# Patient Record
Sex: Female | Born: 1980 | Race: Black or African American | Hispanic: No | Marital: Single | Smoking: Never smoker
Health system: Southern US, Community
[De-identification: ages and names within clinical notes are randomized; demographics above are authoritative.]

## PROBLEM LIST (undated history)

## (undated) DIAGNOSIS — M1990 Unspecified osteoarthritis, unspecified site: Secondary | ICD-10-CM

## (undated) DIAGNOSIS — B029 Zoster without complications: Secondary | ICD-10-CM

## (undated) DIAGNOSIS — F32A Depression, unspecified: Secondary | ICD-10-CM

## (undated) DIAGNOSIS — F411 Generalized anxiety disorder: Secondary | ICD-10-CM

## (undated) DIAGNOSIS — F329 Major depressive disorder, single episode, unspecified: Secondary | ICD-10-CM

## (undated) DIAGNOSIS — F419 Anxiety disorder, unspecified: Secondary | ICD-10-CM

## (undated) DIAGNOSIS — G709 Myoneural disorder, unspecified: Secondary | ICD-10-CM

## (undated) DIAGNOSIS — B20 Human immunodeficiency virus [HIV] disease: Secondary | ICD-10-CM

## (undated) DIAGNOSIS — E119 Type 2 diabetes mellitus without complications: Secondary | ICD-10-CM

## (undated) DIAGNOSIS — K219 Gastro-esophageal reflux disease without esophagitis: Secondary | ICD-10-CM

## (undated) DIAGNOSIS — F319 Bipolar disorder, unspecified: Secondary | ICD-10-CM

## (undated) DIAGNOSIS — J45909 Unspecified asthma, uncomplicated: Secondary | ICD-10-CM

## (undated) DIAGNOSIS — D649 Anemia, unspecified: Secondary | ICD-10-CM

## (undated) DIAGNOSIS — Z21 Asymptomatic human immunodeficiency virus [HIV] infection status: Secondary | ICD-10-CM

## (undated) DIAGNOSIS — A6009 Herpesviral infection of other urogenital tract: Secondary | ICD-10-CM

## (undated) DIAGNOSIS — T7840XA Allergy, unspecified, initial encounter: Secondary | ICD-10-CM

## (undated) DIAGNOSIS — R51 Headache: Secondary | ICD-10-CM

## (undated) HISTORY — DX: Anemia, unspecified: D64.9

## (undated) HISTORY — DX: Zoster without complications: B02.9

## (undated) HISTORY — DX: Allergy, unspecified, initial encounter: T78.40XA

## (undated) HISTORY — PX: COLON SURGERY: SHX602

## (undated) HISTORY — DX: Human immunodeficiency virus (HIV) disease: B20

## (undated) HISTORY — DX: Unspecified osteoarthritis, unspecified site: M19.90

## (undated) HISTORY — DX: Asymptomatic human immunodeficiency virus (hiv) infection status: Z21

---

## 2000-01-09 ENCOUNTER — Emergency Department (HOSPITAL_COMMUNITY): Admission: EM | Admit: 2000-01-09 | Discharge: 2000-01-09 | Payer: Self-pay

## 2000-01-22 ENCOUNTER — Emergency Department (HOSPITAL_COMMUNITY): Admission: EM | Admit: 2000-01-22 | Discharge: 2000-01-22 | Payer: Self-pay | Admitting: Emergency Medicine

## 2000-01-22 ENCOUNTER — Encounter: Payer: Self-pay | Admitting: Emergency Medicine

## 2000-03-14 ENCOUNTER — Emergency Department (HOSPITAL_COMMUNITY): Admission: EM | Admit: 2000-03-14 | Discharge: 2000-03-14 | Payer: Self-pay | Admitting: Emergency Medicine

## 2000-03-15 ENCOUNTER — Encounter: Payer: Self-pay | Admitting: Emergency Medicine

## 2000-05-24 ENCOUNTER — Emergency Department (HOSPITAL_COMMUNITY): Admission: EM | Admit: 2000-05-24 | Discharge: 2000-05-24 | Payer: Self-pay | Admitting: Emergency Medicine

## 2000-05-27 ENCOUNTER — Emergency Department (HOSPITAL_COMMUNITY): Admission: EM | Admit: 2000-05-27 | Discharge: 2000-05-27 | Payer: Self-pay | Admitting: Emergency Medicine

## 2000-06-06 ENCOUNTER — Emergency Department (HOSPITAL_COMMUNITY): Admission: EM | Admit: 2000-06-06 | Discharge: 2000-06-06 | Payer: Self-pay | Admitting: Emergency Medicine

## 2000-09-11 ENCOUNTER — Emergency Department (HOSPITAL_COMMUNITY): Admission: EM | Admit: 2000-09-11 | Discharge: 2000-09-11 | Payer: Self-pay | Admitting: *Deleted

## 2000-10-12 ENCOUNTER — Emergency Department (HOSPITAL_COMMUNITY): Admission: EM | Admit: 2000-10-12 | Discharge: 2000-10-12 | Payer: Self-pay | Admitting: Emergency Medicine

## 2001-01-23 ENCOUNTER — Emergency Department (HOSPITAL_COMMUNITY): Admission: EM | Admit: 2001-01-23 | Discharge: 2001-01-23 | Payer: Self-pay | Admitting: Emergency Medicine

## 2001-01-23 ENCOUNTER — Encounter: Payer: Self-pay | Admitting: *Deleted

## 2001-03-14 ENCOUNTER — Inpatient Hospital Stay (HOSPITAL_COMMUNITY): Admission: AD | Admit: 2001-03-14 | Discharge: 2001-03-14 | Payer: Self-pay | Admitting: Obstetrics and Gynecology

## 2001-03-14 ENCOUNTER — Encounter: Payer: Self-pay | Admitting: *Deleted

## 2001-04-03 ENCOUNTER — Inpatient Hospital Stay (HOSPITAL_COMMUNITY): Admission: AD | Admit: 2001-04-03 | Discharge: 2001-04-03 | Payer: Self-pay | Admitting: Obstetrics and Gynecology

## 2001-04-28 ENCOUNTER — Inpatient Hospital Stay (HOSPITAL_COMMUNITY): Admission: AD | Admit: 2001-04-28 | Discharge: 2001-04-28 | Payer: Self-pay | Admitting: *Deleted

## 2001-05-22 ENCOUNTER — Inpatient Hospital Stay (HOSPITAL_COMMUNITY): Admission: AD | Admit: 2001-05-22 | Discharge: 2001-05-22 | Payer: Self-pay | Admitting: Obstetrics and Gynecology

## 2001-08-01 ENCOUNTER — Inpatient Hospital Stay (HOSPITAL_COMMUNITY): Admission: AD | Admit: 2001-08-01 | Discharge: 2001-08-01 | Payer: Self-pay | Admitting: Obstetrics and Gynecology

## 2001-09-11 ENCOUNTER — Inpatient Hospital Stay (HOSPITAL_COMMUNITY): Admission: AD | Admit: 2001-09-11 | Discharge: 2001-09-15 | Payer: Self-pay | Admitting: Obstetrics and Gynecology

## 2001-10-30 ENCOUNTER — Ambulatory Visit (HOSPITAL_COMMUNITY): Admission: RE | Admit: 2001-10-30 | Discharge: 2001-10-30 | Payer: Self-pay | Admitting: Obstetrics and Gynecology

## 2001-10-30 ENCOUNTER — Encounter: Payer: Self-pay | Admitting: Obstetrics and Gynecology

## 2001-11-01 ENCOUNTER — Other Ambulatory Visit: Admission: RE | Admit: 2001-11-01 | Discharge: 2001-11-01 | Payer: Self-pay | Admitting: Obstetrics and Gynecology

## 2001-11-30 ENCOUNTER — Emergency Department (HOSPITAL_COMMUNITY): Admission: EM | Admit: 2001-11-30 | Discharge: 2001-11-30 | Payer: Self-pay | Admitting: Emergency Medicine

## 2001-12-12 ENCOUNTER — Inpatient Hospital Stay (HOSPITAL_COMMUNITY): Admission: AD | Admit: 2001-12-12 | Discharge: 2001-12-12 | Payer: Self-pay | Admitting: Obstetrics and Gynecology

## 2002-01-29 ENCOUNTER — Emergency Department (HOSPITAL_COMMUNITY): Admission: EM | Admit: 2002-01-29 | Discharge: 2002-01-29 | Payer: Self-pay | Admitting: Emergency Medicine

## 2002-04-07 ENCOUNTER — Emergency Department (HOSPITAL_COMMUNITY): Admission: EM | Admit: 2002-04-07 | Discharge: 2002-04-07 | Payer: Self-pay | Admitting: Emergency Medicine

## 2002-11-03 ENCOUNTER — Emergency Department (HOSPITAL_COMMUNITY): Admission: EM | Admit: 2002-11-03 | Discharge: 2002-11-04 | Payer: Self-pay | Admitting: Emergency Medicine

## 2003-09-10 ENCOUNTER — Emergency Department (HOSPITAL_COMMUNITY): Admission: EM | Admit: 2003-09-10 | Discharge: 2003-09-10 | Payer: Self-pay | Admitting: Emergency Medicine

## 2004-03-13 ENCOUNTER — Emergency Department (HOSPITAL_COMMUNITY): Admission: EM | Admit: 2004-03-13 | Discharge: 2004-03-13 | Payer: Self-pay | Admitting: Emergency Medicine

## 2004-04-10 ENCOUNTER — Emergency Department (HOSPITAL_COMMUNITY): Admission: EM | Admit: 2004-04-10 | Discharge: 2004-04-10 | Payer: Self-pay | Admitting: Emergency Medicine

## 2004-05-06 ENCOUNTER — Emergency Department (HOSPITAL_COMMUNITY): Admission: EM | Admit: 2004-05-06 | Discharge: 2004-05-06 | Payer: Self-pay | Admitting: Emergency Medicine

## 2004-07-13 ENCOUNTER — Emergency Department (HOSPITAL_COMMUNITY): Admission: EM | Admit: 2004-07-13 | Discharge: 2004-07-13 | Payer: Self-pay | Admitting: Family Medicine

## 2004-09-23 ENCOUNTER — Ambulatory Visit: Payer: Self-pay | Admitting: Family Medicine

## 2004-09-28 ENCOUNTER — Ambulatory Visit: Payer: Self-pay | Admitting: Family Medicine

## 2004-11-10 ENCOUNTER — Emergency Department (HOSPITAL_COMMUNITY): Admission: EM | Admit: 2004-11-10 | Discharge: 2004-11-10 | Payer: Self-pay | Admitting: *Deleted

## 2004-12-15 ENCOUNTER — Ambulatory Visit: Payer: Self-pay | Admitting: Family Medicine

## 2005-01-13 ENCOUNTER — Emergency Department (HOSPITAL_COMMUNITY): Admission: EM | Admit: 2005-01-13 | Discharge: 2005-01-13 | Payer: Self-pay | Admitting: Family Medicine

## 2005-02-25 ENCOUNTER — Ambulatory Visit: Payer: Self-pay | Admitting: Family Medicine

## 2005-03-04 ENCOUNTER — Emergency Department (HOSPITAL_COMMUNITY): Admission: EM | Admit: 2005-03-04 | Discharge: 2005-03-04 | Payer: Self-pay | Admitting: Emergency Medicine

## 2005-03-19 ENCOUNTER — Ambulatory Visit: Payer: Self-pay | Admitting: Family Medicine

## 2005-04-01 ENCOUNTER — Emergency Department (HOSPITAL_COMMUNITY): Admission: EM | Admit: 2005-04-01 | Discharge: 2005-04-01 | Payer: Self-pay | Admitting: Emergency Medicine

## 2005-05-24 ENCOUNTER — Emergency Department (HOSPITAL_COMMUNITY): Admission: EM | Admit: 2005-05-24 | Discharge: 2005-05-24 | Payer: Self-pay | Admitting: Emergency Medicine

## 2005-06-23 ENCOUNTER — Emergency Department (HOSPITAL_COMMUNITY): Admission: EM | Admit: 2005-06-23 | Discharge: 2005-06-23 | Payer: Self-pay | Admitting: Emergency Medicine

## 2005-10-23 ENCOUNTER — Emergency Department (HOSPITAL_COMMUNITY): Admission: EM | Admit: 2005-10-23 | Discharge: 2005-10-23 | Payer: Self-pay | Admitting: Family Medicine

## 2005-11-27 ENCOUNTER — Emergency Department (HOSPITAL_COMMUNITY): Admission: EM | Admit: 2005-11-27 | Discharge: 2005-11-27 | Payer: Self-pay | Admitting: Family Medicine

## 2006-02-23 ENCOUNTER — Emergency Department (HOSPITAL_COMMUNITY): Admission: EM | Admit: 2006-02-23 | Discharge: 2006-02-23 | Payer: Self-pay | Admitting: Family Medicine

## 2006-03-17 ENCOUNTER — Emergency Department (HOSPITAL_COMMUNITY): Admission: EM | Admit: 2006-03-17 | Discharge: 2006-03-17 | Payer: Self-pay | Admitting: Family Medicine

## 2006-06-30 ENCOUNTER — Emergency Department (HOSPITAL_COMMUNITY): Admission: EM | Admit: 2006-06-30 | Discharge: 2006-06-30 | Payer: Self-pay | Admitting: Family Medicine

## 2006-07-18 ENCOUNTER — Emergency Department (HOSPITAL_COMMUNITY): Admission: EM | Admit: 2006-07-18 | Discharge: 2006-07-18 | Payer: Self-pay

## 2006-09-13 ENCOUNTER — Emergency Department (HOSPITAL_COMMUNITY): Admission: EM | Admit: 2006-09-13 | Discharge: 2006-09-13 | Payer: Self-pay | Admitting: Family Medicine

## 2006-09-16 ENCOUNTER — Emergency Department (HOSPITAL_COMMUNITY): Admission: EM | Admit: 2006-09-16 | Discharge: 2006-09-17 | Payer: Self-pay | Admitting: Emergency Medicine

## 2006-10-11 ENCOUNTER — Emergency Department (HOSPITAL_COMMUNITY): Admission: EM | Admit: 2006-10-11 | Discharge: 2006-10-11 | Payer: Self-pay | Admitting: Family Medicine

## 2006-10-26 ENCOUNTER — Emergency Department (HOSPITAL_COMMUNITY): Admission: EM | Admit: 2006-10-26 | Discharge: 2006-10-26 | Payer: Self-pay | Admitting: Family Medicine

## 2006-11-10 ENCOUNTER — Encounter: Admission: RE | Admit: 2006-11-10 | Discharge: 2006-11-10 | Payer: Self-pay | Admitting: Internal Medicine

## 2006-11-10 ENCOUNTER — Ambulatory Visit: Payer: Self-pay | Admitting: Internal Medicine

## 2006-11-10 DIAGNOSIS — L509 Urticaria, unspecified: Secondary | ICD-10-CM | POA: Insufficient documentation

## 2006-11-10 DIAGNOSIS — B20 Human immunodeficiency virus [HIV] disease: Secondary | ICD-10-CM | POA: Insufficient documentation

## 2006-11-15 DIAGNOSIS — J45991 Cough variant asthma: Secondary | ICD-10-CM | POA: Insufficient documentation

## 2006-11-25 ENCOUNTER — Ambulatory Visit: Payer: Self-pay | Admitting: Internal Medicine

## 2006-11-25 DIAGNOSIS — J309 Allergic rhinitis, unspecified: Secondary | ICD-10-CM | POA: Insufficient documentation

## 2006-11-25 DIAGNOSIS — M549 Dorsalgia, unspecified: Secondary | ICD-10-CM | POA: Insufficient documentation

## 2006-11-25 DIAGNOSIS — J45909 Unspecified asthma, uncomplicated: Secondary | ICD-10-CM | POA: Insufficient documentation

## 2006-11-25 DIAGNOSIS — F329 Major depressive disorder, single episode, unspecified: Secondary | ICD-10-CM

## 2006-11-25 DIAGNOSIS — F32A Depression, unspecified: Secondary | ICD-10-CM | POA: Insufficient documentation

## 2006-11-25 DIAGNOSIS — J3089 Other allergic rhinitis: Secondary | ICD-10-CM | POA: Insufficient documentation

## 2006-11-30 ENCOUNTER — Telehealth: Payer: Self-pay | Admitting: Internal Medicine

## 2006-12-06 ENCOUNTER — Telehealth: Payer: Self-pay | Admitting: Internal Medicine

## 2006-12-07 ENCOUNTER — Ambulatory Visit: Payer: Self-pay | Admitting: Internal Medicine

## 2006-12-07 DIAGNOSIS — K649 Unspecified hemorrhoids: Secondary | ICD-10-CM | POA: Insufficient documentation

## 2006-12-12 ENCOUNTER — Emergency Department (HOSPITAL_COMMUNITY): Admission: EM | Admit: 2006-12-12 | Discharge: 2006-12-12 | Payer: Self-pay | Admitting: Family Medicine

## 2006-12-14 ENCOUNTER — Encounter: Payer: Self-pay | Admitting: Internal Medicine

## 2006-12-28 ENCOUNTER — Encounter: Admission: RE | Admit: 2006-12-28 | Discharge: 2006-12-28 | Payer: Self-pay | Admitting: Internal Medicine

## 2006-12-28 ENCOUNTER — Ambulatory Visit: Payer: Self-pay | Admitting: Internal Medicine

## 2006-12-29 ENCOUNTER — Emergency Department (HOSPITAL_COMMUNITY): Admission: EM | Admit: 2006-12-29 | Discharge: 2006-12-29 | Payer: Self-pay | Admitting: Family Medicine

## 2006-12-29 ENCOUNTER — Telehealth: Payer: Self-pay | Admitting: Internal Medicine

## 2007-01-04 ENCOUNTER — Ambulatory Visit: Payer: Self-pay | Admitting: Internal Medicine

## 2007-01-04 ENCOUNTER — Telehealth: Payer: Self-pay | Admitting: Internal Medicine

## 2007-01-04 DIAGNOSIS — J019 Acute sinusitis, unspecified: Secondary | ICD-10-CM | POA: Insufficient documentation

## 2007-01-23 ENCOUNTER — Telehealth: Payer: Self-pay

## 2007-01-24 ENCOUNTER — Ambulatory Visit: Payer: Self-pay | Admitting: Internal Medicine

## 2007-01-24 DIAGNOSIS — R350 Frequency of micturition: Secondary | ICD-10-CM | POA: Insufficient documentation

## 2007-01-24 LAB — CONVERTED CEMR LAB
Blood in Urine, dipstick: NEGATIVE
Glucose, Urine, Semiquant: NEGATIVE
Ketones, urine, test strip: NEGATIVE
Nitrite: NEGATIVE
Protein, U semiquant: NEGATIVE
Specific Gravity, Urine: 1.02
Urobilinogen, UA: 1
WBC Urine, dipstick: NEGATIVE
pH: 7.5

## 2007-01-25 ENCOUNTER — Telehealth: Payer: Self-pay | Admitting: Internal Medicine

## 2007-01-30 ENCOUNTER — Encounter: Payer: Self-pay | Admitting: Internal Medicine

## 2007-01-31 ENCOUNTER — Telehealth: Payer: Self-pay | Admitting: Internal Medicine

## 2007-02-01 ENCOUNTER — Telehealth: Payer: Self-pay | Admitting: Internal Medicine

## 2007-02-03 ENCOUNTER — Emergency Department (HOSPITAL_COMMUNITY): Admission: EM | Admit: 2007-02-03 | Discharge: 2007-02-03 | Payer: Self-pay | Admitting: Family Medicine

## 2007-02-07 ENCOUNTER — Emergency Department (HOSPITAL_COMMUNITY): Admission: EM | Admit: 2007-02-07 | Discharge: 2007-02-07 | Payer: Self-pay | Admitting: Emergency Medicine

## 2007-02-09 ENCOUNTER — Emergency Department (HOSPITAL_COMMUNITY): Admission: EM | Admit: 2007-02-09 | Discharge: 2007-02-09 | Payer: Self-pay | Admitting: Emergency Medicine

## 2007-02-21 ENCOUNTER — Telehealth: Payer: Self-pay | Admitting: Internal Medicine

## 2007-02-22 ENCOUNTER — Telehealth: Payer: Self-pay | Admitting: Internal Medicine

## 2007-03-06 ENCOUNTER — Telehealth: Payer: Self-pay | Admitting: Internal Medicine

## 2007-03-08 ENCOUNTER — Telehealth: Payer: Self-pay | Admitting: Internal Medicine

## 2007-03-12 ENCOUNTER — Emergency Department (HOSPITAL_COMMUNITY): Admission: EM | Admit: 2007-03-12 | Discharge: 2007-03-12 | Payer: Self-pay | Admitting: Emergency Medicine

## 2007-04-15 ENCOUNTER — Emergency Department (HOSPITAL_COMMUNITY): Admission: EM | Admit: 2007-04-15 | Discharge: 2007-04-15 | Payer: Self-pay | Admitting: Emergency Medicine

## 2007-07-12 ENCOUNTER — Emergency Department (HOSPITAL_COMMUNITY): Admission: EM | Admit: 2007-07-12 | Discharge: 2007-07-12 | Payer: Self-pay | Admitting: Family Medicine

## 2007-07-20 ENCOUNTER — Emergency Department (HOSPITAL_COMMUNITY): Admission: EM | Admit: 2007-07-20 | Discharge: 2007-07-20 | Payer: Self-pay | Admitting: Emergency Medicine

## 2007-08-14 ENCOUNTER — Encounter: Payer: Self-pay | Admitting: Internal Medicine

## 2007-08-16 ENCOUNTER — Encounter (INDEPENDENT_AMBULATORY_CARE_PROVIDER_SITE_OTHER): Payer: Self-pay | Admitting: *Deleted

## 2007-09-24 ENCOUNTER — Emergency Department (HOSPITAL_COMMUNITY): Admission: EM | Admit: 2007-09-24 | Discharge: 2007-09-24 | Payer: Self-pay | Admitting: Family Medicine

## 2007-09-24 ENCOUNTER — Other Ambulatory Visit: Payer: Self-pay | Admitting: Internal Medicine

## 2007-10-03 ENCOUNTER — Encounter (INDEPENDENT_AMBULATORY_CARE_PROVIDER_SITE_OTHER): Payer: Self-pay | Admitting: *Deleted

## 2007-10-09 ENCOUNTER — Emergency Department (HOSPITAL_COMMUNITY): Admission: EM | Admit: 2007-10-09 | Discharge: 2007-10-09 | Payer: Self-pay | Admitting: Emergency Medicine

## 2007-10-26 ENCOUNTER — Encounter: Payer: Self-pay | Admitting: Internal Medicine

## 2007-10-27 ENCOUNTER — Encounter: Payer: Self-pay | Admitting: Internal Medicine

## 2007-10-27 LAB — CONVERTED CEMR LAB: Pap Smear: ABNORMAL

## 2008-02-02 ENCOUNTER — Inpatient Hospital Stay (HOSPITAL_COMMUNITY): Admission: EM | Admit: 2008-02-02 | Discharge: 2008-02-08 | Payer: Self-pay | Admitting: Emergency Medicine

## 2008-02-04 ENCOUNTER — Ambulatory Visit: Payer: Self-pay | Admitting: Internal Medicine

## 2008-02-11 ENCOUNTER — Emergency Department (HOSPITAL_COMMUNITY): Admission: EM | Admit: 2008-02-11 | Discharge: 2008-02-12 | Payer: Self-pay | Admitting: Emergency Medicine

## 2008-02-14 ENCOUNTER — Encounter: Payer: Self-pay | Admitting: Internal Medicine

## 2008-03-01 ENCOUNTER — Ambulatory Visit: Payer: Self-pay | Admitting: Internal Medicine

## 2008-03-04 ENCOUNTER — Ambulatory Visit: Payer: Self-pay | Admitting: Internal Medicine

## 2008-03-04 ENCOUNTER — Encounter: Admission: RE | Admit: 2008-03-04 | Discharge: 2008-03-04 | Payer: Self-pay | Admitting: Internal Medicine

## 2008-03-04 LAB — CONVERTED CEMR LAB
ALT: 19 units/L (ref 0–35)
AST: 20 units/L (ref 0–37)
Albumin: 3.8 g/dL (ref 3.5–5.2)
Alkaline Phosphatase: 92 units/L (ref 39–117)
BUN: 17 mg/dL (ref 6–23)
Basophils Absolute: 0 10*3/uL (ref 0.0–0.1)
Basophils Relative: 0 % (ref 0–1)
CO2: 20 meq/L (ref 19–32)
Calcium: 8.3 mg/dL — ABNORMAL LOW (ref 8.4–10.5)
Chloride: 108 meq/L (ref 96–112)
Creatinine, Ser: 0.69 mg/dL (ref 0.40–1.20)
Eosinophils Absolute: 0.1 10*3/uL (ref 0.0–0.7)
Eosinophils Relative: 3 % (ref 0–5)
Glucose, Bld: 76 mg/dL (ref 70–99)
HCT: 35 % — ABNORMAL LOW (ref 36.0–46.0)
Hemoglobin: 11.5 g/dL — ABNORMAL LOW (ref 12.0–15.0)
Lymphocytes Relative: 25 % (ref 12–46)
Lymphs Abs: 1 10*3/uL (ref 0.7–4.0)
MCHC: 32.9 g/dL (ref 30.0–36.0)
MCV: 97 fL (ref 78.0–100.0)
Monocytes Absolute: 0.5 10*3/uL (ref 0.1–1.0)
Monocytes Relative: 13 % — ABNORMAL HIGH (ref 3–12)
Neutro Abs: 2.3 10*3/uL (ref 1.7–7.7)
Neutrophils Relative %: 59 % (ref 43–77)
Platelets: 305 10*3/uL (ref 150–400)
Potassium: 3.6 meq/L (ref 3.5–5.3)
RBC: 3.61 M/uL — ABNORMAL LOW (ref 3.87–5.11)
RDW: 17.8 % — ABNORMAL HIGH (ref 11.5–15.5)
Sodium: 139 meq/L (ref 135–145)
Total Bilirubin: 0.5 mg/dL (ref 0.3–1.2)
Total Protein: 6.8 g/dL (ref 6.0–8.3)
WBC: 3.8 10*3/uL — ABNORMAL LOW (ref 4.0–10.5)

## 2008-03-20 ENCOUNTER — Ambulatory Visit: Payer: Self-pay | Admitting: Internal Medicine

## 2008-03-21 ENCOUNTER — Encounter: Payer: Self-pay | Admitting: Internal Medicine

## 2008-04-15 ENCOUNTER — Telehealth: Payer: Self-pay | Admitting: Internal Medicine

## 2008-04-18 ENCOUNTER — Ambulatory Visit: Payer: Self-pay | Admitting: Internal Medicine

## 2008-04-18 LAB — CONVERTED CEMR LAB
HIV 1 RNA Quant: 33300 copies/mL — ABNORMAL HIGH (ref <50)
HIV-1 RNA Quant, Log: 4.52 — ABNORMAL HIGH (ref <1.70)

## 2008-04-29 LAB — CONVERTED CEMR LAB
ALT: 17 units/L (ref 0–35)
AST: 24 units/L (ref 0–37)
Albumin: 3.7 g/dL (ref 3.5–5.2)
Alkaline Phosphatase: 82 units/L (ref 39–117)
BUN: 11 mg/dL (ref 6–23)
Basophils Absolute: 0 10*3/uL (ref 0.0–0.1)
Basophils Relative: 0 % (ref 0–1)
CO2: 24 meq/L (ref 19–32)
Calcium: 8.8 mg/dL (ref 8.4–10.5)
Chloride: 107 meq/L (ref 96–112)
Creatinine, Ser: 0.71 mg/dL (ref 0.40–1.20)
Eosinophils Absolute: 0.2 10*3/uL (ref 0.0–0.7)
Eosinophils Relative: 4 % (ref 0–5)
Glucose, Bld: 102 mg/dL — ABNORMAL HIGH (ref 70–99)
HCT: 34.7 % — ABNORMAL LOW (ref 36.0–46.0)
Hemoglobin: 10.8 g/dL — ABNORMAL LOW (ref 12.0–15.0)
Lymphocytes Relative: 18 % (ref 12–46)
Lymphs Abs: 0.9 10*3/uL (ref 0.7–4.0)
MCHC: 31.1 g/dL (ref 30.0–36.0)
MCV: 103 fL — ABNORMAL HIGH (ref 78.0–100.0)
Monocytes Absolute: 0.4 10*3/uL (ref 0.1–1.0)
Monocytes Relative: 8 % (ref 3–12)
Neutro Abs: 3.5 10*3/uL (ref 1.7–7.7)
Neutrophils Relative %: 69 % (ref 43–77)
Platelets: 404 10*3/uL — ABNORMAL HIGH (ref 150–400)
Potassium: 3 meq/L — ABNORMAL LOW (ref 3.5–5.3)
RBC: 3.37 M/uL — ABNORMAL LOW (ref 3.87–5.11)
RDW: 17.1 % — ABNORMAL HIGH (ref 11.5–15.5)
Sodium: 142 meq/L (ref 135–145)
Total Bilirubin: 0.5 mg/dL (ref 0.3–1.2)
Total Protein: 7.1 g/dL (ref 6.0–8.3)
WBC: 5.1 10*3/uL (ref 4.0–10.5)

## 2008-05-01 ENCOUNTER — Telehealth: Payer: Self-pay | Admitting: Internal Medicine

## 2008-05-20 ENCOUNTER — Encounter: Payer: Self-pay | Admitting: Internal Medicine

## 2008-05-20 ENCOUNTER — Telehealth: Payer: Self-pay | Admitting: Internal Medicine

## 2008-05-20 DIAGNOSIS — E876 Hypokalemia: Secondary | ICD-10-CM | POA: Insufficient documentation

## 2008-06-13 ENCOUNTER — Ambulatory Visit: Payer: Self-pay | Admitting: Internal Medicine

## 2008-06-13 LAB — CONVERTED CEMR LAB
BUN: 13 mg/dL (ref 6–23)
CO2: 21 meq/L (ref 19–32)
Calcium: 8.8 mg/dL (ref 8.4–10.5)
Chloride: 109 meq/L (ref 96–112)
Creatinine, Ser: 0.74 mg/dL (ref 0.40–1.20)
Glucose, Bld: 83 mg/dL (ref 70–99)
Potassium: 4 meq/L (ref 3.5–5.3)
Sodium: 142 meq/L (ref 135–145)

## 2008-06-24 ENCOUNTER — Telehealth: Payer: Self-pay | Admitting: Internal Medicine

## 2008-08-23 ENCOUNTER — Emergency Department (HOSPITAL_COMMUNITY): Admission: EM | Admit: 2008-08-23 | Discharge: 2008-08-23 | Payer: Self-pay | Admitting: Family Medicine

## 2008-09-23 ENCOUNTER — Telehealth (INDEPENDENT_AMBULATORY_CARE_PROVIDER_SITE_OTHER): Payer: Self-pay | Admitting: *Deleted

## 2008-09-25 ENCOUNTER — Ambulatory Visit: Payer: Self-pay | Admitting: Internal Medicine

## 2008-09-25 DIAGNOSIS — R21 Rash and other nonspecific skin eruption: Secondary | ICD-10-CM | POA: Insufficient documentation

## 2008-09-25 LAB — CONVERTED CEMR LAB
ALT: 36 units/L — ABNORMAL HIGH (ref 0–35)
AST: 83 units/L — ABNORMAL HIGH (ref 0–37)
Albumin: 3.6 g/dL (ref 3.5–5.2)
Alkaline Phosphatase: 131 units/L — ABNORMAL HIGH (ref 39–117)
BUN: 19 mg/dL (ref 6–23)
Basophils Absolute: 0 10*3/uL (ref 0.0–0.1)
Basophils Relative: 0 % (ref 0–1)
CO2: 20 meq/L (ref 19–32)
Calcium: 8.4 mg/dL (ref 8.4–10.5)
Chloride: 110 meq/L (ref 96–112)
Creatinine, Ser: 0.54 mg/dL (ref 0.40–1.20)
Eosinophils Absolute: 0.1 10*3/uL (ref 0.0–0.7)
Eosinophils Relative: 2 % (ref 0–5)
Glucose, Bld: 84 mg/dL (ref 70–99)
HCT: 35 % — ABNORMAL LOW (ref 36.0–46.0)
Hemoglobin: 10.9 g/dL — ABNORMAL LOW (ref 12.0–15.0)
Lymphocytes Relative: 19 % (ref 12–46)
Lymphs Abs: 0.8 10*3/uL (ref 0.7–4.0)
MCHC: 31.1 g/dL (ref 30.0–36.0)
MCV: 97.8 fL (ref 78.0–100.0)
Monocytes Absolute: 0.5 10*3/uL (ref 0.1–1.0)
Monocytes Relative: 12 % (ref 3–12)
Neutro Abs: 2.8 10*3/uL (ref 1.7–7.7)
Neutrophils Relative %: 66 % (ref 43–77)
Platelets: 279 10*3/uL (ref 150–400)
Potassium: 3.5 meq/L (ref 3.5–5.3)
RBC: 3.58 M/uL — ABNORMAL LOW (ref 3.87–5.11)
RDW: 14.6 % (ref 11.5–15.5)
Sodium: 141 meq/L (ref 135–145)
Total Bilirubin: 0.3 mg/dL (ref 0.3–1.2)
Total Protein: 7.6 g/dL (ref 6.0–8.3)
WBC: 4.2 10*3/uL (ref 4.0–10.5)

## 2008-09-27 LAB — CONVERTED CEMR LAB
HIV 1 RNA Quant: 504000 copies/mL — ABNORMAL HIGH (ref <48)
HIV-1 RNA Quant, Log: 5.7 — ABNORMAL HIGH (ref <1.68)

## 2008-09-30 ENCOUNTER — Encounter (INDEPENDENT_AMBULATORY_CARE_PROVIDER_SITE_OTHER): Payer: Self-pay | Admitting: Licensed Clinical Social Worker

## 2008-09-30 ENCOUNTER — Encounter: Payer: Self-pay | Admitting: Internal Medicine

## 2008-09-30 LAB — CONVERTED CEMR LAB

## 2008-10-11 ENCOUNTER — Ambulatory Visit: Payer: Self-pay | Admitting: Internal Medicine

## 2008-10-21 ENCOUNTER — Encounter: Payer: Self-pay | Admitting: Infectious Disease

## 2008-10-29 ENCOUNTER — Ambulatory Visit: Payer: Self-pay | Admitting: Internal Medicine

## 2009-01-14 ENCOUNTER — Encounter: Payer: Self-pay | Admitting: Internal Medicine

## 2009-01-15 ENCOUNTER — Ambulatory Visit: Payer: Self-pay | Admitting: Internal Medicine

## 2009-01-15 LAB — CONVERTED CEMR LAB
ALT: 17 units/L (ref 0–35)
AST: 29 units/L (ref 0–37)
Albumin: 4.2 g/dL (ref 3.5–5.2)
Alkaline Phosphatase: 105 units/L (ref 39–117)
BUN: 22 mg/dL (ref 6–23)
Basophils Absolute: 0 10*3/uL (ref 0.0–0.1)
Basophils Relative: 1 % (ref 0–1)
CO2: 22 meq/L (ref 19–32)
Calcium: 9 mg/dL (ref 8.4–10.5)
Chloride: 104 meq/L (ref 96–112)
Creatinine, Ser: 1.1 mg/dL (ref 0.40–1.20)
Eosinophils Absolute: 0.1 10*3/uL (ref 0.0–0.7)
Eosinophils Relative: 4 % (ref 0–5)
GFR calc Af Amer: >60 mL/min (ref >60)
GFR calc non Af Amer: 59 mL/min — ABNORMAL LOW (ref >60)
Glucose, Bld: 93 mg/dL (ref 70–99)
HCT: 37.1 % (ref 36.0–46.0)
HIV 1 RNA Quant: 236 copies/mL — ABNORMAL HIGH (ref <48)
HIV-1 RNA Quant, Log: 2.37 — ABNORMAL HIGH (ref <1.68)
Hemoglobin: 13 g/dL (ref 12.0–15.0)
Lymphocytes Relative: 44 % (ref 12–46)
Lymphs Abs: 1.6 10*3/uL (ref 0.7–4.0)
MCHC: 35 g/dL (ref 30.0–36.0)
MCV: 106.9 fL — ABNORMAL HIGH (ref 78.0–100.0)
Monocytes Absolute: 0.5 10*3/uL (ref 0.1–1.0)
Monocytes Relative: 13 % — ABNORMAL HIGH (ref 3–12)
Neutro Abs: 1.4 10*3/uL — ABNORMAL LOW (ref 1.7–7.7)
Neutrophils Relative %: 39 % — ABNORMAL LOW (ref 43–77)
Platelets: 286 10*3/uL (ref 150–400)
Potassium: 4.8 meq/L (ref 3.5–5.3)
RBC: 3.47 M/uL — ABNORMAL LOW (ref 3.87–5.11)
RDW: 18.4 % — ABNORMAL HIGH (ref 11.5–15.5)
Sodium: 136 meq/L (ref 135–145)
Total Bilirubin: 0.4 mg/dL (ref 0.3–1.2)
Total Protein: 8.1 g/dL (ref 6.0–8.3)
WBC: 3.7 10*3/uL — ABNORMAL LOW (ref 4.0–10.5)

## 2009-01-28 ENCOUNTER — Encounter (INDEPENDENT_AMBULATORY_CARE_PROVIDER_SITE_OTHER): Payer: Self-pay | Admitting: *Deleted

## 2009-01-31 ENCOUNTER — Ambulatory Visit: Payer: Self-pay | Admitting: Internal Medicine

## 2009-01-31 DIAGNOSIS — M545 Low back pain, unspecified: Secondary | ICD-10-CM | POA: Insufficient documentation

## 2009-01-31 DIAGNOSIS — B029 Zoster without complications: Secondary | ICD-10-CM

## 2009-01-31 HISTORY — DX: Zoster without complications: B02.9

## 2009-04-18 ENCOUNTER — Ambulatory Visit: Payer: Self-pay | Admitting: Internal Medicine

## 2009-04-18 DIAGNOSIS — B351 Tinea unguium: Secondary | ICD-10-CM | POA: Insufficient documentation

## 2009-04-18 LAB — CONVERTED CEMR LAB
ALT: 15 units/L (ref 0–35)
AST: 20 units/L (ref 0–37)
Albumin: 4 g/dL (ref 3.5–5.2)
Alkaline Phosphatase: 82 units/L (ref 39–117)
Basophils Absolute: 0 10*3/uL (ref 0.0–0.1)
Basophils Relative: 1 % (ref 0–1)
Chloride: 110 meq/L (ref 96–112)
HIV 1 RNA Quant: 256 copies/mL — ABNORMAL HIGH (ref <48)
Hemoglobin: 12.5 g/dL (ref 12.0–15.0)
MCHC: 35.3 g/dL (ref 30.0–36.0)
Monocytes Absolute: 0.7 10*3/uL (ref 0.1–1.0)
Neutro Abs: 2.8 10*3/uL (ref 1.7–7.7)
Neutrophils Relative %: 53 % (ref 43–77)
Platelets: 300 10*3/uL (ref 150–400)
Potassium: 4.3 meq/L (ref 3.5–5.3)
RDW: 13.3 % (ref 11.5–15.5)
Sodium: 141 meq/L (ref 135–145)
Total CK: 236 units/L — ABNORMAL HIGH (ref 7–177)
Total Protein: 7.1 g/dL (ref 6.0–8.3)

## 2009-06-03 ENCOUNTER — Ambulatory Visit: Payer: Self-pay | Admitting: Internal Medicine

## 2009-06-03 LAB — CONVERTED CEMR LAB
Nitrite: NEGATIVE
pH: 5

## 2009-09-17 ENCOUNTER — Telehealth: Payer: Self-pay | Admitting: Internal Medicine

## 2009-11-03 ENCOUNTER — Emergency Department (HOSPITAL_COMMUNITY): Admission: EM | Admit: 2009-11-03 | Discharge: 2009-11-03 | Payer: Self-pay | Admitting: Emergency Medicine

## 2009-12-18 ENCOUNTER — Ambulatory Visit: Payer: Self-pay | Admitting: Internal Medicine

## 2009-12-18 LAB — CONVERTED CEMR LAB
BUN: 20 mg/dL (ref 6–23)
CO2: 21 meq/L (ref 19–32)
Calcium: 9.4 mg/dL (ref 8.4–10.5)
Chloride: 106 meq/L (ref 96–112)
Creatinine, Ser: 1.16 mg/dL (ref 0.40–1.20)
Eosinophils Absolute: 0.1 10*3/uL (ref 0.0–0.7)
Eosinophils Relative: 1 % (ref 0–5)
HCT: 41.3 % (ref 36.0–46.0)
HIV 1 RNA Quant: <48 copies/mL (ref <48)
Lymphs Abs: 3 10*3/uL (ref 0.7–4.0)
MCV: 116 fL — ABNORMAL HIGH (ref 78.0–100.0)
Monocytes Relative: 5 % (ref 3–12)
RBC: 3.56 M/uL — ABNORMAL LOW (ref 3.87–5.11)
WBC: 9.3 10*3/uL (ref 4.0–10.5)

## 2009-12-23 ENCOUNTER — Encounter: Admission: RE | Admit: 2009-12-23 | Discharge: 2010-01-22 | Payer: Self-pay | Admitting: Orthopedic Surgery

## 2010-01-13 ENCOUNTER — Encounter: Payer: Self-pay | Admitting: Internal Medicine

## 2010-05-08 ENCOUNTER — Telehealth (INDEPENDENT_AMBULATORY_CARE_PROVIDER_SITE_OTHER): Payer: Self-pay | Admitting: *Deleted

## 2010-06-09 ENCOUNTER — Emergency Department (HOSPITAL_COMMUNITY): Admission: EM | Admit: 2010-06-09 | Discharge: 2010-06-09 | Payer: Self-pay | Admitting: Emergency Medicine

## 2010-06-22 ENCOUNTER — Encounter: Payer: Self-pay | Admitting: Internal Medicine

## 2010-06-30 ENCOUNTER — Ambulatory Visit: Payer: Self-pay | Admitting: Adult Health

## 2010-06-30 ENCOUNTER — Encounter: Payer: Self-pay | Admitting: Internal Medicine

## 2010-06-30 LAB — CONVERTED CEMR LAB
AST: 13 units/L (ref 0–37)
Albumin: 3.8 g/dL (ref 3.5–5.2)
Alkaline Phosphatase: 94 units/L (ref 39–117)
BUN: 16 mg/dL (ref 6–23)
Basophils Absolute: 0 10*3/uL (ref 0.0–0.1)
Basophils Relative: 0 % (ref 0–1)
Eosinophils Absolute: 0.3 10*3/uL (ref 0.0–0.7)
HIV 1 RNA Quant: 21 copies/mL (ref <20)
Hemoglobin: 14 g/dL (ref 12.0–15.0)
MCHC: 34.4 g/dL (ref 30.0–36.0)
MCV: 113.4 fL — ABNORMAL HIGH (ref 78.0–100.0)
Monocytes Absolute: 0.9 10*3/uL (ref 0.1–1.0)
Neutro Abs: 11.6 10*3/uL — ABNORMAL HIGH (ref 1.7–7.7)
Potassium: 4 meq/L (ref 3.5–5.3)
RDW: 13.9 % (ref 11.5–15.5)
Sodium: 138 meq/L (ref 135–145)
Total Protein: 6.3 g/dL (ref 6.0–8.3)

## 2010-07-02 ENCOUNTER — Telehealth: Payer: Self-pay | Admitting: Internal Medicine

## 2010-07-07 ENCOUNTER — Encounter (INDEPENDENT_AMBULATORY_CARE_PROVIDER_SITE_OTHER): Payer: Self-pay | Admitting: *Deleted

## 2010-07-21 ENCOUNTER — Emergency Department (HOSPITAL_COMMUNITY)
Admission: EM | Admit: 2010-07-21 | Discharge: 2010-07-21 | Payer: Self-pay | Source: Home / Self Care | Admitting: Emergency Medicine

## 2010-07-23 ENCOUNTER — Emergency Department (HOSPITAL_COMMUNITY): Admission: EM | Admit: 2010-07-23 | Discharge: 2010-06-22 | Payer: Self-pay | Admitting: Emergency Medicine

## 2010-09-05 ENCOUNTER — Encounter: Payer: Self-pay | Admitting: Internal Medicine

## 2010-09-13 LAB — CONVERTED CEMR LAB
ALT: 20 units/L (ref 0–35)
ALT: 33 units/L (ref 0–35)
AST: 26 units/L (ref 0–37)
AST: 35 units/L (ref 0–37)
Albumin: 3.9 g/dL (ref 3.5–5.2)
Albumin: 4.1 g/dL (ref 3.5–5.2)
Alkaline Phosphatase: 62 units/L (ref 39–117)
Alkaline Phosphatase: 64 units/L (ref 39–117)
BUN: 12 mg/dL (ref 6–23)
BUN: 13 mg/dL (ref 6–23)
Basophils Absolute: 0 10*3/uL (ref 0.0–0.1)
Basophils Absolute: 0.1 10*3/uL (ref 0.0–0.1)
Basophils Relative: 1 % (ref 0–1)
Basophils Relative: 1 % (ref 0–1)
Bilirubin Urine: NEGATIVE
CD4 Count: 20 microliters
CD4 Count: 60 microliters
CD4 T Cell Abs: <10
CO2: 19 meq/L (ref 19–32)
CO2: 23 meq/L (ref 19–32)
Calcium: 8.7 mg/dL (ref 8.4–10.5)
Calcium: 8.9 mg/dL (ref 8.4–10.5)
Chlamydia, Swab/Urine, PCR: NEGATIVE
Chloride: 102 meq/L (ref 96–112)
Chloride: 107 meq/L (ref 96–112)
Creatinine, Ser: 0.93 mg/dL (ref 0.40–1.20)
Creatinine, Ser: 0.95 mg/dL (ref 0.40–1.20)
Eosinophils Absolute: 0.1 10*3/uL (ref 0.0–0.7)
Eosinophils Absolute: 0.2 10*3/uL (ref 0.0–0.7)
Eosinophils Relative: 2 % (ref 0–5)
Eosinophils Relative: 5 % (ref 0–5)
GC Probe Amp, Urine: NEGATIVE
Glucose, Bld: 116 mg/dL — ABNORMAL HIGH (ref 70–99)
Glucose, Bld: 85 mg/dL (ref 70–99)
HCT: 36.7 % (ref 36.0–46.0)
HCT: 36.9 % (ref 36.0–46.0)
HCV Ab: NEGATIVE
HIV 1 RNA Quant: 183 copies/mL — ABNORMAL HIGH (ref <50)
HIV 1 RNA Quant: 202000 copies/mL — ABNORMAL HIGH (ref <50)
HIV 1 RNA Quant: 391000 copies/mL
HIV-1 RNA Quant, Log: 2.26 — ABNORMAL HIGH (ref <1.70)
HIV-1 RNA Quant, Log: 5.31 — ABNORMAL HIGH (ref <1.70)
HIV-1 antibody: POSITIVE — AB
HIV-2 Ab: NEGATIVE
HIV: REACTIVE
Hemoglobin, Urine: NEGATIVE
Hemoglobin: 12 g/dL (ref 12.0–15.0)
Hemoglobin: 12.2 g/dL (ref 12.0–15.0)
Hep B Core Total Ab: NEGATIVE
Hep B S Ab: NEGATIVE
Hepatitis B Surface Ag: NEGATIVE
Ketones, ur: NEGATIVE mg/dL
Lymphocytes Relative: 20 % (ref 12–46)
Lymphocytes Relative: 34 % (ref 12–46)
Lymphs Abs: 0.8 10*3/uL (ref 0.7–3.3)
Lymphs Abs: 2.3 10*3/uL (ref 0.7–3.3)
MCHC: 32.7 g/dL (ref 30.0–36.0)
MCHC: 33.1 g/dL (ref 30.0–36.0)
MCV: 100.5 fL — ABNORMAL HIGH (ref 78.0–100.0)
MCV: 101.9 fL — ABNORMAL HIGH (ref 78.0–100.0)
Monocytes Absolute: 0.5 10*3/uL (ref 0.2–0.7)
Monocytes Absolute: 0.6 10*3/uL (ref 0.2–0.7)
Monocytes Relative: 11 % (ref 3–11)
Monocytes Relative: 9 % (ref 3–11)
Neutro Abs: 2.7 10*3/uL (ref 1.7–7.7)
Neutro Abs: 3.6 10*3/uL (ref 1.7–7.7)
Neutrophils Relative %: 54 % (ref 43–77)
Neutrophils Relative %: 64 % (ref 43–77)
Nitrite: NEGATIVE
Platelets: 306 10*3/uL (ref 150–400)
Platelets: 324 10*3/uL (ref 150–400)
Potassium: 3.6 meq/L (ref 3.5–5.3)
Potassium: 4 meq/L (ref 3.5–5.3)
Protein, ur: NEGATIVE mg/dL
RBC / HPF: NONE SEEN (ref <3)
RBC: 3.62 M/uL — ABNORMAL LOW (ref 3.87–5.11)
RBC: 3.65 M/uL — ABNORMAL LOW (ref 3.87–5.11)
RDW: 13.9 % (ref 11.5–14.0)
RDW: 14.6 % — ABNORMAL HIGH (ref 11.5–14.0)
Sodium: 135 meq/L (ref 135–145)
Sodium: 138 meq/L (ref 135–145)
Specific Gravity, Urine: 1.018 (ref 1.005–1.03)
Total Bilirubin: 0.6 mg/dL (ref 0.3–1.2)
Total Bilirubin: 0.6 mg/dL (ref 0.3–1.2)
Total Protein: 8 g/dL (ref 6.0–8.3)
Total Protein: 8.7 g/dL — ABNORMAL HIGH (ref 6.0–8.3)
Urine Glucose: NEGATIVE mg/dL
Urobilinogen, UA: 1 (ref 0.0–1.0)
WBC: 4.2 10*3/uL (ref 4.0–10.5)
WBC: 6.7 10*3/uL (ref 4.0–10.5)
pH: 6 (ref 5.0–8.0)

## 2010-09-15 NOTE — Assessment & Plan Note (Signed)
Summary: 2wk f/u/vs   CC:  follow-up visit and needs lab work.  History of Present Illness: Pt doing well.  she has been taking her meds every day. She is concerned about gaining weight.  She is going to PT for back pain.  Preventive Screening-Counseling & Management  Alcohol-Tobacco     Alcohol drinks/day: 0     Alcohol type: vodka     Smoking Status: never     Passive Smoke Exposure: yes  Caffeine-Diet-Exercise     Caffeine use/day: yes, soda     Does Patient Exercise: yes     Type of exercise: walking     Exercise (avg: min/session): <30     Times/week: <3  Hep-HIV-STD-Contraception     HIV Risk: no  Safety-Violence-Falls     Seat Belt Use: yes      Sexual History:  currently monogamous.        Drug Use:  never.        Blood Transfusions:  no.        Travel History:  no.     Updated Prior Medication List: ALBUTEROL 90 MCG/ACT AERS (ALBUTEROL)  COLACE 100 MG CAPS (DOCUSATE SODIUM) Take 1 tablet by mouth once a day FLONASE 50 MCG/ACT SUSP (FLUTICASONE PROPIONATE) one spray twice a day SINGULAIR 10 MG  TABS (MONTELUKAST SODIUM) Take 1 tablet by mouth once a day LOMOTIL 2.5-0.025 MG  TABS (DIPHENOXYLATE-ATROPINE) as directed * KAPIDEX  (DEXLANSOPRAZOLE) as directed ENSURE  LIQD (NUTRITIONAL SUPPLEMENTS) Drink one can daily CLEOCIN-T 1 % SOLN (CLINDAMYCIN PHOSPHATE) apply thin film two times a day * ATIVAN as directed by MH CYMBALTA 20 MG CPEP (DULOXETINE HCL) Take 1 tablet by mouth once a day ISENTRESS 400 MG TABS (RALTEGRAVIR POTASSIUM) Take 1 tablet by mouth two times a day VIREAD 300 MG TABS (TENOFOVIR DISOPROXIL FUMARATE) Take 1 tablet by mouth once a day COMBIVIR 150-300 MG TABS (LAMIVUDINE-ZIDOVUDINE) Take 1 tablet by mouth two times a day VALTREX 500 MG TABS (VALACYCLOVIR HCL) take 1 tablet two times a day ZYRTEC ALLERGY 10 MG CAPS (CETIRIZINE HCL) one tablet one time a day NAPROSYN 375 MG TABS (NAPROXEN) take one tablet two times a day FLEXERIL 10 MG TABS  (CYCLOBENZAPRINE HCL) take one tablet three times a day as needed for pain  Current Allergies (reviewed today): ! BACTRIM Past History:  Past Medical History: Last updated: 11/25/2006 Allergic rhinitis Asthma HIV disease  Social History: Sexual History:  currently monogamous  Review of Systems  The patient denies anorexia, fever, and weight loss.    Vital Signs:  Patient profile:   30 year old female Menstrual status:  irregular Height:      65 inches (165.10 cm) Weight:      180.0 pounds (81.82 kg) BMI:     30.06 Temp:     98.1 degrees F (36.72 degrees C) oral Pulse rate:   82 / minute BP sitting:   132 / 91  (right arm)  Vitals Entered By: Wendall Mola CMA Duncan Dull) (Dec 18, 2009 9:13 AM) CC: follow-up visit, needs lab work Is Patient Diabetic? No Pain Assessment Patient in pain? yes     Location: back Intensity: 6 Type: sharp Onset of pain  Constant Nutritional Status BMI of > 30 = obese Nutritional Status Detail appetite "on and off"  Does patient need assistance? Functional Status Self care Ambulation Normal Comments one missed dose of meds since last visit   Physical Exam  General:  alert, well-developed, well-nourished, and  well-hydrated.   Head:  normocephalic and atraumatic.   Mouth:  pharynx pink and moist.  no thrush  Lungs:  normal breath sounds.      Impression & Recommendations:  Problem # 1:  HIV DISEASE (ICD-042) Will obtain labs today.  She will call for results.  She will continue her current meds. F/u in 3 months. Diagnostics Reviewed:  HIV: REACTIVE (11/10/2006)   HIV-Western blot: Positive (11/10/2006)   CD4: 380 (04/22/2009)   WBC: 5.3 (04/18/2009)   Hgb: 12.5 (04/18/2009)   HCT: 35.4 (04/18/2009)   Platelets: 300 (04/18/2009) HIV genotype: See Comment (09/30/2008)   HIV-1 RNA: 256 (04/18/2009)   HBSAg: NEG (11/10/2006)  Medications Added to Medication List This Visit: 1)  Zyrtec Allergy 10 Mg Caps (Cetirizine hcl) ....  One tablet one time a day 2)  Naprosyn 375 Mg Tabs (Naproxen) .... Take one tablet two times a day 3)  Flexeril 10 Mg Tabs (Cyclobenzaprine hcl) .... Take one tablet three times a day as needed for pain  Other Orders: Est. Patient Level III (14782) Future Orders: T-CD4SP ([facility]) (CD4SP) ... 03/18/2010 T-HIV Viral Load [id number]) ... 03/18/2010 T-Comprehensive Metabolic Panel [id number]) ... 03/18/2010 T-CBC w/Diff ([id number]) ... 03/18/2010 T-RPR (Syphilis) [phone]) ... 03/18/2010  Patient Instructions: 1)  Please schedule a follow-up appointment in 3 months, 2 weeks after labs.

## 2010-09-15 NOTE — Assessment & Plan Note (Signed)
Summary: sinus cong/drainage. asthma problems/tkk   CC:  pt. c/o chest and nasal congestion, cough x 1 month, missed labs , and and body aches.  History of Present Illness: 1week h/o sinus congestion and progressively worsening asthma attacks, with cough, SOB, malaise, diffuse arthralgias, myalgias, and pleuritic CP.  Unable to sleep in supine position.  Albuterol inhaler not working at this time after multiple attempts.  Preventive Screening-Counseling & Management  Alcohol-Tobacco     Alcohol drinks/day: 0     Alcohol type: vodka     Smoking Status: never     Passive Smoke Exposure: yes  Caffeine-Diet-Exercise     Caffeine use/day: yes, soda     Does Patient Exercise: yes     Type of exercise: walking     Exercise (avg: min/session): <30     Times/week: <3  Hep-HIV-STD-Contraception     HIV Risk: no  Safety-Violence-Falls     Seat Belt Use: yes      Sexual History:  currently monogamous.        Drug Use:  never.        Blood Transfusions:  no.        Travel History:  no.    Comments: pt. declined condoms   Current Allergies (reviewed today): ! BACTRIM Past History:  Past medical, surgical, family and social histories (including risk factors) reviewed for relevance to current acute and chronic problems.  Past Medical History: Reviewed history from 11/25/2006 and no changes required. Allergic rhinitis Asthma HIV disease  Family History: Reviewed history and no changes required.  Social History: Reviewed history from 11/25/2006 and no changes required. Never Smoked Alcohol use-yes  Review of Systems General:  Complains of chills, fatigue, fever, malaise, sleep disorder, sweats, and weakness; denies loss of appetite and weight loss. ENT:  Complains of nasal congestion, postnasal drainage, and sinus pressure; denies decreased hearing, difficulty swallowing, ear discharge, earache, hoarseness, nosebleeds, ringing in ears, and sore throat. CV:  Denies bluish  discoloration of lips or nails, chest pain or discomfort, difficulty breathing at night, difficulty breathing while lying down, fainting, fatigue, leg cramps with exertion, lightheadness, near fainting, palpitations, shortness of breath with exertion, swelling of feet, swelling of hands, and weight gain. Resp:  Complains of chest discomfort, cough, pleuritic, shortness of breath, sputum productive, and wheezing; denies chest pain with inspiration, coughing up blood, excessive snoring, hypersomnolence, and morning headaches. GI:  Denies abdominal pain, bloody stools, change in bowel habits, constipation, dark tarry stools, diarrhea, excessive appetite, gas, hemorrhoids, indigestion, loss of appetite, nausea, vomiting, vomiting blood, and yellowish skin color. MS:  Diffuse arthralgias and myalgias. Allergy:  Complains of hives or rash and sneezing.  Vital Signs:  Patient profile:   30 year old female Menstrual status:  irregular Height:      65 inches (165.10 cm) Weight:      180.8 pounds (82.18 kg) BMI:     30.20 O2 Sat:      100 % on Room air Temp:     98.6 degrees F (37.00 degrees C) oral Pulse rate:   111 / minute BP sitting:   119 / 81  (left arm)  Vitals Entered By: Wendall Mola CMA Duncan Dull) (June 30, 2010 10:32 AM)  O2 Flow:  Room air CC: pt. c/o chest and nasal congestion, cough x 1 month, missed labs , and body aches Is Patient Diabetic? No Pain Assessment Patient in pain? yes     Location: back Intensity: 7 Type: aching Onset of  pain  Constant Nutritional Status BMI of > 30 = obese Nutritional Status Detail appetite "good"  Have you ever been in a relationship where you felt threatened, hurt or afraid?Unable to ask   Does patient need assistance? Functional Status Self care Ambulation Normal Comments no missed doses of HAART meds per pt.   Physical Exam  General:  alert, appropriate dress, cooperative to examination, good hygiene, overweight-appearing,  and uncomfortable-appearing.   Head:  Normocephalic and atraumatic without obvious abnormalities. No apparent alopecia or balding. Ears:  Slight TM bulging. Nose:  mucosal erythema and mucosal edema.   Mouth:  halitosis, pharyngeal erythema, postnasal drip, and low-lying uvula.   Neck:  No deformities, masses, or tenderness noted. Chest Wall:  No deformities, masses, or tenderness noted. Lungs:  tachypnic, R decreased breath sounds, R wheezes, L decreased breath sounds, and L wheezes.   Heart:  Normal rate and regular rhythm. S1 and S2 normal without gallop, murmur, click, rub or other extra sounds. Abdomen:  Bowel sounds positive,abdomen soft and non-tender without masses, organomegaly or hernias noted. Msk:  No deformity or scoliosis noted of thoracic or lumbar spine.   Skin:  Intact without suspicious lesions or rashes Cervical Nodes:  Shoddy a&P Cx LN's Axillary Nodes:  Shoddy LN's Psych:  Cognition and judgment appear intact. Alert and cooperative with normal attention span and concentration. No apparent delusions, illusions, hallucinations   Impression & Recommendations:  Problem # 1:  SINUSITIS, ACUTE NOS (ICD-461.9)  Most likely primary source of progressive symptoms Continue flonase treatment, and Zyrtec therapy Will add Augmentin to regimen Her updated medication list for this problem includes:    Flonase 50 Mcg/act Susp (Fluticasone propionate) ..... One spray twice a day    Amoxicillin-pot Clavulanate 875-125 Mg Tabs (Amoxicillin-pot clavulanate) .Marland Kitchen... 1 tablet by mouth every 12 hours until gone  Orders: Est. Patient Level III (57846)  Problem # 2:  ASTHMA (ICD-493.90)  Reactive airway secondary to #1 Continue albuterol MDI with spacer as rescue.  May do 1 inhalation followed by second in 1 minute, and repeat in 15-20 min.  If no relief go to ER. Add Medrol dose pak Add Qvar Course of Augmentin 875mg  two times a day for 10 days to cover susceptable URI organisms. If  no improvement by end of week to contact clinic F/U with Dr. Philipp Deputy as planned and /or PRN. Her updated medication list for this problem includes:    Albuterol 90 Mcg/act Aers (Albuterol)    Singulair 10 Mg Tabs (Montelukast sodium) .Marland Kitchen... Take 1 tablet by mouth once a day    Methylprednisolone (pak) 4 Mg Tabs (Methylprednisolone) ..... Use as directed    Qvar 40 Mcg/act Aers (Beclomethasone dipropionate) .Marland Kitchen... 1 puff every 12 hours as directed  Orders: Est. Patient Level III (96295)  Medications Added to Medication List This Visit: 1)  Methylprednisolone (pak) 4 Mg Tabs (Methylprednisolone) .... Use as directed 2)  Amoxicillin-pot Clavulanate 875-125 Mg Tabs (Amoxicillin-pot clavulanate) .Marland Kitchen.. 1 tablet by mouth every 12 hours until gone 3)  Qvar 40 Mcg/act Aers (Beclomethasone dipropionate) .Marland Kitchen.. 1 puff every 12 hours as directed  Other Orders: T-Hgb A1C (in-house) (28413KG)  Prescriptions: QVAR 40 MCG/ACT AERS (BECLOMETHASONE DIPROPIONATE) 1 puff every 12 hours as directed  #1 inhaler x 2   Entered and Authorized by:   Talmadge Chad NP   Signed by:   Talmadge Chad NP on 06/30/2010   Method used:   Print then Give to Patient   RxID:   [id number] AMOXICILLIN-POT CLAVULANATE  875-125 MG TABS (AMOXICILLIN-POT CLAVULANATE) 1 tablet by mouth every 12 hours until gone  #20 x 0   Entered and Authorized by:   Talmadge Chad NP   Signed by:   Talmadge Chad NP on 06/30/2010   Method used:   Print then Give to Patient   RxID:   [id number] METHYLPREDNISOLONE (PAK) 4 MG TABS (METHYLPREDNISOLONE) use as directed  #1 pak x 0   Entered and Authorized by:   Talmadge Chad NP   Signed by:   Talmadge Chad NP on 06/30/2010   Method used:   Print then Give to Patient   RxID:   [id number]    Immunization History:  Influenza Immunization History:    Influenza:  historical (06/01/2010)

## 2010-09-15 NOTE — Progress Notes (Signed)
Summary: Question on length of therapy  Phone Note From Pharmacy   Caller: [facility] Reason: wants to know how long therapy will be for the Lamisil Summary of Call: [facility] wants to know how long you would like patient to be on the Lamisil? Initial call taken by: Paulo Fruit  BS,CPht II,MPH,  September 17, 2009 10:03 AM  Follow-up for Phone Call        3 months Follow-up by: Yisroel Ramming MD,  September 17, 2009 10:12 AM  Additional Follow-up for Phone Call Additional follow up Details #1::        [facility] notified verbally Additional Follow-up by: Paulo Fruit  BS,CPht II,MPH,  September 19, 2009 5:03 PM

## 2010-09-15 NOTE — Progress Notes (Signed)
Summary: PPD  Phone Note Outgoing Call   Call placed by: Annice Pih Summary of Call: Pt. needs PPD at next office visit Initial call taken by: Wendall Mola CMA Duncan Dull),  May 08, 2010 12:45 PM

## 2010-09-15 NOTE — Miscellaneous (Signed)
  Clinical Lists Changes  Orders: Added new Test order of T-CBC w/Diff [phone]) - Signed Added new Test order of T-CD4SP [facility]) (CD4SP) - Signed Added new Test order of T-Comprehensive Metabolic Panel [id number]) - Signed Added new Test order of T-HIV Viral Load [id number]) - Signed     Process Orders Check Orders Results:     [facility]: ABN not required for this insurance Tests Sent for requisitioning (June 22, 2010 11:30 AM):     06/25/2010: [facility] -- T-CBC w/Diff [[id number]] (signed)     06/25/2010: [facility] -- T-Comprehensive Metabolic Panel [[id number]] (signed)     06/25/2010: [facility] -- T-HIV Viral Load [phone] (signed)

## 2010-09-15 NOTE — Miscellaneous (Signed)
Summary: [facility]  [facility] [place] [facility]   Imported By: Florinda Marker 01/14/2010 09:51:51  _____________________________________________________________________  External Attachment:    Type:   Image     Comment:   External Document

## 2010-09-15 NOTE — Miscellaneous (Signed)
  Clinical Lists Changes  Observations: Added new observation of YEARAIDSPOS: 2008  (07/07/2010 16:19) Added new observation of HIV STATUS: CDC-defined AIDS  (07/07/2010 16:19)

## 2010-09-15 NOTE — Progress Notes (Signed)
Summary: high blood sugar  Phone Note Call from Patient   Caller: Patient Summary of Call: Pt. is c/o high blood sugars since being put on prednisone when seen by Brad.  She said her sugar has been 199 and is requesting to be put on Metformin temporarily.  She has six more days of the prednisone. Initial call taken by: Wendall Mola CMA Duncan Dull),  July 02, 2010 4:08 PM  Follow-up for Phone Call        f/u with PCP if no PCP she needs an appt with me Follow-up by: Yisroel Ramming MD,  July 03, 2010 11:11 AM  Additional Follow-up for Phone Call Additional follow up Details #1::        Pt. does have a PCP she will follow with.  And instructed to watch her diet more carefully. Additional Follow-up by: Wendall Mola CMA Duncan Dull),  July 03, 2010 12:49 PM

## 2010-10-27 LAB — URINE CULTURE
Colony Count: NO GROWTH
Culture  Setup Time: 201111061227

## 2010-10-27 LAB — T-HELPER CELL (CD4) - (RCID CLINIC ONLY): CD4 T Cell Abs: 740 uL (ref 400–2700)

## 2010-10-27 LAB — URINALYSIS, ROUTINE W REFLEX MICROSCOPIC
Bilirubin Urine: NEGATIVE
Glucose, UA: NEGATIVE mg/dL
Hgb urine dipstick: NEGATIVE
Ketones, ur: NEGATIVE mg/dL
pH: 6 (ref 5.0–8.0)

## 2010-10-27 LAB — GC/CHLAMYDIA PROBE AMP, GENITAL: Chlamydia, DNA Probe: NEGATIVE

## 2010-10-27 LAB — WET PREP, GENITAL
Trich, Wet Prep: NONE SEEN
WBC, Wet Prep HPF POC: NONE SEEN
Yeast Wet Prep HPF POC: NONE SEEN

## 2010-10-27 LAB — URINE MICROSCOPIC-ADD ON

## 2010-11-02 ENCOUNTER — Emergency Department (HOSPITAL_COMMUNITY)
Admission: EM | Admit: 2010-11-02 | Discharge: 2010-11-03 | Disposition: A | Discharge status: Home / Self Care | Payer: Medicaid Other | Attending: Emergency Medicine | Admitting: Emergency Medicine

## 2010-11-02 ENCOUNTER — Emergency Department (HOSPITAL_COMMUNITY): Payer: Medicaid Other

## 2010-11-02 DIAGNOSIS — J3489 Other specified disorders of nose and nasal sinuses: Secondary | ICD-10-CM | POA: Insufficient documentation

## 2010-11-02 DIAGNOSIS — R059 Cough, unspecified: Secondary | ICD-10-CM | POA: Insufficient documentation

## 2010-11-02 DIAGNOSIS — J069 Acute upper respiratory infection, unspecified: Secondary | ICD-10-CM | POA: Insufficient documentation

## 2010-11-02 DIAGNOSIS — J45909 Unspecified asthma, uncomplicated: Secondary | ICD-10-CM | POA: Insufficient documentation

## 2010-11-02 DIAGNOSIS — Z21 Asymptomatic human immunodeficiency virus [HIV] infection status: Secondary | ICD-10-CM | POA: Insufficient documentation

## 2010-11-02 DIAGNOSIS — Z79899 Other long term (current) drug therapy: Secondary | ICD-10-CM | POA: Insufficient documentation

## 2010-11-02 DIAGNOSIS — R05 Cough: Secondary | ICD-10-CM | POA: Insufficient documentation

## 2010-11-02 DIAGNOSIS — IMO0001 Reserved for inherently not codable concepts without codable children: Secondary | ICD-10-CM | POA: Insufficient documentation

## 2010-11-23 LAB — T-HELPER CELL (CD4) - (RCID CLINIC ONLY): CD4 % Helper T Cell: 7 % — ABNORMAL LOW (ref 33–55)

## 2010-11-26 ENCOUNTER — Encounter (HOSPITAL_COMMUNITY)
Admission: RE | Admit: 2010-11-26 | Discharge: 2010-11-26 | Disposition: A | Discharge status: Home / Self Care | Payer: Medicaid Other | Source: Ambulatory Visit | Attending: Obstetrics & Gynecology | Admitting: Obstetrics & Gynecology

## 2010-11-26 DIAGNOSIS — Z01812 Encounter for preprocedural laboratory examination: Secondary | ICD-10-CM | POA: Insufficient documentation

## 2010-11-26 DIAGNOSIS — Z01818 Encounter for other preprocedural examination: Secondary | ICD-10-CM | POA: Insufficient documentation

## 2010-11-26 LAB — CBC
HCT: 42.4 % (ref 36.0–46.0)
Hemoglobin: 14.8 g/dL (ref 12.0–15.0)
RBC: 3.84 MIL/uL — ABNORMAL LOW (ref 3.87–5.11)
WBC: 9.4 10*3/uL (ref 4.0–10.5)

## 2010-11-26 LAB — APTT: aPTT: 33 seconds (ref 24–37)

## 2010-11-26 LAB — SURGICAL PCR SCREEN: MRSA, PCR: NEGATIVE

## 2010-11-26 LAB — PROTIME-INR: INR: 0.87 (ref 0.00–1.49)

## 2010-12-01 LAB — T-HELPER CELL (CD4) - (RCID CLINIC ONLY): CD4 T Cell Abs: 20 uL — ABNORMAL LOW (ref 400–2700)

## 2010-12-02 ENCOUNTER — Ambulatory Visit (HOSPITAL_COMMUNITY)
Admission: RE | Admit: 2010-12-02 | Discharge: 2010-12-03 | Disposition: A | Discharge status: Home / Self Care | Payer: Medicaid Other | Source: Ambulatory Visit | Attending: Obstetrics & Gynecology | Admitting: Obstetrics & Gynecology

## 2010-12-02 ENCOUNTER — Other Ambulatory Visit: Payer: Self-pay | Admitting: Obstetrics & Gynecology

## 2010-12-02 DIAGNOSIS — D72829 Elevated white blood cell count, unspecified: Secondary | ICD-10-CM | POA: Insufficient documentation

## 2010-12-02 DIAGNOSIS — Z01812 Encounter for preprocedural laboratory examination: Secondary | ICD-10-CM | POA: Insufficient documentation

## 2010-12-02 DIAGNOSIS — Z01818 Encounter for other preprocedural examination: Secondary | ICD-10-CM | POA: Insufficient documentation

## 2010-12-02 DIAGNOSIS — D279 Benign neoplasm of unspecified ovary: Secondary | ICD-10-CM | POA: Insufficient documentation

## 2010-12-03 LAB — CBC
MCH: 37.9 pg — ABNORMAL HIGH (ref 26.0–34.0)
MCHC: 34.5 g/dL (ref 30.0–36.0)
MCV: 109.4 fL — ABNORMAL HIGH (ref 78.0–100.0)
MCV: 109.7 fL — ABNORMAL HIGH (ref 78.0–100.0)
Platelets: 297 10*3/uL (ref 150–400)
Platelets: 281 10*3/uL (ref 150–400)
RBC: 3.51 MIL/uL — ABNORMAL LOW (ref 3.87–5.11)
RDW: 12.9 % (ref 11.5–15.5)
RDW: 12.8 % (ref 11.5–15.5)
WBC: 14.6 10*3/uL — ABNORMAL HIGH (ref 4.0–10.5)

## 2010-12-03 LAB — DIFFERENTIAL
Eosinophils Absolute: 0 10*3/uL (ref 0.0–0.7)
Eosinophils Relative: 0 % (ref 0–5)
Lymphs Abs: 2.6 10*3/uL (ref 0.7–4.0)

## 2010-12-05 NOTE — Op Note (Signed)
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  PROCEDURE:  Operative laparoscopy and ovarian cystectomy.  SURGEON:  Roseanna Rainbow, MD  ANESTHESIA:  General endotracheal anesthesia.  ESTIMATED BLOOD LOSS:  Minimal.  COMPLICATIONS:  None.  FINDINGS:  The right ovary had a smooth wall cyst and was sitting in the anterior cul-de-sac.  The cyst was approximately 6 cm in diameter.  The remainder of the pelvic anatomy was normal in appearance.  During the excision, the cyst was entered, and sebaceous material was noted in the contents.  There were places in the cyst wall where there were no tissue planes and the cyst was adherent to the overlying ovarian cortex.  This was most pronounced near the base of the cyst.  PROCEDURE:  The patient was taken to the operating room with an IV running.  She was placed in the semi lithotomy position in [place] stirrups in place and prepped and draped in the usual sterile fashion. After time-out had been completed, a sterile speculum was placed in the patient's vagina.  The anterior lip of the cervix was then grasped with single-tooth tenaculum.  Kahn cannula was then advanced into the uterus and secured to the single-tooth tenaculum.  The infraumbilical skin was then infiltrated with 0.25% Marcaine.  The 1 cm incision was then made and 10-mm trocar and sleeve were then advanced into the abdomen using the Optiview under direct visualization.  Bilateral 5-mm lower quadrant ports and a 10-mm  suprapubic port were then all placed under direct visualization.  The abdomen had been insufflated with CO2 gas prior to Zeus ancillary port placement.  The survey of the pelvic viscera revealed the above findings.  An incision line was then made over the ovarian cyst using monopolar cautery and Endo shears.  The wall of the cyst was then separated from the wall of the ovary using both blunt and sharp dissection.  Again during this dissection, the disk was entered. The excision was completed using sharp dissection with the endoscopic scissors and monopolar cautery.  The cyst bed was inspected and found to be hemostatic.  At this point, the abdomen were copiously irrigated with 3 liters of crystalloid.  The Trendelenburg position was reversed to facilitate suction, the irrigation fluid.  The ovarian cyst was then placed in an EndoCatch bag and the abdomen.  All the ports were then removed.  The subcutaneous layers with 10 mm incisions were reapproximated with interrupted figure-of-eight sutures of 0 Vicryl on a UR-6 needle.  All of the skin incisions were closed in a subcuticular fashion using 4-0 Monocryl.  Steri-Strips were then applied.  The Kahn cannula was then removed with minimal bleeding noted from the cervix.  At the close of the procedure, the instrument and pack counts were said to be correct x2.  The patient was taken to the PACU awake and in stable condition.     Roseanna Rainbow, M.D.     Judee Clara  D:  12/02/2010  T:  12/03/2010  Job:  [id number]  Electronically Signed by [name] M.D. on 12/05/2010 02:01:31 PM

## 2010-12-25 ENCOUNTER — Inpatient Hospital Stay (HOSPITAL_COMMUNITY): Payer: Medicaid Other

## 2010-12-25 ENCOUNTER — Inpatient Hospital Stay (HOSPITAL_COMMUNITY)
Admission: AD | Admit: 2010-12-25 | Discharge: 2010-12-25 | Disposition: A | Discharge status: Home / Self Care | Payer: Medicaid Other | Source: Ambulatory Visit | Attending: Obstetrics & Gynecology | Admitting: Obstetrics & Gynecology

## 2010-12-25 DIAGNOSIS — N390 Urinary tract infection, site not specified: Secondary | ICD-10-CM | POA: Insufficient documentation

## 2010-12-25 DIAGNOSIS — R109 Unspecified abdominal pain: Secondary | ICD-10-CM | POA: Insufficient documentation

## 2010-12-25 LAB — CBC
HCT: 37.1 % (ref 36.0–46.0)
Hemoglobin: 12.7 g/dL (ref 12.0–15.0)
RBC: 3.39 MIL/uL — ABNORMAL LOW (ref 3.87–5.11)
WBC: 10.5 10*3/uL (ref 4.0–10.5)

## 2010-12-25 LAB — URINALYSIS, ROUTINE W REFLEX MICROSCOPIC
Ketones, ur: 15 mg/dL — AB
Protein, ur: 30 mg/dL — AB
Urobilinogen, UA: 1 mg/dL (ref 0.0–1.0)

## 2010-12-25 LAB — URINE MICROSCOPIC-ADD ON

## 2010-12-29 NOTE — Discharge Summary (Signed)
[name], Carol NO.:  [id number]   MEDICAL RECORD NO.:  [id number]          PATIENT TYPE:  INP   LOCATION:  1226                         FACILITY:  [facility]   PHYSICIAN:  [name], M.D.    DATE OF BIRTH:  12-30-1980   DATE OF ADMISSION:  02/02/2008  DATE OF DISCHARGE:                               DISCHARGE SUMMARY   Interim Discharge Summary   DATE OF DISCHARGE:  To be determined.   DISCHARGE DIAGNOSES:  1. Acute respiratory distress and hypoxic respiratory failure      secondary to bilateral pneumonia.  The patient is being treated      empirically for Pneumocystis carinii pneumonia and H1N1 influenza.  2. Suspected superimposed H1N1 influenza.  3. Human immunodeficiency virus.  CD-4 count and viral loads are      pending.  Currently, the patient is not being treated with      antiretroviral therapy.  4. Diarrhea, back pain, headache, all resolved.  Clostridium difficile      toxin negative.  5. Acute sphenoid sinusitis.  6. Hyperglycemia secondary to steroids.  7. Hypokalemia.   DISCHARGE MEDICATIONS:  To be determined at the time of hospital  discharge.   CONSULTATIONS:  1. Critical care medicine physician Casimiro Needle B. Carol Sires, MD.  2. Infectious diseases physician [name], M.D.   PROCEDURE PERFORMED:  1. Chest x-ray on February 06, 2008.  The results revealed improved      aeration with resolving air space process.  2. Status post right upper extremity PICC on February 04, 2008.  3. CT scan of the sinuses on February 02, 2008.  The results revealed      right sphenoid sinus inflammation.   HISTORY OF PRESENT ILLNESS:  The patient is a 30 year old woman with a  past medical history significant for HIV; however, she has not yet been  started on antiretroviral therapy, who presented to the emergency  department on February 02, 2008 with a chief complaint of dry cough,  shortness of breath, wheezing, headache, diarrhea, and low back pain.  The patient  also complained of sinus drainage and sinus headache.  When  she was evaluated in the emergency department, her temperature was noted  to be 102.9 degrees Fahrenheit.  Her white blood cell count was 8.2.  Her chest x-ray revealed extensive bilateral infiltrates.  The patient  was, therefore, admitted for further evaluation and management.   For additional details please see the dictated history and physical.   HOSPITAL COURSE:  1. ACUTE RESPIRATORY DISTRESS AND HYPOXIC RESPIRATORY FAILURE      SECONDARY TO BILATERAL PNEUMONIA. Blood cultures, an urine culture,      stool specimens to rule out C. diff, and an H1N1 nasal swab were      all obtained during the initial evaluation in the emergency      department.  Then, the patient was started on empiric antibiotic      treatment with Rocephin and azithromycin.  Antiviral therapy was      started with Tamiflu.  The patient was treated symptomatically with      albuterol, Atrovent  nebulizers, Mucinex, and Robitussin.  She      complained of sinus drainage; and, therefore, a CT scan of her      sinuses was ordered.  The CT scan revealed findings consistent with      acute sphenoid sinusitis.  Therefore, the patient was started on      Sudafed as needed for nasal congestion, Flonase, and Claritin.      During the first 24-36 hours of the hospitalization, the patient      remained febrile; however, she complained less of shortness of      breath.  On hospital day #3, her temperature reached a high of 103.      Subsequently, Rocephin was discontinued and antibiotic therapy was      broadened with Zosyn.  Night before last, the patient became      acutely short of breath.  Her oxygen saturation dropped to 88% on      room air; however, previously it had been ranging from 90%-95% on      room air.  Because of the acute shortness of breath, a followup      chest x-ray was ordered; and it revealed stable bilateral diffuse      infiltrates.  The  patient was started on oxygen supplementation.      An ABG was ordered on 100% oxygen and it revealed a pH of 7.48,      pCO2 of 29.6, and a pO2 of 252.  The patient was subsequently      transferred to the ICU.   Because of the persistent bilateral infiltrates and her history of HIV,  pentamidine was started empirically for PCP treatment.  Subsequently,  infectious diseases physician Dr. Maurice Neal and intensivist, Dr. Sherene Neal were  consulted.  Dr. Sherene Neal empirically started Solu-Medrol, and gave her 1  dose of 125 mg.  Dr. Maurice Neal evaluated the patient, yesterday, and agreed  with steroid treatment.  A number of studies had been ordered including  an LDH and sed rate.  The patient's LDH was elevated at 634 and her sed  rate was greater than 130.  Dr. Maurice Neal subsequently discontinued Zosyn and  azithromycin.  He recommended continuing pentamidine for PCP treatment.   So far, the patient's respiratory cultures have grown out only normal  oropharyngeal flora.  The H1N1 PCR is currently pending.  Her blood  cultures have been negative so far.  Another ABG was ordered while the  patient was on room air yesterday, and the results revealed a pH of  7.44, pCO2 of 28, and pO2 of 58.6.  Oxygen supplementation continues,  and the patient is actually oxygenating 100% on 2 liters of nasal  cannula oxygen.  She is currently improved, but not yet ready for  hospital discharge.  She will be moved to a telemetry bed today.   1. HIV INFECTION. The patient reported that she was diagnosed with      HIV, last year.  She is followed by [name] at the [facility].  Antiretroviral therapy has not been started yet.  A CD-4      count and viral load have been ordered; however, the results are      pending.   1. HYPERGLYCEMIA. The patient's capillary blood glucose has been      increasing on recent steroid therapy, therefore, sliding scale      NovoLog and Lantus will be added.  The patient's capillary  blood  glucose will be monitored q.a.c. and nightly.   1. HYPOKALEMIA.  The patient's serum potassium fell to a nadir of 3.3.      She was repleted with potassium chloride in the IV fluids and      orally.  Her magnesium level was assessed and found to be within      normal limits.  As of today, her serum potassium has improved to      5.0.   1. DIARRHEA/BACK PAIN/HEADACHE.  All of the patient's symptoms have      now resolved.  C. diff toxin x1 has been negative.  As indicated      above, the patient does have acute sphenoid sinusitis, and she is      being treated appropriately.  On exam, there has been no focal      neurological findings.   DISCHARGE DISPOSITION:  The patient is currently in improved condition;  however, she is not yet ready for hospital discharge.  She will continue  to be treated empirically for PCP pneumonia with pentamidine.  Tamiflu  treatment will continue as well.      [name], M.D.  Electronically Signed     DF/MEDQ  D:  02/06/2008  T:  02/06/2008  Job:  [id number]   cc:   Tresa Endo L. Philipp Neal, M.D.  Fax: [phone]

## 2010-12-29 NOTE — Discharge Summary (Signed)
[name], [name]              ACCOUNT NO.:  [id number]   MEDICAL RECORD NO.:  [id number]          PATIENT TYPE:  INP   LOCATION:  1512                         [facility]ACILITY:  [facility]   PHYSICIAN:  Isidor Holts, M.D.  DATE OF BIRTH:  03-Dec-1980   DATE OF ADMISSION:  02/02/2008  DATE OF DISCHARGE:  02/08/2008                               DISCHARGE SUMMARY   PRIMARY CARE PHYSICIAN:  Fleet Contras, M.D.   INFECTIOUS DISEASES PHYSICIAN:  Tresa Endo L. Philipp Deputy, M.D.   For details of discharge diagnoses, procedures, consultations, admission  history and clinical course, refer to the interim discharge summary  dictated February 06, 2008, by Dr. Elliot Cousin.  In addition, however,  for the period from February 06, 2008 through February 08, 2008 the following  are pertinent:   The patient's chest x-ray of June 23. 2009 showed improved aeration with  resolving air space process.  The patient felt considerably improved and  no longer short of breath.  She was saturating at 100% on room air.  She  had no pyrexia; and was very keen to be discharged.  She did have oral  thrush noted on physical examination of February 07, 2008.  This has been  addressed with a 10-day course of Diflucan.  She was reevaluated by Dr. Fransisco Hertz, infectious diseases  specialist, on February 07, 2008, and was transitioned from Pentamidine to  oral Bactrim DS for a further 18-day course, to complete a total of 3  weeks of antibiotic treatment. However, patient does state that she has  an allergy to Sulfa drugs. In view of this, after discussion with Dr Shela Commons.  hatcher,, it was elected to complete treatment with Atovaquone, instead.   DISPOSITION:  The patient does appear have Pneumocystis carinii  pneumonia, given the dramatic improvement on Pentamidine; although  respiratory secretion smears were negative for Pneumocystis carinii.  Results of H1N1 viral smear were still pending at the time of this  evaluation.  The patient's asthma has  remained stable through the course  of hospitalization.  Sinusitis appears controlled with Flonase.  The  patient, on February 08, 2008, was considered sufficiently clinically  recovered and stable to be discharged.  She was therefore, discharged  accordingly.   DISCHARGE MEDICATIONS:  1. Albuterol inhaler two puffs p.r.n. q.4-6 h.  2. Atovaquone suspension 750 mg p.o. b.i.d. for 18 days.  3. Diflucan 100 mg p.o. daily for 9 days.  4. Prednisone 30 mg p.o. b.i.d. for 7 days, then 20 mg p.o. daily for      14 days, then stop.  5. Flonase nasal spray, 2 sprays each nostril daily.  6. Mucinex 600 mg p.o. b.i.d. for 7 days only.   ACTIVITY:  As tolerated.   DIET:  No restrictions.   FOLLOW-UP INSTRUCTIONS:  The patient is to follow up with her primary  physician, Dr. Fleet Contras, per prior scheduled appointment.  In  addition, she is to follow up with Dr. Lina Sayre at the [facility], at a date to be determined.  She has been supplied with the  appropriate phone number [phone]) to arrange an appointment with  Ms. Francesca Oman.  All of this has been communicated to the patient.  She  has verbalized understanding.      Isidor Holts, M.D.  Electronically Signed     CO/MEDQ  D:  02/08/2008  T:  02/08/2008  Job:  [id number]   cc:   Fransisco Hertz, M.D.  Fax: [phone]   Fleet Contras, MD  Fax: [phone]

## 2010-12-29 NOTE — H&P (Signed)
[name], [name]              ACCOUNT NO.:  [id number]   MEDICAL RECORD NO.:  [id number]          PATIENT TYPE:  INP   LOCATION:  0103                         [facility]ACILITY:  [facility]   PHYSICIAN:  Hillery Aldo, M.D.   DATE OF BIRTH:  13-Nov-1980   DATE OF ADMISSION:  02/02/2008  DATE OF DISCHARGE:                              HISTORY & PHYSICAL   PRIMARY CARE PHYSICIAN:  Fleet Contras, M.D.   INFECTIOUS DISEASE PHYSICIAN:  Tresa Endo L. Vollmer, M.D.   CHIEF COMPLAINT:  Fever, chills, cough, diarrhea, dyspnea, body aches.   HISTORY OF PRESENT ILLNESS:  The patient is a 30 year old female with a  known history of HIV who presents with a 1-week history of cough.  The  cough has been mostly dry, but is occasionally productive of clear  sputum.  Approximately 2 days ago, she began having fevers.  Last night,  the patient was unable to sleep secondary to respiratory difficulties  and wheezing.  She also endorses headache and diarrhea.  She denies any  nausea or vomiting.  She has some sinus drainage and sinus headache.  Her appetite has been diminished.  She states that her daughter has been  running a fever as well.   PAST MEDICAL HISTORY:  1. Asthma.  2. Seasonal allergies.  3. HIV positive, diagnosed February 2008, not on any antiretroviral      therapy.  4. History of ovarian cyst.   PAST SURGICAL HISTORY:  Status post Cesarean section.   FAMILY HISTORY:  The patient's mother is alive at 8 and has  hypertension.  The patient's father is alive at 43 and is healthy.  The  patient has 4 siblings, 2 who suffer with hypertension.  She has 1  daughter who suffers with asthma.   SOCIAL HISTORY:  The patient is single.  She lives with her 31-year-old  daughter.  She works as a Retail banker at [employer].  She  is a lifelong nonsmoker.  She drinks alcohol occasionally.  She denies  any drug use.   ALLERGIES:  SULFA causes pruritus.   CURRENT MEDICATIONS:  1. Albuterol  p.r.n.  2. Ibuprofen.  3. Tylenol congestion.   REVIEW OF SYSTEMS:  As noted in the elements of the HPI above.  A  comprehensive 14-point review of systems is otherwise negative.   PHYSICAL EXAM:  Temperature 102.9, pulse 80, respirations 20, blood  pressure 124/87, O2 saturation 100% on room air.  GENERAL:  Well-developed, well-nourished black female in no acute  distress.  HEENT:  Normocephalic, atraumatic.  PERRL.  EOMI.  There is mild  posterior pharyngeal erythema.  NECK:  Supple, no thyromegaly, no lymphadenopathy, no jugular venous  distention.  CHEST:  Coarse breath sounds bilaterally, but no frank wheeze.  HEART:  Regular rate, rhythm.  No murmurs, rubs or gallops.  ABDOMEN:  Soft, nontender, nondistended with normoactive bowel sounds.  EXTREMITIES:  No clubbing, edema or cyanosis.  SKIN:  Dry.  No rashes.  NEUROLOGIC:  The patient is alert and oriented x3.  Cranial nerves II-  XII grossly intact.  Nonfocal.   DATA REVIEW:  Chest X-Ray: Shows extensive pneumonia.   LABORATORY DATA:  White blood cell count is 8.2, hemoglobin 10.3,  hematocrit 30.1, platelets 300.  Urine pregnancy testing is negative.  Urinalysis shows many bacteria and many epithelial cells.  There are 0  to 2 white blood cells.   ASSESSMENT/PLAN:  1. Pneumonia in an immunocompromised female with flu-like symptoms      suspicious for H1N1 influenza:  We will admit the patient and      empirically put her on Tamiflu as well as Rocephin and erythromycin      to cover community-acquired bacterial pneumonia.  In addition,      because the index of suspicion is high at for H1N1 in this patient,      we will put her on droplet isolation and obtain H1N1 cultures.  We      will also attempt to obtain sputum Gram stain and culture.  2. Human immunodeficiency virus:  The patient is unaware of what her      most recent CD-4 count was.  She apparently has not followed up      with Dr. Concepcion Elk in over a year and  has not kept many of her      appointments at the [facility] Clinic.  Given this, we will check a CD-4      count and an human immunodeficiency virus viral load.  3. Asthma:  The patient is not actively wheezing and she is      maintaining her O2 saturation at 100% on room air.  We will      administer bronchodilator therapy as needed.  4. Sinusitis:  We will obtain a CT scan of her sinuses and put her on      Flonase and Sudafed as needed.  5. Seasonal allergies:  Start this patient on Claritin.  6. Headache:  This is likely due to underlying influenza or sinusitis.      We will treat conservatively with Tylenol and address the      underlying conditions.  7. Prophylaxis:  We will administer subcutaneous heparin for deep vein      thrombosis prophylaxis.  The patient should not need      gastrointestinal prophylaxis unless she becomes critically ill.      Would avoid using Protonix at this time.      Hillery Aldo, M.D.  Electronically Signed     CR/MEDQ  D:  02/02/2008  T:  02/02/2008  Job:  [id number]   cc:   Fleet Contras, M.D.  Fax: [phone]   Pablo Lawrence. Philipp Deputy, M.D.  Fax: [phone]

## 2011-01-01 NOTE — Discharge Summary (Signed)
[facility]  Patient:    [name], [name] Visit Number: [id number] MRN: [id number]          Service Type: OBS Location: 910A 9130 01 Attending Physician:  Osborn Coho Dictated by:   Leilani Able, P.A. Admit Date:  09/11/2001 Discharge Date: 09/15/2001                             Discharge Summary  FINAL DIAGNOSES:              1. Intrauterine pregnancy at term.                               2. Arrest of active stage of labor.                               3. Delivery of a female infant with Apgars of 9                                  and 9.  PROCEDURE:                    Primary low flap transverse cesarean section.  SURGEON:                      Miguel Aschoff, M.D.  COMPLICATIONS:                None.  HOSPITAL COURSE:              This 30 year old G1, P0, presented at term, was admitted on the morning of January 28 in active labor.  The patient was about 4 cm dilated in the morning.  She had AROM performed and Pitocin augmentation begun.  That was also followed by an epidural block.  At about 2:30 that afternoon the patient had only dilated to 5 cm and had not progressed in over 2-1/2 hours.  In view of the exceedingly slow progress in active stage of labor with an adequate labor pattern, the patient was then taken to the operating room for cesarean section.  The patient was taken to the operating room by Dr. Miguel Aschoff, where a primary low flap transverse cesarean section was performed with the delivery of a 7 pound 1 ounce female infant with Apgars of 9 and 9.  The delivery went without complications.  The patients postoperative course was benign without significant fever.  She did have some postoperative anemia, was started on iron in the hospital.  She was sent home on postoperative day #3, sent home on a regular diet, told to decrease activities, told to continue prenatal vitamins and iron sulfate, was given a prescription for  Tylox one to two every four hours as needed for pain and was to follow up in the office in four to six weeks. Dictated by:   Leilani Able, P.A. Attending Physician:  Osborn Coho DD:  10/31/01 TD:  11/01/01 Job: [id number]

## 2011-01-01 NOTE — Op Note (Signed)
[facility]  Patient:    MADLYNN, LUNDEEN Visit Number: [id number] MRN: [id number]          Service Type: OBS Location: 910A 9199 02 Attending Physician:  Osborn Coho Dictated by:   Miguel Aschoff, M.D. Proc. Date: 09/12/01 Admit Date:  09/11/2001                             Operative Report  PREOPERATIVE DIAGNOSES:       1. Intrauterine pregnancy at term.                               2. Arrest of active stage of labor.  POSTOPERATIVE DIAGNOSES:      1. Intrauterine pregnancy at term.                               2. Arrest of active stage of labor.                               3. Delivery of viable female infant with Apgars                                  of 9 and 9.  PROCEDURE:                    Primary low flap transverse cesarean section.  SURGEON:                      Miguel Aschoff, M.D.  ANESTHESIA:                   Epidural.  COMPLICATIONS:                None.  JUSTIFICATION:                The patient is a 30 year old black female, gravida 1, para 0 at term who was admitted early on the morning of September 12, 2000 at 3-4 cm dilatation.  At 9:00 a.m., the patient was 4 cm.  She had artificial rupture of membranes and Pitocin augmentation instituted followed by an epidural block.  At approximately 2:30, she was only 5 cm and had not progressed in over 2-1/2 hours.  In view of the exceedingly slow progress in the active stage of labor with adequate labor, the patient is now being taken for primary cesarean section.  DESCRIPTION OF PROCEDURE:     The patient was taken to the operating room and placed in the supine position deviated to the left.  She was then prepped and draped in the usual sterile fashion.  A satisfactory level of epidural anesthesia was achieved without difficulty.  After this was done, a Pfannenstiel incision was made and extended down through the subcutaneous tissue, with bleeding points being clamped and  coagulated as they were encountered.  The fascia was then identified, incised transversely and separated from the underlying rectus muscles.  The rectus muscles were divided in the midline.  The peritoneum was then identified and entered, carefully avoiding underlying structures.  Then, a bladder flap was created and protected with a bladder blade.  An elliptical transverse incision was made into  the lower uterine segment.  The amniotic cavity was entered.  Clear fluid was obtained.  The patient was then delivered of a viable female infant with Apgars of 9 at one minute and 9 at five minutes from a vertex ROT position. The nose and mouth were suctioned.  The baby was handed to the pediatric team in attendance.  The baby weighed 7 lb 1 oz.  Cord bloods were then obtained for appropriate studies and the placenta was delivered.  The uterus was evacuated of any remaining products of conception.  The angles of the uterine incision were then ligated using figure-of-eight sutures of #1 Vicryl.  Then, the uterus was closed in layers.  The first layer was running interlocking suture of #1 Vicryl, followed by an imbricating suture of #1 Vicryl.  The bladder flap was then reapproximated using running continuous 2-0 Vicryl suture.  The abdomen was then irrigated with warm saline.  Lap counts were taken and found to be correct.  Then, the abdomen was closed.  The parietal peritoneum was closed using running continuous 0 Vicryl suture.  The rectus muscles were reapproximated using running continuous 0 Vicryl.  The fascia was closed using two sutures of 0 Vicryl, each starting at the lateral fascial angles and meeting in the midline.  The subcutaneous tissue and skin was closed using staples.  Estimated blood loss was approximately 600 cc.  The patient tolerated the procedure well and went to the recovery room in satisfactory condition. Dictated by:   Miguel Aschoff, M.D. Attending Physician:  Osborn Coho DD:  09/12/01 TD:  09/12/01 Job: [id number]

## 2011-01-15 ENCOUNTER — Other Ambulatory Visit: Payer: Self-pay | Admitting: Adult Health

## 2011-02-09 ENCOUNTER — Other Ambulatory Visit: Payer: Self-pay | Admitting: *Deleted

## 2011-02-10 NOTE — Telephone Encounter (Signed)
Encounter opened in error

## 2011-05-04 ENCOUNTER — Other Ambulatory Visit: Payer: Self-pay | Admitting: *Deleted

## 2011-05-04 MED ORDER — BECLOMETHASONE DIPROPIONATE 40 MCG/ACT IN AERS: 1.0000 | INHALATION_SPRAY | Freq: Two times a day (BID) | RESPIRATORY_TRACT | Status: DC

## 2011-05-07 LAB — POCT PREGNANCY, URINE
Operator id: 235561
Preg Test, Ur: NEGATIVE

## 2011-05-07 LAB — POCT URINALYSIS DIP (DEVICE)
Bilirubin Urine: NEGATIVE
Ketones, ur: NEGATIVE
Nitrite: NEGATIVE
Operator id: 235561

## 2011-05-13 LAB — BLOOD GAS, ARTERIAL
Acid-base deficit: 3.2 — ABNORMAL HIGH
Bicarbonate: 19.4 — ABNORMAL LOW
Drawn by: 295031
FIO2: 1
O2 Saturation: 90.2
O2 Saturation: 99.5
Patient temperature: 98.6
Patient temperature: 98.6
TCO2: 17.7
pCO2 arterial: 28.4 — ABNORMAL LOW
pH, Arterial: 7.449 — ABNORMAL HIGH
pO2, Arterial: 252 — ABNORMAL HIGH
pO2, Arterial: 58.6 — ABNORMAL LOW

## 2011-05-13 LAB — BASIC METABOLIC PANEL
BUN: 11
BUN: 3 — ABNORMAL LOW
BUN: 6
CO2: 22
CO2: 24
Calcium: 8.3 — ABNORMAL LOW
Calcium: 8.4
Calcium: 8.5
Calcium: 8.9
Calcium: 9.1
Chloride: 107
Creatinine, Ser: 0.6
Creatinine, Ser: 0.64
Creatinine, Ser: 0.74
Creatinine, Ser: 0.74
GFR calc Af Amer: >60
GFR calc Af Amer: >60
GFR calc non Af Amer: >60
GFR calc non Af Amer: >60
GFR calc non Af Amer: >60
GFR calc non Af Amer: >60
Glucose, Bld: 103 — ABNORMAL HIGH
Glucose, Bld: 110 — ABNORMAL HIGH
Glucose, Bld: 139 — ABNORMAL HIGH
Glucose, Bld: 168 — ABNORMAL HIGH
Sodium: 135
Sodium: 138

## 2011-05-13 LAB — CBC
HCT: 29.2 — ABNORMAL LOW
HCT: 30.4 — ABNORMAL LOW
HCT: 32.1 — ABNORMAL LOW
HCT: 32.4 — ABNORMAL LOW
Hemoglobin: 10 — ABNORMAL LOW
Hemoglobin: 10 — ABNORMAL LOW
Hemoglobin: 10.1 — ABNORMAL LOW
Hemoglobin: 10.8 — ABNORMAL LOW
MCHC: 33.3
MCHC: 33.5
MCHC: 33.7
MCHC: 34.4
MCV: 90.2
MCV: 90.4
MCV: 91.1
MCV: 91.2
MCV: 91.2
MCV: 92.2
Platelets: 291
Platelets: 300
Platelets: 332
Platelets: 392
Platelets: 432 — ABNORMAL HIGH
Platelets: 473 — ABNORMAL HIGH
RBC: 3.33 — ABNORMAL LOW
RBC: 3.33 — ABNORMAL LOW
RBC: 3.52 — ABNORMAL LOW
RBC: 3.53 — ABNORMAL LOW
RDW: 13.9
RDW: 14
RDW: 14
RDW: 14.2
RDW: 14.4
RDW: 14.5
WBC: 14 — ABNORMAL HIGH
WBC: 14 — ABNORMAL HIGH
WBC: 14.4 — ABNORMAL HIGH
WBC: 8.2

## 2011-05-13 LAB — COMPREHENSIVE METABOLIC PANEL
ALT: 35
ALT: 40 — ABNORMAL HIGH
Alkaline Phosphatase: 76
Alkaline Phosphatase: 83
BUN: 6
BUN: 6
CO2: 22
CO2: 23
GFR calc non Af Amer: >60
GFR calc non Af Amer: >60
Glucose, Bld: 110 — ABNORMAL HIGH
Glucose, Bld: 162 — ABNORMAL HIGH
Potassium: 2.9 — ABNORMAL LOW
Potassium: 5
Sodium: 138
Total Bilirubin: 0.6
Total Protein: 7.1

## 2011-05-13 LAB — DIFFERENTIAL
Basophils Absolute: 0
Basophils Relative: 0
Eosinophils Absolute: 0
Eosinophils Absolute: 0
Eosinophils Relative: 0
Lymphocytes Relative: 5 — ABNORMAL LOW
Lymphs Abs: 0.7
Lymphs Abs: 0.7
Monocytes Absolute: 0.8
Monocytes Relative: 11
Monocytes Relative: 6
Neutro Abs: 12.5 — ABNORMAL HIGH
Neutro Abs: 6.6
Neutrophils Relative %: 81 — ABNORMAL HIGH
Neutrophils Relative %: 89 — ABNORMAL HIGH

## 2011-05-13 LAB — P CARINII SMEAR DFA: Pneumocystis carinii DFA: NEGATIVE

## 2011-05-13 LAB — URINALYSIS, ROUTINE W REFLEX MICROSCOPIC
Glucose, UA: NEGATIVE
Nitrite: NEGATIVE
Protein, ur: 100 — AB
Urobilinogen, UA: 1

## 2011-05-13 LAB — STOOL CULTURE

## 2011-05-13 LAB — POCT PREGNANCY, URINE
Operator id: 261601
Preg Test, Ur: NEGATIVE

## 2011-05-13 LAB — HIV-1 GENOTYPR PLUS

## 2011-05-13 LAB — CULTURE, BLOOD (ROUTINE X 2): Culture: NO GROWTH

## 2011-05-13 LAB — MAGNESIUM: Magnesium: 2.2

## 2011-05-13 LAB — LACTATE DEHYDROGENASE: LDH: 634 — ABNORMAL HIGH

## 2011-05-13 LAB — POCT I-STAT, CHEM 8
BUN: 29 — ABNORMAL HIGH
Chloride: 101
Creatinine, Ser: 0.8
Creatinine, Ser: 1.1
Glucose, Bld: 147 — ABNORMAL HIGH
HCT: 31 — ABNORMAL LOW
Hemoglobin: 10.5 — ABNORMAL LOW
Potassium: 3.1 — ABNORMAL LOW
Potassium: 4.5
Sodium: 135
Sodium: 136

## 2011-05-13 LAB — URINE CULTURE

## 2011-05-13 LAB — URINE MICROSCOPIC-ADD ON

## 2011-05-13 LAB — EXPECTORATED SPUTUM ASSESSMENT W GRAM STAIN, RFLX TO RESP C

## 2011-05-13 LAB — B-NATRIURETIC PEPTIDE (CONVERTED LAB): Pro B Natriuretic peptide (BNP): 55.5

## 2011-05-13 LAB — CULTURE, RESPIRATORY W GRAM STAIN: Culture: NORMAL

## 2011-05-14 LAB — T-HELPER CELL (CD4) - (RCID CLINIC ONLY): CD4 % Helper T Cell: 1 — ABNORMAL LOW

## 2011-05-19 LAB — T-HELPER CELL (CD4) - (RCID CLINIC ONLY)
CD4 % Helper T Cell: 3 — ABNORMAL LOW
CD4 T Cell Abs: 20 — ABNORMAL LOW

## 2011-05-24 LAB — POCT PREGNANCY, URINE
Operator id: 239701
Preg Test, Ur: NEGATIVE

## 2011-05-25 LAB — POCT URINALYSIS DIP (DEVICE)
Glucose, UA: NEGATIVE
Hgb urine dipstick: NEGATIVE
Nitrite: NEGATIVE
Protein, ur: NEGATIVE
Specific Gravity, Urine: 1.015
Urobilinogen, UA: 1
pH: 6.5

## 2011-05-25 LAB — GC/CHLAMYDIA PROBE AMP, GENITAL
Chlamydia, DNA Probe: NEGATIVE
GC Probe Amp, Genital: NEGATIVE

## 2011-05-25 LAB — WET PREP, GENITAL: Trich, Wet Prep: NONE SEEN

## 2011-05-25 LAB — POCT PREGNANCY, URINE: Preg Test, Ur: NEGATIVE

## 2011-05-31 LAB — I-STAT 8, (EC8 V) (CONVERTED LAB)
BUN: 10
Bicarbonate: 24.3 — ABNORMAL HIGH
Chloride: 107
Glucose, Bld: 99
Hemoglobin: 13.6
Operator id: 196461
Potassium: 3.3 — ABNORMAL LOW
Sodium: 141

## 2011-05-31 LAB — DIFFERENTIAL
Basophils Absolute: 0
Eosinophils Absolute: 0.3
Lymphocytes Relative: 25
Lymphs Abs: 2.2
Monocytes Absolute: 0.4
Monocytes Relative: 5
Neutro Abs: 5.8

## 2011-05-31 LAB — CBC
Hemoglobin: 12.3
RDW: 13.8
WBC: 8.8

## 2011-05-31 LAB — D-DIMER, QUANTITATIVE: D-Dimer, Quant: 0.39

## 2011-06-02 LAB — I-STAT 8, (EC8 V) (CONVERTED LAB)
Acid-base deficit: 2
Chloride: 108
HCT: 38
Operator id: 277751
Potassium: 3.8
Sodium: 138
TCO2: 24
pH, Ven: 7.404 — ABNORMAL HIGH

## 2011-08-16 ENCOUNTER — Other Ambulatory Visit: Payer: Self-pay | Admitting: *Deleted

## 2011-08-16 NOTE — Telephone Encounter (Signed)
rec'd call from [place] at front desk. Pt had called for refills & was upset we were not going to fill them. Told her we had not rec'd a request for any meds. And we would not refuse to fill HIV meds.   Epic shows Qvar as her only med. EMR shows last visit was in 06/2010 for a sinus infection & she has several more meds. [facility]  I called them.  She has been getting her HIV meds from Dr. Concepcion Elk & Dr. Ernst Breach. They have refused to fill the meds until she comes in for a visit there. The pharmacist is not sure why she called [place] when we have not been seeing her or dispensing meds. I called her at [phone] and told her that per T. Brooke Dare RN, we will not fill them until she is seen. I suggested that since they have filled them in the past year, she may be able to get in to see them before we can on the 17th. She hung up on me She called back & was upset. I told her what I said in the first conversation. I told her she must be seen regularly

## 2011-08-18 ENCOUNTER — Other Ambulatory Visit: Payer: Medicaid Other

## 2011-08-18 ENCOUNTER — Other Ambulatory Visit: Payer: Self-pay | Admitting: Infectious Diseases

## 2011-08-18 DIAGNOSIS — B20 Human immunodeficiency virus [HIV] disease: Secondary | ICD-10-CM

## 2011-08-18 DIAGNOSIS — Z79899 Other long term (current) drug therapy: Secondary | ICD-10-CM

## 2011-08-18 DIAGNOSIS — Z113 Encounter for screening for infections with a predominantly sexual mode of transmission: Secondary | ICD-10-CM

## 2011-09-02 ENCOUNTER — Telehealth: Payer: Self-pay | Admitting: *Deleted

## 2011-09-02 ENCOUNTER — Ambulatory Visit: Payer: Medicaid Other | Admitting: Infectious Diseases

## 2011-09-02 NOTE — Telephone Encounter (Signed)
Left message for patient to call the clinic and reschedule her lab and office visit appointments. Wendall Mola CMA

## 2011-09-14 ENCOUNTER — Emergency Department (HOSPITAL_COMMUNITY)
Admission: EM | Admit: 2011-09-14 | Discharge: 2011-09-14 | Disposition: A | Discharge status: Home / Self Care | Payer: Medicaid Other | Source: Home / Self Care | Attending: Family Medicine | Admitting: Family Medicine

## 2011-09-14 ENCOUNTER — Encounter (HOSPITAL_COMMUNITY): Payer: Self-pay | Admitting: *Deleted

## 2011-09-14 DIAGNOSIS — N898 Other specified noninflammatory disorders of vagina: Secondary | ICD-10-CM

## 2011-09-14 LAB — POCT URINALYSIS DIP (DEVICE)
Bilirubin Urine: NEGATIVE
Ketones, ur: NEGATIVE mg/dL
Nitrite: NEGATIVE
Protein, ur: NEGATIVE mg/dL
pH: 5.5 (ref 5.0–8.0)

## 2011-09-14 MED ORDER — METRONIDAZOLE 500 MG PO TABS: 500.0000 mg | ORAL_TABLET | Freq: Two times a day (BID) | ORAL | Status: AC

## 2011-09-14 NOTE — ED Notes (Signed)
VAGINAL  DISCHARGE     - PT  REPORTS  SHE  WAS  TREATED       LAST  WEEK        WITH  DIFLUCAN  FOR  POSS    YEAST  INFECTION  BY  PCP  SHE  REPORTS  AT THIS  TIME       SHE  HAS  A  VAGINAL  IRRITATION        SHE  APPEARS  IN NO  ACUTE  DISTRESS         SITTING  COMFORTBALY  ON  EXAM  TABLE

## 2011-09-14 NOTE — ED Provider Notes (Signed)
[name] is a 31 y.o. female who presents to Urgent Care today for vaginal irritation. 2 weeks ago she was treated with antibiotics for a sinus infection. She develops vaginal irritation and was seen at her primary care office and prescribed fluconazole.  This did not help. She says her symptoms are consistent with past episodes of bacterial vaginosis.  She feels well otherwise.  She declines vaginal exam tonight, when informed of the test will take at least 30 minutes to come back.   PMH reviewed.  ROS as above otherwise neg Medications reviewed. No current facility-administered medications for this encounter.   Current Outpatient Prescriptions  Medication Sig Dispense Refill  . beclomethasone (QVAR) 40 MCG/ACT inhaler Inhale 1 puff into the lungs 2 (two) times daily.  1 Inhaler  3  . lamiVUDine-zidovudine (COMBIVIR) 150-300 MG per tablet Take 1 tablet by mouth 2 (two) times daily.        . metroNIDAZOLE (FLAGYL) 500 MG tablet Take 1 tablet (500 mg total) by mouth 2 (two) times daily.  14 tablet  0  . raltegravir (ISENTRESS) 400 MG tablet Take 400 mg by mouth 2 (two) times daily.        Marland Kitchen tenofovir (VIREAD) 300 MG tablet Take 300 mg by mouth daily.          Exam:  BP 131/83  Pulse 68  Temp(Src) 98.1 F (36.7 C) (Oral)  Resp 16  SpO2 100% Gen: Well NAD HEENT: EOMI,  MMM Lungs: CTABL Nl WOB Heart: RRR no MRG Abd: NABS, NT, ND Exts:, warm and well perfused.   Assessment and Plan: 31 year old woman with likely bacterial vaginosis. Will treat empirically with Flagyl twice daily for 7 days. Patient has no point with her primary care doctor in a few days and will followup.      Clementeen Graham, MD 09/14/11 2015

## 2011-09-15 NOTE — ED Provider Notes (Signed)
Medical screening examination/treatment/procedure(s) were performed by PGY-3 FM resident and as supervising physician I was immediately available for consultation/collaboration.   Sharin Grave, MD   Sharin Grave, MD 09/15/11 [phone]

## 2011-10-20 ENCOUNTER — Other Ambulatory Visit: Payer: Medicaid Other

## 2011-10-21 ENCOUNTER — Telehealth: Payer: Self-pay | Admitting: *Deleted

## 2011-10-21 NOTE — Telephone Encounter (Signed)
Called patient and rescheduled her lab appointment. Wendall Mola CMA

## 2011-10-25 ENCOUNTER — Other Ambulatory Visit: Payer: Medicaid Other

## 2011-10-27 ENCOUNTER — Telehealth: Payer: Self-pay | Admitting: *Deleted

## 2011-10-27 NOTE — Telephone Encounter (Signed)
Called patient and left voice mail to reschedule her lab appointment.  This is her second no show, she has f/u with Dr. Ninetta Lights 11/03/11.  If she does not reschedule prior to appointment will need a [facility] referral. Wendall Mola CMA

## 2011-11-03 ENCOUNTER — Telehealth: Payer: Self-pay | Admitting: *Deleted

## 2011-11-03 ENCOUNTER — Ambulatory Visit: Payer: Medicaid Other | Admitting: Infectious Diseases

## 2011-11-03 NOTE — Telephone Encounter (Signed)
I left a message asking her to call back & reschedule todAY's appt

## 2011-11-22 ENCOUNTER — Other Ambulatory Visit: Payer: Self-pay | Admitting: Licensed Clinical Social Worker

## 2011-11-22 ENCOUNTER — Other Ambulatory Visit (INDEPENDENT_AMBULATORY_CARE_PROVIDER_SITE_OTHER): Payer: Medicaid Other

## 2011-11-22 ENCOUNTER — Other Ambulatory Visit (HOSPITAL_COMMUNITY)
Admission: RE | Admit: 2011-11-22 | Discharge: 2011-11-22 | Disposition: A | Discharge status: Home / Self Care | Payer: Medicaid Other | Source: Ambulatory Visit | Attending: Infectious Diseases | Admitting: Infectious Diseases

## 2011-11-22 ENCOUNTER — Telehealth: Payer: Self-pay | Admitting: *Deleted

## 2011-11-22 DIAGNOSIS — O98519 Other viral diseases complicating pregnancy, unspecified trimester: Secondary | ICD-10-CM

## 2011-11-22 DIAGNOSIS — B20 Human immunodeficiency virus [HIV] disease: Secondary | ICD-10-CM

## 2011-11-22 DIAGNOSIS — Z113 Encounter for screening for infections with a predominantly sexual mode of transmission: Secondary | ICD-10-CM | POA: Insufficient documentation

## 2011-11-22 DIAGNOSIS — Z79899 Other long term (current) drug therapy: Secondary | ICD-10-CM

## 2011-11-22 NOTE — Telephone Encounter (Signed)
Patient called back and I told her we are not able fax it and she hung up on me as I was trying to get her to come for an appointment. Will try to call her later.

## 2011-11-22 NOTE — Telephone Encounter (Signed)
Patient called and advised that she will be having labs done at [facility] labs at [address], and wondered if we could fax the labs that we need to them. After speaking with our lab we are not able to due to the labs we need and the handling we need for our labs. Patient is supposed to call back to give me a fax number for the lab and I will tell her then.

## 2011-11-23 LAB — CBC WITH DIFFERENTIAL/PLATELET
Eosinophils Absolute: 0.2 10*3/uL (ref 0.0–0.7)
Eosinophils Relative: 2 % (ref 0–5)
Hemoglobin: 14.5 g/dL (ref 12.0–15.0)
Lymphs Abs: 5.1 10*3/uL — ABNORMAL HIGH (ref 0.7–4.0)
MCH: 34.9 pg — ABNORMAL HIGH (ref 26.0–34.0)
MCHC: 33.6 g/dL (ref 30.0–36.0)
MCV: 103.6 fL — ABNORMAL HIGH (ref 78.0–100.0)
Monocytes Relative: 4 % (ref 3–12)
RBC: 4.16 MIL/uL (ref 3.87–5.11)

## 2011-11-23 LAB — URINALYSIS, MICROSCOPIC ONLY
Casts: NONE SEEN
Crystals: NONE SEEN

## 2011-11-23 LAB — COMPREHENSIVE METABOLIC PANEL
BUN: 21 mg/dL (ref 6–23)
CO2: 22 mEq/L (ref 19–32)
Creat: 0.91 mg/dL (ref 0.50–1.10)
Glucose, Bld: 110 mg/dL — ABNORMAL HIGH (ref 70–99)
Total Bilirubin: 0.6 mg/dL (ref 0.3–1.2)

## 2011-11-23 LAB — T-HELPER CELL (CD4) - (RCID CLINIC ONLY): CD4 T Cell Abs: 1770 uL (ref 400–2700)

## 2011-11-23 LAB — LIPID PANEL
Cholesterol: 211 mg/dL — ABNORMAL HIGH (ref 0–200)
LDL Cholesterol: 147 mg/dL — ABNORMAL HIGH (ref 0–99)
Triglycerides: 155 mg/dL — ABNORMAL HIGH (ref <150)

## 2011-11-23 LAB — URINALYSIS, ROUTINE W REFLEX MICROSCOPIC
Glucose, UA: NEGATIVE mg/dL
Leukocytes, UA: NEGATIVE
Protein, ur: NEGATIVE mg/dL
Specific Gravity, Urine: 1.021 (ref 1.005–1.030)
Urobilinogen, UA: 0.2 mg/dL (ref 0.0–1.0)

## 2011-11-23 LAB — RPR

## 2011-11-24 LAB — HIV-1 RNA QUANT-NO REFLEX-BLD
HIV 1 RNA Quant: 5187 copies/mL — ABNORMAL HIGH (ref <20)
HIV-1 RNA Quant, Log: 3.71 {Log} — ABNORMAL HIGH (ref <1.30)

## 2011-12-14 ENCOUNTER — Other Ambulatory Visit: Payer: Self-pay | Admitting: Internal Medicine

## 2011-12-14 ENCOUNTER — Ambulatory Visit (INDEPENDENT_AMBULATORY_CARE_PROVIDER_SITE_OTHER): Payer: Medicaid Other | Admitting: Internal Medicine

## 2011-12-14 ENCOUNTER — Encounter: Payer: Self-pay | Admitting: Internal Medicine

## 2011-12-14 DIAGNOSIS — B20 Human immunodeficiency virus [HIV] disease: Secondary | ICD-10-CM

## 2011-12-14 DIAGNOSIS — J45909 Unspecified asthma, uncomplicated: Secondary | ICD-10-CM

## 2011-12-14 DIAGNOSIS — O98519 Other viral diseases complicating pregnancy, unspecified trimester: Secondary | ICD-10-CM

## 2011-12-14 MED ORDER — BECLOMETHASONE DIPROPIONATE 40 MCG/ACT IN AERS: 1.0000 | INHALATION_SPRAY | Freq: Two times a day (BID) | RESPIRATORY_TRACT | Status: DC

## 2011-12-14 NOTE — Assessment & Plan Note (Addendum)
I am concerned that she is either resistant or her viral load is a reflection of restarting her medications and is on the downward trend. I will recheck again today and included genotype. She will then return in 2 weeks time. At that time, I will consider streamlining her regimen to a lower pill burden. 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

## 2011-12-14 NOTE — Progress Notes (Signed)
  Subjective:    Patient ID: [name], female    DOB: [birth year], 31 y.o.   MRN: [id number]  HPI She comes in now after a long absence from the clinic. She has been on Combivir, tenofovir and Isentress for several years and has been taking it well despite not following up. That was before this year when she did miss about 2 months and had significant problems withdepression and actually required significant intervention with the act team and will hospitalization was considered. However now she is on psychiatric medications and hasn't doing better. She also has restarted her medications recently. She does have a detectable viral load of 5000 which is 2 logs below her baseline. She otherwise has no complaints regarding nausea, diarrhea or other complaints.   Review of Systems  Constitutional: Negative for fever, chills, fatigue and unexpected weight change.  HENT: Negative for sore throat and trouble swallowing.   Respiratory: Negative for cough, shortness of breath and wheezing.   Cardiovascular: Negative for chest pain, palpitations and leg swelling.  Gastrointestinal: Negative for nausea, abdominal pain and diarrhea.  Musculoskeletal: Negative for myalgias, joint swelling and arthralgias.  Skin: Negative for rash.  Neurological: Negative for dizziness, weakness and headaches.  Hematological: Negative for adenopathy.  Psychiatric/Behavioral: Positive for dysphoric mood. Negative for suicidal ideas. The patient is not nervous/anxious.        Objective:   Physical Exam  Constitutional: She appears well-developed and well-nourished. No distress.  HENT:  Mouth/Throat: Oropharynx is clear and moist. No oropharyngeal exudate.  Cardiovascular: Normal rate, regular rhythm and normal heart sounds.  Exam reveals no gallop and no friction rub.   No murmur heard. Pulmonary/Chest: Effort normal and breath sounds normal. No respiratory distress. She has no wheezes. She has no rales.  Abdominal:  Soft. Bowel sounds are normal. She exhibits no distension. There is no tenderness. There is no rebound.  Lymphadenopathy:    She has no cervical adenopathy.          Assessment & Plan:

## 2011-12-16 ENCOUNTER — Ambulatory Visit: Payer: Medicaid Other | Admitting: Internal Medicine

## 2011-12-16 LAB — T-HELPER CELL (CD4) - (RCID CLINIC ONLY)
CD4 % Helper T Cell: 17 % — ABNORMAL LOW (ref 33–55)
CD4 T Cell Abs: 720 uL (ref 400–2700)

## 2011-12-17 ENCOUNTER — Other Ambulatory Visit: Payer: Self-pay | Admitting: Internal Medicine

## 2011-12-17 ENCOUNTER — Telehealth: Payer: Self-pay | Admitting: *Deleted

## 2011-12-17 DIAGNOSIS — B20 Human immunodeficiency virus [HIV] disease: Secondary | ICD-10-CM

## 2011-12-17 LAB — HIV-1 RNA ULTRAQUANT REFLEX TO GENTYP+: HIV 1 RNA Quant: 91390 copies/mL — ABNORMAL HIGH (ref <20)

## 2011-12-17 NOTE — Telephone Encounter (Signed)
Message copied by Macy Mis on Fri Dec 17, 2011  2:45 PM ------      Message from: COMER, ROBERT W      Created: Fri Dec 17, 2011 10:25 AM       She needs to stop her medicine (combivir, tenofovir and Isentress) because it is not working, likely resistant. It went up instead of down.  She was supposed to be seen around the 14th with me to follow up the genotype but was scheduled with Orvan Falconer on the 23rd?  If she is a Herbalist patient, that is fine, but I think she is just an old [facility] pt who hadn't followed up.  I have plenty of openings around the 14th (13th, 16th, 17th).  Not sure why with Orvan Falconer much later?              Also, I would like to add on an integrase resistance assay to her labs or have her come in for it if needed.

## 2011-12-17 NOTE — Progress Notes (Signed)
Addended by: Mariea Clonts D on: 12/17/2011 03:43 PM   Modules accepted: Orders

## 2011-12-17 NOTE — Telephone Encounter (Signed)
Called patient and had to leave voice mail, left message to stop her HIV meds and that she needs a follow up appointment.  [name] in the lab will add the test if possible, otherwise she will have to come back in to have the lab drawn. Wendall Mola CMA

## 2011-12-21 ENCOUNTER — Telehealth: Payer: Self-pay | Admitting: *Deleted

## 2011-12-21 NOTE — Telephone Encounter (Signed)
[facility] was able to find patient and she was notified to stop her HIV meds and was scheduled an appointment with Dr. Luciana Axe for 12/30/11 at 4:00 pm. Wendall Mola CMA

## 2011-12-22 LAB — HIV-1 GENOTYPR PLUS

## 2011-12-23 LAB — HIV-1 INTEGRASE GENOTYPE: Value last viral load: 91390 copies/mL

## 2011-12-30 ENCOUNTER — Encounter: Payer: Self-pay | Admitting: Internal Medicine

## 2011-12-30 ENCOUNTER — Ambulatory Visit (INDEPENDENT_AMBULATORY_CARE_PROVIDER_SITE_OTHER): Payer: Medicaid Other | Admitting: Internal Medicine

## 2011-12-30 VITALS — BP 128/89 | HR 103 | Temp 98.1°F | Ht 64.0 in | Wt 171.0 lb

## 2011-12-30 DIAGNOSIS — B20 Human immunodeficiency virus [HIV] disease: Secondary | ICD-10-CM

## 2011-12-30 MED ORDER — RALTEGRAVIR POTASSIUM 400 MG PO TABS: 400.0000 mg | ORAL_TABLET | Freq: Two times a day (BID) | ORAL | Status: DC

## 2011-12-30 MED ORDER — DARUNAVIR ETHANOLATE 800 MG PO TABS: 800.0000 mg | ORAL_TABLET | Freq: Every day | ORAL | Status: DC

## 2011-12-30 MED ORDER — RITONAVIR 100 MG PO TABS: 100.0000 mg | ORAL_TABLET | Freq: Every day | ORAL | Status: DC

## 2011-12-30 MED ORDER — EMTRICITABINE-TENOFOVIR DF 200-300 MG PO TABS: 1.0000 | ORAL_TABLET | Freq: Every day | ORAL | Status: DC

## 2011-12-30 NOTE — Assessment & Plan Note (Signed)
I evaluated the mutations on the [employer] and discuss the options with the patient. Just side effects the possibility of resistance without good compliance. I discussed the need to be compliant and have good followup in order to keep her immune system safe. I did remind her that she needs to use condoms with all sexual activity. I am going to start her on Prezista, Norvir, Truvada and Isentress. I will have her get labs in 3-4 weeks and I will see her after that.

## 2011-12-30 NOTE — Progress Notes (Signed)
  Subjective:    Patient ID: [name], female    DOB: [birth year], 31 y.o.   MRN: [id number]  HPI Comes in now for followup of her HIV. She had presented here about 2 weeks ago after a long absence and previously was on Combivir, tenofovir and Isentress and reported taking it despite no followup. 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 When she presented in April, her viral load was 5000 but followup labs showed a viral load that was at her baseline over 100,000. I therefore had her stop her medication and sent off resistance genotype test for integrase inhibitor and a general genotype. Her Isentress is still viable and there were no new mutations noted on the genotype.   Review of Systems  Constitutional: Negative.   HENT: Negative.   Respiratory: Negative.   Cardiovascular: Negative.   Gastrointestinal: Negative.   Musculoskeletal: Negative.   Skin: Negative.   Hematological: Negative.   Psychiatric/Behavioral: Negative.        Objective:   Physical Exam  Constitutional: She appears well-developed and well-nourished. No distress.  HENT:  Mouth/Throat: Oropharynx is clear and moist. No oropharyngeal exudate.  Cardiovascular: Normal rate, regular rhythm and normal heart sounds.  Exam reveals no gallop and no friction rub.   No murmur heard. Pulmonary/Chest: Effort normal and breath sounds normal. No respiratory distress. She has no wheezes. She has no rales.  Abdominal: Soft. Bowel sounds are normal. She exhibits no distension. There is no tenderness. There is no rebound.  Skin: Skin is warm and dry. No rash noted. No erythema.          Assessment & Plan:

## 2012-01-04 ENCOUNTER — Telehealth: Payer: Self-pay | Admitting: *Deleted

## 2012-01-04 NOTE — Telephone Encounter (Signed)
She wanted to speak with Sharol Roussel. She needs a voucher so she can get cloths for her child. Selena Batten is not here now. I told her I will send her a message

## 2012-01-05 NOTE — Telephone Encounter (Signed)
I told [name] about this call in person this am

## 2012-01-06 ENCOUNTER — Ambulatory Visit: Payer: Medicaid Other | Admitting: Internal Medicine

## 2012-01-26 ENCOUNTER — Telehealth: Payer: Self-pay | Admitting: *Deleted

## 2012-01-26 NOTE — Telephone Encounter (Signed)
Reminder message and asked if pt wanted to schedule PAP smear at the same time.

## 2012-01-28 ENCOUNTER — Other Ambulatory Visit (HOSPITAL_COMMUNITY)
Admission: RE | Admit: 2012-01-28 | Discharge: 2012-01-28 | Disposition: A | Discharge status: Home / Self Care | Payer: Medicaid Other | Source: Ambulatory Visit | Attending: Infectious Diseases | Admitting: Infectious Diseases

## 2012-01-28 ENCOUNTER — Other Ambulatory Visit: Payer: Medicaid Other

## 2012-01-28 ENCOUNTER — Ambulatory Visit (INDEPENDENT_AMBULATORY_CARE_PROVIDER_SITE_OTHER): Payer: Medicaid Other | Admitting: *Deleted

## 2012-01-28 DIAGNOSIS — B20 Human immunodeficiency virus [HIV] disease: Secondary | ICD-10-CM

## 2012-01-28 DIAGNOSIS — IMO0002 Reserved for concepts with insufficient information to code with codable children: Secondary | ICD-10-CM

## 2012-01-28 DIAGNOSIS — Z01419 Encounter for gynecological examination (general) (routine) without abnormal findings: Secondary | ICD-10-CM | POA: Insufficient documentation

## 2012-01-28 DIAGNOSIS — Z124 Encounter for screening for malignant neoplasm of cervix: Secondary | ICD-10-CM

## 2012-01-28 DIAGNOSIS — R87612 Low grade squamous intraepithelial lesion on cytologic smear of cervix (LGSIL): Secondary | ICD-10-CM

## 2012-01-28 LAB — CBC WITH DIFFERENTIAL/PLATELET
Basophils Absolute: 0 10*3/uL (ref 0.0–0.1)
Basophils Relative: 0 % (ref 0–1)
Eosinophils Absolute: 0.1 10*3/uL (ref 0.0–0.7)
Eosinophils Relative: 2 % (ref 0–5)
Lymphs Abs: 3.1 10*3/uL (ref 0.7–4.0)
MCH: 32.6 pg (ref 26.0–34.0)
MCV: 96 fL (ref 78.0–100.0)
Neutrophils Relative %: 55 % (ref 43–77)
Platelets: 339 10*3/uL (ref 150–400)
RBC: 4.54 MIL/uL (ref 3.87–5.11)
RDW: 14.1 % (ref 11.5–15.5)

## 2012-01-28 LAB — COMPREHENSIVE METABOLIC PANEL
ALT: 13 U/L (ref 0–35)
Albumin: 4.5 g/dL (ref 3.5–5.2)
Alkaline Phosphatase: 102 U/L (ref 39–117)
CO2: 25 mEq/L (ref 19–32)
Glucose, Bld: 80 mg/dL (ref 70–99)
Potassium: 4.2 mEq/L (ref 3.5–5.3)
Sodium: 138 mEq/L (ref 135–145)
Total Protein: 7.9 g/dL (ref 6.0–8.3)

## 2012-01-28 LAB — T-HELPER CELL (CD4) - (RCID CLINIC ONLY): CD4 T Cell Abs: 1010 uL (ref 400–2700)

## 2012-01-28 NOTE — Progress Notes (Addendum)
  Subjective:     [name] is a 31 y.o. woman who comes in today for a  pap smear only. Her most recent annual exam was on 2009. Her most recent Pap smear was on 2009 and showed abnormal results. Previous abnormal Pap smears: {yes 2009. Contraception: Depo shot.  Objective:    There were no vitals taken for this visit. Pelvic Exam:  Pap smear obtained.   Assessment:    Screening pap smear.   Plan:    Follow up in one year, or as indicated by Pap results.  Pt given educational materials re: HIV and women, BSE, nutrition, diet, exercise, heart disease, PAP smears, ACA and self-esteem.  Given condoms.  PAP results abnormal.  Will refer to [facility] for f/u.

## 2012-02-02 LAB — HIV-1 RNA ULTRAQUANT REFLEX TO GENTYP+: HIV-1 RNA Quant, Log: 2.5 {Log} — ABNORMAL HIGH (ref <1.30)

## 2012-02-04 ENCOUNTER — Ambulatory Visit: Payer: Medicaid Other

## 2012-02-07 NOTE — Addendum Note (Signed)
Addended by: Jennet Maduro D on: 02/07/2012 09:35 AM   Modules accepted: Orders

## 2012-02-08 ENCOUNTER — Encounter: Payer: Self-pay | Admitting: Physician Assistant

## 2012-02-10 ENCOUNTER — Ambulatory Visit (INDEPENDENT_AMBULATORY_CARE_PROVIDER_SITE_OTHER): Payer: Medicaid Other | Admitting: Internal Medicine

## 2012-02-10 ENCOUNTER — Encounter: Payer: Self-pay | Admitting: Internal Medicine

## 2012-02-10 VITALS — BP 129/80 | HR 80 | Temp 97.7°F | Ht 63.5 in | Wt 180.0 lb

## 2012-02-10 DIAGNOSIS — B20 Human immunodeficiency virus [HIV] disease: Secondary | ICD-10-CM

## 2012-02-10 NOTE — Assessment & Plan Note (Signed)
She is doing great on her salvage regimen. I encouraged continued compliance and reminded her that she can remain on this regimen for decades is on she continues to take them on a daily basis. I did remind her to use condoms with all sexual activity. She did ask about pregnancy and I did state that there are possibilities for getting pregnant is on she plans and if her future partner does take prophylaxis and she is undetectable. She was encouraged to discuss this with an obstetrician prior to any consideration of pregnancy and with her partner. At this time though she has no active female partner. She can continue the same regimen if she does want to become pregnant. I will have her return in 2 months to assure that her virus continues to be well-controlled.

## 2012-02-10 NOTE — Progress Notes (Signed)
  Subjective:    Patient ID: [name], female    DOB: [birth year], 31 y.o.   MRN: [id number]  HPI She comes in here for followup of her HIV. She has a history of significant resistance mutations and had been out of clinic for many years. She did return earlier this year and had been continuing on a regimen of Combivir, tenofovir and Isentress.  Based on narcotic mutations and current mutations at that time, she was started on a regimen of boosted Prezista and Truvada along with Isentress. She is now here for her first visit following starting the regimen. She reports excellent adherence and only one missed dose of the evening I Isentress. She has good tolerance and is pleased with the regimen. Her only complaint is some weight gain. She also does continue to see her primary care physician.   Review of Systems  Constitutional: Negative for fever, appetite change, fatigue and unexpected weight change.  HENT: Negative for sore throat and trouble swallowing.   Respiratory: Negative for cough, shortness of breath and wheezing.   Cardiovascular: Negative for chest pain, palpitations and leg swelling.  Gastrointestinal: Negative for nausea, abdominal pain and diarrhea.  Musculoskeletal: Negative for myalgias, joint swelling and arthralgias.  Skin: Negative for rash.  Neurological: Negative for dizziness, weakness and headaches.  Hematological: Negative for adenopathy.  Psychiatric/Behavioral: Negative for dysphoric mood. The patient is not nervous/anxious.        Objective:   Physical Exam  Constitutional: She appears well-developed and well-nourished. No distress.  HENT:  Mouth/Throat: Oropharynx is clear and moist. No oropharyngeal exudate.  Cardiovascular: Normal rate, regular rhythm and normal heart sounds.  Exam reveals no gallop and no friction rub.   No murmur heard. Pulmonary/Chest: Effort normal and breath sounds normal. No respiratory distress. She has no wheezes. She has no  rales.  Abdominal: Soft. Bowel sounds are normal. She exhibits no distension. There is no tenderness. There is no rebound.  Lymphadenopathy:    She has no cervical adenopathy.  Skin: Skin is warm. No rash noted. No erythema.  Psychiatric: She has a normal mood and affect. Her behavior is normal.          Assessment & Plan:

## 2012-02-23 NOTE — Progress Notes (Signed)
Done

## 2012-03-08 ENCOUNTER — Encounter: Payer: Medicaid Other | Admitting: Physician Assistant

## 2012-03-30 ENCOUNTER — Encounter (HOSPITAL_COMMUNITY): Payer: Self-pay | Admitting: Emergency Medicine

## 2012-03-30 ENCOUNTER — Emergency Department (HOSPITAL_COMMUNITY)
Admission: EM | Admit: 2012-03-30 | Discharge: 2012-03-30 | Disposition: A | Discharge status: Home / Self Care | Payer: Medicaid Other | Attending: Emergency Medicine | Admitting: Emergency Medicine

## 2012-03-30 ENCOUNTER — Other Ambulatory Visit: Payer: Self-pay | Admitting: *Deleted

## 2012-03-30 DIAGNOSIS — B20 Human immunodeficiency virus [HIV] disease: Secondary | ICD-10-CM

## 2012-03-30 DIAGNOSIS — B373 Candidiasis of vulva and vagina: Secondary | ICD-10-CM

## 2012-03-30 DIAGNOSIS — B3731 Acute candidiasis of vulva and vagina: Secondary | ICD-10-CM | POA: Insufficient documentation

## 2012-03-30 LAB — POCT PREGNANCY, URINE: Preg Test, Ur: NEGATIVE

## 2012-03-30 LAB — URINALYSIS, ROUTINE W REFLEX MICROSCOPIC
Glucose, UA: NEGATIVE mg/dL
Hgb urine dipstick: NEGATIVE
Ketones, ur: NEGATIVE mg/dL
Protein, ur: NEGATIVE mg/dL

## 2012-03-30 LAB — URINE MICROSCOPIC-ADD ON

## 2012-03-30 MED ORDER — RITONAVIR 100 MG PO TABS: 100.0000 mg | ORAL_TABLET | Freq: Every day | ORAL | Status: DC

## 2012-03-30 MED ORDER — METRONIDAZOLE 500 MG PO TABS: 500.0000 mg | ORAL_TABLET | Freq: Two times a day (BID) | ORAL | Status: AC

## 2012-03-30 MED ORDER — DARUNAVIR ETHANOLATE 800 MG PO TABS: 800.0000 mg | ORAL_TABLET | Freq: Every day | ORAL | Status: DC

## 2012-03-30 MED ORDER — EMTRICITABINE-TENOFOVIR DF 200-300 MG PO TABS: 1.0000 | ORAL_TABLET | Freq: Every day | ORAL | Status: DC

## 2012-03-30 MED ORDER — RALTEGRAVIR POTASSIUM 400 MG PO TABS: 400.0000 mg | ORAL_TABLET | Freq: Two times a day (BID) | ORAL | Status: DC

## 2012-03-30 MED ORDER — FLUCONAZOLE 200 MG PO TABS: 2000.0000 mg | ORAL_TABLET | Freq: Once | ORAL | Status: DC

## 2012-03-30 MED ORDER — FLUCONAZOLE 100 MG PO TABS
150.0000 mg | ORAL_TABLET | Freq: Once | ORAL | Status: AC
  Administered 2012-03-30: 150 mg via ORAL
  Filled 2012-03-30: qty 2

## 2012-03-30 NOTE — Telephone Encounter (Signed)
Printed rxes for Dr. Bonnetta Barry signature.

## 2012-03-30 NOTE — Discharge Instructions (Signed)
Candidal Vulvovaginitis Candidal vulvovaginitis is an infection of the vagina and vulva. The vulva is the skin around the opening of the vagina. This may cause itching and discomfort in and around the vagina.  HOME CARE  Only take medicine as told by your doctor.   Do not have sex (intercourse) until the infection is healed or as told by your doctor.   Practice safe sex.   Tell your sex partner about your infection.   Do not douche or use tampons.   Wear cotton underwear. Do not wear tight pants or panty hose.   Eat yogurt. This may help treat and prevent yeast infections.  GET HELP RIGHT AWAY IF:   You have a fever.   Your problems get worse during treatment or do not get better in 3 days.   You have discomfort, irritation, or itching in your vagina or vulva area.   You have pain after sex.   You start to get belly (abdominal) pain.  MAKE SURE YOU:  Understand these instructions.   Will watch your condition.   Will get help right away if you are not doing well or get worse.  Document Released: 10/29/2008 Document Revised: 07/22/2011 Document Reviewed: 10/29/2008 [facility] Patient Information 2012 [place], [place].Vaginitis Vaginitis is when the vagina is sore, puffy (swollen), and red (inflamed). HOME CARE  Take your medicine as told. Finish them even if you start to feel better.   Do not have sex (intercourse) until treatment is done or as told by your doctor.   Take warm baths or sit in warm water (sitz bath).   Do not douche.   Do not use tampons, especially scented ones.   Wear cotton underwear.   Do not wear tight pants or pantyhose.   Tell your sex partner that you have a yeast infection. Your partner should see a doctor if symptoms appear, such as a mild rash or itching.   Your sex partner should be treated if your infection is hard to get rid of.   Use a cream to stop the itching. Make sure it is approved by your doctor.  GET HELP RIGHT AWAY IF:    The infection does not go away with treatment.   You have a temperature by mouth above 102 F (38.9 C).   You have belly (abdominal) pain.   You have bleeding from the vagina.  MAKE SURE YOU:   Understand these instructions.   Will watch this condition.   Will get help right away if you are not doing well or get worse.  Document Released: 10/29/2008 Document Revised: 07/22/2011 Document Reviewed: 10/29/2008 [facility] Patient Information 2012 [place], [place].  Take Flagyl as directed. If not improved in a few days followup with primary care Dr. Urinalysis today without significant findings.

## 2012-03-30 NOTE — ED Notes (Signed)
Pt c/o vag discharge for several days.  St's was recently on antibiotics for sinus infection and thinks she has a yeast infection.  Also c/o itching

## 2012-03-30 NOTE — ED Provider Notes (Signed)
History     CSN: [id number]  Arrival date & time 03/30/12  1626   First MD Initiated Contact with Patient 03/30/12 1715      Chief Complaint  Patient presents with  . Vaginal Discharge    (Consider location/radiation/quality/duration/timing/severity/associated sxs/prior treatment) Patient is a 31 y.o. female presenting with vaginal discharge. The history is provided by the patient.  Vaginal Discharge Pertinent negatives include no chest pain, no abdominal pain, no headaches and no shortness of breath.   patiently recent treated with amoxicillin for a sinus infection patient states they always need to give her Diflucan when she gets an antibiotic it did not occur she's concerned about developing a yeast infection or bacterial vaginosis explained to her that affect or vegetation appetite with the antibiotic. Patient has a whitish to brownish discharge no bowel pain no pelvic pain no nausea no vomiting no fevers. Patient has had similar events occur when she been on antibiotics before.  Past Medical History  Diagnosis Date  . HIV infection     History reviewed. No pertinent past surgical history.  No family history on file.  History  Substance Use Topics  . Smoking status: Never Smoker   . Smokeless tobacco: Never Used  . Alcohol Use: Yes     occasional    OB History    Grav Para Term Preterm Abortions TAB SAB Ect Mult Living                  Review of Systems  Constitutional: Negative for fever and chills.  HENT: Positive for congestion.   Respiratory: Negative for shortness of breath.   Cardiovascular: Negative for chest pain.  Gastrointestinal: Negative for nausea, vomiting and abdominal pain.  Genitourinary: Positive for vaginal discharge. Negative for dysuria.  Skin: Negative for rash.  Neurological: Negative for headaches.  Hematological: Does not bruise/bleed easily.    Allergies  Sulfamethoxazole w-trimethoprim  Home Medications   Current Outpatient Rx   Name Route Sig Dispense Refill  . BECLOMETHASONE DIPROPIONATE 40 MCG/ACT IN AERS Inhalation Inhale 2 puffs into the lungs 2 (two) times daily.    Marland Kitchen DARUNAVIR ETHANOLATE 400 MG PO TABS Oral Take 800 mg by mouth daily with breakfast.    . EMTRICITABINE-TENOFOVIR 200-300 MG PO TABS Oral Take 1 tablet by mouth daily.    Marland Kitchen FLUOXETINE HCL 40 MG PO CAPS Oral Take 60 mg by mouth daily.    Marland Kitchen LITHIUM CARBONATE 600 MG PO CAPS Oral Take 600 mg by mouth at bedtime.    Marland Kitchen LITHIUM CARBONATE 300 MG PO CAPS Oral Take 300 mg by mouth every morning.    Marland Kitchen QUETIAPINE FUMARATE 100 MG PO TABS Oral Take 100 mg by mouth at bedtime.    Marland Kitchen RALTEGRAVIR POTASSIUM 400 MG PO TABS Oral Take 400 mg by mouth 2 (two) times daily.    Marland Kitchen RITONAVIR 100 MG PO CAPS Oral Take 100 mg by mouth daily with breakfast.    . METRONIDAZOLE 500 MG PO TABS Oral Take 1 tablet (500 mg total) by mouth 2 (two) times daily. 14 tablet 0    BP 114/66  Pulse 70  Temp 98.6 F (37 C) (Oral)  Resp 16  SpO2 100%  Physical Exam  Nursing note and vitals reviewed. Constitutional: She is oriented to person, place, and time. She appears well-developed and well-nourished.  HENT:  Head: Normocephalic and atraumatic.  Mouth/Throat: Oropharynx is clear and moist.  Eyes: Conjunctivae and EOM are normal. Pupils are equal, round, and  reactive to light.  Neck: Normal range of motion. Neck supple.  Cardiovascular: Normal rate, regular rhythm and normal heart sounds.   No murmur heard. Pulmonary/Chest: Effort normal and breath sounds normal.  Abdominal: Soft. Bowel sounds are normal. There is no tenderness.  Musculoskeletal: Normal range of motion.  Neurological: She is alert and oriented to person, place, and time. No cranial nerve deficit. She exhibits normal muscle tone. Coordination normal.  Skin: Skin is warm. No rash noted.    ED Course  Procedures (including critical care time)  Labs Reviewed  URINALYSIS, ROUTINE W REFLEX MICROSCOPIC - Abnormal;  Notable for the following:    Color, Urine AMBER (*)  BIOCHEMICALS MAY BE AFFECTED BY COLOR   APPearance HAZY (*)     Bilirubin Urine SMALL (*)     Leukocytes, UA TRACE (*)     All other components within normal limits  URINE MICROSCOPIC-ADD ON - Abnormal; Notable for the following:    Squamous Epithelial / LPF MANY (*)     Bacteria, UA FEW (*)     Casts HYALINE CASTS (*)     All other components within normal limits  POCT PREGNANCY, URINE   No results found. Results for orders placed during the hospital encounter of 03/30/12  URINALYSIS, ROUTINE W REFLEX MICROSCOPIC      Component Value Range   Color, Urine AMBER (*) YELLOW   APPearance HAZY (*) CLEAR   Specific Gravity, Urine 1.024  1.005 - 1.030   pH 6.0  5.0 - 8.0   Glucose, UA NEGATIVE  NEGATIVE mg/dL   Hgb urine dipstick NEGATIVE  NEGATIVE   Bilirubin Urine SMALL (*) NEGATIVE   Ketones, ur NEGATIVE  NEGATIVE mg/dL   Protein, ur NEGATIVE  NEGATIVE mg/dL   Urobilinogen, UA 1.0  0.0 - 1.0 mg/dL   Nitrite NEGATIVE  NEGATIVE   Leukocytes, UA TRACE (*) NEGATIVE  POCT PREGNANCY, URINE      Component Value Range   Preg Test, Ur NEGATIVE  NEGATIVE  URINE MICROSCOPIC-ADD ON      Component Value Range   Squamous Epithelial / LPF MANY (*) RARE   WBC, UA 3-6  <3 WBC/hpf   Bacteria, UA FEW (*) RARE   Casts HYALINE CASTS (*) NEGATIVE   Urine-Other MUCOUS PRESENT      1. Vaginal yeast infection       MDM   Patient recently on antibiotics frequently ends up needing Diflucan after having again a bike for for sinus infection. Patient does not 1 pelvic examination here will treat with Diflucan here for the yeast infection states also that sometimes the past she's had a bacterial vaginosis we'll send her home with Flagyl 500 mg twice a day for 7 days. Patient will followup with her primary care Dr. if not improved in a few days.       Shelda Jakes, MD 03/30/12 [phone]

## 2012-03-31 LAB — POCT PREGNANCY, URINE: Preg Test, Ur: NEGATIVE

## 2012-04-03 ENCOUNTER — Telehealth: Payer: Self-pay | Admitting: Infectious Diseases

## 2012-04-03 NOTE — Telephone Encounter (Signed)
Faxed and mailed applications to [employer], [employer], [employer] and the Support Program today.

## 2012-04-06 ENCOUNTER — Other Ambulatory Visit: Payer: Medicaid Other

## 2012-04-06 ENCOUNTER — Ambulatory Visit: Payer: Self-pay

## 2012-04-06 ENCOUNTER — Telehealth: Payer: Self-pay | Admitting: Infectious Diseases

## 2012-04-06 NOTE — Telephone Encounter (Signed)
[name] has been approved for her Prezista and Norvir through 04-05-13.  Need a notarized copy of her income sheet to send to [place].

## 2012-04-10 ENCOUNTER — Telehealth: Payer: Self-pay | Admitting: *Deleted

## 2012-04-10 ENCOUNTER — Telehealth: Payer: Self-pay | Admitting: Infectious Diseases

## 2012-04-10 NOTE — Telephone Encounter (Signed)
Called and left a message for Ms Donahoe.  She needs to come by so we can notarize her letter of income.  She also needs to bring a photo ID with her.

## 2012-04-10 NOTE — Telephone Encounter (Signed)
Called patient and left voice mail that her PA medication, Norvir, has arrived and is ready for pick up. Wendall Mola CMA

## 2012-04-20 ENCOUNTER — Telehealth: Payer: Self-pay | Admitting: *Deleted

## 2012-04-20 ENCOUNTER — Ambulatory Visit: Payer: Self-pay

## 2012-04-20 ENCOUNTER — Ambulatory Visit: Payer: Medicaid Other | Admitting: Internal Medicine

## 2012-04-20 NOTE — Telephone Encounter (Signed)
Called and left patient a voice mail to call the clinic and reschedule her lab and f/u appt.  She no showed both. Wendall Mola

## 2012-04-25 ENCOUNTER — Telehealth: Payer: Self-pay | Admitting: Infectious Diseases

## 2012-04-25 NOTE — Telephone Encounter (Signed)
Mailed original prescription to Support Program today for [name]' Isentress

## 2012-05-03 ENCOUNTER — Telehealth: Payer: Self-pay | Admitting: Infectious Diseases

## 2012-05-03 NOTE — Telephone Encounter (Signed)
Received fax from the Support Program.  [name] has been approved for her medication.  I will call the support program and let them know she now has medicaid.

## 2012-07-05 ENCOUNTER — Other Ambulatory Visit: Payer: Medicaid Other

## 2012-07-20 ENCOUNTER — Other Ambulatory Visit: Payer: Medicaid Other

## 2012-07-20 ENCOUNTER — Ambulatory Visit: Payer: Medicaid Other | Admitting: Internal Medicine

## 2012-07-20 DIAGNOSIS — B20 Human immunodeficiency virus [HIV] disease: Secondary | ICD-10-CM

## 2012-07-20 LAB — COMPLETE METABOLIC PANEL WITH GFR
ALT: 20 U/L (ref 0–35)
AST: 18 U/L (ref 0–37)
CO2: 25 mEq/L (ref 19–32)
Calcium: 9.7 mg/dL (ref 8.4–10.5)
Chloride: 104 mEq/L (ref 96–112)
Creat: 1.12 mg/dL — ABNORMAL HIGH (ref 0.50–1.10)
GFR, Est African American: 75 mL/min
Potassium: 4.6 mEq/L (ref 3.5–5.3)
Sodium: 135 mEq/L (ref 135–145)
Total Protein: 7.8 g/dL (ref 6.0–8.3)

## 2012-07-20 LAB — CBC WITH DIFFERENTIAL/PLATELET
Basophils Absolute: 0 10*3/uL (ref 0.0–0.1)
Lymphocytes Relative: 48 % — ABNORMAL HIGH (ref 12–46)
Lymphs Abs: 3.6 10*3/uL (ref 0.7–4.0)
MCV: 92.9 fL (ref 78.0–100.0)
Neutro Abs: 3.4 10*3/uL (ref 1.7–7.7)
Neutrophils Relative %: 44 % (ref 43–77)
Platelets: 290 10*3/uL (ref 150–400)
RBC: 4.78 MIL/uL (ref 3.87–5.11)
RDW: 14.5 % (ref 11.5–15.5)
WBC: 7.5 10*3/uL (ref 4.0–10.5)

## 2012-07-21 LAB — HIV-1 RNA ULTRAQUANT REFLEX TO GENTYP+
HIV 1 RNA Quant: 80 copies/mL — ABNORMAL HIGH (ref <20)
HIV-1 RNA Quant, Log: 1.9 {Log} — ABNORMAL HIGH (ref <1.30)

## 2012-07-21 LAB — T-HELPER CELL (CD4) - (RCID CLINIC ONLY): CD4 % Helper T Cell: 28 % — ABNORMAL LOW (ref 33–55)

## 2012-08-02 ENCOUNTER — Ambulatory Visit (INDEPENDENT_AMBULATORY_CARE_PROVIDER_SITE_OTHER): Payer: Medicaid Other | Admitting: Internal Medicine

## 2012-08-02 ENCOUNTER — Encounter: Payer: Self-pay | Admitting: Internal Medicine

## 2012-08-02 VITALS — BP 129/84 | HR 92 | Temp 98.3°F | Ht 63.5 in | Wt 180.0 lb

## 2012-08-02 DIAGNOSIS — Z23 Encounter for immunization: Secondary | ICD-10-CM

## 2012-08-02 DIAGNOSIS — B20 Human immunodeficiency virus [HIV] disease: Secondary | ICD-10-CM

## 2012-08-02 DIAGNOSIS — A6000 Herpesviral infection of urogenital system, unspecified: Secondary | ICD-10-CM

## 2012-08-02 MED ORDER — VALACYCLOVIR HCL 500 MG PO TABS: 500.0000 mg | ORAL_TABLET | Freq: Every day | ORAL | Status: DC

## 2012-08-03 DIAGNOSIS — A6000 Herpesviral infection of urogenital system, unspecified: Secondary | ICD-10-CM | POA: Insufficient documentation

## 2012-08-03 NOTE — Assessment & Plan Note (Signed)
She has again had 2 outbreaks this year. I offered her suppressive therapy with Valtrex daily and she will start that.

## 2012-08-03 NOTE — Assessment & Plan Note (Signed)
She is doing well and will continue with the same. I will follow up in 4 months due to the resistant virus.

## 2012-08-03 NOTE — Progress Notes (Signed)
  Subjective:    Patient ID: [name], female    DOB: April 18, 1981, 31 y.o.   MRN: [id number]  HPI She comes in here for followup of her HIV. She has a history of significant resistance mutations and had been out of clinic for many years. She did return earlier this year and had been continuing on a regimen of Combivir, tenofovir and Isentress. Based on resistance mutations, she was started on a regimen of boosted Prezista and Truvada along with Isentress.  She reports excellent adherence with no missed doses. Her viral load is now less than 100 she is doing well.    Review of Systems  Constitutional: Negative for fever, appetite change and fatigue.  HENT: Negative for sore throat and trouble swallowing.   Respiratory: Negative for cough and shortness of breath.   Cardiovascular: Negative for chest pain, palpitations and leg swelling.  Gastrointestinal: Negative for nausea, abdominal pain and diarrhea.  Neurological: Negative for dizziness and headaches.       Objective:   Physical Exam  Constitutional: She appears well-developed and well-nourished.  HENT:  Mouth/Throat: Oropharynx is clear and moist. No oropharyngeal exudate.  Cardiovascular: Normal rate, regular rhythm and normal heart sounds.  Exam reveals no gallop and no friction rub.   No murmur heard. Pulmonary/Chest: Effort normal and breath sounds normal. No respiratory distress. She has no wheezes. She has no rales.          Assessment & Plan:

## 2012-09-07 ENCOUNTER — Other Ambulatory Visit: Payer: Self-pay

## 2012-11-09 ENCOUNTER — Telehealth: Payer: Self-pay | Admitting: *Deleted

## 2012-11-09 NOTE — Telephone Encounter (Signed)
Pneumonia vaccine not due until 03/2013.  Pt scheduled for partial hysterectomy on 12/04/12.  Requesting to reschedule [facility] lab and MD appts for prior to surgery.  Appts rescheduled.  Pt c/o sinus problems for the past 2 weeks.  Requesting work-in visit.  To see Dr. Daiva Eves 11/13/12 @ 3:15 PM.  Labs can be drawn at this visit.  Pt has been taking Claritin without results.  RN advised pt to try another OTC sinus product to see if there is improvement and to continue to drink plenty of liquids.  Pt verbalized understanding.

## 2012-11-13 ENCOUNTER — Ambulatory Visit (INDEPENDENT_AMBULATORY_CARE_PROVIDER_SITE_OTHER): Payer: Medicaid Other | Admitting: Infectious Disease

## 2012-11-13 ENCOUNTER — Encounter: Payer: Self-pay | Admitting: Infectious Disease

## 2012-11-13 VITALS — BP 122/85 | HR 89 | Temp 98.7°F | Ht 63.0 in | Wt 188.0 lb

## 2012-11-13 DIAGNOSIS — J329 Chronic sinusitis, unspecified: Secondary | ICD-10-CM

## 2012-11-13 DIAGNOSIS — Z9119 Patient's noncompliance with other medical treatment and regimen: Secondary | ICD-10-CM

## 2012-11-13 DIAGNOSIS — J309 Allergic rhinitis, unspecified: Secondary | ICD-10-CM

## 2012-11-13 DIAGNOSIS — B20 Human immunodeficiency virus [HIV] disease: Secondary | ICD-10-CM

## 2012-11-13 MED ORDER — DARUNAVIR ETHANOLATE 800 MG PO TABS: 800.0000 mg | ORAL_TABLET | Freq: Every day | ORAL | Status: DC

## 2012-11-13 MED ORDER — FLUNISOLIDE 29 MCG/ACT NA SOLN: 2.0000 | Freq: Every day | NASAL | Status: DC

## 2012-11-13 MED ORDER — AMOXICILLIN-POT CLAVULANATE 875-125 MG PO TABS: 1.0000 | ORAL_TABLET | Freq: Two times a day (BID) | ORAL | Status: DC

## 2012-11-13 NOTE — Patient Instructions (Signed)
I would like you to buy OTC pseudophedrine (one you need to show your drivers licence for this to pharmacy) and take this as directed  I would also like you to buy OTC "Afrin" and use inhale two puffs in each nostril at bedtime if you are congested  You can also start Flunisolide two sprays in each nostril at night time  Take augmentin 1 tablet twice daily for 2 weeks  When you meet with Dr. Luciana Axe I would consider changing your isentress for Tivicay

## 2012-11-13 NOTE — Progress Notes (Signed)
Subjective:    Patient ID: [name], female    DOB: 1981/01/27, 32 y.o.   MRN: [id number]  HPI  [name] is a 32 year old African American lady with HIV intermittently compliant with ARV and with NRTI, NNRTI  M184V, K103N,V106I Y115F,  PI mutations D60E,L63P currently on Norvir boosted PRezista, truvada and twice daily isentress. She is also on a whole host of medications for her bipolar disorder.   She asked for our pharmacist to check all of the drug drug interactions with her ARV and Feliz Beam (Pharm D) has done so.  She also has an specific complaint of recurrent sinusitis that has persisted since December. She has been rx with augmentin in December then later with prednisone but ssx have persisted. She co congestion and maxillary sinus tenderness. She is without fevers of systemic symptoms.   She claims to be adherent to her ARVs.  I spent greater than 45 minutes with the patient including greater than 50% of time in face to face counsel of the patient and in coordination of their care.       Review of Systems  Constitutional: Negative for fever, chills, diaphoresis, activity change, appetite change, fatigue and unexpected weight change.  HENT: Positive for congestion and rhinorrhea. Negative for sore throat, sneezing, trouble swallowing and sinus pressure.   Eyes: Negative for photophobia and visual disturbance.  Respiratory: Negative for cough, chest tightness, shortness of breath, wheezing and stridor.   Cardiovascular: Negative for chest pain, palpitations and leg swelling.  Gastrointestinal: Negative for nausea, vomiting, abdominal pain, diarrhea, constipation, blood in stool, abdominal distention and anal bleeding.  Genitourinary: Negative for dysuria, hematuria, flank pain and difficulty urinating.  Musculoskeletal: Negative for myalgias, back pain, joint swelling, arthralgias and gait problem.  Skin: Negative for color change, pallor, rash and wound.   Neurological: Negative for dizziness, tremors, weakness and light-headedness.  Hematological: Negative for adenopathy. Does not bruise/bleed easily.  Psychiatric/Behavioral: Negative for behavioral problems, confusion, sleep disturbance, dysphoric mood, decreased concentration and agitation.       Objective:   Physical Exam  Constitutional: She is oriented to person, place, and time. She appears well-developed and well-nourished. No distress.  HENT:  Head: Normocephalic and atraumatic.  Nose: Mucosal edema and rhinorrhea present. Right sinus exhibits maxillary sinus tenderness and frontal sinus tenderness. Left sinus exhibits maxillary sinus tenderness and frontal sinus tenderness.  Mouth/Throat: Oropharynx is clear and moist. No oropharyngeal exudate.  Eyes: Conjunctivae and EOM are normal. Pupils are equal, round, and reactive to light. No scleral icterus.  Neck: Normal range of motion. Neck supple. No JVD present.  Cardiovascular: Normal rate, regular rhythm and normal heart sounds.  Exam reveals no gallop and no friction rub.   No murmur heard. Pulmonary/Chest: Effort normal and breath sounds normal. No respiratory distress.  Abdominal: She exhibits no distension. There is no tenderness.  Musculoskeletal: She exhibits no edema and no tenderness.  Lymphadenopathy:    She has no cervical adenopathy.  Neurological: She is alert and oriented to person, place, and time. Coordination normal.  Skin: Skin is warm and dry. She is not diaphoretic. No erythema. No pallor.  Psychiatric: She has a normal mood and affect. Her behavior is normal. Judgment and thought content normal.          Assessment & Plan:  Sinusitis: a portion of this may be due to allergies. I am asking her to try otc pseudophed, inhaled afrin and I will giver her flunisolide steroid spray (not fluticasone  due to risk for AI with norvir) and give her another course of augmentin x 14 days  HIV: Would consider swapping  in Tivicay for Isentress for dosing simplification, checking labs today including integrase genotype  Allergic Rhinitis: is listed in her problem list and I suspect this is underyling problem with re to her chronic and recurrent sinus problems

## 2012-11-15 ENCOUNTER — Other Ambulatory Visit: Payer: Medicaid Other

## 2012-11-15 LAB — HIV-1 RNA ULTRAQUANT REFLEX TO GENTYP+
HIV 1 RNA Quant: 37 copies/mL — ABNORMAL HIGH (ref <20)
HIV-1 RNA Quant, Log: 1.57 {Log} — ABNORMAL HIGH (ref <1.30)

## 2012-11-21 ENCOUNTER — Encounter (HOSPITAL_COMMUNITY): Payer: Self-pay | Admitting: Pharmacist

## 2012-11-22 LAB — HIV-1 INTEGRASE GENOTYPE

## 2012-11-27 ENCOUNTER — Encounter (HOSPITAL_COMMUNITY)
Admission: RE | Admit: 2012-11-27 | Discharge: 2012-11-27 | Disposition: A | Discharge status: Home / Self Care | Payer: Medicaid Other | Source: Ambulatory Visit | Attending: Obstetrics & Gynecology | Admitting: Obstetrics & Gynecology

## 2012-11-27 ENCOUNTER — Ambulatory Visit (INDEPENDENT_AMBULATORY_CARE_PROVIDER_SITE_OTHER): Payer: Medicaid Other | Admitting: Internal Medicine

## 2012-11-27 ENCOUNTER — Encounter (HOSPITAL_COMMUNITY): Payer: Self-pay

## 2012-11-27 ENCOUNTER — Encounter: Payer: Self-pay | Admitting: Internal Medicine

## 2012-11-27 VITALS — BP 138/86 | HR 93 | Temp 98.1°F | Ht 63.75 in | Wt 190.0 lb

## 2012-11-27 DIAGNOSIS — B20 Human immunodeficiency virus [HIV] disease: Secondary | ICD-10-CM

## 2012-11-27 HISTORY — DX: Anxiety disorder, unspecified: F41.9

## 2012-11-27 HISTORY — DX: Major depressive disorder, single episode, unspecified: F32.9

## 2012-11-27 HISTORY — DX: Depression, unspecified: F32.A

## 2012-11-27 HISTORY — DX: Myoneural disorder, unspecified: G70.9

## 2012-11-27 HISTORY — DX: Gastro-esophageal reflux disease without esophagitis: K21.9

## 2012-11-27 HISTORY — DX: Type 2 diabetes mellitus without complications: E11.9

## 2012-11-27 HISTORY — DX: Unspecified asthma, uncomplicated: J45.909

## 2012-11-27 LAB — CBC
HCT: 39.8 % (ref 36.0–46.0)
MCHC: 34.9 g/dL (ref 30.0–36.0)
MCV: 93.6 fL (ref 78.0–100.0)
Platelets: 274 10*3/uL (ref 150–400)
RDW: 14.4 % (ref 11.5–15.5)
WBC: 12.7 10*3/uL — ABNORMAL HIGH (ref 4.0–10.5)

## 2012-11-27 LAB — SURGICAL PCR SCREEN: Staphylococcus aureus: NEGATIVE

## 2012-11-27 MED ORDER — DOLUTEGRAVIR SODIUM 50 MG PO TABS: 50.0000 mg | ORAL_TABLET | Freq: Every day | ORAL | Status: DC

## 2012-11-27 MED ORDER — BECLOMETHASONE DIPROPIONATE 40 MCG/ACT IN AERS: 2.0000 | INHALATION_SPRAY | Freq: Two times a day (BID) | RESPIRATORY_TRACT | Status: DC

## 2012-11-27 NOTE — Progress Notes (Signed)
  Subjective:    Patient ID: [name], female    DOB: [birth year], 32 y.o.   MRN: [id number]  HPI She comes in for followup of her HIV. She has a highly resistant virus and is on a salvage regimen of tivicay, Prezista, Norvir and Truvada. She was seen about 2 weeks ago by my partner and reported good compliance. She had labs done at that time which should show a nearly undetectable viral load.  Her last CD4 count, done in December of 2013, was over 1000. She feels well on her regimen and is pleased with the results. She is getting a hysterectomy.     Review of Systems  Constitutional: Negative for fever, fatigue and unexpected weight change.  HENT: Negative for sore throat and trouble swallowing.   Respiratory: Negative for shortness of breath.   Gastrointestinal: Negative for nausea, abdominal pain and diarrhea.  Musculoskeletal: Negative for myalgias, joint swelling and arthralgias.  Skin: Negative for rash.  Psychiatric/Behavioral: Negative for dysphoric mood.       Objective:   Physical Exam  Constitutional: She appears well-developed and well-nourished. No distress.  HENT:  Mouth/Throat: Oropharynx is clear and moist. No oropharyngeal exudate.  Cardiovascular: Normal rate, regular rhythm and normal heart sounds.  Exam reveals no gallop and no friction rub.   No murmur heard. Pulmonary/Chest: Effort normal and breath sounds normal. No respiratory distress. She has no wheezes. She has no rales.          Assessment & Plan:

## 2012-11-27 NOTE — Patient Instructions (Signed)
Your procedure is scheduled on:12/04/12  Enter through the Main Entrance at :1130 am Pick up desk phone and dial [phone] and inform us of your arrival.  Please call [phone] if you have any problems the morning of surgery.  Remember: Do not eat after midnight:Sunday Clear liquids ok until 9am on Monday....water, sodas, jello, broth  Take these meds the morning of surgery with a sip of water: all usual am meds but no vitamins or supplements- bring inhaler to hospital  DO NOT wear jewelry, eye make-up, lipstick,body lotion, or dark fingernail polish.  If you are to be admitted after surgery, leave suitcase in car until your room has been assigned. Patients discharged on the day of surgery will not be allowed to drive home.

## 2012-11-27 NOTE — Assessment & Plan Note (Signed)
She is doing well on this regimen and no new mutations detected so she had a low viral load. She will come back in about 2 months to assure continued suppression. I encouraged her to continue with her excellent compliance

## 2012-11-29 ENCOUNTER — Ambulatory Visit: Payer: Medicaid Other | Admitting: Internal Medicine

## 2012-11-30 ENCOUNTER — Other Ambulatory Visit: Payer: Medicaid Other

## 2012-12-04 ENCOUNTER — Encounter (HOSPITAL_COMMUNITY): Payer: Self-pay | Admitting: Anesthesiology

## 2012-12-04 ENCOUNTER — Inpatient Hospital Stay: Admission: AD | Admit: 2012-12-04 | Payer: Self-pay | Source: Other Acute Inpatient Hospital | Admitting: Surgery

## 2012-12-04 ENCOUNTER — Ambulatory Visit (HOSPITAL_COMMUNITY): Payer: Medicaid Other | Admitting: Anesthesiology

## 2012-12-04 ENCOUNTER — Encounter: Payer: Self-pay | Admitting: *Deleted

## 2012-12-04 ENCOUNTER — Encounter (HOSPITAL_COMMUNITY): Admission: RE | Disposition: A | Discharge status: Home / Self Care | Payer: Self-pay | Source: Ambulatory Visit

## 2012-12-04 ENCOUNTER — Inpatient Hospital Stay (HOSPITAL_COMMUNITY)
Admission: RE | Admit: 2012-12-04 | Discharge: 2012-12-07 | DRG: 742 | Disposition: A | Discharge status: Home / Self Care | Payer: Medicaid Other | Source: Ambulatory Visit | Attending: General Surgery | Admitting: General Surgery

## 2012-12-04 ENCOUNTER — Encounter (INDEPENDENT_AMBULATORY_CARE_PROVIDER_SITE_OTHER): Payer: Self-pay | Admitting: Surgery

## 2012-12-04 DIAGNOSIS — K219 Gastro-esophageal reflux disease without esophagitis: Secondary | ICD-10-CM | POA: Diagnosis present

## 2012-12-04 DIAGNOSIS — R1900 Intra-abdominal and pelvic swelling, mass and lump, unspecified site: Secondary | ICD-10-CM | POA: Diagnosis present

## 2012-12-04 DIAGNOSIS — N83209 Unspecified ovarian cyst, unspecified side: Principal | ICD-10-CM | POA: Diagnosis present

## 2012-12-04 DIAGNOSIS — A6009 Herpesviral infection of other urogenital tract: Secondary | ICD-10-CM | POA: Diagnosis present

## 2012-12-04 DIAGNOSIS — S36509A Unspecified injury of unspecified part of colon, initial encounter: Secondary | ICD-10-CM

## 2012-12-04 DIAGNOSIS — Z21 Asymptomatic human immunodeficiency virus [HIV] infection status: Secondary | ICD-10-CM | POA: Diagnosis present

## 2012-12-04 DIAGNOSIS — N736 Female pelvic peritoneal adhesions (postinfective): Secondary | ICD-10-CM | POA: Diagnosis present

## 2012-12-04 DIAGNOSIS — Z79899 Other long term (current) drug therapy: Secondary | ICD-10-CM

## 2012-12-04 DIAGNOSIS — G709 Myoneural disorder, unspecified: Secondary | ICD-10-CM | POA: Diagnosis present

## 2012-12-04 DIAGNOSIS — E119 Type 2 diabetes mellitus without complications: Secondary | ICD-10-CM | POA: Diagnosis present

## 2012-12-04 DIAGNOSIS — A6000 Herpesviral infection of urogenital system, unspecified: Secondary | ICD-10-CM | POA: Diagnosis present

## 2012-12-04 DIAGNOSIS — N9489 Other specified conditions associated with female genital organs and menstrual cycle: Secondary | ICD-10-CM | POA: Diagnosis present

## 2012-12-04 DIAGNOSIS — F319 Bipolar disorder, unspecified: Secondary | ICD-10-CM | POA: Diagnosis present

## 2012-12-04 DIAGNOSIS — F411 Generalized anxiety disorder: Secondary | ICD-10-CM | POA: Diagnosis present

## 2012-12-04 DIAGNOSIS — IMO0002 Reserved for concepts with insufficient information to code with codable children: Secondary | ICD-10-CM | POA: Diagnosis not present

## 2012-12-04 DIAGNOSIS — J45909 Unspecified asthma, uncomplicated: Secondary | ICD-10-CM | POA: Diagnosis present

## 2012-12-04 HISTORY — DX: Unspecified asthma, uncomplicated: J45.909

## 2012-12-04 HISTORY — PX: LAPAROSCOPIC LYSIS OF ADHESIONS: SHX5905

## 2012-12-04 HISTORY — PX: LAPAROTOMY: SHX154

## 2012-12-04 HISTORY — DX: Herpesviral infection of other urogenital tract: A60.09

## 2012-12-04 HISTORY — DX: Type 2 diabetes mellitus without complications: E11.9

## 2012-12-04 HISTORY — PX: ROBOTIC ASSISTED BILATERAL SALPINGO OOPHERECTOMY: SHX6078

## 2012-12-04 HISTORY — DX: Generalized anxiety disorder: F41.1

## 2012-12-04 HISTORY — DX: Bipolar disorder, unspecified: F31.9

## 2012-12-04 HISTORY — DX: Human immunodeficiency virus (HIV) disease: B20

## 2012-12-04 LAB — CBC
HCT: 35.5 % — ABNORMAL LOW (ref 36.0–46.0)
MCH: 32.6 pg (ref 26.0–34.0)
MCV: 94.2 fL (ref 78.0–100.0)
Platelets: 230 10*3/uL (ref 150–400)
RBC: 3.77 MIL/uL — ABNORMAL LOW (ref 3.87–5.11)
WBC: 18.2 10*3/uL — ABNORMAL HIGH (ref 4.0–10.5)

## 2012-12-04 LAB — CREATININE, SERUM: GFR calc Af Amer: 76 mL/min — ABNORMAL LOW (ref >90)

## 2012-12-04 LAB — GLUCOSE, CAPILLARY: Glucose-Capillary: 118 mg/dL — ABNORMAL HIGH (ref 70–99)

## 2012-12-04 SURGERY — ROBOTIC ASSISTED BILATERAL SALPINGO OOPHORECTOMY
Anesthesia: General | Site: Abdomen | Laterality: Right | Wound class: Clean Contaminated

## 2012-12-04 MED ORDER — DEXAMETHASONE SODIUM PHOSPHATE 10 MG/ML IJ SOLN
INTRAMUSCULAR | Status: DC | PRN
  Administered 2012-12-04: 10 mg via INTRAVENOUS

## 2012-12-04 MED ORDER — ONDANSETRON HCL 4 MG/2ML IJ SOLN
INTRAMUSCULAR | Status: AC
  Filled 2012-12-04: qty 2

## 2012-12-04 MED ORDER — BUPIVACAINE HCL (PF) 0.25 % IJ SOLN
INTRAMUSCULAR | Status: AC
  Filled 2012-12-04: qty 30

## 2012-12-04 MED ORDER — LACTATED RINGERS IR SOLN
Status: DC | PRN
  Administered 2012-12-04: 3000 mL

## 2012-12-04 MED ORDER — MIDAZOLAM HCL 5 MG/5ML IJ SOLN
INTRAMUSCULAR | Status: DC | PRN
  Administered 2012-12-04: 2 mg via INTRAVENOUS

## 2012-12-04 MED ORDER — CEFAZOLIN SODIUM-DEXTROSE 2-3 GM-% IV SOLR
INTRAVENOUS | Status: DC | PRN
  Administered 2012-12-04: 2 g via INTRAVENOUS

## 2012-12-04 MED ORDER — METRONIDAZOLE IN NACL 5-0.79 MG/ML-% IV SOLN
500.0000 mg | Freq: Once | INTRAVENOUS | Status: AC
  Administered 2012-12-04: 500 mg via INTRAVENOUS
  Filled 2012-12-04: qty 100

## 2012-12-04 MED ORDER — HYDROMORPHONE HCL PF 1 MG/ML IJ SOLN
INTRAMUSCULAR | Status: AC
  Administered 2012-12-04: 0.5 mg via INTRAVENOUS
  Filled 2012-12-04: qty 1

## 2012-12-04 MED ORDER — ONDANSETRON HCL 4 MG/2ML IJ SOLN
INTRAMUSCULAR | Status: DC | PRN
  Administered 2012-12-04: 4 mg via INTRAVENOUS

## 2012-12-04 MED ORDER — BUPIVACAINE HCL (PF) 0.25 % IJ SOLN
INTRAMUSCULAR | Status: DC | PRN
  Administered 2012-12-04: 9 mL

## 2012-12-04 MED ORDER — GLYCOPYRROLATE 0.2 MG/ML IJ SOLN
INTRAMUSCULAR | Status: AC
  Filled 2012-12-04: qty 4

## 2012-12-04 MED ORDER — MONTELUKAST SODIUM 10 MG PO TABS
10.0000 mg | ORAL_TABLET | Freq: Once | ORAL | Status: AC
  Administered 2012-12-04: 10 mg via ORAL
  Filled 2012-12-04: qty 1

## 2012-12-04 MED ORDER — HYDROMORPHONE HCL PF 1 MG/ML IJ SOLN
1.0000 mg | INTRAMUSCULAR | Status: DC | PRN
  Administered 2012-12-04 – 2012-12-06 (×10): 1 mg via INTRAVENOUS
  Filled 2012-12-04 (×10): qty 1

## 2012-12-04 MED ORDER — DIPHENHYDRAMINE HCL 50 MG/ML IJ SOLN
25.0000 mg | Freq: Four times a day (QID) | INTRAMUSCULAR | Status: DC | PRN
Start: 2012-12-04 — End: 2012-12-07
  Administered 2012-12-04 – 2012-12-06 (×7): 25 mg via INTRAVENOUS
  Filled 2012-12-04 (×7): qty 1

## 2012-12-04 MED ORDER — ZIPRASIDONE HCL 40 MG PO CAPS
40.0000 mg | ORAL_CAPSULE | Freq: Once | ORAL | Status: DC
  Filled 2012-12-04: qty 1

## 2012-12-04 MED ORDER — OXYCODONE HCL 5 MG PO TABS
5.0000 mg | ORAL_TABLET | ORAL | Status: DC | PRN
  Administered 2012-12-05 – 2012-12-07 (×5): 5 mg via ORAL
  Filled 2012-12-04 (×7): qty 1

## 2012-12-04 MED ORDER — LITHIUM CARBONATE 300 MG PO CAPS
900.0000 mg | ORAL_CAPSULE | Freq: Once | ORAL | Status: AC
  Administered 2012-12-04: 900 mg via ORAL
  Filled 2012-12-04: qty 3

## 2012-12-04 MED ORDER — LIDOCAINE HCL (CARDIAC) 20 MG/ML IV SOLN
INTRAVENOUS | Status: AC
  Filled 2012-12-04: qty 5

## 2012-12-04 MED ORDER — DEXAMETHASONE SODIUM PHOSPHATE 10 MG/ML IJ SOLN
INTRAMUSCULAR | Status: AC
  Filled 2012-12-04: qty 1

## 2012-12-04 MED ORDER — DOLUTEGRAVIR SODIUM 50 MG PO TABS
50.0000 mg | ORAL_TABLET | Freq: Every day | ORAL | Status: DC
  Administered 2012-12-05 – 2012-12-06 (×3): 50 mg via ORAL
  Filled 2012-12-04 (×4): qty 1

## 2012-12-04 MED ORDER — ARTIFICIAL TEARS OP OINT
TOPICAL_OINTMENT | OPHTHALMIC | Status: DC | PRN
  Administered 2012-12-04: 1 via OPHTHALMIC

## 2012-12-04 MED ORDER — NEOSTIGMINE METHYLSULFATE 1 MG/ML IJ SOLN
INTRAMUSCULAR | Status: AC
  Filled 2012-12-04: qty 1

## 2012-12-04 MED ORDER — NEOSTIGMINE METHYLSULFATE 1 MG/ML IJ SOLN
INTRAMUSCULAR | Status: DC | PRN
  Administered 2012-12-04: 4 mg via INTRAVENOUS

## 2012-12-04 MED ORDER — GLYCOPYRROLATE 0.2 MG/ML IJ SOLN
INTRAMUSCULAR | Status: DC | PRN
  Administered 2012-12-04: 0.1 mg via INTRAVENOUS
  Administered 2012-12-04: .8 mg via INTRAVENOUS

## 2012-12-04 MED ORDER — HYDROMORPHONE HCL PF 1 MG/ML IJ SOLN
INTRAMUSCULAR | Status: AC
  Filled 2012-12-04: qty 1

## 2012-12-04 MED ORDER — FENTANYL CITRATE 0.05 MG/ML IJ SOLN
INTRAMUSCULAR | Status: AC
  Filled 2012-12-04: qty 2

## 2012-12-04 MED ORDER — ROCURONIUM BROMIDE 50 MG/5ML IV SOLN
INTRAVENOUS | Status: AC
  Filled 2012-12-04: qty 2

## 2012-12-04 MED ORDER — EMTRICITABINE-TENOFOVIR DF 200-300 MG PO TABS
1.0000 | ORAL_TABLET | Freq: Every day | ORAL | Status: DC
  Administered 2012-12-05 – 2012-12-06 (×3): 1 via ORAL
  Filled 2012-12-04 (×4): qty 1

## 2012-12-04 MED ORDER — ONDANSETRON HCL 4 MG/2ML IJ SOLN: 4.0000 mg | Freq: Four times a day (QID) | INTRAMUSCULAR | Status: DC | PRN

## 2012-12-04 MED ORDER — HYDROMORPHONE HCL PF 1 MG/ML IJ SOLN
INTRAMUSCULAR | Status: DC | PRN
  Administered 2012-12-04 (×2): 0.5 mg via INTRAVENOUS
  Administered 2012-12-04: 1 mg via INTRAVENOUS

## 2012-12-04 MED ORDER — LACTATED RINGERS IV SOLN
INTRAVENOUS | Status: DC
  Administered 2012-12-04 (×3): via INTRAVENOUS

## 2012-12-04 MED ORDER — ENOXAPARIN SODIUM 40 MG/0.4ML ~~LOC~~ SOLN
40.0000 mg | SUBCUTANEOUS | Status: DC
  Administered 2012-12-05 – 2012-12-06 (×2): 40 mg via SUBCUTANEOUS
  Filled 2012-12-04 (×3): qty 0.4

## 2012-12-04 MED ORDER — AMITRIPTYLINE HCL 25 MG PO TABS
25.0000 mg | ORAL_TABLET | Freq: Once | ORAL | Status: DC
  Filled 2012-12-04: qty 1

## 2012-12-04 MED ORDER — HYDROMORPHONE HCL PF 1 MG/ML IJ SOLN
0.2500 mg | INTRAMUSCULAR | Status: DC | PRN
  Administered 2012-12-04: 0.5 mg via INTRAVENOUS

## 2012-12-04 MED ORDER — PROPOFOL 10 MG/ML IV EMUL
INTRAVENOUS | Status: AC
  Filled 2012-12-04: qty 20

## 2012-12-04 MED ORDER — DARUNAVIR ETHANOLATE 800 MG PO TABS
800.0000 mg | ORAL_TABLET | Freq: Every day | ORAL | Status: DC
  Administered 2012-12-05 – 2012-12-06 (×3): 800 mg via ORAL
  Filled 2012-12-04 (×4): qty 1

## 2012-12-04 MED ORDER — ROCURONIUM BROMIDE 100 MG/10ML IV SOLN
INTRAVENOUS | Status: DC | PRN
  Administered 2012-12-04 (×2): 10 mg via INTRAVENOUS
  Administered 2012-12-04: 45 mg via INTRAVENOUS
  Administered 2012-12-04: 5 mg via INTRAVENOUS

## 2012-12-04 MED ORDER — PROPOFOL 10 MG/ML IV EMUL
INTRAVENOUS | Status: DC | PRN
  Administered 2012-12-04: 150 mg via INTRAVENOUS

## 2012-12-04 MED ORDER — MIDAZOLAM HCL 2 MG/2ML IJ SOLN
INTRAMUSCULAR | Status: AC
  Filled 2012-12-04: qty 2

## 2012-12-04 MED ORDER — GLYCOPYRROLATE 0.2 MG/ML IJ SOLN
INTRAMUSCULAR | Status: AC
  Filled 2012-12-04: qty 1

## 2012-12-04 MED ORDER — FENTANYL CITRATE 0.05 MG/ML IJ SOLN
INTRAMUSCULAR | Status: DC | PRN
  Administered 2012-12-04 (×3): 50 ug via INTRAVENOUS
  Administered 2012-12-04 (×2): 100 ug via INTRAVENOUS

## 2012-12-04 MED ORDER — LIDOCAINE HCL (CARDIAC) 20 MG/ML IV SOLN
INTRAVENOUS | Status: DC | PRN
  Administered 2012-12-04: 60 mg via INTRAVENOUS

## 2012-12-04 MED ORDER — KCL IN DEXTROSE-NACL 20-5-0.9 MEQ/L-%-% IV SOLN
INTRAVENOUS | Status: DC
  Administered 2012-12-05 – 2012-12-07 (×4): via INTRAVENOUS
  Filled 2012-12-04 (×9): qty 1000

## 2012-12-04 MED ORDER — FENTANYL CITRATE 0.05 MG/ML IJ SOLN
INTRAMUSCULAR | Status: AC
  Filled 2012-12-04: qty 5

## 2012-12-04 MED ORDER — RITONAVIR 100 MG PO TABS
100.0000 mg | ORAL_TABLET | Freq: Every day | ORAL | Status: DC
  Administered 2012-12-05 – 2012-12-06 (×3): 100 mg via ORAL
  Filled 2012-12-04 (×4): qty 1

## 2012-12-04 SURGICAL SUPPLY — 112 items
APL SKNCLS STERI-STRIP NONHPOA (GAUZE/BANDAGES/DRESSINGS)
BAG SPEC RTRVL LRG 6X4 10 (ENDOMECHANICALS) ×2
BAG URINE DRAINAGE (UROLOGICAL SUPPLIES) ×3 IMPLANT
BARRIER ADHS 3X4 INTERCEED (GAUZE/BANDAGES/DRESSINGS) IMPLANT
BENZOIN TINCTURE PRP APPL 2/3 (GAUZE/BANDAGES/DRESSINGS) IMPLANT
BRR ADH 4X3 ABS CNTRL BYND (GAUZE/BANDAGES/DRESSINGS)
BRR ADH 6X5 SEPRAFILM 1 SHT (MISCELLANEOUS)
CANISTER SUCTION 2500CC (MISCELLANEOUS) ×3 IMPLANT
CATH FOLEY 3WAY  5CC 16FR (CATHETERS) ×1
CATH FOLEY 3WAY 5CC 16FR (CATHETERS) ×2 IMPLANT
CELLS DAT CNTRL 66122 CELL SVR (MISCELLANEOUS) IMPLANT
CHLORAPREP W/TINT 26ML (MISCELLANEOUS) ×3 IMPLANT
CLOTH BEACON ORANGE TIMEOUT ST (SAFETY) ×3 IMPLANT
CONT PATH 16OZ SNAP LID 3702 (MISCELLANEOUS) ×3 IMPLANT
CONTAINER PREFILL 10% NBF 60ML (FORM) IMPLANT
CORDS BIPOLAR (ELECTRODE) ×3 IMPLANT
COVER MAYO STAND STRL (DRAPES) ×3 IMPLANT
COVER TABLE BACK 60X90 (DRAPES) ×6 IMPLANT
COVER TIP SHEARS 8 DVNC (MISCELLANEOUS) ×2 IMPLANT
COVER TIP SHEARS 8MM DA VINCI (MISCELLANEOUS) ×1
DECANTER SPIKE VIAL GLASS SM (MISCELLANEOUS) ×3 IMPLANT
DRAIN JACKSON PRT FLT 10 (DRAIN) ×3 IMPLANT
DRAPE HUG U DISPOSABLE (DRAPE) ×3 IMPLANT
DRAPE LG THREE QUARTER DISP (DRAPES) ×6 IMPLANT
DRAPE UNDERBUTTOCKS STRL (DRAPE) IMPLANT
DRAPE WARM FLUID 44X44 (DRAPE) ×3 IMPLANT
DRESSING TELFA 8X3 (GAUZE/BANDAGES/DRESSINGS) ×3 IMPLANT
ELECT REM PT RETURN 9FT ADLT (ELECTROSURGICAL) ×3
ELECTRODE REM PT RTRN 9FT ADLT (ELECTROSURGICAL) ×2 IMPLANT
EVACUATOR SILICONE 100CC (DRAIN) ×3 IMPLANT
EVACUATOR SMOKE 8.L (FILTER) ×3 IMPLANT
GAUZE SPONGE 4X4 12PLY STRL LF (GAUZE/BANDAGES/DRESSINGS) ×3 IMPLANT
GAUZE SPONGE 4X4 16PLY XRAY LF (GAUZE/BANDAGES/DRESSINGS) IMPLANT
GAUZE VASELINE 3X9 (GAUZE/BANDAGES/DRESSINGS) IMPLANT
GLOVE BIO SURGEON STRL SZ 6.5 (GLOVE) IMPLANT
GLOVE BIO SURGEON STRL SZ8 (GLOVE) ×6 IMPLANT
GLOVE BIOGEL PI IND STRL 7.0 (GLOVE) ×4 IMPLANT
GLOVE BIOGEL PI INDICATOR 7.0 (GLOVE) ×2
GLOVE ECLIPSE 6.5 STRL STRAW (GLOVE) IMPLANT
GOWN PREVENTION PLUS LG XLONG (DISPOSABLE) ×9 IMPLANT
GOWN STRL REIN XL XLG (GOWN DISPOSABLE) ×18 IMPLANT
IV LACTATED RINGERS 1000ML (IV SOLUTION) IMPLANT
KIT ACCESSORY DA VINCI DISP (KITS) ×1
KIT ACCESSORY DVNC DISP (KITS) ×2 IMPLANT
LEGGING LITHOTOMY PAIR STRL (DRAPES) ×3 IMPLANT
MANIPULATOR UTERINE 4.5 ZUMI (MISCELLANEOUS) ×3 IMPLANT
NEEDLE HYPO 25X1 1.5 SAFETY (NEEDLE) IMPLANT
NEEDLE INSUFFLATION 120MM (ENDOMECHANICALS) IMPLANT
NEEDLE INSUFFLATION 150MM (ENDOMECHANICALS) ×3 IMPLANT
NS IRRIG 1000ML POUR BTL (IV SOLUTION) IMPLANT
OCCLUDER COLPOPNEUMO (BALLOONS) ×3 IMPLANT
PACK ABDOMINAL GYN (CUSTOM PROCEDURE TRAY) IMPLANT
PACK LAVH (CUSTOM PROCEDURE TRAY) ×3 IMPLANT
PAD ABD 7.5X8 STRL (GAUZE/BANDAGES/DRESSINGS) ×3 IMPLANT
PAD MAGNETIC INST (MISCELLANEOUS) IMPLANT
PAD OB MATERNITY 4.3X12.25 (PERSONAL CARE ITEMS) ×3 IMPLANT
PAD PREP 24X48 CUFFED NSTRL (MISCELLANEOUS) ×6 IMPLANT
PLUG CATH AND CAP STER (CATHETERS) ×3 IMPLANT
POUCH SPECIMEN RETRIEVAL 10MM (ENDOMECHANICALS) ×3 IMPLANT
PROTECTOR NERVE ULNAR (MISCELLANEOUS) ×6 IMPLANT
RETRACTOR WND ALEXIS 25 LRG (MISCELLANEOUS) IMPLANT
RTRCTR WOUND ALEXIS 18CM MED (MISCELLANEOUS)
RTRCTR WOUND ALEXIS 25CM LRG (MISCELLANEOUS)
SCISSORS LAP 5X35 DISP (ENDOMECHANICALS) ×3 IMPLANT
SCRUB PCMX 4 OZ (MISCELLANEOUS) IMPLANT
SEPRAFILM MEMBRANE 5X6 (MISCELLANEOUS) IMPLANT
SET CYSTO W/LG BORE CLAMP LF (SET/KITS/TRAYS/PACK) IMPLANT
SET IRRIG TUBING LAPAROSCOPIC (IRRIGATION / IRRIGATOR) ×3 IMPLANT
SHEET LAVH (DRAPES) IMPLANT
SOLUTION ELECTROLUBE (MISCELLANEOUS) ×3 IMPLANT
SPONGE LAP 18X18 X RAY DECT (DISPOSABLE) ×6 IMPLANT
STAPLER VISISTAT 35W (STAPLE) ×3 IMPLANT
STRIP CLOSURE SKIN 1/4X4 (GAUZE/BANDAGES/DRESSINGS) IMPLANT
SUT ETHILON 3 0 PS 1 (SUTURE) ×3 IMPLANT
SUT MNCRL AB 3-0 PS2 27 (SUTURE) ×6 IMPLANT
SUT MON AB 2-0 CT1 27 (SUTURE) IMPLANT
SUT MON AB 3-0 SH 27 (SUTURE)
SUT MON AB 3-0 SH27 (SUTURE) IMPLANT
SUT PDS AB 0 CT 36 (SUTURE) IMPLANT
SUT PDS AB 0 CTX 60 (SUTURE) IMPLANT
SUT PDS AB 1 CT1 36 (SUTURE) ×6 IMPLANT
SUT VIC AB 0 CT1 27 (SUTURE)
SUT VIC AB 0 CT1 27XBRD ANTBC (SUTURE) IMPLANT
SUT VIC AB 1 CT1 27 (SUTURE)
SUT VIC AB 1 CT1 27XBRD ANTBC (SUTURE) IMPLANT
SUT VIC AB 2-0 CT1 27 (SUTURE)
SUT VIC AB 2-0 CT1 TAPERPNT 27 (SUTURE) IMPLANT
SUT VIC AB 3-0 SH 18 (SUTURE) ×6 IMPLANT
SUT VICRYL 0 27 CT2 27 ABS (SUTURE) ×3 IMPLANT
SUT VICRYL 0 UR6 27IN ABS (SUTURE) ×3 IMPLANT
SUT VICRYL 1 TIES 12X18 (SUTURE) IMPLANT
SYR 50ML LL SCALE MARK (SYRINGE) ×3 IMPLANT
SYR CONTROL 10ML LL (SYRINGE) IMPLANT
SYSTEM CONVERTIBLE TROCAR (TROCAR) IMPLANT
TAPE CLOTH SURG 4X10 WHT LF (GAUZE/BANDAGES/DRESSINGS) ×3 IMPLANT
TIP RUMI ORANGE 6.7MMX12CM (TIP) IMPLANT
TIP UTERINE 5.1X6CM LAV DISP (MISCELLANEOUS) IMPLANT
TIP UTERINE 6.7X10CM GRN DISP (MISCELLANEOUS) IMPLANT
TIP UTERINE 6.7X6CM WHT DISP (MISCELLANEOUS) IMPLANT
TIP UTERINE 6.7X8CM BLUE DISP (MISCELLANEOUS) IMPLANT
TOWEL OR 17X24 6PK STRL BLUE (TOWEL DISPOSABLE) ×9 IMPLANT
TRAY FOLEY CATH 14FR (SET/KITS/TRAYS/PACK) IMPLANT
TROCAR 12M 150ML BLUNT (TROCAR) ×3 IMPLANT
TROCAR DILATING TIP 12MM 150MM (ENDOMECHANICALS) ×3 IMPLANT
TROCAR DISP BLADELESS 8 DVNC (TROCAR) ×2 IMPLANT
TROCAR DISP BLADELESS 8MM (TROCAR) ×1
TROCAR XCEL 12X100 BLDLESS (ENDOMECHANICALS) IMPLANT
TROCAR XCEL DIL TIP R 11M (ENDOMECHANICALS) ×3 IMPLANT
TROCAR XCEL NON-BLD 5MMX100MML (ENDOMECHANICALS) ×3 IMPLANT
TUBING FILTER THERMOFLATOR (ELECTROSURGICAL) ×3 IMPLANT
WARMER LAPAROSCOPE (MISCELLANEOUS) ×3 IMPLANT
WATER STERILE IRR 1000ML POUR (IV SOLUTION) ×9 IMPLANT

## 2012-12-04 NOTE — Anesthesia Preprocedure Evaluation (Addendum)
Anesthesia Evaluation  Patient identified by MRN, date of birth, ID band Patient awake    Reviewed: Allergy & Precautions, H&P , Patient's Chart, lab work & pertinent test results, reviewed documented beta blocker date and time   Airway Mallampati: II TM Distance: >3 FB Neck ROM: full    Dental no notable dental hx.    Pulmonary asthma ,  breath sounds clear to auscultation  Pulmonary exam normal       Cardiovascular Rhythm:regular Rate:Normal     Neuro/Psych    GI/Hepatic GERD-  ,  Endo/Other  diabetes, Type 2  Renal/GU      Musculoskeletal   Abdominal   Peds  Hematology   Anesthesia Other Findings No meds for DM Asthma ;  Breathing well, rare inhaler use.   Reproductive/Obstetrics                          Anesthesia Physical Anesthesia Plan  ASA: III  Anesthesia Plan: General   Post-op Pain Management:    Induction: Intravenous  Airway Management Planned: Oral ETT  Additional Equipment:   Intra-op Plan:   Post-operative Plan:   Informed Consent: I have reviewed the patients History and Physical, chart, labs and discussed the procedure including the risks, benefits and alternatives for the proposed anesthesia with the patient or authorized representative who has indicated his/her understanding and acceptance.   Dental Advisory Given and Dental advisory given  Plan Discussed with: CRNA and Surgeon  Anesthesia Plan Comments: (  Discussed  general anesthesia, including possible nausea, instrumentation of airway, sore throat,pulmonary aspiration, etc. I asked if the were any outstanding questions, or  concerns before we proceeded. )       Anesthesia Quick Evaluation

## 2012-12-04 NOTE — Progress Notes (Signed)
Pt requesting to take home medication tonight.  Pt currently NPO per order by Dr. Luisa Hart.  Pt also requesting Benadryl with Dilaudid to minimize itching side effect.  Phoned Dr. Tamela Oddi.  See order for Benadryl.  Dr. Tamela Oddi asked RN to phone Dr. Luisa Hart regarding home medication.  Spoke to Dr. Biagio Quint, on call for Dr. Luisa Hart.  He stated that patient could have home evening medication with a sip of water, but that he was unable to enter the orders.  Phoned Mindy Holcombe, DPh in pharmacy to have home medications added to [facility].

## 2012-12-04 NOTE — Consult Note (Signed)
Intraoperative consultation note  [facility] Tamela Oddi intraoperatively. She was performing a robotic removal of ovary when she noticed a serosal injury to the distal sigmoid colon and asked that I scrub in to evaluate. I reviewed the images a laparoscopic procedure and felt that open exploration with repair was necessary.  Preoperative diagnosis: Serosal injury distal sigmoid colon  Postoperative diagnosis: Same  Procedure: Exploratory laparotomy repair of serosal injury distal sigmoid colon  Surgeon: Harriette Bouillon M.D.  Assistant: Dr. Tamela Oddi  EBL: Minimal  Specimen: None  Indications for procedure: The patient presented for removal of ovary. Cautery injury to distal sigmoid colon noted. I was called to evaluate and felt that exploration necessary to repair this injury. This appeared to be a serosal injury only.  Description of procedure: The patient was prepped and draped to have completed a robotic portion. A lower midline incision was used. Midline was found dissection was carried through the midline into the abdominal cavity without difficulty. Retractors were placed in the injury could be seen in the distal sigmoid colon and was partial thickness in the patient's right lateral wall of the distal sigmoid colon. This was a cautery injury and was partial thickness. I debrided the colon tissue back to healthy serosa. The mucosa was intact. The edges bled readily. I then repaired to the 8mm defect with interrupted 3 o Vicryl sutures in 2 layers. I submerged injury in sterile saline and compressed the bowel wall insult no evidence of air leak. Pelvis is irrigated. Hemostasis achieved. A 19 flat drain was placed in the pelvis. The fascia was closed with #1 PDS suture. Subcutaneous fat irrigated. Skin closed with staples. All counts counts are found to be correct sponge, needles and entrance. Dr. Tamela Oddi completed the skin closure for her portion  of the procedure. The patient was taken to PACU in stable condition.

## 2012-12-04 NOTE — Transfer of Care (Signed)
Immediate Anesthesia Transfer of Care Note  Patient: [name]  Procedure(s) Performed: Procedure(s) with comments: ROBOTIC ASSISTED Right  SALPINGO OOPHORECTOMY (Right) EXPLORATORY LAPAROTOMY (N/A) - repair of serosal injury LAPAROSCOPIC LYSIS OF ADHESIONS (N/A)  Patient Location: PACU  Anesthesia Type:General  Level of Consciousness: awake, alert  and oriented  Airway & Oxygen Therapy: Patient Spontanous Breathing and Patient connected to nasal cannula oxygen  Post-op Assessment: Report given to PACU RN and Post -op Vital signs reviewed and stable  Post vital signs: Reviewed and stable  Complications: No apparent anesthesia complications

## 2012-12-04 NOTE — Anesthesia Procedure Notes (Signed)
Procedure Name: Intubation Date/Time: 12/04/2012 1:05 PM Performed by: Graciela Husbands Pre-anesthesia Checklist: Suction available, Emergency Drugs available, Timeout performed, Patient identified and Patient being monitored Patient Re-evaluated:Patient Re-evaluated prior to inductionOxygen Delivery Method: Circle system utilized Preoxygenation: Pre-oxygenation with 100% oxygen Intubation Type: IV induction Ventilation: Mask ventilation without difficulty and Oral airway inserted - appropriate to patient size Laryngoscope Size: Mac and 3 Grade View: Grade I Tube type: Oral Tube size: 7.0 mm Number of attempts: 1 Airway Equipment and Method: Patient positioned with wedge pillow and Stylet Placement Confirmation: ETT inserted through vocal cords under direct vision,  positive ETCO2 and breath sounds checked- equal and bilateral Secured at: 21 cm Tube secured with: Tape Dental Injury: Teeth and Oropharynx as per pre-operative assessment

## 2012-12-04 NOTE — Progress Notes (Signed)
Pt picked up to [facility] at 2147 and transferred to [facility] [id number].  Report called to Dorris Fetch, RN.

## 2012-12-04 NOTE — H&P (Signed)
  Chief Complaint: 32 y.o. who presents with a right adnexal mass  Details of Present Illness: The patient has a h/o a prior laparoscopic ovarian cystectomy for a BCT.  She presented with pelvic pain.  An U/S showed a right adnexal mass with radiologic features suggestive of a BCT--3 cm in diameter.  There were no vitals taken for this visit.  Past Medical History  Diagnosis Date  . HIV infection   . Anxiety   . Depression   . Asthma   . Diabetes mellitus without complication     no meds currently- controlled with diet  . GERD (gastroesophageal reflux disease)   . Neuromuscular disorder     carpal tunnel in both arms   History   Social History  . Marital Status: Single    Spouse Name: N/A    Number of Children: N/A  . Years of Education: N/A   Occupational History  . Not on file.   Social History Main Topics  . Smoking status: Never Smoker   . Smokeless tobacco: Never Used  . Alcohol Use: No  . Drug Use: No  . Sexually Active: Not Currently -- Female partner(s)    Birth Control/ Protection: Injection     Comment: pt. declined condoms   Other Topics Concern  . Not on file   Social History Narrative  . No narrative on file   No family history on file.  Pertinent items are noted in HPI.  Pre-Op Diagnosis: Right  adnexal mass 16109   Planned Procedure: Procedure(s): ROBOTIC ASSISTED Right  SALPINGO OOPHORECTOMY   I have reviewed the patient's history and have completed the physical exam and [name] is acceptable for surgery.  Roseanna Rainbow, MD 12/04/2012 10:03 AM

## 2012-12-04 NOTE — Anesthesia Postprocedure Evaluation (Signed)
  Anesthesia Post-op Note  Patient: [name]  Procedure(s) Performed: Procedure(s) with comments: ROBOTIC ASSISTED Right  SALPINGO OOPHORECTOMY (Right) EXPLORATORY LAPAROTOMY (N/A) - repair of serosal injury LAPAROSCOPIC LYSIS OF ADHESIONS (N/A) Patient is awake and responsive. Pain and nausea are reasonably well controlled. Vital signs are stable and clinically acceptable. Oxygen saturation is clinically acceptable. There are no apparent anesthetic complications at this time. Patient is ready for discharge.

## 2012-12-04 NOTE — Op Note (Addendum)
Pre-operative Diagnosis: Adnexal mass and pelvic pain  Post-operative Diagnosis: Adnexal mass, pelvic pain and pelvic adhesions, omental adhesions, serosal injury to distal sigmoid colon  Operation: Robotic-assisted right salpingo-oophorectomy, lysis of adhesions, exploratory laparotomy, repair of serosal injury to distal sigmoid colon  Surgeon: Lorayne Marek  Assistant: Coral Ceo, MD  Anesthesia: GET  Urine Output: per Anesthesthesiology  Findings: Omental adhesions to the parietal peritoneum of the anterior abdominal wall.  Right adnexal adhesions to the peritoneum of the anterior cul-de-sac, broad ligament.  There was a cystic lesion involving the distal aspect of the ovary.    There were filmy adhesions in the posterior cul-de-sac.  Estimated Blood Loss:  Minimal                 Total IV Fluids: per Anesthesiolgy         Specimens: PATHOLOGY               Complications:  Serosal injury to the distal sigmoid colon         Disposition: PACU - hemodynamically stable.         Condition: Stable    Procedure Details  The patient was seen in the Holding Room. The risks, benefits, complications, treatment options, and expected outcomes were discussed with the patient.  The patient concurred with the proposed plan, giving informed consent.  The site of surgery properly noted/marked. The patient was identified as [name] and the procedure verified as a Robotic-assisted hysterectomy with bilateral salpingectomy. A Time Out was held and the above information confirmed.  After induction of anesthesia, the patient was draped and prepped in the usual sterile manner. Pt was placed in supine position after anesthesia and draped and prepped in the usual sterile manner. The abdominal drape was placed after the CholoraPrep had been allowed to dry for 3 minutes.  Her arms were tucked to her side with all appropriate precautions.    The patient was placed in  the semi-lithotomy position in [place] stirrups.  The perineum was prepped with Betadine.  Foley catheter was placed.  A sterile speculum was placed in the vagina.  The cervix was grasped with a single-tooth tenaculum and dilated with Shawnie Pons dilators.  The ZUMI uterine manipulator w was placed without difficulty.   OG tube placement was confirmed and to suction.  Approximately 2 cm below the costal margin, in the midclavicular line the skin was anesthestized with 0.25% Marcaine.  A 5 mm incision was made and using a 5 mm Optiview, a 5 mm trocar was placed under direct vision.  The patient's abdomen was insufflated with CO2 gas.  At this point and all points during the procedure, the patient's intra-abdominal pressure did not exceed 15 mmHg.  The left-sided 8 mm port was placed and the above noted omental adhesions divided with monopolar shears.  A 10-12 camera port was place 24 cm above the pubic symphysis.  The right-sided 8 mm port was placed 10 cm lateral and 15 degrees inferior.  All ports were placed under direct visualization.  The 5 mm port was removed.  The incision was extended to accommodate a 10 mm trocar.  A 10 mm trocar and sleeve were advanced under direct visualization.   The robot was docked in the usual fashion.  The distal aspect of the right ovary was mobilized--adhesions were divided involving the anterior cul-de-sac and broad ligament.    The anterior and posterior leaves of the broad ligament were opened.  The  ureter was identified.  A window was made between the infundibulopelvic ligament and the ureter.  At this point a laceration was made with cautery in the serosa of the distal sigmoid colon.  The right infundibulo-pelvic ligament was was coagulated with bipolar cautery and transected.   The adnexa was placed intact in an endocatch bag and removed at laparotomy.    The uterine fundus had been perforated with the manipulator.  The tear was over sewn with a figure-of-eight suture of 0-Vicryl.    At this time, an intraoperative consult was obtained from General Surgery and the decision was made to proceed with a laparotomy and repair of the serosal injury.  The robot was undocked.  A deep, subcutaneous, figure-of-eight 0-Vicryl suture on a UR-6 needle was placed in the 10-12 mm supraumbilical  incision.  All skin incisions were closed in a subcuticular fashion using 3-0 Monocryl.  A skin adhesive was applied.

## 2012-12-05 ENCOUNTER — Encounter (HOSPITAL_COMMUNITY): Payer: Self-pay | Admitting: Obstetrics & Gynecology

## 2012-12-05 MED ORDER — AMITRIPTYLINE HCL 25 MG PO TABS
25.0000 mg | ORAL_TABLET | Freq: Every day | ORAL | Status: DC
  Administered 2012-12-06: 25 mg via ORAL
  Filled 2012-12-05 (×3): qty 1

## 2012-12-05 MED ORDER — VALACYCLOVIR HCL 500 MG PO TABS
500.0000 mg | ORAL_TABLET | Freq: Every day | ORAL | Status: DC
  Administered 2012-12-05 – 2012-12-07 (×3): 500 mg via ORAL
  Filled 2012-12-05 (×3): qty 1

## 2012-12-05 MED ORDER — FLUTICASONE PROPIONATE HFA 44 MCG/ACT IN AERO
1.0000 | INHALATION_SPRAY | Freq: Two times a day (BID) | RESPIRATORY_TRACT | Status: DC
  Administered 2012-12-05 – 2012-12-07 (×3): 1 via RESPIRATORY_TRACT
  Filled 2012-12-05: qty 10.6

## 2012-12-05 MED ORDER — DULOXETINE HCL 30 MG PO CPEP
30.0000 mg | ORAL_CAPSULE | Freq: Every day | ORAL | Status: DC
  Administered 2012-12-05 – 2012-12-07 (×3): 30 mg via ORAL
  Filled 2012-12-05 (×3): qty 1

## 2012-12-05 MED ORDER — LITHIUM CARBONATE 300 MG PO CAPS
450.0000 mg | ORAL_CAPSULE | Freq: Every day | ORAL | Status: DC
  Administered 2012-12-05 – 2012-12-07 (×3): 450 mg via ORAL
  Filled 2012-12-05 (×4): qty 1

## 2012-12-05 MED ORDER — ZIPRASIDONE HCL 40 MG PO CAPS
40.0000 mg | ORAL_CAPSULE | Freq: Every day | ORAL | Status: DC
  Administered 2012-12-05 – 2012-12-06 (×2): 40 mg via ORAL
  Filled 2012-12-05 (×3): qty 1

## 2012-12-05 MED ORDER — ALBUTEROL SULFATE HFA 108 (90 BASE) MCG/ACT IN AERS
2.0000 | INHALATION_SPRAY | Freq: Four times a day (QID) | RESPIRATORY_TRACT | Status: DC | PRN
  Filled 2012-12-05: qty 6.7

## 2012-12-05 MED ORDER — LITHIUM CARBONATE 300 MG PO CAPS
900.0000 mg | ORAL_CAPSULE | Freq: Every day | ORAL | Status: DC
  Administered 2012-12-05 – 2012-12-06 (×2): 900 mg via ORAL
  Filled 2012-12-05 (×3): qty 3

## 2012-12-05 MED ORDER — LORATADINE 10 MG PO TABS
10.0000 mg | ORAL_TABLET | Freq: Two times a day (BID) | ORAL | Status: DC
  Administered 2012-12-05 – 2012-12-07 (×5): 10 mg via ORAL
  Filled 2012-12-05 (×6): qty 1

## 2012-12-05 MED ORDER — FLUTICASONE PROPIONATE 50 MCG/ACT NA SUSP
2.0000 | Freq: Every day | NASAL | Status: DC
  Administered 2012-12-06 – 2012-12-07 (×2): 2 via NASAL
  Filled 2012-12-05: qty 16

## 2012-12-05 NOTE — Progress Notes (Signed)
Ok to try some clears and monitor how she  Does.

## 2012-12-05 NOTE — Progress Notes (Signed)
1 Day Post-Op  Subjective: She would like some more to drink since she is so dry and concerned about her meds..    Objective: Vital signs in last 24 hours: Temp:  [98.3 F (36.8 C)-99.1 F (37.3 C)] 98.6 F (37 C) (04/22 0600) Pulse Rate:  [83-101] 91 (04/22 0600) Resp:  [15-49] 19 (04/22 0600) BP: (114-142)/(63-85) 142/80 mmHg (04/22 0600) SpO2:  [95 %-100 %] 95 % (04/22 0600) Weight:  [86.183 kg (190 lb)] 86.183 kg (190 lb) (04/21 2233) Last BM Date: 12/04/12 67 ml from the drain, NPO except ice chips and sips with meds. Afebrile, VSS WBC is up Intake/Output from previous day: 04/21 0701 - 04/22 0700 In: 2675 [I.V.:2675] Out: 3542.5 [Urine:3350; Drains:67.5; Blood:75] Intake/Output this shift:    General appearance: alert, cooperative and no distress Resp: clear to auscultation bilaterally GI: soft, few BS, some flatus, dressing intact and dry.  drain is clear serous drainage.  Lab Results:   Recent Labs  12/04/12 1606  WBC 18.2*  HGB 12.3  HCT 35.5*  PLT 230    BMET  Recent Labs  12/04/12 1606  CREATININE 1.10   PT/INR No results found for this basename: LABPROT, INR,  in the last 72 hours  No results found for this basename: AST, ALT, ALKPHOS, BILITOT, PROT, ALBUMIN,  in the last 168 hours   Lipase  No results found for this basename: lipase     Studies/Results: No results found.  Medications: . amitriptyline  25 mg Oral Once  . darunavir  800 mg Oral QHS  . Dolutegravir Sodium  50 mg Oral QHS  . emtricitabine-tenofovir  1 tablet Oral QHS  . enoxaparin (LOVENOX) injection  40 mg Subcutaneous Q24H  . ritonavir  100 mg Oral QHS  . ziprasidone  40 mg Oral Once   Prior to Admission medications   Medication Sig Start Date End Date Taking? Authorizing Provider  Darunavir Ethanolate (PREZISTA) 800 MG tablet Take 1 tablet (800 mg total) by mouth daily. 11/13/12  Yes Randall Hiss, MD  Dolutegravir Sodium (TIVICAY) 50 MG TABS Take 1 tablet (50  mg total) by mouth daily. 11/27/12  Yes Gardiner Barefoot, MD  DULoxetine (CYMBALTA) 30 MG capsule Take 30 mg by mouth daily.   Yes Historical Provider, MD  emtricitabine-tenofovir (TRUVADA) 200-300 MG per tablet Take 1 tablet by mouth daily.   Yes Historical Provider, MD  lithium carbonate 150 MG capsule Take 450 mg by mouth daily with breakfast.   Yes Historical Provider, MD  lithium carbonate 300 MG capsule Take 900 mg by mouth at bedtime.   Yes Historical Provider, MD  montelukast (SINGULAIR) 10 MG tablet Take 10 mg by mouth at bedtime.   Yes Historical Provider, MD  ritonavir (NORVIR) 100 MG capsule Take 100 mg by mouth daily with breakfast.   Yes Historical Provider, MD  valACYclovir (VALTREX) 500 MG tablet Take 1 tablet (500 mg total) by mouth daily. 08/02/12  Yes Gardiner Barefoot, MD  Vitamin D, Ergocalciferol, (DRISDOL) 50000 UNITS CAPS Take 50,000 Units by mouth every 7 (seven) days.   Yes Historical Provider, MD  ziprasidone (GEODON) 40 MG capsule Take 40 mg by mouth at bedtime.    Yes Historical Provider, MD  albuterol (PROVENTIL HFA;VENTOLIN HFA) 108 (90 BASE) MCG/ACT inhaler Inhale 2 puffs into the lungs every 6 (six) hours as needed for wheezing.    Historical Provider, MD  amitriptyline (ELAVIL) 25 MG tablet Take 25 mg by mouth at bedtime.  Historical Provider, MD  beclomethasone (QVAR) 40 MCG/ACT inhaler Inhale 2 puffs into the lungs 2 (two) times daily. 11/27/12   Gardiner Barefoot, MD  flunisolide (NASAREL) 29 MCG/ACT (0.025%) nasal spray Place 2 sprays into the nose at bedtime. Dose is for each nostril. 11/13/12   Randall Hiss, MD  HYDROcodone-acetaminophen (LORTAB) 7.5-500 MG per tablet Take 1 tablet by mouth every 6 (six) hours as needed for pain.    Historical Provider, MD  Prenatal Vit-Fe Fumarate-FA (MULTIVITAMIN-PRENATAL) 27-0.8 MG TABS Take 2 tablets by mouth daily at 12 noon. Uses Gummies.    Historical Provider, MD    Assessment/Plan Adnexal mass and pelvic pain, s/p  Robotic-assisted right salpingo-oophorectomy, lysis of adhesions, exploratory laparotomy, repair of serosal injury to distal sigmoid colon, 12/04/2012, [name], MD, & [name], MD. HIV infection;  highly resistant virus and is on a salvage regimen of tivicay, Prezista, Norvir and Truvada Herpes genitalis  Diabetes mellitus without complication, no meds currently- controlled with diet  GERD (gastroesophageal reflux disease)  Asthma  Anxiety/Depression/Bipolar disorder    Plan:  She is doing well I will take dressing off tomorrow.  She is back on her HIV, asthma and bipolar medicines. We would like to give her a day or so before advancing diet.  i will d/c her foley.  She is already up walking on her own.         LOS: 1 day    [name] 12/05/2012

## 2012-12-06 LAB — CBC
HCT: 39.1 % (ref 36.0–46.0)
MCH: 32.8 pg (ref 26.0–34.0)
MCV: 94.2 fL (ref 78.0–100.0)
RDW: 13.6 % (ref 11.5–15.5)
WBC: 14.5 10*3/uL — ABNORMAL HIGH (ref 4.0–10.5)

## 2012-12-06 LAB — COMPREHENSIVE METABOLIC PANEL
Albumin: 3.5 g/dL (ref 3.5–5.2)
BUN: 7 mg/dL (ref 6–23)
CO2: 26 mEq/L (ref 19–32)
Calcium: 9.6 mg/dL (ref 8.4–10.5)
Chloride: 104 mEq/L (ref 96–112)
Creatinine, Ser: 0.98 mg/dL (ref 0.50–1.10)
GFR calc non Af Amer: 76 mL/min — ABNORMAL LOW (ref >90)
Total Bilirubin: 0.4 mg/dL (ref 0.3–1.2)

## 2012-12-06 MED ORDER — POLYETHYLENE GLYCOL 3350 17 G PO PACK
17.0000 g | PACK | Freq: Every day | ORAL | Status: DC | PRN
  Administered 2012-12-06 – 2012-12-07 (×2): 17 g via ORAL
  Filled 2012-12-06: qty 1

## 2012-12-06 MED ORDER — HYDROCORTISONE 2.5 % RE CREA
TOPICAL_CREAM | Freq: Three times a day (TID) | RECTAL | Status: DC | PRN
  Administered 2012-12-06: 11:00:00 via RECTAL
  Filled 2012-12-06: qty 28.35

## 2012-12-06 MED ORDER — HYDROCORTISONE 2.5 % RE CREA
TOPICAL_CREAM | Freq: Four times a day (QID) | RECTAL | Status: DC | PRN
  Filled 2012-12-06: qty 28.35

## 2012-12-06 NOTE — Progress Notes (Signed)
Keep drain until she has BM.  Doing well

## 2012-12-06 NOTE — Progress Notes (Signed)
2 Days Post-Op  Subjective: She is doing well, tolerating full liquids well.  Complains of some trouble with hemorrhoids.   Objective: Vital signs in last 24 hours: Temp:  [98.6 F (37 C)-98.9 F (37.2 C)] 98.6 F (37 C) (04/23 0603) Pulse Rate:  [83-96] 83 (04/23 0603) Resp:  [18] 18 (04/23 0603) BP: (130-138)/(79-86) 138/82 mmHg (04/23 0603) SpO2:  [99 %-100 %] 100 % (04/23 0603) Last BM Date: 12/04/12 1680 Po recorded, 20 ml from the drain, Afebrile, VSS, Labs pending  Intake/Output from previous day: 04/22 0701 - 04/23 0700 In: 2880 [P.O.:1680; I.V.:1200] Out: 5185 [Urine:5165; Drains:20] Intake/Output this shift: Total I/O In: -  Out: 600 [Urine:600]  General appearance: alert, cooperative and no distress Resp: clear to auscultation bilaterally GI: soft, incisions look good, drainage is clear. few BS, and some flatus.  Lab Results:   Recent Labs  12/04/12 1606  WBC 18.2*  HGB 12.3  HCT 35.5*  PLT 230    BMET  Recent Labs  12/04/12 1606  CREATININE 1.10   PT/INR No results found for this basename: LABPROT, INR,  in the last 72 hours  No results found for this basename: AST, ALT, ALKPHOS, BILITOT, PROT, ALBUMIN,  in the last 168 hours   Lipase  No results found for this basename: lipase     Studies/Results: No results found.  Medications: . amitriptyline  25 mg Oral QHS  . darunavir  800 mg Oral QHS  . Dolutegravir Sodium  50 mg Oral QHS  . DULoxetine  30 mg Oral Daily  . emtricitabine-tenofovir  1 tablet Oral QHS  . enoxaparin (LOVENOX) injection  40 mg Subcutaneous Q24H  . fluticasone  2 spray Each Nare Daily  . fluticasone  1 puff Inhalation BID  . lithium carbonate  450 mg Oral Q breakfast  . lithium carbonate  900 mg Oral QHS  . loratadine  10 mg Oral BID  . ritonavir  100 mg Oral QHS  . valACYclovir  500 mg Oral Daily  . ziprasidone  40 mg Oral Once  . ziprasidone  40 mg Oral QHS    Assessment/Plan Adnexal mass and pelvic pain,  s/p Robotic-assisted right salpingo-oophorectomy, lysis of adhesions, exploratory laparotomy, repair of serosal injury to distal sigmoid colon, 12/04/2012, [name], MD, & [name], MD. (path still pending) HIV infection; highly resistant virus and is on a salvage regimen of tivicay, Prezista, Norvir and Truvada  Herpes genitalis  Diabetes mellitus without complication, no meds currently- controlled with diet  GERD (gastroesophageal reflux disease)  Asthma  Anxiety/Depression/Bipolar disorder   Plan:  Sitz bath PRN, miralax, and proctosol prn. Continue to mobilize.  If she does well she may be able to go home tomorrow.  We hope to take drain out before d/c if it remains clear.     LOS: 2 days    Rayen Palen 12/06/2012

## 2012-12-07 ENCOUNTER — Encounter (HOSPITAL_COMMUNITY): Payer: Self-pay | Admitting: General Surgery

## 2012-12-07 DIAGNOSIS — J45909 Unspecified asthma, uncomplicated: Secondary | ICD-10-CM | POA: Diagnosis present

## 2012-12-07 DIAGNOSIS — E119 Type 2 diabetes mellitus without complications: Secondary | ICD-10-CM

## 2012-12-07 DIAGNOSIS — F411 Generalized anxiety disorder: Secondary | ICD-10-CM

## 2012-12-07 DIAGNOSIS — B20 Human immunodeficiency virus [HIV] disease: Secondary | ICD-10-CM

## 2012-12-07 DIAGNOSIS — A6009 Herpesviral infection of other urogenital tract: Secondary | ICD-10-CM

## 2012-12-07 DIAGNOSIS — K219 Gastro-esophageal reflux disease without esophagitis: Secondary | ICD-10-CM | POA: Diagnosis present

## 2012-12-07 DIAGNOSIS — F319 Bipolar disorder, unspecified: Secondary | ICD-10-CM

## 2012-12-07 HISTORY — DX: Bipolar disorder, unspecified: F31.9

## 2012-12-07 HISTORY — DX: Herpesviral infection of other urogenital tract: A60.09

## 2012-12-07 HISTORY — DX: Generalized anxiety disorder: F41.1

## 2012-12-07 HISTORY — DX: Unspecified asthma, uncomplicated: J45.909

## 2012-12-07 HISTORY — DX: Human immunodeficiency virus (HIV) disease: B20

## 2012-12-07 HISTORY — DX: Gastro-esophageal reflux disease without esophagitis: K21.9

## 2012-12-07 HISTORY — DX: Type 2 diabetes mellitus without complications: E11.9

## 2012-12-07 MED ORDER — POLYETHYLENE GLYCOL 3350 17 G PO PACK
17.0000 g | PACK | Freq: Every day | ORAL | Status: DC
  Administered 2012-12-07: 17 g via ORAL
  Filled 2012-12-07: qty 1

## 2012-12-07 MED ORDER — OXYCODONE HCL 5 MG PO TABS: 5.0000 mg | ORAL_TABLET | ORAL | Status: DC | PRN

## 2012-12-07 MED ORDER — ACETAMINOPHEN 325 MG PO TABS: 650.0000 mg | ORAL_TABLET | Freq: Four times a day (QID) | ORAL | Status: DC | PRN

## 2012-12-07 MED ORDER — POLYETHYLENE GLYCOL 3350 17 G PO PACK: PACK | ORAL | Status: DC

## 2012-12-07 NOTE — Progress Notes (Signed)
3 Days Post-Op  Subjective: Feels pretty good, lots of gas, but no BM.  Drainage is clear.  Objective: Vital signs in last 24 hours: Temp:  [98 F (36.7 C)-98.6 F (37 C)] 98.6 F (37 C) (04/24 0500) Pulse Rate:  [88-92] 92 (04/24 0500) Resp:  [18] 18 (04/24 0500) BP: (133-138)/(66-91) 137/66 mmHg (04/24 0500) SpO2:  [97 %-100 %] 100 % (04/24 0500) Last BM Date: 12/04/12 35 ml thru the drain, no bm recorded, Diet: DIII No labs Afebrile,VSS  Intake/Output from previous day: 04/23 0701 - 04/24 0700 In: 2760 [P.O.:360; I.V.:2400] Out: 2885 [Urine:2850; Drains:35] Intake/Output this shift:    General appearance: alert, cooperative and no distress GI: soft, + BS and flatus,  Incision is fine, drainage is clear. Tolerating DIII diet.  Lab Results:   Recent Labs  12/04/12 1606 12/06/12 0827  WBC 18.2* 14.5*  HGB 12.3 13.6  HCT 35.5* 39.1  PLT 230 285    BMET  Recent Labs  12/04/12 1606 12/06/12 0827  NA  --  138  K  --  3.6  CL  --  104  CO2  --  26  GLUCOSE  --  172*  BUN  --  7  CREATININE 1.10 0.98  CALCIUM  --  9.6   PT/INR No results found for this basename: LABPROT, INR,  in the last 72 hours   Recent Labs Lab 12/06/12 0827  AST 17  ALT 16  ALKPHOS 65  BILITOT 0.4  PROT 7.2  ALBUMIN 3.5     Lipase  No results found for this basename: lipase     Studies/Results: No results found.  Medications: . amitriptyline  25 mg Oral QHS  . darunavir  800 mg Oral QHS  . Dolutegravir Sodium  50 mg Oral QHS  . DULoxetine  30 mg Oral Daily  . emtricitabine-tenofovir  1 tablet Oral QHS  . enoxaparin (LOVENOX) injection  40 mg Subcutaneous Q24H  . fluticasone  2 spray Each Nare Daily  . fluticasone  1 puff Inhalation BID  . lithium carbonate  450 mg Oral Q breakfast  . lithium carbonate  900 mg Oral QHS  . loratadine  10 mg Oral BID  . ritonavir  100 mg Oral QHS  . valACYclovir  500 mg Oral Daily  . ziprasidone  40 mg Oral Once  . ziprasidone   40 mg Oral QHS    Assessment/Plan Adnexal mass and pelvic pain, s/p Robotic-assisted right salpingo-oophorectomy, lysis of adhesions, exploratory laparotomy, repair of serosal injury to distal sigmoid colon, 12/04/2012, [name], MD, & [name], MD. (path still pending)  HIV infection; highly resistant virus and is on a salvage regimen of tivicay, Prezista, Norvir and Truvada  Herpes genitalis  Diabetes mellitus without complication, no meds currently- controlled with diet  GERD (gastroesophageal reflux disease)  Asthma  Anxiety/Depression/Bipolar disorder   Plan:  Home after BM and clear drainage from the drain. CBC in AM.   LOS: 3 days    [name] 12/07/2012

## 2012-12-07 NOTE — Progress Notes (Signed)
LOOKS WELL.  AWAIT bm BEFORE PULLING DRAIN.

## 2012-12-07 NOTE — Discharge Summary (Signed)
Physician Discharge Summary  Patient ID: [name] MRN: [id number] DOB/AGE: [birth year] 31 y.o.  Admit date: 12/04/2012 Discharge date: 12/07/2012  Admission Diagnoses:  Right adnexal mass HIV infection; highly resistant virus and is on a salvage regimen of tivicay, Prezista, Norvir and Truvada  Herpes genitalis  Diabetes mellitus without complication, no meds currently- controlled with diet  GERD (gastroesophageal reflux disease)  Asthma  Anxiety/Depression/Bipolar disorder   Discharge Diagnoses:  Adnexal mass, pelvic pain and pelvic adhesions, omental adhesions, serosal injury to distal sigmoid colon   HIV infection; highly resistant virus and is on a salvage regimen of tivicay, Prezista, Norvir and Truvada  Herpes genitalis  Diabetes mellitus without complication, no meds currently- controlled with diet  GERD (gastroesophageal reflux disease)  Asthma  Anxiety/Depression/Bipolar disorder    Active Problems:   Pelvic mass in female   Human immunodeficiency virus (HIV) disease   Bipolar disorder, unspecified   Type II or unspecified type diabetes mellitus without mention of complication, not stated as uncontrolled   Herpes genitalis in women   Anxiety state, unspecified   Unspecified asthma   GERD (gastroesophageal reflux disease)   PROCEDURES: Robotic-assisted right salpingo-oophorectomy, lysis of adhesions, exploratory laparotomy, repair of serosal injury to distal sigmoid colon  [name], MD, 12/04/2012   Intraoperative consult:Serosal injury distal sigmoid colon. Exploratory laparotomy repair of serosal injury distal sigmoid colon, 12/04/2012, [name], MD.        Hospital Course: The patient has a h/o a prior laparoscopic ovarian cystectomy for a BCT. She presented with pelvic pain. An U/S showed a right adnexal mass with radiologic features suggestive of a BCT--3 cm in diameter. She was admitted by Dr. Tamela Oddi for surgery; ROBOTIC  ASSISTED Right SALPINGO OOPHORECTOMY.  During the procedure there was a serosal injury to the distal sigmoid colon and Dr. Luisa Hart came to assist.  She required the above procedure.  [facility].She has done very well post op.  Drain placed during the surgery remains clear even after BM. It was Dr. Rosezena Sensor opinion she could go home today after BM.  She is on a low fiber diet. The drain has been removed.  She will get her staples out next week and then see Dr. Luisa Hart in 2 weeks.  Follow up with Dr. Tamela Oddi 1-2 weeks. Follow up next week our office    Disposition: 01-Home or Self Care       Future Appointments Provider Department Dept Phone   12/12/2012 12:45 PM [facility] [place] [phone]   12/26/2012 10:30 AM [facility] [phone]   01/09/2013 10:15 AM Gardiner Barefoot, MD [facility] [phone]       Medication List    STOP taking these medications       HYDROcodone-acetaminophen 7.5-500 MG per tablet  Commonly known as:  LORTAB      TAKE these medications       acetaminophen 325 MG tablet  Commonly known as:  TYLENOL  Take 2 tablets (650 mg total) by mouth every 6 (six) hours as needed for pain.     albuterol 108 (90 BASE) MCG/ACT inhaler  Commonly known as:  PROVENTIL HFA;VENTOLIN HFA  Inhale 2 puffs into the lungs every 6 (six) hours as needed for wheezing.     amitriptyline 25 MG tablet  Commonly known as:  ELAVIL  Take  25 mg by mouth at bedtime.     beclomethasone 40 MCG/ACT inhaler  Commonly known as:  QVAR  Inhale 2 puffs into the lungs 2 (two) times daily.     Darunavir Ethanolate 800 MG tablet  Commonly known as:  PREZISTA  Take 1 tablet (800 mg total) by mouth daily.     Dolutegravir Sodium 50 MG Tabs  Commonly known as:  TIVICAY  Take 1 tablet (50 mg total)  by mouth daily.     DULoxetine 30 MG capsule  Commonly known as:  CYMBALTA  Take 30 mg by mouth daily.     emtricitabine-tenofovir 200-300 MG per tablet  Commonly known as:  TRUVADA  Take 1 tablet by mouth daily.     flunisolide 29 MCG/ACT (0.025%) nasal spray  Commonly known as:  NASAREL  Place 2 sprays into the nose at bedtime. Dose is for each nostril.     lithium carbonate 150 MG capsule  Take 450 mg by mouth daily with breakfast.     lithium carbonate 300 MG capsule  Take 900 mg by mouth at bedtime.     multivitamin-prenatal 27-0.8 MG Tabs  Take 2 tablets by mouth daily at 12 noon. Uses Gummies.     oxyCODONE 5 MG immediate release tablet  Commonly known as:  Oxy IR/ROXICODONE  Take 1-2 tablets (5-10 mg total) by mouth every 4 (four) hours as needed.     polyethylene glycol packet  Commonly known as:  MIRALAX / GLYCOLAX  Use as needed for 1-2 smooth Bowel movements per day.     ritonavir 100 MG capsule  Commonly known as:  NORVIR  Take 100 mg by mouth daily with breakfast.     SINGULAIR 10 MG tablet  Generic drug:  montelukast  Take 10 mg by mouth at bedtime.     valACYclovir 500 MG tablet  Commonly known as:  VALTREX  Take 1 tablet (500 mg total) by mouth daily.     Vitamin D (Ergocalciferol) 50000 UNITS Caps  Commonly known as:  DRISDOL  Take 50,000 Units by mouth every 7 (seven) days.     ziprasidone 40 MG capsule  Commonly known as:  GEODON  Take 40 mg by mouth at bedtime.       Follow-up Information   Follow up with [name] A, MD. Schedule an appointment as soon as possible for a visit in 1 week.   Contact information:   [address] [place] [address] [phone]       Follow up with CORNETT,Harwood A., MD. Schedule an appointment as soon as possible for a visit in 2 weeks.   Contact information:   [address] [place] [address] [phone]       Follow up with [facility] [place] On  12/12/2012. (Staple removal in the [facility] clinic.  Be there at 12:15 for check in and then your appointmet is for 12:45 PM)    Contact information:   [address]   [place] [address] [phone]       Schedule an appointment as soon as possible for a visit with Dorrene German, MD. (As needed)    Contact information:   [address] [place] [address] [phone]       Signed: Sherrie George 12/07/2012, 11:48 AM

## 2012-12-07 NOTE — Care Management (Signed)
CM spoke with patient concerning discharge planning. Pt states being followed by PCCM in outpatient setting. Cm spoke with PCCM Case Manager Wannetta Sender [facility] whom will see patient at home upon discharge. Pt states no Hh services required at this time. Pt's mother to assist in home care. Pt states if further desires [facility] services will address with PCCM Cm. No other needs stated at this time.   Roxy Manns Quinlyn Tep,RN,BSN [phone]

## 2012-12-07 NOTE — Discharge Summary (Signed)
Looks well.  BM today without problem  Drain removed.

## 2012-12-12 ENCOUNTER — Encounter (INDEPENDENT_AMBULATORY_CARE_PROVIDER_SITE_OTHER): Payer: Self-pay | Admitting: Internal Medicine

## 2012-12-12 ENCOUNTER — Ambulatory Visit (INDEPENDENT_AMBULATORY_CARE_PROVIDER_SITE_OTHER): Payer: Medicaid Other | Admitting: Internal Medicine

## 2012-12-12 VITALS — BP 126/78 | HR 90 | Temp 98.2°F | Ht 63.5 in | Wt 190.6 lb

## 2012-12-12 DIAGNOSIS — Z5189 Encounter for other specified aftercare: Secondary | ICD-10-CM

## 2012-12-12 DIAGNOSIS — S36503D Unspecified injury of sigmoid colon, subsequent encounter: Secondary | ICD-10-CM

## 2012-12-12 NOTE — Progress Notes (Signed)
Here for staple removal  No major problems  Tolerating diet  Bowels moving  Will remove staples  Steri-stripes  Do not lift  Follow up with Dr. Luisa Hart in Two Weeks.  Call with questions or concerns.  WHITE, ELIZABETH 12:20 PM 12/12/2012

## 2012-12-12 NOTE — Patient Instructions (Signed)
Will remove staples  Steri-stripes  Do not lift  Follow up with Dr. Luisa Hart in Two Weeks.  Call with questions or concerns.

## 2012-12-14 ENCOUNTER — Ambulatory Visit: Payer: Medicaid Other | Admitting: Internal Medicine

## 2012-12-15 ENCOUNTER — Ambulatory Visit (INDEPENDENT_AMBULATORY_CARE_PROVIDER_SITE_OTHER): Payer: Medicaid Other | Admitting: Surgery

## 2012-12-15 ENCOUNTER — Encounter (INDEPENDENT_AMBULATORY_CARE_PROVIDER_SITE_OTHER): Payer: Self-pay | Admitting: Surgery

## 2012-12-15 ENCOUNTER — Telehealth (INDEPENDENT_AMBULATORY_CARE_PROVIDER_SITE_OTHER): Payer: Self-pay | Admitting: General Surgery

## 2012-12-15 VITALS — BP 126/83 | HR 70 | Temp 98.4°F | Resp 16 | Ht 63.0 in | Wt 190.6 lb

## 2012-12-15 DIAGNOSIS — F411 Generalized anxiety disorder: Secondary | ICD-10-CM

## 2012-12-15 DIAGNOSIS — R1900 Intra-abdominal and pelvic swelling, mass and lump, unspecified site: Secondary | ICD-10-CM

## 2012-12-15 DIAGNOSIS — S36539D Laceration of unspecified part of colon, subsequent encounter: Secondary | ICD-10-CM

## 2012-12-15 DIAGNOSIS — S36539A Laceration of unspecified part of colon, initial encounter: Secondary | ICD-10-CM | POA: Insufficient documentation

## 2012-12-15 DIAGNOSIS — Z5189 Encounter for other specified aftercare: Secondary | ICD-10-CM

## 2012-12-15 MED ORDER — TRAMADOL HCL 50 MG PO TABS: 50.0000 mg | ORAL_TABLET | Freq: Four times a day (QID) | ORAL | Status: DC | PRN

## 2012-12-15 NOTE — Patient Instructions (Signed)
WOUND CARE  It is important that the wound be kept open.   -Keeping the skin edges apart will allow the wound to gradually heal from the base upwards.   -This is why drained wounds cannot be sewed closed right away  A healthy wound should form a lining of bright red "beefy" granulating tissue that will help shrink the wound and help the edges grow new skin into it.   -A little mucus / yellow discharge is normal (the body's natural way to try and form a scab) and should be gently washed off with soap and water with daily dressing changes.  -Green or foul smelling drainage implies bacterial colonization and can slow wound healing - a short course of antibiotic ointment (3-5 days) can help it clear up.  Call the doctor if it does not improve or worsens  -Avoid use of antibiotic ointments for more than a week as they can slow wound healing over time.    -Sometimes other wound care products will be used to reduce need for dressing changes and/or help clean up dirty wounds -Sometimes the surgeon needs to debride the wound in the office to remove dead or infected tissue out of the wound so it can heal more quickly and safely.    Change the dressing at least once a day -Wash the wound with mild soap and water gently every day.  It is good to shower or bathe the wound to help it clean out. -Use clean 4x4 gauze for medium/large wounds or ribbon plain NU-gauze for smaller wounds (it does not need to be sterile, just clean) -Keep the raw wound moist with a little saline or KY (saline) gel on the gauze.  -A dry wound will take longer to heal.  -Keep the skin dry around the wound to prevent breakdown and irritation.  -Cover with a clean gauze and tape (vasilene gauze)  -paper or Medipore tape tend to be gentle on the skin -rotate the orientation of the tape to avoid repeated stress/trauma on the skin -using an ACE or Coban wrap on wounds on arms or legs can be used instead.  Complete all antibiotics  through the entire prescription to help the infection heal and prevent new places of infection   Returning the see the surgeon is helpful to follow the healing process and help the wound close as fast as possible.    Managing Pain  Pain after surgery or related to activity is often due to strain/injury to muscle, tendon, nerves and/or incisions.  This pain is usually short-term and will improve in a few months.   Many people find it helpful to do the following things TOGETHER to help speed the process of healing and to get back to regular activity more quickly:  1. Avoid heavy physical activity a.  no lifting greater than 20 pounds b. Do not "push through" the pain.  Listen to your body and avoid positions and maneuvers than reproduce the pain c. Walking is okay as tolerated, but go slowly and stop when getting sore.  d. Remember: If it hurts to do it, then don't do it! 2. Take Anti-inflammatory medication  a. Take with food/snack around the clock for 1-2 weeks i. This helps the muscle and nerve tissues become less irritable and calm down faster b. Choose ONE of the following over-the-counter medications: i. Naproxen 220mg  tabs (ex. Aleve) 1-2 pills twice a day  ii. Ibuprofen 200mg  tabs (ex. Advil, Motrin) 3-4 pills with every meal and just  before bedtime iii. Acetaminophen 500mg  tabs (Tylenol) 1-2 pills with every meal and just before bedtime 3. Use a Heating pad or Ice/Cold Pack a. 4-6 times a day b. May use warm bath/hottub  or showers 4. Try Gentle Massage and/or Stretching  a. at the area of pain many times a day b. stop if you feel pain - do not overdo it  Try these steps together to help you body heal faster and avoid making things get worse.  Doing just one of these things may not be enough.    If you are not getting better after two weeks or are noticing you are getting worse, contact our office for further advice; we may need to re-evaluate you & see what other things we  can do to help.    GETTING TO GOOD BOWEL HEALTH. Irregular bowel habits such as constipation and diarrhea can lead to many problems over time.  Having one soft bowel movement a day is the most important way to prevent further problems.  The anorectal canal is designed to handle stretching and feces to safely manage our ability to get rid of solid waste (feces, poop, stool) out of our body.  BUT, hard constipated stools can act like ripping concrete bricks and diarrhea can be a burning fire to this very sensitive area of our body, causing inflamed hemorrhoids, anal fissures, increasing risk is perirectal abscesses, abdominal pain/bloating, an making irritable bowel worse.     The goal: ONE SOFT BOWEL MOVEMENT A DAY!  To have soft, regular bowel movements:    Drink at least 8 tall glasses of water a day.     Take plenty of fiber.  Fiber is the undigested part of plant food that passes into the colon, acting s "natures broom" to encourage bowel motility and movement.  Fiber can absorb and hold large amounts of water. This results in a larger, bulkier stool, which is soft and easier to pass. Work gradually over several weeks up to 6 servings a day of fiber (25g a day even more if needed) in the form of: o Vegetables -- Root (potatoes, carrots, turnips), leafy green (lettuce, salad greens, celery, spinach), or cooked high residue (cabbage, broccoli, etc) o Fruit -- Fresh (unpeeled skin & pulp), Dried (prunes, apricots, cherries, etc ),  or stewed ( applesauce)  o Whole grain breads, pasta, etc (whole wheat)  o Bran cereals    Bulking Agents -- This type of water-retaining fiber generally is easily obtained each day by one of the following:  o Psyllium bran -- The psyllium plant is remarkable because its ground seeds can retain so much water. This product is available as Metamucil, Konsyl, Effersyllium, Per Diem Fiber, or the less expensive generic preparation in drug and health food stores. Although labeled  a laxative, it really is not a laxative.  o Methylcellulose -- This is another fiber derived from wood which also retains water. It is available as Citrucel. o Polyethylene Glycol - and "artificial" fiber commonly called Miralax or Glycolax.  It is helpful for people with gassy or bloated feelings with regular fiber o Flax Seed - a less gassy fiber than psyllium   No reading or other relaxing activity while on the toilet. If bowel movements take longer than 5 minutes, you are too constipated   AVOID CONSTIPATION.  High fiber and water intake usually takes care of this.  Sometimes a laxative is needed to stimulate more frequent bowel movements, but    Laxatives are  not a good long-term solution as it can wear the colon out. o Osmotics (Milk of Magnesia, Fleets phosphosoda, Magnesium citrate, MiraLax, GoLytely) are safer than  o Stimulants (Senokot, Castor Oil, Dulcolax, Ex Lax)    o Do not take laxatives for more than 7days in a row.    IF SEVERELY CONSTIPATED, try a Bowel Retraining Program: o Do not use laxatives.  o Eat a diet high in roughage, such as bran cereals and leafy vegetables.  o Drink six (6) ounces of prune or apricot juice each morning.  o Eat two (2) large servings of stewed fruit each day.  o Take one (1) heaping tablespoon of a psyllium-based bulking agent twice a day. Use sugar-free sweetener when possible to avoid excessive calories.  o Eat a normal breakfast.  o Set aside 15 minutes after breakfast to sit on the toilet, but do not strain to have a bowel movement.  o If you do not have a bowel movement by the third day, use an enema and repeat the above steps.    Controlling diarrhea o Switch to liquids and simpler foods for a few days to avoid stressing your intestines further. o Avoid dairy products (especially milk & ice cream) for a short time.  The intestines often can lose the ability to digest lactose when stressed. o Avoid foods that cause gassiness or bloating.   Typical foods include beans and other legumes, cabbage, broccoli, and dairy foods.  Every person has some sensitivity to other foods, so listen to our body and avoid those foods that trigger problems for you. o Adding fiber (Citrucel, Metamucil, psyllium, Miralax) gradually can help thicken stools by absorbing excess fluid and retrain the intestines to act more normally.  Slowly increase the dose over a few weeks.  Too much fiber too soon can backfire and cause cramping & bloating. o Probiotics (such as active yogurt, Align, etc) may help repopulate the intestines and colon with normal bacteria and calm down a sensitive digestive tract.  Most studies show it to be of mild help, though, and such products can be costly. o Medicines:   Bismuth subsalicylate (ex. Kayopectate, Pepto Bismol) every 30 minutes for up to 6 doses can help control diarrhea.  Avoid if pregnant.   Loperamide (Immodium) can slow down diarrhea.  Start with two tablets (4mg  total) first and then try one tablet every 6 hours.  Avoid if you are having fevers or severe pain.  If you are not better or start feeling worse, stop all medicines and call your doctor for advice o Call your doctor if you are getting worse or not better.  Sometimes further testing (cultures, endoscopy, X-ray studies, bloodwork, etc) may be needed to help diagnose and treat the cause of the diarrhea. o

## 2012-12-15 NOTE — Progress Notes (Signed)
Subjective:     Patient ID: [name], female   DOB: 08/19/1980, 32 y.o.   MRN: [id number]  HPI  [name]  12-10-1980 [id number]  Patient Care Team: Dorrene German, MD as PCP - General (Internal Medicine) Raul Del as PCP - Infectious Diseases (Infectious Diseases) [name], MD as Attending Physician (Obstetrics and Gynecology) [name], MD as Consulting Physician (General Surgery)  This patient is a 32 y.o.female who presents today for surgical evaluation at the request of self.   Reason for visit: Concern of wound dehiscence and bleeding.  Wanting wound stitched shut.  Obese HIV positive diabetic anxious female.  Required colon serosal repair emergently by Dr. Luisa Hart by intraop consult requested by her gynecologist whom was performing a robotic oophorectomy.  Eventually went home.  Came in to have skin staples removed POD# 8 and switched over to Steri-Strips.  Had a little mild separation at the most inferior part of the wound.  Our PA recommend she keep it covered to let it heal by secondary intention.  Patient had episode of bleeding underneath the dressing.  It scared her.  She was afraid to touch it.  Her 33 year old held pressure and it calmed down.  She called concerned.  We fit her in today.  I had my female assistant, Kendall Flack, present in the room at all times.  Patient is eating okay.  Frustrated that her postoperative recovery has not been smooth.  Appetite not totally normal.  Sore.  Using ibuprofen only.  Afraid to take oxycodone as it may be too sedating since she is a single mother.  She comes today with a case manager whom wishes to help advocate for the patient.  She is wondering if she could get home health dressing changes are needed.  She has noticed painful blisters around the Steri-Strips.  Patient Active Problem List   Diagnosis Date Noted  . s/p Serosal repair of colon 12/04/2012 12/15/2012  . Herpes genitalis in  women 12/07/2012  . Bipolar disorder, unspecified 12/07/2012  . Anxiety state, unspecified 12/07/2012  . Type II or unspecified type diabetes mellitus without mention of complication, not stated as uncontrolled 12/07/2012  . Unspecified asthma 12/07/2012  . GERD (gastroesophageal reflux disease) 12/07/2012  . Pelvic mass in female s/p Robotic-assisted right salpingo-oophorectomy 12/04/2012  . Herpes genitalis 08/03/2012  . SHINGLES 01/31/2009  . HEMORRHOIDS NOS, W/O COMPLICATIONS 12/07/2006  . DISORDER, DEPRESSIVE NEC 11/25/2006  . ALLERGIC RHINITIS 11/25/2006  . 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 Human immunodeficiency virus (HIV) disease 11/10/2006    Past Medical History  Diagnosis Date  . HIV infection   . Anxiety   . Depression   . Asthma   . Diabetes mellitus without complication     no meds currently- controlled with diet  . GERD (gastroesophageal reflux disease)   . Neuromuscular disorder     carpal tunnel in both arms  . Human immunodeficiency virus (HIV) disease 12/07/2012  . Herpes genitalis in women 12/07/2012  . Bipolar disorder, unspecified 12/07/2012  . Anxiety state, unspecified 12/07/2012  . Type II or unspecified type diabetes mellitus without mention of complication, not stated as uncontrolled 12/07/2012  . Unspecified asthma 12/07/2012  . GERD (gastroesophageal reflux disease) 12/07/2012    Past Surgical History  Procedure Laterality Date  . Cesarean section    . Robotic assisted bilateral salpingo oopherectomy Right 12/04/2012    Procedure: ROBOTIC ASSISTED Right  SALPINGO OOPHORECTOMY;  Surgeon: [name], MD;  Location: [facility];  Service: Gynecology;  Laterality: Right;  . Laparotomy N/A 12/04/2012    Procedure: EXPLORATORY LAPAROTOMY;  Surgeon: [name], MD;  Location: [facility];  Service: Gynecology;  Laterality: N/A;  repair of serosal injury  . Laparoscopic lysis of adhesions N/A 12/04/2012    Procedure: LAPAROSCOPIC LYSIS OF ADHESIONS;   Surgeon: [name], MD;  Location: [facility];  Service: Gynecology;  Laterality: N/A;    History   Social History  . Marital Status: Single    Spouse Name: N/A    Number of Children: N/A  . Years of Education: N/A   Occupational History  . Not on file.   Social History Main Topics  . Smoking status: Never Smoker   . Smokeless tobacco: Never Used  . Alcohol Use: No  . Drug Use: No  . Sexually Active: Not Currently -- Female partner(s)    Birth Control/ Protection: Injection     Comment: pt. declined condoms   Other Topics Concern  . Not on file   Social History Narrative  . No narrative on file    Family History  Problem Relation Age of Onset  . Arthritis/Rheumatoid Mother   . Hypertension Mother     Current Outpatient Prescriptions  Medication Sig Dispense Refill  . acetaminophen (TYLENOL) 325 MG tablet Take 2 tablets (650 mg total) by mouth every 6 (six) hours as needed for pain.      Marland Kitchen albuterol (PROVENTIL HFA;VENTOLIN HFA) 108 (90 BASE) MCG/ACT inhaler Inhale 2 puffs into the lungs every 6 (six) hours as needed for wheezing.      Marland Kitchen amitriptyline (ELAVIL) 25 MG tablet Take 25 mg by mouth at bedtime.      . beclomethasone (QVAR) 40 MCG/ACT inhaler Inhale 2 puffs into the lungs 2 (two) times daily.  1 Inhaler  5  . Darunavir Ethanolate (PREZISTA) 800 MG tablet Take 1 tablet (800 mg total) by mouth daily.  30 tablet  11  . Dolutegravir Sodium (TIVICAY) 50 MG TABS Take 1 tablet (50 mg total) by mouth daily.  30 tablet  5  . DULoxetine (CYMBALTA) 30 MG capsule Take 30 mg by mouth daily.      Marland Kitchen emtricitabine-tenofovir (TRUVADA) 200-300 MG per tablet Take 1 tablet by mouth daily.      . flunisolide (NASAREL) 29 MCG/ACT (0.025%) nasal spray Place 2 sprays into the nose at bedtime. Dose is for each nostril.  25 mL  12  . ibuprofen (ADVIL,MOTRIN) 200 MG tablet Take 200 mg by mouth every 6 (six) hours as needed for pain.      Marland Kitchen lithium carbonate 150 MG capsule Take 450 mg  by mouth daily with breakfast.      . lithium carbonate 300 MG capsule Take 900 mg by mouth at bedtime.      . montelukast (SINGULAIR) 10 MG tablet Take 10 mg by mouth at bedtime.      . Prenatal Vit-Fe Fumarate-FA (MULTIVITAMIN-PRENATAL) 27-0.8 MG TABS Take 2 tablets by mouth daily at 12 noon. Uses Gummies.      . ritonavir (NORVIR) 100 MG capsule Take 100 mg by mouth daily with breakfast.      . valACYclovir (VALTREX) 500 MG tablet Take 1 tablet (500 mg total) by mouth daily.  30 tablet  11  . Vitamin D, Ergocalciferol, (DRISDOL) 50000 UNITS CAPS Take 50,000 Units by mouth every 7 (seven) days.      . ziprasidone (GEODON) 40 MG capsule Take 40 mg by mouth at  bedtime.       . traMADol (ULTRAM) 50 MG tablet Take 1-2 tablets (50-100 mg total) by mouth every 6 (six) hours as needed for pain.  30 tablet  1   No current facility-administered medications for this visit.     Allergies  Allergen Reactions  . Latex Other (See Comments)    Only condoms cause irritation  . Sulfamethoxazole W-Trimethoprim Itching and Nausea And Vomiting    BP 126/83  Pulse 70  Temp(Src) 98.4 F (36.9 C) (Temporal)  Resp 16  Ht 5\' 3"  (1.6 m)  Wt 190 lb 9.6 oz (86.456 kg)  BMI 33.77 kg/m2  No results found.   Review of Systems  Constitutional: Negative for fever, chills and diaphoresis.  HENT: Negative for ear pain, sore throat and trouble swallowing.   Eyes: Negative for photophobia and visual disturbance.  Respiratory: Negative for cough and choking.   Cardiovascular: Negative for chest pain and palpitations.  Gastrointestinal: Positive for abdominal pain. Negative for nausea, vomiting, diarrhea, constipation, anal bleeding and rectal pain.  Genitourinary: Negative for dysuria, urgency, frequency and difficulty urinating.  Musculoskeletal: Positive for myalgias. Negative for gait problem.  Skin: Positive for wound. Negative for color change, pallor and rash.  Neurological: Negative for dizziness,  speech difficulty, weakness and numbness.  Hematological: Negative for adenopathy.  Psychiatric/Behavioral: Positive for dysphoric mood. Negative for confusion and agitation. The patient is nervous/anxious.        Objective:   Physical Exam  Constitutional: She is oriented to person, place, and time. She appears well-developed and well-nourished. No distress.  HENT:  Head: Normocephalic.  Mouth/Throat: Oropharynx is clear and moist. No oropharyngeal exudate.  Eyes: Conjunctivae and EOM are normal. Pupils are equal, round, and reactive to light. No scleral icterus.  Neck: Normal range of motion. No tracheal deviation present.  Cardiovascular: Normal rate and intact distal pulses.   Pulmonary/Chest: Effort normal. No respiratory distress. She exhibits no tenderness.  Abdominal: Soft. She exhibits no distension. There is no tenderness. There is no CVA tenderness. No hernia. Hernia confirmed negative in the right inguinal area and confirmed negative in the left inguinal area.    Obese but soft  Genitourinary: No vaginal discharge found.  Musculoskeletal: Normal range of motion. She exhibits no tenderness.  Lymphadenopathy:       Right: No inguinal adenopathy present.       Left: No inguinal adenopathy present.  Neurological: She is alert and oriented to person, place, and time. No cranial nerve deficit. She exhibits normal muscle tone. Coordination normal.  Skin: Skin is warm and dry. No rash noted. She is not diaphoretic.  Psychiatric: Her behavior is normal. Her mood appears anxious. Cognition and memory are normal. She exhibits a depressed mood.  Interrupting & questioning often.  Mildly pressured speech.  Slow to trust any action.       Assessment:     Status post serosal repair of colon injury - stable  Mild separation at skin of laparotomy wound in a diabetic HIV positive female.  Probable allergy to benzoin vs. Steri-Strips with blistering    Plan:     I went ahead and  removed the Steri-Strips and dressing using adhesive remover.  She tolerated it eventually.  Very jumpy & sensitive at the hairs.  Nearly grabbed my hands numerous times but eventually we got things off.  Skin covered closed.  I gently washed off the incision with soap/water over the wound to make sure there is no glue remaining.  I recommended that she shave her pubic hairs to keep it sticking to the tape.  I offered to do this, but she refused.  Because wounds are very superficial with no bleeding, I placed some Telfa over the incision.  I put some paper tape to minimize the chance of new blistering.  She seemed to tolerate that relatively well.  I see no evidence of infection.  I recommended just covering up the superficial wounds with Telfa or Vaseline gauze.   We gave her a sample of each.  Use KY jelly to keep the wounds moist if needed.  More likely better to let them dry out and scab over.  She wanted to do Neosporin.  Because there is no infection and it slows wound healing, I tried to dissuade her from that.    She is worried that it would need to be packed and could not handle that.  She wondered if it could be stitched.  I noted that was not a good idea as it can lead to trapped secretions and develop an abscess.  I am very skeptical that she could tolerate me placing stitches anyway.  I recommend she improve pain control.  She notes Tylenol does not work.  She does not like to take ibuprofen very often has bothers her stomach.  I recommend she try naproxen.  I did a prescription for 500 mg by mouth twice a day.  Also gave her prescription for tramadol area hopefully she can tolerate that better than oxycodone.  I recommended ice pack.  They wanted me to give her an ice pack.  I noted we did not have one in the office.  Increase activity as tolerated to regular activity.  Low impact exercise such as walking an hour a day at least ideal.  Do not push through pain.  Diet as tolerated.  Low fat  high fiber diet ideal.  Bowel regimen with 30 g fiber a day and fiber supplement as needed to avoid problems.  Return to clinic in 10 days for followup.    Instructions discussed.  Followup with primary care physician for other health issues as would normally be done.  Questions answered.  The patient expressed understanding and some appreciation.

## 2012-12-15 NOTE — Telephone Encounter (Signed)
[name], case manager, called from pt's home about some issues that are of concern to the pt.  First she is now alone at home to care for her wound and does not feel she can manage this, so wants Home Health to come by.  She is scheduled to come in for Urgent office today to address her wound and wants the wound closed (stitched up.)  Ms. [name] states pt is in pain and very emotional.  After speaking with pt on speaker-phone setting and getting verbal permission to discuss her medical situation with Ms Neal, both were then informed and encouraged to attend today's appt to allow Dr. Michaell Cowing to fully assess the wound and her appeal for home health.

## 2012-12-18 ENCOUNTER — Encounter: Payer: Self-pay | Admitting: Obstetrics & Gynecology

## 2012-12-18 ENCOUNTER — Other Ambulatory Visit: Payer: Self-pay | Admitting: Internal Medicine

## 2012-12-18 ENCOUNTER — Telehealth (INDEPENDENT_AMBULATORY_CARE_PROVIDER_SITE_OTHER): Payer: Self-pay

## 2012-12-18 NOTE — Telephone Encounter (Signed)
The pt called and states her paperwork from Friday said to complete all antibiotics.  She was not given any at discharge.  Please call and let her know if she needs some.

## 2012-12-18 NOTE — Telephone Encounter (Signed)
Pt aware.

## 2012-12-18 NOTE — Telephone Encounter (Signed)
This is a patient of Dr Harriette Bouillon. Dr Michaell Cowing did not give her antibiotics. He told her to finish any antibiotics her gynecologist may have written for her. IF she does not have any Dr Michaell Cowing states that is fine. He asks that additional questions be sent to Dr Luisa Hart.

## 2012-12-18 NOTE — Telephone Encounter (Signed)
I left a message for the pt to call and get the message below.

## 2012-12-25 ENCOUNTER — Encounter (INDEPENDENT_AMBULATORY_CARE_PROVIDER_SITE_OTHER): Payer: Self-pay | Admitting: Surgery

## 2012-12-25 ENCOUNTER — Encounter: Payer: Self-pay | Admitting: Obstetrics & Gynecology

## 2012-12-25 ENCOUNTER — Ambulatory Visit (INDEPENDENT_AMBULATORY_CARE_PROVIDER_SITE_OTHER): Payer: Medicaid Other | Admitting: Surgery

## 2012-12-25 ENCOUNTER — Ambulatory Visit (INDEPENDENT_AMBULATORY_CARE_PROVIDER_SITE_OTHER): Payer: Medicaid Other | Admitting: Obstetrics & Gynecology

## 2012-12-25 VITALS — BP 128/84 | HR 88 | Temp 97.2°F | Resp 16 | Ht 63.25 in | Wt 189.8 lb

## 2012-12-25 VITALS — BP 119/82 | HR 93 | Temp 98.5°F | Ht 64.0 in

## 2012-12-25 DIAGNOSIS — Z9889 Other specified postprocedural states: Secondary | ICD-10-CM

## 2012-12-25 DIAGNOSIS — Z09 Encounter for follow-up examination after completed treatment for conditions other than malignant neoplasm: Secondary | ICD-10-CM | POA: Insufficient documentation

## 2012-12-25 NOTE — Patient Instructions (Addendum)
Incision Care  An incision is when a surgeon cuts into your body tissues. After surgery, the incision needs to be cared for properly to prevent infection.   HOME CARE INSTRUCTIONS    Take all medicine as directed by your caregiver. Only take over-the-counter or prescription medicines for pain, discomfort, or fever as directed by your caregiver.   Do not remove your bandage (dressing) or get your incision wet until your surgeon gives you permission. In the event that your dressing becomes wet, dirty, or starts to smell, change the dressing and call your surgeon for instructions as soon as possible.   Take showers. Do not take tub baths, swim, or do anything that may soak the wound until it is healed.   Resume your normal diet and activities as directed or allowed.   Avoid lifting any weight until you are instructed otherwise.   Use anti-itch antihistamine medicine as directed by your caregiver. The wound may itch when it is healing. Do not pick or scratch at the wound.   Follow up with your caregiver for stitch (suture) or staple removal as directed.   Drink enough fluids to keep your urine clear or pale yellow.  SEEK MEDICAL CARE IF:    You have redness, swelling, or increasing pain in the wound that is not controlled with medicine.   You have drainage, blood, or pus coming from the wound that lasts longer than 1 day.   You develop muscle aches, chills, or a general ill feeling.   You notice a bad smell coming from the wound or dressing.   Your wound edges separate after the sutures, staples, or skin adhesive strips have been removed.   You develop persistent nausea or vomiting.  SEEK IMMEDIATE MEDICAL CARE IF:    You have a fever.   You develop a rash.   You develop dizzy episodes or faint while standing.   You have difficulty breathing.   You develop any reaction or side effects to medicine given.  MAKE SURE YOU:    Understand these instructions.   Will watch your condition.   Will get help  right away if you are not doing well or get worse.  Document Released: 02/19/2005 Document Revised: 10/25/2011 Document Reviewed: 12/06/2010  [employer] Patient Information 2013 [employer].

## 2012-12-25 NOTE — Progress Notes (Signed)
Patient returns after exploratory laparotomy one month ago. She underwent robotic oophorectomy and a subsequent injury to her colorectal junction was noted. I was called in to repair this. This required laparotomy. She is seen a few weeks ago for wound check. Her wound is now closed except for the inferior portion. She does have some incisional pain intermittently. No fever or chills. Bowels are functioning normally.  Exam: Lower midline incision healed. Inferior portion measuring 5 x 5 mm gradation tissue. Silver nitrate applied.  Impression: Status post laparotomy secondary to colorectal injury during a oophorectomy  Plan: Return as needed. Follow up with gynecology.

## 2012-12-25 NOTE — Patient Instructions (Signed)
Return as needed

## 2012-12-25 NOTE — Progress Notes (Signed)
.   Subjective:     [name] is a 32 y.o. female here for an incision check.  She had a right salpingo- oophorectomy on 12/04/12.  She just came from Dr. Luisa Hart and he applied silver nitrate to her incision site.  No current complaints.  Personal health questionnaire reviewed: no.   Gynecologic History No LMP recorded. Patient has had an injection. Contraception: Depo-Provera injections Last Pap: 01/2012 Results were: abnormal LSIL - colposcopy done 08/2012. Last mammogram: N/A  Obstetric History OB History   Grav Para Term Preterm Abortions TAB SAB Ect Mult Living                   The following portions of the patient's history were reviewed and updated as appropriate: allergies, current medications, past family history, past medical history, past social history, past surgical history and problem list.  Review of Systems Pertinent items are noted in HPI.    Objective:    Midline incision incision with small focus of granulation tissue at inferior margin.  Laparoscopic incisions well-healed.  Suture knot removed from the LUQ incision.  Assessment:    Superficial, small wound separation Plan:   Continue wound care Return in a few weeks

## 2012-12-26 ENCOUNTER — Other Ambulatory Visit: Payer: Medicaid Other

## 2012-12-27 ENCOUNTER — Telehealth (INDEPENDENT_AMBULATORY_CARE_PROVIDER_SITE_OTHER): Payer: Self-pay | Admitting: *Deleted

## 2012-12-27 NOTE — Telephone Encounter (Signed)
Patient called in to state she is having middle abdominal pain that nothing worsens or improves.  Patient also states change in stool color since Monday night.  Patient states no change in diet and states she is staying very hydrated.

## 2012-12-28 ENCOUNTER — Encounter (HOSPITAL_COMMUNITY): Payer: Self-pay | Admitting: Emergency Medicine

## 2012-12-28 ENCOUNTER — Telehealth (INDEPENDENT_AMBULATORY_CARE_PROVIDER_SITE_OTHER): Payer: Self-pay

## 2012-12-28 ENCOUNTER — Emergency Department (HOSPITAL_COMMUNITY)
Admission: EM | Admit: 2012-12-28 | Discharge: 2012-12-28 | Disposition: A | Discharge status: Home / Self Care | Payer: Medicaid Other | Attending: Emergency Medicine | Admitting: Emergency Medicine

## 2012-12-28 DIAGNOSIS — Z21 Asymptomatic human immunodeficiency virus [HIV] infection status: Secondary | ICD-10-CM | POA: Insufficient documentation

## 2012-12-28 DIAGNOSIS — F411 Generalized anxiety disorder: Secondary | ICD-10-CM | POA: Insufficient documentation

## 2012-12-28 DIAGNOSIS — Z91040 Latex allergy status: Secondary | ICD-10-CM | POA: Insufficient documentation

## 2012-12-28 DIAGNOSIS — R197 Diarrhea, unspecified: Secondary | ICD-10-CM

## 2012-12-28 DIAGNOSIS — Z79899 Other long term (current) drug therapy: Secondary | ICD-10-CM | POA: Insufficient documentation

## 2012-12-28 DIAGNOSIS — F329 Major depressive disorder, single episode, unspecified: Secondary | ICD-10-CM | POA: Insufficient documentation

## 2012-12-28 DIAGNOSIS — J45909 Unspecified asthma, uncomplicated: Secondary | ICD-10-CM | POA: Insufficient documentation

## 2012-12-28 DIAGNOSIS — E119 Type 2 diabetes mellitus without complications: Secondary | ICD-10-CM | POA: Insufficient documentation

## 2012-12-28 DIAGNOSIS — Z8619 Personal history of other infectious and parasitic diseases: Secondary | ICD-10-CM | POA: Insufficient documentation

## 2012-12-28 DIAGNOSIS — F3289 Other specified depressive episodes: Secondary | ICD-10-CM | POA: Insufficient documentation

## 2012-12-28 DIAGNOSIS — F319 Bipolar disorder, unspecified: Secondary | ICD-10-CM | POA: Insufficient documentation

## 2012-12-28 DIAGNOSIS — Z8669 Personal history of other diseases of the nervous system and sense organs: Secondary | ICD-10-CM | POA: Insufficient documentation

## 2012-12-28 DIAGNOSIS — Z888 Allergy status to other drugs, medicaments and biological substances status: Secondary | ICD-10-CM | POA: Insufficient documentation

## 2012-12-28 DIAGNOSIS — K219 Gastro-esophageal reflux disease without esophagitis: Secondary | ICD-10-CM | POA: Insufficient documentation

## 2012-12-28 LAB — CBC WITH DIFFERENTIAL/PLATELET
Basophils Absolute: 0 10*3/uL (ref 0.0–0.1)
Basophils Relative: 0 % (ref 0–1)
Eosinophils Absolute: 0.2 10*3/uL (ref 0.0–0.7)
Eosinophils Relative: 2 % (ref 0–5)
HCT: 39.2 % (ref 36.0–46.0)
Hemoglobin: 13.7 g/dL (ref 12.0–15.0)
Lymphocytes Relative: 29 % (ref 12–46)
Lymphs Abs: 3.1 10*3/uL (ref 0.7–4.0)
MCH: 33 pg (ref 26.0–34.0)
MCHC: 34.9 g/dL (ref 30.0–36.0)
MCV: 94.5 fL (ref 78.0–100.0)
Monocytes Absolute: 0.4 10*3/uL (ref 0.1–1.0)
Monocytes Relative: 4 % (ref 3–12)
Neutro Abs: 7 10*3/uL (ref 1.7–7.7)
Neutrophils Relative %: 65 % (ref 43–77)
Platelets: 282 10*3/uL (ref 150–400)
RBC: 4.15 MIL/uL (ref 3.87–5.11)
RDW: 13.4 % (ref 11.5–15.5)
WBC: 10.8 10*3/uL — ABNORMAL HIGH (ref 4.0–10.5)

## 2012-12-28 LAB — URINALYSIS, ROUTINE W REFLEX MICROSCOPIC
Bilirubin Urine: NEGATIVE
Glucose, UA: NEGATIVE mg/dL
Hgb urine dipstick: NEGATIVE
Ketones, ur: NEGATIVE mg/dL
Leukocytes, UA: NEGATIVE
Nitrite: NEGATIVE
Protein, ur: NEGATIVE mg/dL
Specific Gravity, Urine: 1.023 (ref 1.005–1.030)
Urobilinogen, UA: 1 mg/dL (ref 0.0–1.0)
pH: 7 (ref 5.0–8.0)

## 2012-12-28 LAB — COMPREHENSIVE METABOLIC PANEL
ALT: 16 U/L (ref 0–35)
AST: 18 U/L (ref 0–37)
Albumin: 3.4 g/dL — ABNORMAL LOW (ref 3.5–5.2)
Alkaline Phosphatase: 91 U/L (ref 39–117)
BUN: 13 mg/dL (ref 6–23)
CO2: 27 mEq/L (ref 19–32)
Calcium: 9.7 mg/dL (ref 8.4–10.5)
Chloride: 103 mEq/L (ref 96–112)
Creatinine, Ser: 0.97 mg/dL (ref 0.50–1.10)
GFR calc Af Amer: 89 mL/min — ABNORMAL LOW (ref >90)
GFR calc non Af Amer: 76 mL/min — ABNORMAL LOW (ref >90)
Glucose, Bld: 112 mg/dL — ABNORMAL HIGH (ref 70–99)
Potassium: 3.9 mEq/L (ref 3.5–5.1)
Sodium: 137 mEq/L (ref 135–145)
Total Bilirubin: 0.3 mg/dL (ref 0.3–1.2)
Total Protein: 7.2 g/dL (ref 6.0–8.3)

## 2012-12-28 MED ORDER — LOPERAMIDE HCL 2 MG PO CAPS
2.0000 mg | ORAL_CAPSULE | Freq: Once | ORAL | Status: AC
  Administered 2012-12-28: 2 mg via ORAL
  Filled 2012-12-28: qty 1

## 2012-12-28 MED ORDER — SODIUM CHLORIDE 0.9 % IV BOLUS (SEPSIS)
1000.0000 mL | Freq: Once | INTRAVENOUS | Status: AC
  Administered 2012-12-28: 1000 mL via INTRAVENOUS

## 2012-12-28 NOTE — Telephone Encounter (Signed)
Go to ED if not better. °

## 2012-12-28 NOTE — ED Notes (Signed)
Per pt, states green stool started Monday and diarrhea started yesterday-c/o pain behind belly button

## 2012-12-28 NOTE — Telephone Encounter (Signed)
Pt called wanting to see if Dr Luisa Hart responded back to the message left yesterday. I checked the notes and saw Dr Luisa Hart did respond back this am with the note of if pt not any better to go to ED. I gave this advise to pt b/c she states she is not any better since yesterday. The pt will go to the ED.

## 2012-12-29 ENCOUNTER — Telehealth (INDEPENDENT_AMBULATORY_CARE_PROVIDER_SITE_OTHER): Payer: Self-pay

## 2012-12-29 NOTE — Telephone Encounter (Signed)
LMOM to call back. Per Dr Luisa Hart go to ER if no better.

## 2013-01-01 ENCOUNTER — Telehealth (INDEPENDENT_AMBULATORY_CARE_PROVIDER_SITE_OTHER): Payer: Self-pay

## 2013-01-01 NOTE — Telephone Encounter (Signed)
Patient calling into office concerned with an increase of drainage and mucus from abd incision.  Patient s/p lap colectomy.  Patient given appointment in Urgent Office on 01/02/13 @ 3:00 pm w/Dr. Johna Sheriff.

## 2013-01-01 NOTE — ED Provider Notes (Signed)
History    32 year old female with diarrhea. Onset yesterday. Just preceding this patient noticed a day or 2 of green colored stool. No blood in her stool. Mild pain behind her bellybutton. Recent abdominal surgery. No fevers or chills. No urinary complaints. No unusual vaginal bleeding or discharge. No intervention prior to arrival.   CSN: [id number]  Arrival date & time 12/28/12  1710   First MD Initiated Contact with Patient 12/28/12 1713      Chief Complaint  Patient presents with  . Diarrhea    (Consider location/radiation/quality/duration/timing/severity/associated sxs/prior treatment) HPI  Past Medical History  Diagnosis Date  . HIV infection   . Anxiety   . Depression   . Asthma   . Diabetes mellitus without complication     no meds currently- controlled with diet  . GERD (gastroesophageal reflux disease)   . Neuromuscular disorder     carpal tunnel in both arms  . Human immunodeficiency virus (HIV) disease 12/07/2012  . Herpes genitalis in women 12/07/2012  . Bipolar disorder, unspecified 12/07/2012  . Anxiety state, unspecified 12/07/2012  . Type II or unspecified type diabetes mellitus without mention of complication, not stated as uncontrolled 12/07/2012  . Unspecified asthma 12/07/2012  . GERD (gastroesophageal reflux disease) 12/07/2012    Past Surgical History  Procedure Laterality Date  . Cesarean section    . Robotic assisted bilateral salpingo oopherectomy Right 12/04/2012    Procedure: ROBOTIC ASSISTED Right  SALPINGO OOPHORECTOMY;  Surgeon: [name], MD;  Location: [facility];  Service: Gynecology;  Laterality: Right;  . Laparotomy N/A 12/04/2012    Procedure: EXPLORATORY LAPAROTOMY;  Surgeon: [name], MD;  Location: [facility];  Service: Gynecology;  Laterality: N/A;  repair of serosal injury  . Laparoscopic lysis of adhesions N/A 12/04/2012    Procedure: LAPAROSCOPIC LYSIS OF ADHESIONS;  Surgeon: [name], MD;  Location: [facility];   Service: Gynecology;  Laterality: N/A;    Family History  Problem Relation Age of Onset  . Arthritis/Rheumatoid Mother   . Hypertension Mother     History  Substance Use Topics  . Smoking status: Never Smoker   . Smokeless tobacco: Never Used  . Alcohol Use: No    OB History   Grav Para Term Preterm Abortions TAB SAB Ect Mult Living                  Review of Systems All systems reviewed and negative, other than as noted in HPI.  Allergies  Tape; Latex; and Sulfamethoxazole w-trimethoprim  Home Medications   Current Outpatient Rx  Name  Route  Sig  Dispense  Refill  . acetaminophen (TYLENOL) 325 MG tablet   Oral   Take 2 tablets (650 mg total) by mouth every 6 (six) hours as needed for pain.         Marland Kitchen albuterol (PROVENTIL HFA;VENTOLIN HFA) 108 (90 BASE) MCG/ACT inhaler   Inhalation   Inhale 2 puffs into the lungs every 6 (six) hours as needed for wheezing.         Marland Kitchen amitriptyline (ELAVIL) 25 MG tablet   Oral   Take 25 mg by mouth at bedtime.         . beclomethasone (QVAR) 40 MCG/ACT inhaler   Inhalation   Inhale 2 puffs into the lungs 2 (two) times daily.   1 Inhaler   5   . Darunavir Ethanolate (PREZISTA) 800 MG tablet   Oral   Take 800 mg by mouth daily.         Marland Kitchen  Dolutegravir Sodium (TIVICAY) 50 MG TABS   Oral   Take 50 mg by mouth daily.         . DULoxetine (CYMBALTA) 30 MG capsule   Oral   Take 30 mg by mouth daily.         Marland Kitchen emtricitabine-tenofovir (TRUVADA) 200-300 MG per tablet   Oral   Take 1 tablet by mouth 2 (two) times daily.         . flunisolide (NASAREL) 29 MCG/ACT (0.025%) nasal spray   Nasal   Place 2 sprays into the nose at bedtime. Dose is for each nostril.   25 mL   12   . lithium carbonate 150 MG capsule   Oral   Take 450 mg by mouth daily with breakfast.         . lithium carbonate 300 MG capsule   Oral   Take 900 mg by mouth daily.          . montelukast (SINGULAIR) 10 MG tablet   Oral   Take  10 mg by mouth at bedtime.         . Prenatal Vit-Fe Fumarate-FA (MULTIVITAMIN-PRENATAL) 27-0.8 MG TABS   Oral   Take 2 tablets by mouth daily at 12 noon. Uses Gummies.         . raltegravir (ISENTRESS) 400 MG tablet   Oral   Take 400 mg by mouth 2 (two) times daily.         . ritonavir (NORVIR) 100 MG capsule   Oral   Take 100 mg by mouth daily.         . traMADol (ULTRAM) 50 MG tablet   Oral   Take 50-100 mg by mouth every 6 (six) hours as needed for pain.         . valACYclovir (VALTREX) 500 MG tablet   Oral   Take 500 mg by mouth 2 (two) times daily.         . Vitamin D, Ergocalciferol, (DRISDOL) 50000 UNITS CAPS   Oral   Take 50,000 Units by mouth every 7 (seven) days. tUES         . ziprasidone (GEODON) 40 MG capsule   Oral   Take 40 mg by mouth at bedtime.            BP 122/74  Pulse 83  Temp(Src) 98.9 F (37.2 C) (Oral)  Resp 18  SpO2 100%  LMP 12/04/2012  Physical Exam  Nursing note and vitals reviewed. Constitutional: She appears well-developed and well-nourished. No distress.  HENT:  Head: Normocephalic and atraumatic.  Eyes: Conjunctivae are normal. Right eye exhibits no discharge. Left eye exhibits no discharge.  Neck: Neck supple.  Cardiovascular: Normal rate, regular rhythm and normal heart sounds.  Exam reveals no gallop and no friction rub.   No murmur heard. Pulmonary/Chest: Effort normal and breath sounds normal. No respiratory distress.  Abdominal: Soft. She exhibits no distension. There is no tenderness.  Mild incisional tenderness which seems appropriate for recent surgery. Inferior aspect of the incision patient has a less than dime-sized area of granulation tissue. No drainage. No surrounding cellulitic changes.  Musculoskeletal: She exhibits no edema and no tenderness.  Neurological: She is alert.  Skin: Skin is warm and dry.  Psychiatric: Her behavior is normal. Thought content normal.  Anxious    ED Course   Procedures (including critical care time)  Labs Reviewed  COMPREHENSIVE METABOLIC PANEL - Abnormal; Notable for the following:    Glucose,  Bld 112 (*)    Albumin 3.4 (*)    GFR calc non Af Amer 76 (*)    GFR calc Af Amer 89 (*)    All other components within normal limits  CBC WITH DIFFERENTIAL - Abnormal; Notable for the following:    WBC 10.8 (*)    All other components within normal limits  URINALYSIS, ROUTINE W REFLEX MICROSCOPIC - Abnormal; Notable for the following:    APPearance CLOUDY (*)    All other components within normal limits   No results found.   1. Diarrhea       MDM  31yF with diarrhea. Mild tenderness at surgical site, but seems appropriate given recent surgery. W/u unremarkable. Possible viral illness. Patient has some concerns about her surgical incision. She has a very small area of granulation tissue. This appears to be healing well. No concerning drainage or other signs of infection. Continued wound care was discussed. Reassurance given. Outpatient surgical followup.         Raeford Razor, MD 01/01/13 [phone]

## 2013-01-02 ENCOUNTER — Encounter (INDEPENDENT_AMBULATORY_CARE_PROVIDER_SITE_OTHER): Payer: Medicaid Other | Admitting: General Surgery

## 2013-01-03 ENCOUNTER — Encounter (INDEPENDENT_AMBULATORY_CARE_PROVIDER_SITE_OTHER): Payer: Medicaid Other | Admitting: General Surgery

## 2013-01-03 ENCOUNTER — Telehealth (INDEPENDENT_AMBULATORY_CARE_PROVIDER_SITE_OTHER): Payer: Self-pay

## 2013-01-03 ENCOUNTER — Encounter (INDEPENDENT_AMBULATORY_CARE_PROVIDER_SITE_OTHER): Payer: Medicaid Other | Admitting: Surgery

## 2013-01-03 NOTE — Telephone Encounter (Signed)
Pt called to cancel her appt this afternoon with Dr. Johna Sheriff.  She explained her ride cancelled.  She is unable to come for another appt until Friday.  I advised her to call our office first thing Friday morning to get on the Urgent Office schedule.

## 2013-01-09 ENCOUNTER — Telehealth: Payer: Self-pay | Admitting: *Deleted

## 2013-01-09 ENCOUNTER — Ambulatory Visit: Payer: Medicaid Other | Admitting: Internal Medicine

## 2013-01-09 NOTE — Telephone Encounter (Signed)
Called patient and left voice mail to call the clinic to reschedule her lab and MD appt, she no showed both. [name]

## 2013-01-18 ENCOUNTER — Other Ambulatory Visit: Payer: Self-pay | Admitting: Internal Medicine

## 2013-02-12 ENCOUNTER — Encounter: Payer: Self-pay | Admitting: Obstetrics & Gynecology

## 2013-02-12 ENCOUNTER — Ambulatory Visit (INDEPENDENT_AMBULATORY_CARE_PROVIDER_SITE_OTHER): Payer: Medicaid Other | Admitting: Obstetrics & Gynecology

## 2013-02-12 VITALS — BP 131/83 | HR 71 | Temp 99.1°F | Ht 64.0 in | Wt 188.0 lb

## 2013-02-12 DIAGNOSIS — Z09 Encounter for follow-up examination after completed treatment for conditions other than malignant neoplasm: Secondary | ICD-10-CM

## 2013-02-12 DIAGNOSIS — N760 Acute vaginitis: Secondary | ICD-10-CM

## 2013-02-12 DIAGNOSIS — Z3202 Encounter for pregnancy test, result negative: Secondary | ICD-10-CM

## 2013-02-12 DIAGNOSIS — N926 Irregular menstruation, unspecified: Secondary | ICD-10-CM

## 2013-02-12 LAB — POCT URINE PREGNANCY: Preg Test, Ur: NEGATIVE

## 2013-02-12 LAB — POCT WET PREP (WET MOUNT): Clue Cells Wet Prep Whiff POC: NEGATIVE

## 2013-02-12 MED ORDER — METRONIDAZOLE 500 MG PO TABS: 500.0000 mg | ORAL_TABLET | Freq: Two times a day (BID) | ORAL | Status: DC

## 2013-02-12 NOTE — Patient Instructions (Signed)
Levonorgestrel intrauterine device (IUD) What is this medicine? LEVONORGESTREL IUD (LEE voe nor jes trel) is a cVaginitis Vaginitis is an inflammation of the vagina. It is most often caused by a change in the normal balance of the bacteria and yeast that live in the vagina. This change in balance causes an overgrowth of certain bacteria or yeast, which causes the inflammation. There are different types of vaginitis, but the most common types are:  Bacterial vaginosis.  Yeast infection (candidiasis).  Trichomoniasis vaginitis. This is a sexually transmitted infection (STI).  Viral vaginitis.  Atropic vaginitis.  Allergic vaginitis. CAUSES  The cause depends on the type of vaginitis. Vaginitis can be caused by:  Bacteria (bacterial vaginosis).  Yeast (yeast infection).  A parasite (trichomoniasis vaginitis)  A virus (viral vaginitis).  Low hormone levels (atrophic vaginitis). Low hormone levels can occur during pregnancy, breastfeeding, or after menopause.  Irritants, such as bubble baths, scented tampons, and feminine sprays (allergic vaginitis). Other factors can change the normal balance of the yeast and bacteria that live in the vagina. These include:  Antibiotic medicines.  Poor hygiene.  Diaphragms, vaginal sponges, spermicides, birth control pills, and intrauterine devices (IUD).  Sexual intercourse.  Infection.  Uncontrolled diabetes.  A weakened immune system. SYMPTOMS  Symptoms can vary depending on the cause of the vaginitis. Common symptoms include:  Abnormal vaginal discharge.  The discharge is white, gray, or yellow with bacterial vaginosis.  The discharge is thick, white, and cheesy with a yeast infection.  The discharge is frothy and yellow or greenish with trichomoniasis.  A bad vaginal odor.  The odor is fishy with bacterial vaginosis.  Vaginal itching, pain, or swelling.  Painful intercourse.  Pain or burning when urinating. Sometimes,  there are no symptoms. TREATMENT  Treatment will vary depending on the type of infection.   Bacterial vaginosis and trichomoniasis are often treated with antibiotic creams or pills.  Yeast infections are often treated with antifungal medicines, such as vaginal creams or suppositories.  Viral vaginitis has no cure, but symptoms can be treated with medicines that relieve discomfort. Your sexual partner should be treated as well.  Atrophic vaginitis may be treated with an estrogen cream, pill, suppository, or vaginal ring. If vaginal dryness occurs, lubricants and moisturizing creams may help. You may be told to avoid scented soaps, sprays, or douches.  Allergic vaginitis treatment involves quitting the use of the product that is causing the problem. Vaginal creams can be used to treat the symptoms. HOME CARE INSTRUCTIONS   Take all medicines as directed by your caregiver.  Keep your genital area clean and dry. Avoid soap and only rinse the area with water.  Avoid douching. It can remove the healthy bacteria in the vagina.  Do not use tampons or have sexual intercourse until your vaginitis has been treated. Use sanitary pads while you have vaginitis.  Wipe from front to back. This avoids the spread of bacteria from the rectum to the vagina.  Let air reach your genital area.  Wear cotton underwear to decrease moisture buildup.  Avoid wearing underwear while you sleep until your vaginitis is gone.  Avoid tight pants and underwear or nylons without a cotton panel.  Take off wet clothing (especially bathing suits) as soon as possible.  Use mild, non-scented products. Avoid using irritants, such as:  Scented feminine sprays.  Fabric softeners.  Scented detergents.  Scented tampons.  Scented soaps or bubble baths.  Practice safe sex and use condoms. Condoms may prevent the spread  of trichomoniasis and viral vaginitis. SEEK MEDICAL CARE IF:   You have abdominal pain.  You  have a fever or persistent symptoms for more than 2 3 days.  You have a fever and your symptoms suddenly get worse. Document Released: 05/30/2007 Document Revised: 04/26/2012 Document Reviewed: 01/13/2012 [facility] Patient Information 2014 [place], [place]. Etonogestrel implant What is this medicine? ETONOGESTREL is a contraceptive (birth control) device. It is used to prevent pregnancy. It can be used for up to 3 years. This medicine may be used for other purposes; ask your health care provider or pharmacist if you have questions. What should I tell my health care provider before I take this medicine? They need to know if you have any of these conditions: -abnormal vaginal bleeding -blood vessel disease or blood clots -cancer of the breast, cervix, or liver -depression -diabetes -gallbladder disease -headaches -heart disease or recent heart attack -high blood pressure -high cholesterol -kidney disease -liver disease -renal disease -seizures -tobacco smoker -an unusual or allergic reaction to etonogestrel, other hormones, anesthetics or antiseptics, medicines, foods, dyes, or preservatives -pregnant or trying to get pregnant -breast-feeding How should I use this medicine? This device is inserted just under the skin on the inner side of your upper arm by a health care professional. Talk to your pediatrician regarding the use of this medicine in children. Special care may be needed. Overdosage: If you think you've taken too much of this medicine contact a poison control center or emergency room at once. Overdosage: If you think you have taken too much of this medicine contact a poison control center or emergency room at once. NOTE: This medicine is only for you. Do not share this medicine with others. What if I miss a dose? This does not apply. What may interact with this medicine? Do not take this medicine with any of the following  medications: -amprenavir -bosentan -fosamprenavir This medicine may also interact with the following medications: -barbiturate medicines for inducing sleep or treating seizures -certain medicines for fungal infections like ketoconazole and itraconazole -griseofulvin -medicines to treat seizures like carbamazepine, felbamate, oxcarbazepine, phenytoin, topiramate -modafinil -phenylbutazone -rifampin -some medicines to treat HIV infection like atazanavir, indinavir, lopinavir, nelfinavir, tipranavir, ritonavir -St. John's wort This list may not describe all possible interactions. Give your health care provider a list of all the medicines, herbs, non-prescription drugs, or dietary supplements you use. Also tell them if you smoke, drink alcohol, or use illegal drugs. Some items may interact with your medicine. What should I watch for while using this medicine? This product does not protect you against HIV infection (AIDS) or other sexually transmitted diseases. You should be able to feel the implant by pressing your fingertips over the skin where it was inserted. Tell your doctor if you cannot feel the implant. What side effects may I notice from receiving this medicine? Side effects that you should report to your doctor or health care professional as soon as possible: -allergic reactions like skin rash, itching or hives, swelling of the face, lips, or tongue -breast lumps -changes in vision -confusion, trouble speaking or understanding -dark urine -depressed mood -general ill feeling or flu-like symptoms -light-colored stools -loss of appetite, nausea -right upper belly pain -severe headaches -severe pain, swelling, or tenderness in the abdomen -shortness of breath, chest pain, swelling in a leg -signs of pregnancy -sudden numbness or weakness of the face, arm or leg -trouble walking, dizziness, loss of balance or coordination -unusual vaginal bleeding, discharge -unusually weak or  tired -yellowing  of the eyes or skin Side effects that usually do not require medical attention (Report these to your doctor or health care professional if they continue or are bothersome.): -acne -breast pain -changes in weight -cough -fever or chills -headache -irregular menstrual bleeding -itching, burning, and vaginal discharge -pain or difficulty passing urine -sore throat This list may not describe all possible side effects. Call your doctor for medical advice about side effects. You may report side effects to FDA at 1-800-FDA-1088. Where should I keep my medicine? This drug is given in a hospital or clinic and will not be stored at home. NOTE: This sheet is a summary. It may not cover all possible information. If you have questions about this medicine, talk to your doctor, pharmacist, or health care provider.  2013, [employer] Standard. (04/25/2009 3:54:17 PM)  -endometritis -genital or pelvic infection now or in the past -have more than one sexual partner or your partner has more than one partner -heart disease -history of an ectopic or tubal pregnancy -immune system problems -IUD in place -liver disease or tumor -problems with blood clots or take blood-thinners -use intravenous drugs -uterus of unusual shape -vaginal bleeding that has not been explained -an unusual or allergic reaction to levonorgestrel, other hormones, silicone, or polyethylene, medicines, foods, dyes, or preservatives -pregnant or trying to get pregnant -breast-feeding How should I use this medicine? This device is placed inside the uterus by a health care professional. Talk to your pediatrician regarding the use of this medicine in children. Special care may be needed. Overdosage: If you think you have taken too much of this medicine contact a poison control center or emergency room at once. NOTE: This medicine is only for you. Do not share this medicine with others. What if I miss a dose? This  does not apply. What may interact with this medicine? Do not take this medicine with any of the following medications: -amprenavir -bosentan -fosamprenavir This medicine may also interact with the following medications: -aprepitant -barbiturate medicines for inducing sleep or treating seizures -bexarotene -griseofulvin -medicines to treat seizures like carbamazepine, ethotoin, felbamate, oxcarbazepine, phenytoin, topiramate -modafinil -pioglitazone -rifabutin -rifampin -rifapentine -some medicines to treat HIV infection like atazanavir, indinavir, lopinavir, nelfinavir, tipranavir, ritonavir -St. John's wort -warfarin This list may not describe all possible interactions. Give your health care provider a list of all the medicines, herbs, non-prescription drugs, or dietary supplements you use. Also tell them if you smoke, drink alcohol, or use illegal drugs. Some items may interact with your medicine. What should I watch for while using this medicine? Visit your doctor or health care professional for regular check ups. See your doctor if you or your partner has sexual contact with others, becomes HIV positive, or gets a sexual transmitted disease. This product does not protect you against HIV infection (AIDS) or other sexually transmitted diseases. You can check the placement of the IUD yourself by reaching up to the top of your vagina with clean fingers to feel the threads. Do not pull on the threads. It is a good habit to check placement after each menstrual period. Call your doctor right away if you feel more of the IUD than just the threads or if you cannot feel the threads at all. The IUD may come out by itself. You may become pregnant if the device comes out. If you notice that the IUD has come out use a backup birth control method like condoms and call your health care provider. Using tampons will not change the  position of the IUD and are okay to use during your period. What side  effects may I notice from receiving this medicine? Side effects that you should report to your doctor or health care professional as soon as possible: -allergic reactions like skin rash, itching or hives, swelling of the face, lips, or tongue -fever, flu-like symptoms -genital sores -high blood pressure -no menstrual period for 6 weeks during use -pain, swelling, warmth in the leg -pelvic pain or tenderness -severe or sudden headache -signs of pregnancy -stomach cramping -sudden shortness of breath -trouble with balance, talking, or walking -unusual vaginal bleeding, discharge -yellowing of the eyes or skin Side effects that usually do not require medical attention (report to your doctor or health care professional if they continue or are bothersome): -acne -breast pain -change in sex drive or performance -changes in weight -cramping, dizziness, or faintness while the device is being inserted -headache -irregular menstrual bleeding within first 3 to 6 months of use -nausea This list may not describe all possible side effects. Call your doctor for medical advice about side effects. You may report side effects to FDA at 1-800-FDA-1088. Where should I keep my medicine? This does not apply. NOTE: This sheet is a summary. It may not cover all possible information. If you have questions about this medicine, talk to your doctor, pharmacist, or health care provider.  2013, [employer] Standard. (09/02/2011 1:54:04 PM) Irritable Bowel Syndrome Irritable Bowel Syndrome (IBS) is caused by a disturbance of normal bowel function. Other terms used are spastic colon, mucous colitis, and irritable colon. It does not require surgery, nor does it lead to cancer. There is no cure for IBS. But with proper diet, stress reduction, and medication, you will find that your problems (symptoms) will gradually disappear or improve. IBS is a common digestive disorder. It usually appears in late adolescence or  early adulthood. Women develop it twice as often as men. CAUSES  After food has been digested and absorbed in the small intestine, waste material is moved into the colon (large intestine). In the colon, water and salts are absorbed from the undigested products coming from the small intestine. The remaining residue, or fecal material, is held for elimination. Under normal circumstances, gentle, rhythmic contractions on the bowel walls push the fecal material along the colon towards the rectum. In IBS, however, these contractions are irregular and poorly coordinated. The fecal material is either retained too long, resulting in constipation, or expelled too soon, producing diarrhea. SYMPTOMS  The most common symptom of IBS is pain. It is typically in the lower left side of the belly (abdomen). But it may occur anywhere in the abdomen. It can be felt as heartburn, backache, or even as a dull pain in the arms or shoulders. The pain comes from excessive bowel-muscle spasms and from the buildup of gas and fecal material in the colon. This pain:  Can range from sharp belly (abdominal) cramps to a dull, continuous ache.  Usually worsens soon after eating.  Is typically relieved by having a bowel movement or passing gas. Abdominal pain is usually accompanied by constipation. But it may also produce diarrhea. The diarrhea typically occurs right after a meal or upon arising in the morning. The stools are typically soft and watery. They are often flecked with secretions (mucus). Other symptoms of IBS include:  Bloating.  Loss of appetite.  Heartburn.  Feeling sick to your stomach (nausea).  Belching  Vomiting  Gas. IBS may also cause a number of symptoms  that are unrelated to the digestive system:  Fatigue.  Headaches.  Anxiety  Shortness of breath  Difficulty in concentrating.  Dizziness. These symptoms tend to come and go. DIAGNOSIS  The symptoms of IBS closely mimic the symptoms of  other, more serious digestive disorders. So your caregiver may wish to perform a variety of additional tests to exclude these disorders. He/she wants to be certain of learning what is wrong (diagnosis). The nature and purpose of each test will be explained to you. TREATMENT A number of medications are available to help correct bowel function and/or relieve bowel spasms and abdominal pain. Among the drugs available are:  Mild, non-irritating laxatives for severe constipation and to help restore normal bowel habits.  Specific anti-diarrheal medications to treat severe or prolonged diarrhea.  Anti-spasmodic agents to relieve intestinal cramps.  Your caregiver may also decide to treat you with a mild tranquilizer or sedative during unusually stressful periods in your life. The important thing to remember is that if any drug is prescribed for you, make sure that you take it exactly as directed. Make sure that your caregiver knows how well it worked for you. HOME CARE INSTRUCTIONS   Avoid foods that are high in fat or oils. Some examples YQM:VHQIO cream, butter, frankfurters, sausage, and other fatty meats.  Avoid foods that have a laxative effect, such as fruit, fruit juice, and dairy products.  Cut out carbonated drinks, chewing gum, and "gassy" foods, such as beans and cabbage. This may help relieve bloating and belching.  Bran taken with plenty of liquids may help relieve constipation.  Keep track of what foods seem to trigger your symptoms.  Avoid emotionally charged situations or circumstances that produce anxiety.  Start or continue exercising.  Get plenty of rest and sleep. MAKE SURE YOU:   Understand these instructions.  Will watch your condition.  Will get help right away if you are not doing well or get worse. Document Released: 08/02/2005 Document Revised: 10/25/2011 Document Reviewed: 03/22/2008 [facility] Patient Information 2014 [place], [place]. Exercise to Lose  Weight Exercise and a healthy diet may help you lose weight. Your doctor may suggest specific exercises. EXERCISE IDEAS AND TIPS  Choose low-cost things you enjoy doing, such as walking, bicycling, or exercising to workout videos.  Take stairs instead of the elevator.  Walk during your lunch break.  Park your car further away from work or school.  Go to a gym or an exercise class.  Start with 5 to 10 minutes of exercise each day. Build up to 30 minutes of exercise 4 to 6 days a week.  Wear shoes with good support and comfortable clothes.  Stretch before and after working out.  Work out until you breathe harder and your heart beats faster.  Drink extra water when you exercise.  Do not do so much that you hurt yourself, feel dizzy, or get very short of breath. Exercises that burn about 150 calories:  Running 1  miles in 15 minutes.  Playing volleyball for 45 to 60 minutes.  Washing and waxing a car for 45 to 60 minutes.  Playing touch football for 45 minutes.  Walking 1  miles in 35 minutes.  Pushing a stroller 1  miles in 30 minutes.  Playing basketball for 30 minutes.  Raking leaves for 30 minutes.  Bicycling 5 miles in 30 minutes.  Walking 2 miles in 30 minutes.  Dancing for 30 minutes.  Shoveling snow for 15 minutes.  Swimming laps for 20 minutes.  Walking up stairs for 15 minutes.  Bicycling 4 miles in 15 minutes.  Gardening for 30 to 45 minutes.  Jumping rope for 15 minutes.  Washing windows or floors for 45 to 60 minutes. Document Released: 09/04/2010 Document Revised: 10/25/2011 Document Reviewed: 09/04/2010 [facility] Patient Information 2014 [place], [place]. Calorie Counting Diet A calorie counting diet requires you to eat the number of calories that are right for you in a day. Calories are the measurement of how much energy you get from the food you eat. Eating the right amount of calories is important for staying at a healthy weight. If you  eat too many calories, your body will store them as fat and you may gain weight. If you eat too few calories, you may lose weight. Counting the number of calories you eat during a day will help you know if you are eating the right amount. A Registered Dietitian can determine how many calories you need in a day. The amount of calories needed varies from person to person. If your goal is to lose weight, you will need to eat fewer calories. Losing weight can benefit you if you are overweight or have health problems such as heart disease, high blood pressure, or diabetes. If your goal is to gain weight, you will need to eat more calories. Gaining weight may be necessary if you have a certain health problem that causes your body to need more energy. TIPS Whether you are increasing or decreasing the number of calories you eat during a day, it may be hard to get used to changes in what you eat and drink. The following are tips to help you keep track of the number of calories you eat.  Measure foods at home with measuring cups. This helps you know the amount of food and number of calories you are eating.  Restaurants often serve food in amounts that are larger than 1 serving. While eating out, estimate how many servings of a food you are given. For example, a serving of cooked rice is  cup or about the size of half of a fist. Knowing serving sizes will help you be aware of how much food you are eating at restaurants.  Ask for smaller portion sizes or child-size portions at restaurants.  Plan to eat half of a meal at a restaurant. Take the rest home or share the other half with a friend.  Read the Nutrition Facts panel on food labels for calorie content and serving size. You can find out how many servings are in a package, the size of a serving, and the number of calories each serving has.  For example, a package might contain 3 cookies. The Nutrition Facts panel on that package says that 1 serving is 1  cookie. Below that, it will say there are 3 servings in the container. The calories section of the Nutrition Facts label says there are 90 calories. This means there are 90 calories in 1 cookie (1 serving). If you eat 1 cookie you have eaten 90 calories. If you eat all 3 cookies, you have eaten 270 calories (3 servings x 90 calories = 270 calories). The list below tells you how big or small some common portion sizes are.  1 oz.........4 stacked dice.  3 oz........Marland KitchenDeck of cards.  1 tsp.......Marland KitchenTip of little finger.  1 tbs......Marland KitchenMarland KitchenThumb.  2 tbs.......Marland KitchenGolf ball.   cup......Marland KitchenHalf of a fist.  1 cup.......Marland KitchenA fist. KEEP A FOOD LOG Write down every food item you eat, the amount  you eat, and the number of calories in each food you eat during the day. At the end of the day, you can add up the total number of calories you have eaten. It may help to keep a list like the one below. Find out the calorie information by reading the Nutrition Facts panel on food labels. Breakfast  Bran cereal (1 cup, 110 calories).  Fat-free milk ( cup, 45 calories). Snack  Apple (1 medium, 80 calories). Lunch  Spinach (1 cup, 20 calories).  Tomato ( medium, 20 calories).  Chicken breast strips (3 oz, 165 calories).  Shredded cheddar cheese ( cup, 110 calories).  Light [place] dressing (2 tbs, 60 calories).  Whole-wheat bread (1 slice, 80 calories).  Tub margarine (1 tsp, 35 calories).  Vegetable soup (1 cup, 160 calories). Dinner  Pork chop (3 oz, 190 calories).  Brown rice (1 cup, 215 calories).  Steamed broccoli ( cup, 20 calories).  Strawberries (1  cup, 65 calories).  Whipped cream (1 tbs, 50 calories). Daily Calorie Total: 1425 Document Released: 08/02/2005 Document Revised: 10/25/2011 Document Reviewed: 01/27/2007 [facility] Patient Information 2014 [place], [place].

## 2013-02-12 NOTE — Progress Notes (Signed)
Subjective:     [name] is a 32 y.o. female here for a routine exam.  Current complaints: Patient had surgery on 12/04/2012 and reports she had prolonged bleeding after the surgery from 12/08/2012 until 01/16/2013. Patient reports she has had a problem with weight gain since the surgery as high as 200lb. Patient c/o problems with abdominal pain and bloating.  Personal health questionnaire reviewed: no.   Gynecologic History Patient's last menstrual period was 12/08/2012. Contraception: Depo-Provera injections Last Pap: 01/2012. Results were: abnormal   Obstetric History OB History   Grav Para Term Preterm Abortions TAB SAB Ect Mult Living                   The following portions of the patient's history were reviewed and updated as appropriate: allergies, current medications, past family history, past medical history, past social history, past surgical history and problem list.  Review of Systems Pertinent items are noted in HPI.    Objective:   General:  alert     Abdomen: soft, non-tender; bowel sounds normal; no masses,  no organomegaly   Vulva:  normal  Vagina: normal vagina  Cervix:  no lesions  Corpus: normal size, contour, position, consistency, mobility, non-tender  Adnexa:  normal adnexa    Assessment:    Doing well postoperatively ?IBS AUB--?stress-related  ?Vaginitis--possible BV Plan:    Monitor menses Metronidazole empircally Referral-->Nutritionist, GI Return prn

## 2013-02-13 ENCOUNTER — Encounter: Payer: Self-pay | Admitting: Obstetrics & Gynecology

## 2013-02-13 LAB — WET PREP BY MOLECULAR PROBE
Candida species: NEGATIVE
Gardnerella vaginalis: POSITIVE — AB
Trichomonas vaginosis: NEGATIVE

## 2013-02-13 LAB — GC/CHLAMYDIA PROBE AMP
CT Probe RNA: NEGATIVE
GC Probe RNA: NEGATIVE

## 2013-02-19 ENCOUNTER — Other Ambulatory Visit: Payer: Self-pay | Admitting: Internal Medicine

## 2013-02-26 ENCOUNTER — Telehealth: Payer: Self-pay | Admitting: *Deleted

## 2013-02-26 MED ORDER — FLUCONAZOLE 150 MG PO TABS: 150.0000 mg | ORAL_TABLET | Freq: Once | ORAL | Status: DC

## 2013-02-26 NOTE — Telephone Encounter (Signed)
Patient called regarding discharge and vaginal itching.  Patient recently finished her antibiotic for BV.  Diflucan sent to pharmacy per nursing protocol.

## 2013-02-28 ENCOUNTER — Encounter: Payer: Self-pay | Admitting: Internal Medicine

## 2013-03-01 ENCOUNTER — Other Ambulatory Visit (INDEPENDENT_AMBULATORY_CARE_PROVIDER_SITE_OTHER): Payer: Medicaid Other

## 2013-03-01 DIAGNOSIS — B20 Human immunodeficiency virus [HIV] disease: Secondary | ICD-10-CM

## 2013-03-01 LAB — COMPLETE METABOLIC PANEL WITH GFR
AST: 16 U/L (ref 0–37)
Alkaline Phosphatase: 78 U/L (ref 39–117)
BUN: 12 mg/dL (ref 6–23)
GFR, Est Non African American: 61 mL/min
Glucose, Bld: 93 mg/dL (ref 70–99)
Sodium: 139 mEq/L (ref 135–145)
Total Bilirubin: 0.4 mg/dL (ref 0.3–1.2)

## 2013-03-01 LAB — CBC WITH DIFFERENTIAL/PLATELET
Basophils Absolute: 0 10*3/uL (ref 0.0–0.1)
Basophils Relative: 0 % (ref 0–1)
Eosinophils Absolute: 0.1 10*3/uL (ref 0.0–0.7)
Hemoglobin: 14.7 g/dL (ref 12.0–15.0)
MCH: 32.2 pg (ref 26.0–34.0)
MCHC: 34.7 g/dL (ref 30.0–36.0)
Monocytes Absolute: 0.4 10*3/uL (ref 0.1–1.0)
Monocytes Relative: 6 % (ref 3–12)
Neutrophils Relative %: 55 % (ref 43–77)
RDW: 13.3 % (ref 11.5–15.5)

## 2013-03-02 LAB — HIV-1 RNA QUANT-NO REFLEX-BLD: HIV 1 RNA Quant: 32 copies/mL — ABNORMAL HIGH (ref <20)

## 2013-03-02 LAB — T-HELPER CELL (CD4) - (RCID CLINIC ONLY)
CD4 % Helper T Cell: 24 % — ABNORMAL LOW (ref 33–55)
CD4 T Cell Abs: 720 uL (ref 400–2700)

## 2013-03-05 ENCOUNTER — Other Ambulatory Visit: Payer: Self-pay | Admitting: *Deleted

## 2013-03-05 MED ORDER — FLUCONAZOLE 150 MG PO TABS: 150.0000 mg | ORAL_TABLET | ORAL | Status: DC

## 2013-03-05 NOTE — Telephone Encounter (Signed)
Patient states that she recently took Flagyl for BV and now has a yeast infection.  She was given one dose of diflucan last week and states that it did not clear it up.  Patient is requesting a refill.

## 2013-03-07 ENCOUNTER — Ambulatory Visit: Payer: Medicaid Other | Admitting: Obstetrics & Gynecology

## 2013-03-15 ENCOUNTER — Ambulatory Visit: Payer: Medicaid Other | Admitting: Internal Medicine

## 2013-03-15 ENCOUNTER — Encounter: Payer: Self-pay | Admitting: Internal Medicine

## 2013-03-20 ENCOUNTER — Ambulatory Visit: Payer: Medicaid Other | Admitting: Internal Medicine

## 2013-03-21 ENCOUNTER — Other Ambulatory Visit: Payer: Self-pay | Admitting: Internal Medicine

## 2013-04-05 ENCOUNTER — Encounter: Payer: Self-pay | Admitting: Internal Medicine

## 2013-04-05 ENCOUNTER — Ambulatory Visit (INDEPENDENT_AMBULATORY_CARE_PROVIDER_SITE_OTHER): Payer: Medicaid Other | Admitting: Internal Medicine

## 2013-04-05 VITALS — BP 121/85 | HR 91 | Temp 98.3°F | Wt 186.0 lb

## 2013-04-05 DIAGNOSIS — Z79899 Other long term (current) drug therapy: Secondary | ICD-10-CM

## 2013-04-05 DIAGNOSIS — F319 Bipolar disorder, unspecified: Secondary | ICD-10-CM

## 2013-04-05 DIAGNOSIS — Z113 Encounter for screening for infections with a predominantly sexual mode of transmission: Secondary | ICD-10-CM

## 2013-04-05 DIAGNOSIS — B20 Human immunodeficiency virus [HIV] disease: Secondary | ICD-10-CM

## 2013-04-05 NOTE — Assessment & Plan Note (Signed)
He is doing very well on herself his regimen and denies any missed doses. She will continue with this regimen and followup in 3 months.

## 2013-04-05 NOTE — Assessment & Plan Note (Signed)
She is on Geodon however was taking it with an empty stomach and therefore she was counseled on taking it with food and have better absorption

## 2013-04-05 NOTE — Progress Notes (Signed)
  Subjective:    Patient ID: [name], female    DOB: 1981/03/10, 32 y.o.   MRN: [id number]  HPI She comes in for followup of her HIV. She has a highly resistant virus and is on a salvage regimen of tivicay, Prezista, Norvir and Truvada. She continues to have excellent compliance and has had no new complaints related to her HIV medicines. Her viral load remains well suppressed with just a little over 30 copy is. She has no problems with weight loss, diarrhea or rashes. She recently has had a hysterectomy with some complications.    Review of Systems  Constitutional: Negative for fever and fatigue.  HENT: Negative for sore throat and trouble swallowing.   Eyes: Negative for visual disturbance.  Respiratory: Negative for cough and shortness of breath.   Cardiovascular: Negative for chest pain and leg swelling.  Gastrointestinal: Negative for nausea and diarrhea.  Musculoskeletal: Negative for myalgias and arthralgias.  Skin: Negative for rash.  Neurological: Negative for dizziness, light-headedness and headaches.  Hematological: Negative for adenopathy.  Psychiatric/Behavioral: Negative for dysphoric mood.       Objective:   Physical Exam  Constitutional: She is oriented to person, place, and time. She appears well-developed and well-nourished. No distress.  HENT:  Mouth/Throat: No oropharyngeal exudate.  Eyes: Right eye exhibits no discharge. Left eye exhibits no discharge. No scleral icterus.  Cardiovascular: Normal rate, regular rhythm and normal heart sounds.   No murmur heard. Pulmonary/Chest: Breath sounds normal. No respiratory distress.  Lymphadenopathy:    She has no cervical adenopathy.  Neurological: She is alert and oriented to person, place, and time.  Skin: Rash noted.  She has a small pinpoint rash on her right forehead  Psychiatric: She has a normal mood and affect. Her behavior is normal.          Assessment & Plan:

## 2013-04-09 ENCOUNTER — Encounter: Payer: Self-pay | Admitting: Dietician

## 2013-04-09 ENCOUNTER — Encounter: Payer: Medicaid Other | Attending: Obstetrics & Gynecology | Admitting: Dietician

## 2013-04-09 VITALS — Ht 64.0 in | Wt 186.4 lb

## 2013-04-09 DIAGNOSIS — Z713 Dietary counseling and surveillance: Secondary | ICD-10-CM | POA: Insufficient documentation

## 2013-04-09 DIAGNOSIS — R635 Abnormal weight gain: Secondary | ICD-10-CM | POA: Insufficient documentation

## 2013-04-09 DIAGNOSIS — E119 Type 2 diabetes mellitus without complications: Secondary | ICD-10-CM

## 2013-04-09 DIAGNOSIS — K219 Gastro-esophageal reflux disease without esophagitis: Secondary | ICD-10-CM | POA: Insufficient documentation

## 2013-04-09 NOTE — Progress Notes (Signed)
Medical Nutrition Therapy:  Appt start time: 1100 end time:  1200. Assessment:  Primary concerns today: weight gain post surgery on April 21st. Pt states gain from usual body weight of 160 lbs. To 198 lbs. She was essentially unable to move due to allergic reaction after the surgery.  MEDICATIONS: see list.  DIETARY INTAKE: Usual eating pattern includes 1 meals and 1-3 snacks per day. Everyday foods include fruit cup, granola bar, chips, cakes, cookies.  Avoided foods include none noted.   24-hr recall:  B ( AM): fruit cup, water. Sometimes a granola bar. Often doesn't have a good appetite for breakfast.  Snk ( AM): twizzlers, fruit cup, granola bar. Not consistent with eating snacks or lunch.  L ( PM): usually no lunch. Snk ( PM): bag of chips D ( PM): starch, veg, meat. Rice, chix, corn, and lima beans are common options.  Snk ( PM): potato chips, cakes, cookies.  Beverages: water for most part, regular soda about 2 per day, coffee daily in cooler weather (decaf with vanilla creamer), no EtOH She also has some complications from surgery that seem to trigger diarrhea from regular food intake, and pt may go a few days without actually eating anything because of this.   Usual physical activity: walks a decent amount. She is forcing herself to walk to some appointments, other walking around town. No active hobbies or formal exercise.   Progress Towards Goal(s):  In progress.   Nutritional Diagnosis:  [place]-3.3 Overweight/obesity As related to irregular meal pattern, frequent intake of high kcal foods, low physical activity.  As evidenced by pt diet recall, recent wt gain.    Intervention:  Nutrition counseling provided regarding pathophysiology of type II DM, which foods contain carbs, best strategies to control carb intake and BG. RD also discussed with the patient the need to introduce more regular meal pattern, specifically more high protein foods and breakfast and lunch times. RD highlighted  the need to alter options for snack foods and reduce intake to improve DM control, regain regular meal pattern, and control kcal intake. Physical activity preferences discussed- she prefers walking. Goals: Ms. Ritsema will eat 3 meals each day. Her breakfast must have at least a protein and fiber food. Her lunch must have a nonstarchy vegetable and a protein food. Her dinner should follow the plate method:  nonstarchy vegetables,  protein, and  starch.  Ms. Koehn will choose more snacks rich in protein or fiber, like nuts/seeds, greek yogurt, and fruit. Ms. Prestage will walk 150 minutes per week, progressing by 25 minutes every other week until she reaches an hour per day.   Handouts given during visit include:  Living Well with Diabetes  Best Protein, Fat, and Carb foods  Monitoring/Evaluation:  Dietary intake, exercise, portion control, and body weight in 2 month(s).

## 2013-04-18 ENCOUNTER — Encounter: Payer: Self-pay | Admitting: Internal Medicine

## 2013-04-18 ENCOUNTER — Other Ambulatory Visit: Payer: Self-pay | Admitting: Internal Medicine

## 2013-04-19 ENCOUNTER — Ambulatory Visit (INDEPENDENT_AMBULATORY_CARE_PROVIDER_SITE_OTHER): Payer: Medicaid Other | Admitting: Internal Medicine

## 2013-04-19 ENCOUNTER — Other Ambulatory Visit: Payer: Self-pay | Admitting: Gastroenterology

## 2013-04-19 ENCOUNTER — Other Ambulatory Visit (INDEPENDENT_AMBULATORY_CARE_PROVIDER_SITE_OTHER): Payer: Medicaid Other

## 2013-04-19 ENCOUNTER — Encounter: Payer: Self-pay | Admitting: Internal Medicine

## 2013-04-19 ENCOUNTER — Telehealth: Payer: Self-pay | Admitting: Gastroenterology

## 2013-04-19 VITALS — BP 100/70 | HR 72 | Ht 63.25 in | Wt 186.2 lb

## 2013-04-19 DIAGNOSIS — K649 Unspecified hemorrhoids: Secondary | ICD-10-CM

## 2013-04-19 DIAGNOSIS — R194 Change in bowel habit: Secondary | ICD-10-CM

## 2013-04-19 DIAGNOSIS — S36503S Unspecified injury of sigmoid colon, sequela: Secondary | ICD-10-CM

## 2013-04-19 DIAGNOSIS — R140 Abdominal distension (gaseous): Secondary | ICD-10-CM

## 2013-04-19 DIAGNOSIS — R110 Nausea: Secondary | ICD-10-CM

## 2013-04-19 DIAGNOSIS — R198 Other specified symptoms and signs involving the digestive system and abdomen: Secondary | ICD-10-CM

## 2013-04-19 DIAGNOSIS — K625 Hemorrhage of anus and rectum: Secondary | ICD-10-CM

## 2013-04-19 DIAGNOSIS — R141 Gas pain: Secondary | ICD-10-CM

## 2013-04-19 DIAGNOSIS — R109 Unspecified abdominal pain: Secondary | ICD-10-CM

## 2013-04-19 DIAGNOSIS — B20 Human immunodeficiency virus [HIV] disease: Secondary | ICD-10-CM

## 2013-04-19 DIAGNOSIS — K219 Gastro-esophageal reflux disease without esophagitis: Secondary | ICD-10-CM

## 2013-04-19 LAB — CBC
Hemoglobin: 14.7 g/dL (ref 12.0–15.0)
MCV: 93.1 fl (ref 78.0–100.0)
Platelets: 301 10*3/uL (ref 150.0–400.0)
RBC: 4.59 Mil/uL (ref 3.87–5.11)

## 2013-04-19 LAB — BASIC METABOLIC PANEL
BUN: 16 mg/dL (ref 6–23)
Creatinine, Ser: 1.2 mg/dL (ref 0.4–1.2)
GFR: 67.22 mL/min (ref >60.00)
Glucose, Bld: 91 mg/dL (ref 70–99)
Potassium: 4.9 mEq/L (ref 3.5–5.1)

## 2013-04-19 LAB — URINALYSIS WITH CULTURE, IF INDICATED
Bilirubin Urine: NEGATIVE
Ketones, ur: NEGATIVE
Leukocytes, UA: NEGATIVE
Total Protein, Urine: NEGATIVE
pH: 6 (ref 5.0–8.0)

## 2013-04-19 MED ORDER — HYDROCORTISONE ACE-PRAMOXINE 1-1 % RE CREA: TOPICAL_CREAM | Freq: Two times a day (BID) | RECTAL | Status: DC

## 2013-04-19 MED ORDER — OMEPRAZOLE 40 MG PO CPDR
40.0000 mg | DELAYED_RELEASE_CAPSULE | Freq: Every day | ORAL | Status: DC
Start: 2013-04-19 — End: 2014-04-04

## 2013-04-19 MED ORDER — MOVIPREP 100 G PO SOLR: ORAL | Status: DC

## 2013-04-19 MED ORDER — ONDANSETRON 4 MG PO TBDP: 4.0000 mg | ORAL_TABLET | Freq: Three times a day (TID) | ORAL | Status: DC | PRN

## 2013-04-19 NOTE — Telephone Encounter (Signed)
Message copied by Richardo Hanks on Thu Apr 19, 2013  3:10 PM ------      Message from: Beverley Fiedler      Created: Thu Apr 19, 2013  1:48 PM       Please start patient on omeprazole 40 mg daily.      Dr. Luciana Axe is fine with this medication for her.      This is for her GERD and nausea.      It needs to be separated from her Tivicay by 12 hours (I would have her take omeprazole 40 mg once daily, 30 min before breakfast each day and then have her take Tivicay 12 hours later in the evening).      Thanks      JMP            ----- Message -----         From: Gardiner Barefoot, MD         Sent: 04/19/2013  12:39 PM           To: Beverley Fiedler, MD            A ppi is fine, just separate it from her Tivicay by 12 hours.              Thanks,            Rob comer      ----- Message -----         From: Beverley Fiedler, MD         Sent: 04/19/2013  11:50 AM           To: Gardiner Barefoot, MD            Dr. Luciana Axe, I saw Marlene Lard in clinic and would like to RX PPI.  Is this okay with her current HIV regimen.  I will hold off on the RX till I hear from you.  Thanks for your help.            Khara Renaud,       Please await Dr. Ephriam Knuckles recommendation      Also pt needs RX or analpram TID for hemorrhoids                   ------

## 2013-04-19 NOTE — Patient Instructions (Addendum)
You have been scheduled for a /Endoscopycolonoscopy with propofol. Please follow written instructions given to you at your visit today.  Please pick up your prep kit at the pharmacy within the next 1-3 days. If you use inhalers (even only as needed), please bring them with you on the day of your procedure. Your physician has requested that you go to www.startemmi.com and enter the access code given to you at your visit today. This web site gives a general overview about your procedure. However, you should still follow specific instructions given to you by our office regarding your preparation for the procedure.  Your physician has requested that you go to the basement for lab work before leaving today.  We have sent the following medications to your pharmacy for you to pick up at your convenience: Zofran, Moviprep   You have been scheduled for a CT scan of the abdomen and pelvis at [facility] ([address]---this is in the same building as Architectural technologist).   You are scheduled on 04/20/2013 at 9:00. You should arrive 15 minutes prior to your appointment time for registration. Please follow the written instructions below on the day of your exam:  WARNING: IF YOU ARE ALLERGIC TO IODINE/X-RAY DYE, PLEASE NOTIFY RADIOLOGY IMMEDIATELY AT [phone]! YOU WILL BE GIVEN A 13 HOUR PREMEDICATION PREP.  1) Do not eat or drink anything after 5:00am (4 hours prior to your test) 2) You have been given 2 bottles of oral contrast to drink. The solution may taste  better if refrigerated, but do NOT add ice or any other liquid to this solution. Shake well before drinking.    Drink 1 bottle of contrast @ 7:00 (2 hours prior to your exam)  Drink 1 bottle of contrast @ 8:00 (1 hour prior to your exam)  You may take any medications as prescribed with a small amount of water except for the following: Metformin, Glucophage, Glucovance, Avandamet, Riomet, Fortamet, Actoplus Met, Janumet, Glumetza or  Metaglip. The above medications must be held the day of the exam AND 48 hours after the exam.  The purpose of you drinking the oral contrast is to aid in the visualization of your intestinal tract. The contrast solution may cause some diarrhea. Before your exam is started, you will be given a small amount of fluid to drink. Depending on your individual set of symptoms, you may also receive an intravenous injection of x-ray contrast/dye. Plan on being at [facility] for 30 minutes or long, depending on the type of exam you are having performed.  If you have any questions regarding your exam or if you need to reschedule, you may call the CT department at [phone] between the hours of 8:00 am and 5:00 pm, Monday-Friday.  Dr. Rhea Belton would like you to start taking a probiotic daily, you can purchase this over the counter.  ________________________________________________________________________

## 2013-04-19 NOTE — Progress Notes (Signed)
Patient ID: [name], female   DOB: [birth year], 32 y.o.   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 Concepcion Elk for evaluation of change in bowel habits, diarrhea or constipation, rectal bleeding, nausea, and abdominal pain. The patient is alone today. She reports that most of her current trouble with her abdomen began after her ovarian surgery in April 2014. She reports is performed to remove a right ovarian teratoma and was complicated by sigmoid injury. The sigmoid colon injury to the serosal surface was recognized and repaired on the same day as her ovarian surgery.  Since this time she reports a change in her bowel habits including periods of constipation which can last for several days, and then at other times urgent diarrhea. Her loose stools tend to be postprandially when it is occurring. She reports pain behind her umbilicus and also feelings of gas and bloating. She reports she feels like "there is a blockage" which prevent stool from passing. She reports suprapubic pain and frequent urination, which is worse at night. She reports fluctuating appetite and also nausea. No vomiting. She does have heartburn occasionally uses over-the-counter Prilosec which helps. Prilosec also seems to help with her nausea. She denies dysphagia or diet dysphagia. She does report a history of external hemorrhoids which bleed with wiping after bowel movement. She's tried Preparation H without benefit. She still has burning and itching at times around her anus.  She denies fevers or chills. She denies melena.  Of note, she reports there was a partial wound dehiscence after surgery which was attempted to be closed by using Steri-Strips. She reports there was a local skin reaction which required removal of  Steri-Strips and wound was allowed to heal by secondary intention. Denies treatment with antibiotics around this time.  Past Medical History  Diagnosis Date  . HIV infection   . Anxiety   . Depression   . Asthma   . Diabetes mellitus without complication     no meds currently- controlled with diet  . GERD (gastroesophageal reflux disease)   . Neuromuscular disorder     carpal tunnel in both arms  . Human immunodeficiency virus (HIV) disease 12/07/2012  . Herpes genitalis in women 12/07/2012  . Bipolar disorder, unspecified 12/07/2012  . Anxiety state, unspecified 12/07/2012  . Type II or unspecified type diabetes mellitus without mention of complication, not stated as uncontrolled 12/07/2012  . Unspecified asthma(493.90) 12/07/2012  . GERD (gastroesophageal reflux disease) 12/07/2012    Past Surgical History  Procedure Laterality Date  . Cesarean section    . Robotic assisted bilateral salpingo oopherectomy Right 12/04/2012    Procedure: ROBOTIC ASSISTED Right  SALPINGO OOPHORECTOMY;  Surgeon: [name], MD;  Location: [facility];  Service: Gynecology;  Laterality: Right;  . Laparotomy N/A 12/04/2012    Procedure: EXPLORATORY LAPAROTOMY;  Surgeon: [name], MD;  Location: [facility];  Service: Gynecology;  Laterality: N/A;  repair of serosal injury  . Laparoscopic lysis of adhesions N/A 12/04/2012    Procedure: LAPAROSCOPIC LYSIS OF ADHESIONS;  Surgeon: [name], MD;  Location: [facility];  Service: Gynecology;  Laterality: N/A;    Current Outpatient Prescriptions  Medication Sig Dispense Refill  . acetaminophen (TYLENOL) 325 MG tablet Take 2 tablets (650 mg total) by mouth every 6 (six) hours as  needed for pain.      Marland Kitchen albuterol (PROVENTIL HFA;VENTOLIN HFA) 108 (90 BASE) MCG/ACT inhaler Inhale 2 puffs into the lungs every 6 (six) hours as needed for wheezing.      Marland Kitchen amitriptyline (ELAVIL) 25 MG tablet Take 25 mg by mouth at bedtime.      . beclomethasone (QVAR) 40  MCG/ACT inhaler Inhale 2 puffs into the lungs 2 (two) times daily.  1 Inhaler  5  . Darunavir Ethanolate (PREZISTA) 800 MG tablet Take 800 mg by mouth daily.      Roselie Awkward Sodium (TIVICAY) 50 MG TABS Take 50 mg by mouth daily.      . DULoxetine (CYMBALTA) 30 MG capsule Take 30 mg by mouth daily.      Marland Kitchen emtricitabine-tenofovir (TRUVADA) 200-300 MG per tablet Take 1 tablet by mouth 2 (two) times daily.      . flunisolide (NASAREL) 29 MCG/ACT (0.025%) nasal spray Place 2 sprays into the nose at bedtime. Dose is for each nostril.  25 mL  12  . lithium carbonate (ESKALITH) 450 MG CR tablet Take 450 mg by mouth. Take 1 tablet in the morning. Take 2 tablets at night.      . montelukast (SINGULAIR) 10 MG tablet Take 10 mg by mouth at bedtime.      . Prenatal Vit-Fe Fumarate-FA (MULTIVITAMIN-PRENATAL) 27-0.8 MG TABS Take 2 tablets by mouth daily at 12 noon. Uses Gummies.      . ritonavir (NORVIR) 100 MG TABS tablet Take 100 mg by mouth daily.      . SUMAtriptan (IMITREX) 100 MG tablet Take 100 mg by mouth every 2 (two) hours as needed for migraine.      . traMADol (ULTRAM) 50 MG tablet Take 50-100 mg by mouth every 6 (six) hours as needed for pain.      . TRUVADA 200-300 MG per tablet TAKE ONE TABLET   BY MOUTH   DAILY  30 tablet  0  . valACYclovir (VALTREX) 500 MG tablet Take 500 mg by mouth 2 (two) times daily.      . ziprasidone (GEODON) 40 MG capsule Take 40 mg by mouth at bedtime.       Marland Kitchen MOVIPREP 100 G SOLR Use per prep instruction  1 kit  0  . ondansetron (ZOFRAN ODT) 4 MG disintegrating tablet Take 1 tablet (4 mg total) by mouth every 8 (eight) hours as needed for nausea.  20 tablet  0   No current facility-administered medications for this visit.    Allergies  Allergen Reactions  . Tape Other (See Comments)    Blistering underneath Steri-Strip skin tapes.  Allergy to bezoin?  . Benzoin Other (See Comments)    blisters  . Latex Other (See Comments)    Only condoms cause irritation   . Sulfamethoxazole W-Trimethoprim Itching and Nausea And Vomiting    Family History  Problem Relation Age of Onset  . Arthritis/Rheumatoid Mother   . Hypertension Mother     History  Substance Use Topics  . Smoking status: Never Smoker   . Smokeless tobacco: Never Used  . Alcohol Use: No    ROS: As per history of present illness, otherwise negative  BP 100/70  Pulse 72  Ht 5' 3.25" (1.607 m)  Wt 186 lb 4 oz (84.482 kg)  BMI 32.71 kg/m2  LMP 04/01/2013 Constitutional: Well-developed and well-nourished. No distress. HEENT: Normocephalic and atraumatic. Oropharynx is clear and moist. No oropharyngeal exudate. Conjunctivae are normal.  No scleral icterus. Neck:  Neck supple. Trachea midline. Cardiovascular: Normal rate, regular rhythm and intact distal pulses. No M/R/G Pulmonary/chest: Effort normal and breath sounds normal. No wheezing, rales or rhonchi. Abdominal: Soft, mild epigastric and suprapubic tenderness without rebound or guarding, nondistended. Bowel sounds active throughout. There are no masses palpable. Well-healed abdominal scars. Extremities: no clubbing, cyanosis, or edema Lymphadenopathy: No cervical adenopathy noted. Neurological: Alert and oriented to person place and time. Skin: Skin is warm and dry. No rashes noted. Psychiatric: Normal mood and affect. Behavior is normal.  RELEVANT LABS AND IMAGING: CBC    Component Value Date/Time   WBC 7.7 03/01/2013 1148   RBC 4.56 03/01/2013 1148   HGB 14.7 03/01/2013 1148   HCT 42.4 03/01/2013 1148   PLT 300 03/01/2013 1148   MCV 93.0 03/01/2013 1148   MCH 32.2 03/01/2013 1148   MCHC 34.7 03/01/2013 1148   RDW 13.3 03/01/2013 1148   LYMPHSABS 2.9 03/01/2013 1148   MONOABS 0.4 03/01/2013 1148   EOSABS 0.1 03/01/2013 1148   BASOSABS 0.0 03/01/2013 1148    CMP     Component Value Date/Time   NA 139 03/01/2013 1148   K 4.6 03/01/2013 1148   CL 107 03/01/2013 1148   CO2 25 03/01/2013 1148   GLUCOSE 93 03/01/2013 1148    BUN 12 03/01/2013 1148   CREATININE 1.19* 03/01/2013 1148   CREATININE 0.97 12/28/2012 1752   CALCIUM 9.6 03/01/2013 1148   PROT 6.9 03/01/2013 1148   ALBUMIN 4.1 03/01/2013 1148   AST 16 03/01/2013 1148   ALT 14 03/01/2013 1148   ALKPHOS 78 03/01/2013 1148   BILITOT 0.4 03/01/2013 1148   GFRNONAA 76* 12/28/2012 1752   GFRAA 89* 12/28/2012 1752   Surgical office visit and GYN operative notes reviewed (sigmoid repair op note not found)  ASSESSMENT/PLAN: 32 yo female with PMH of HIV, DM2, GERD, bipolar disorder, GERD, anxiety, 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 Concepcion Elk for evaluation of change in bowel habits, diarrhea or constipation, rectal bleeding, nausea, and abdominal pain.   1.  Change in bowel habits/abd pain/diarrhea/constipation/hx of sigmoid colon injury --it seems that the majority of her complaints including abdominal pain, change in bowel habits which alternate between diarrhea and constipation occurred after her abdominal surgery in April. This raises the question of whether there is a luminal narrowing in the sigmoid colon leading to her current symptomatology. I recommend an abdominal CT scan of the abdomen and pelvis with contrast which will help evaluate the colon but also other abdominal organs to ensure there are no pockets of fluid or infection. I also would like for her to begin Benefiber daily along with a probiotic. If her CT scan is unrevealing, I recommended proceeding to colonoscopy to evaluate the colonic mucosa and also rule out any significant narrowings. Her rectal bleeding is felt to be secondary to hemorrhoids, but this is another reason to pursue colonoscopy. See #3.  2.  GERD/nausea -- she is having heartburn which is responsive to over-the-counter PPI. I have discussed with her how some acid suppressing medications can interfere with absorption of her HIV  medications. I feel that she is appropriate to begin a daily PPI regimen, but would like to first check with Dr. Luciana Axe to ensure no interaction with her anti-retroviral therapy.  If he is okay proceeding, I will prescribe omeprazole 40 mg daily. I will give her a prescription for Zofran  to be used as needed and as directed for nausea  3.  Hemorrhoids with rectal bleeding -- prescription for Analpram.  Will evaluate with colonoscopy as discussed in #1 after her upcoming CT scan  4.  HIV -- on antiretrovirals therapy with viral suppression a normal CD4 count (720 July 2014)

## 2013-04-19 NOTE — Telephone Encounter (Signed)
lvm for pt to call me back 

## 2013-04-19 NOTE — Telephone Encounter (Signed)
Pt called back and I gave her Dr. Lauro Franklin recommendations and told her I will be sending in Rx to her pharmacy. Pt verbalized understanding

## 2013-04-20 ENCOUNTER — Telehealth: Payer: Self-pay | Admitting: Internal Medicine

## 2013-04-20 ENCOUNTER — Ambulatory Visit (INDEPENDENT_AMBULATORY_CARE_PROVIDER_SITE_OTHER)
Admission: RE | Admit: 2013-04-20 | Discharge: 2013-04-20 | Disposition: A | Discharge status: Home / Self Care | Payer: Medicaid Other | Source: Ambulatory Visit | Attending: Internal Medicine | Admitting: Internal Medicine

## 2013-04-20 DIAGNOSIS — R194 Change in bowel habit: Secondary | ICD-10-CM

## 2013-04-20 DIAGNOSIS — R911 Solitary pulmonary nodule: Secondary | ICD-10-CM

## 2013-04-20 DIAGNOSIS — R198 Other specified symptoms and signs involving the digestive system and abdomen: Secondary | ICD-10-CM

## 2013-04-20 DIAGNOSIS — R141 Gas pain: Secondary | ICD-10-CM

## 2013-04-20 DIAGNOSIS — R140 Abdominal distension (gaseous): Secondary | ICD-10-CM

## 2013-04-20 DIAGNOSIS — R933 Abnormal findings on diagnostic imaging of other parts of digestive tract: Secondary | ICD-10-CM

## 2013-04-20 DIAGNOSIS — R109 Unspecified abdominal pain: Secondary | ICD-10-CM

## 2013-04-20 MED ORDER — IOHEXOL 300 MG/ML  SOLN
100.0000 mL | Freq: Once | INTRAMUSCULAR | Status: AC | PRN
  Administered 2013-04-20: 100 mL via INTRAVENOUS

## 2013-04-20 NOTE — Telephone Encounter (Signed)
Result Note    CT scan reveals 3 lesions on the liver, the etiology of which is unclear   Radiology questioned whether these could be dermoid implants after ovarian dermoid removed earlier this year   I spoke to Dr. Tamela Oddi by phone, she feels that dermoid implants are unlikely as the entire ovary was removed without rupture of dermoid cyst   I would like patient to see Dr. Luisa Hart in clinic for his opinion regarding these lesions   Abdominal CT showed 9 mm left lung lesion, I recommend dedicated chest CT with contrast to further characterize this lesion   Will hold off on endoscopy for now, patient needs follow-up appt with me in about a month or shortly after seeing Dr. Luisa Hart     Informed pt of results and the need for a visit with Dr Luisa Hart and another CT. appt with Dr Luisa Hart is 04/30/13 at 11am. Pt stated understanding.

## 2013-04-23 NOTE — Telephone Encounter (Signed)
Vonna Kotyk,      As per our discussion, the ovary and tube were removed at the time of surgery. The dermoid cyst was not ruptured. The pathology was clearly benign. I discussed this patient with a gyn oncologist who agrees that these lesions are unrelated to the dermoid cyst. Hope this is helpful.         [name]      The above note entered into chart to aid Dr. Donell Beers in her evaluation which is upcoming. Will defer decision on IR bx to Dr. Donell Beers I plan to see the patient in followup after surgical opinion

## 2013-04-23 NOTE — Telephone Encounter (Signed)
Please see below, Needs CCS with Byerly, not Cornett Would proceed with that appt I can ask IR if Byerly agrees [employer] ----- Message ----- From: [name], MD Sent: 04/22/2013 8:04 PM To: Beverley Fiedler, MD Subject: RE: May be better for her to see dr Donell Beers since she does most if our liver work. I think IR biopsy best first step. Thanks  Changed pt's appt to Dr Donell Beers on 05/07/13 arrive 0920am. She will have her CT Scan tomorrow at 1pm. Also cancelled her ECL for 1 month until liver issues have been looked at.

## 2013-04-24 ENCOUNTER — Ambulatory Visit (INDEPENDENT_AMBULATORY_CARE_PROVIDER_SITE_OTHER)
Admission: RE | Admit: 2013-04-24 | Discharge: 2013-04-24 | Disposition: A | Discharge status: Home / Self Care | Payer: Medicaid Other | Source: Ambulatory Visit | Attending: Internal Medicine | Admitting: Internal Medicine

## 2013-04-24 DIAGNOSIS — R933 Abnormal findings on diagnostic imaging of other parts of digestive tract: Secondary | ICD-10-CM

## 2013-04-24 DIAGNOSIS — R911 Solitary pulmonary nodule: Secondary | ICD-10-CM

## 2013-04-24 MED ORDER — IOHEXOL 300 MG/ML  SOLN
80.0000 mL | Freq: Once | INTRAMUSCULAR | Status: AC | PRN
  Administered 2013-04-24: 80 mL via INTRAVENOUS

## 2013-04-25 ENCOUNTER — Telehealth: Payer: Self-pay | Admitting: Internal Medicine

## 2013-04-25 NOTE — Telephone Encounter (Signed)
Pt wanted results of her CT and I informed her I would have to call her tomorrow; I don't want to worry her, but we don't know. Dr Rhea Belton has asked her ID doc, Dr Luciana Axe to review it and we are waiting on his input. Pt stated understanding.

## 2013-04-25 NOTE — Telephone Encounter (Signed)
lmom for pt to call back

## 2013-04-26 ENCOUNTER — Telehealth: Payer: Self-pay | Admitting: *Deleted

## 2013-04-26 ENCOUNTER — Encounter: Payer: Self-pay | Admitting: Internal Medicine

## 2013-04-26 NOTE — Telephone Encounter (Signed)
Yes, I am waiting to discuss imaging results with her ID MD, Dr. Luciana Axe The CT suggests possible infectious process in the lung and we will contact her as soon as we have discussed the results

## 2013-04-26 NOTE — Telephone Encounter (Signed)
Patient called back as she is very anxious about her results. She advised Dr Rhea Belton office advised her to call and get an appt here asap. Advised her if she waits until Dr Luciana Axe reviews the note he may be able to get her in quicker than our first available appt. She does not want to wait she wants an appt today. Gave her the first available appt with Dr Daiva Eves 05/03/13

## 2013-04-26 NOTE — Telephone Encounter (Signed)
Advised pt that Dr Luciana Axe and Dr Rhea Belton have spoken and they have spoken with Dr Donell Beers. Dr Luciana Axe feels the lesions on her lungs might be infection; she was encouraged to call and make an appt with Dr Luciana Axe. Once she has seen Dr Luciana Axe and Dr Donell Beers she will needs to f/u with our office. Pt stated understanding.

## 2013-04-26 NOTE — Telephone Encounter (Signed)
Patient called to get the results on her CT done recently. Advised her Dr Luciana Axe is out of the office but will try to contact him and find out what her wants her to do. She insist that Dr Luciana Axe has already talked to her doctor and a note should be in the system and I could just read it to her. Advised her no such note exist and I will call her back once he advises me what to tell her.

## 2013-04-26 NOTE — Telephone Encounter (Signed)
Error

## 2013-04-29 ENCOUNTER — Encounter (HOSPITAL_COMMUNITY): Payer: Self-pay

## 2013-04-29 ENCOUNTER — Emergency Department (HOSPITAL_COMMUNITY)
Admission: EM | Admit: 2013-04-29 | Discharge: 2013-04-29 | Disposition: A | Discharge status: Home / Self Care | Payer: Medicaid Other | Attending: Emergency Medicine | Admitting: Emergency Medicine

## 2013-04-29 DIAGNOSIS — Z79899 Other long term (current) drug therapy: Secondary | ICD-10-CM | POA: Insufficient documentation

## 2013-04-29 DIAGNOSIS — K219 Gastro-esophageal reflux disease without esophagitis: Secondary | ICD-10-CM | POA: Insufficient documentation

## 2013-04-29 DIAGNOSIS — F411 Generalized anxiety disorder: Secondary | ICD-10-CM | POA: Insufficient documentation

## 2013-04-29 DIAGNOSIS — R0989 Other specified symptoms and signs involving the circulatory and respiratory systems: Secondary | ICD-10-CM | POA: Insufficient documentation

## 2013-04-29 DIAGNOSIS — B20 Human immunodeficiency virus [HIV] disease: Secondary | ICD-10-CM | POA: Insufficient documentation

## 2013-04-29 DIAGNOSIS — IMO0002 Reserved for concepts with insufficient information to code with codable children: Secondary | ICD-10-CM | POA: Insufficient documentation

## 2013-04-29 DIAGNOSIS — R0600 Dyspnea, unspecified: Secondary | ICD-10-CM

## 2013-04-29 DIAGNOSIS — E119 Type 2 diabetes mellitus without complications: Secondary | ICD-10-CM | POA: Insufficient documentation

## 2013-04-29 DIAGNOSIS — R0609 Other forms of dyspnea: Secondary | ICD-10-CM | POA: Insufficient documentation

## 2013-04-29 DIAGNOSIS — R079 Chest pain, unspecified: Secondary | ICD-10-CM | POA: Insufficient documentation

## 2013-04-29 DIAGNOSIS — F319 Bipolar disorder, unspecified: Secondary | ICD-10-CM | POA: Insufficient documentation

## 2013-04-29 DIAGNOSIS — Z91040 Latex allergy status: Secondary | ICD-10-CM | POA: Insufficient documentation

## 2013-04-29 LAB — CBC WITH DIFFERENTIAL/PLATELET
Basophils Absolute: 0 10*3/uL (ref 0.0–0.1)
Basophils Relative: 0 % (ref 0–1)
Hemoglobin: 14.1 g/dL (ref 12.0–15.0)
MCHC: 35 g/dL (ref 30.0–36.0)
Neutro Abs: 5.1 10*3/uL (ref 1.7–7.7)
Neutrophils Relative %: 47 % (ref 43–77)
Platelets: 245 10*3/uL (ref 150–400)
RDW: 13.1 % (ref 11.5–15.5)

## 2013-04-29 MED ORDER — ALBUTEROL SULFATE (5 MG/ML) 0.5% IN NEBU
5.0000 mg | INHALATION_SOLUTION | Freq: Once | RESPIRATORY_TRACT | Status: AC
  Administered 2013-04-29: 5 mg via RESPIRATORY_TRACT
  Filled 2013-04-29: qty 1

## 2013-04-29 NOTE — ED Notes (Signed)
Pt reports asthma flare up x sev days.  Reports tightness/SOB today.  Ventolin neb last at 12 noon, w/ releif.

## 2013-04-29 NOTE — ED Provider Notes (Signed)
CSN: [id number]     Arrival date & time 04/29/13  1710 History   First MD Initiated Contact with Patient 04/29/13 1808     Chief Complaint  Patient presents with  . Asthma   (Consider location/radiation/quality/duration/timing/severity/associated sxs/prior Treatment) HPI Patient presents with concerns of ongoing dyspnea, episodic chest pain.  She states that the dyspnea has been present for a long time, seems worse over the past few days. There is mild relief with home inhale the medication. Over the past days she has also become aware of intermittent spontaneous onset, offset chest pain.  This is sharp, lasts a few moments, anterior, nonradiating. Throughout this time period there is no new fever, chills, vomiting, nausea, diarrhea, confusion, disorientation or other notable complaints. Patient states that she is compliant with her medication. She has a notable history of recently discovered lesions in her liver and left lung. She is scheduled to see her infectious disease team in 4 days  Past Medical History  Diagnosis Date  . HIV infection   . Anxiety   . Depression   . Asthma   . Diabetes mellitus without complication     no meds currently- controlled with diet  . GERD (gastroesophageal reflux disease)   . Neuromuscular disorder     carpal tunnel in both arms  . Human immunodeficiency virus (HIV) disease 12/07/2012  . Herpes genitalis in women 12/07/2012  . Bipolar disorder, unspecified 12/07/2012  . Anxiety state, unspecified 12/07/2012  . Type II or unspecified type diabetes mellitus without mention of complication, not stated as uncontrolled 12/07/2012  . Unspecified asthma(493.90) 12/07/2012  . GERD (gastroesophageal reflux disease) 12/07/2012   Past Surgical History  Procedure Laterality Date  . Cesarean section    . Robotic assisted bilateral salpingo oopherectomy Right 12/04/2012    Procedure: ROBOTIC ASSISTED Right  SALPINGO OOPHORECTOMY;  Surgeon: [name], MD;   Location: [facility];  Service: Gynecology;  Laterality: Right;  . Laparotomy N/A 12/04/2012    Procedure: EXPLORATORY LAPAROTOMY;  Surgeon: [name], MD;  Location: [facility];  Service: Gynecology;  Laterality: N/A;  repair of serosal injury  . Laparoscopic lysis of adhesions N/A 12/04/2012    Procedure: LAPAROSCOPIC LYSIS OF ADHESIONS;  Surgeon: [name], MD;  Location: [facility];  Service: Gynecology;  Laterality: N/A;   Family History  Problem Relation Age of Onset  . Arthritis/Rheumatoid Mother   . Hypertension Mother    History  Substance Use Topics  . Smoking status: Never Smoker   . Smokeless tobacco: Never Used  . Alcohol Use: No   OB History   Grav Para Term Preterm Abortions TAB SAB Ect Mult Living                 Review of Systems  Constitutional:       Per HPI, otherwise negative  HENT:       Per HPI, otherwise negative  Respiratory:       Per HPI, otherwise negative  Cardiovascular:       Per HPI, otherwise negative  Gastrointestinal: Negative for vomiting.  Endocrine:       Negative aside from HPI  Genitourinary:       Neg aside from HPI   Musculoskeletal:       Per HPI, otherwise negative  Skin: Negative.   Neurological: Negative for syncope.    Allergies  Tape; Benzoin; Latex; and Sulfamethoxazole w-trimethoprim  Home Medications   Current Outpatient Rx  Name  Route  Sig  Dispense  Refill  . acetaminophen (TYLENOL) 325 MG tablet   Oral   Take 2 tablets (650 mg total) by mouth every 6 (six) hours as needed for pain.         Marland Kitchen albuterol (PROVENTIL HFA;VENTOLIN HFA) 108 (90 BASE) MCG/ACT inhaler   Inhalation   Inhale 2 puffs into the lungs every 6 (six) hours as needed for wheezing.         Marland Kitchen amitriptyline (ELAVIL) 25 MG tablet   Oral   Take 25 mg by mouth at bedtime.         . beclomethasone (QVAR) 40 MCG/ACT inhaler   Inhalation   Inhale 2 puffs into the lungs 2 (two) times daily.   1 Inhaler   5   . Darunavir Ethanolate  (PREZISTA) 800 MG tablet   Oral   Take 800 mg by mouth daily.         Roselie Awkward Sodium (TIVICAY) 50 MG TABS   Oral   Take 50 mg by mouth daily.         . DULoxetine (CYMBALTA) 30 MG capsule   Oral   Take 30 mg by mouth daily.         Marland Kitchen emtricitabine-tenofovir (TRUVADA) 200-300 MG per tablet   Oral   Take 1 tablet by mouth 2 (two) times daily.         . flunisolide (NASAREL) 29 MCG/ACT (0.025%) nasal spray   Nasal   Place 2 sprays into the nose at bedtime. Dose is for each nostril.   25 mL   12   . lithium carbonate (ESKALITH) 450 MG CR tablet   Oral   Take 450 mg by mouth. Take 1 tablet in the morning. Take 2 tablets at night.         . montelukast (SINGULAIR) 10 MG tablet   Oral   Take 10 mg by mouth at bedtime.         Marland Kitchen MOVIPREP 100 G SOLR      Use per prep instruction   1 kit   0     Dispense as written.   Marland Kitchen omeprazole (PRILOSEC) 40 MG capsule   Oral   Take 1 capsule (40 mg total) by mouth daily.   90 capsule   3     Take 1 capsule p.o qd 30 minutes before breakfast   . ondansetron (ZOFRAN ODT) 4 MG disintegrating tablet   Oral   Take 1 tablet (4 mg total) by mouth every 8 (eight) hours as needed for nausea.   20 tablet   0   . pramoxine-hydrocortisone (ANALPRAM-HC) 1-1 % rectal cream   Rectal   Place rectally 2 (two) times daily.   30 g   0   . Prenatal Vit-Fe Fumarate-FA (MULTIVITAMIN-PRENATAL) 27-0.8 MG TABS   Oral   Take 2 tablets by mouth daily at 12 noon. Uses Gummies.         . ritonavir (NORVIR) 100 MG TABS tablet   Oral   Take 100 mg by mouth daily.         . SUMAtriptan (IMITREX) 100 MG tablet   Oral   Take 100 mg by mouth every 2 (two) hours as needed for migraine.         . traMADol (ULTRAM) 50 MG tablet   Oral   Take 50-100 mg by mouth every 6 (six) hours as needed for pain.         . TRUVADA 200-300 MG per tablet  TAKE ONE TABLET   BY MOUTH   DAILY   30 tablet   0   . valACYclovir (VALTREX)  500 MG tablet   Oral   Take 500 mg by mouth 2 (two) times daily.         . ziprasidone (GEODON) 40 MG capsule   Oral   Take 40 mg by mouth at bedtime.           BP 110/78  Pulse 69  Temp(Src) 98.5 F (36.9 C) (Oral)  Resp 20  SpO2 100%  LMP 04/01/2013 Physical Exam  Nursing note and vitals reviewed. Constitutional: She is oriented to person, place, and time. She appears well-developed and well-nourished. No distress.  HENT:  Head: Normocephalic and atraumatic.  Eyes: Conjunctivae and EOM are normal.  Cardiovascular: Normal rate and regular rhythm.   Pulmonary/Chest: Effort normal and breath sounds normal. No stridor. No respiratory distress.  Abdominal: She exhibits no distension.  Musculoskeletal: She exhibits no edema.  Neurological: She is alert and oriented to person, place, and time. No cranial nerve deficit.  Skin: Skin is warm and dry.  Psychiatric: She has a normal mood and affect.    ED Course  Procedures (including critical care time) Labs Review Labs Reviewed  CBC WITH DIFFERENTIAL  QUANTIFERON TB GOLD ASSAY   Imaging Review No results found. Pulse oximetry 100% room air normal.  After the initial evaluation I reviewed the CT images myself, interpreted, discussed them with the patient's infectious disease team.  Given the patient's absence of fever, chills, other overtly infectious stigmata, though she does require evaluation and management of her lesions, this may be performed as an outpatient if her labs are unremarkable.  Per request, TB test was added onto labs.  I reviewed the patient's chart, including recent lab studies demonstrating a viral load of minimal amounts, and CD4 count greater than 700  8:16 PM Labs are minimally abnormal, but the patient is afebrile, with no hypoxia, tachypnea, tachycardia.  MDM  No diagnosis found. This generally well-appearing young female, though with HIV, now presents with intermittent chest pain, dyspnea.  On  exam she is awake alert, hypoxia, tachycardia, tachypnea, fever, chills, other notable signs are suggestive of significant current infection.  With the patient's reassuring labs, she is appropriate for discharged to follow up with her infectious disease team as an outpatient after I discussed her with them.    Gerhard Munch, MD 04/29/13 2016

## 2013-04-30 ENCOUNTER — Encounter (INDEPENDENT_AMBULATORY_CARE_PROVIDER_SITE_OTHER): Payer: Medicaid Other | Admitting: Surgery

## 2013-05-02 LAB — QUANTIFERON TB GOLD ASSAY (BLOOD)
Interferon Gamma Release Assay: NEGATIVE
Quantiferon Nil Value: 0.04 IU/mL
TB Ag value: 0.04 IU/mL

## 2013-05-02 NOTE — Telephone Encounter (Signed)
Rob Could you please review [name]'s chest and abd/pelvis CT scans. I saw her recently in clinic to evaluate abdominal pain and change in bowel habits CT scan abdomen and pelvis showed several liver lesions which radiology felt maybe implants from previous dermoid cyst removal. I spoke to her gynecologist who did not feel the cyst was ruptured at the time of removal making this etiology more unlikely Abdominal CT showed a lung lesion and dedicated CT of the chest is concerning for cavitary infectious lung lesion. Would appreciate your insight and help in this matter She is seeing Dr. Almond Lint with general surgery soon to discuss the liver lesions, but perhaps all is tied together. Thanks for your help Erick Blinks [facility]    Added this note as an addendum so it will be a part of the chart.

## 2013-05-03 ENCOUNTER — Encounter: Payer: Self-pay | Admitting: Infectious Disease

## 2013-05-03 ENCOUNTER — Ambulatory Visit (INDEPENDENT_AMBULATORY_CARE_PROVIDER_SITE_OTHER): Payer: Medicaid Other | Admitting: Infectious Disease

## 2013-05-03 VITALS — BP 136/82 | HR 81 | Temp 98.4°F | Wt 183.0 lb

## 2013-05-03 DIAGNOSIS — J984 Other disorders of lung: Secondary | ICD-10-CM

## 2013-05-03 DIAGNOSIS — K769 Liver disease, unspecified: Secondary | ICD-10-CM

## 2013-05-03 DIAGNOSIS — K7689 Other specified diseases of liver: Secondary | ICD-10-CM

## 2013-05-03 DIAGNOSIS — J45909 Unspecified asthma, uncomplicated: Secondary | ICD-10-CM

## 2013-05-03 DIAGNOSIS — K219 Gastro-esophageal reflux disease without esophagitis: Secondary | ICD-10-CM

## 2013-05-03 DIAGNOSIS — Z23 Encounter for immunization: Secondary | ICD-10-CM

## 2013-05-03 DIAGNOSIS — B20 Human immunodeficiency virus [HIV] disease: Secondary | ICD-10-CM

## 2013-05-03 LAB — CRYPTOCOCCAL ANTIGEN: Crypto Ag: NEGATIVE

## 2013-05-03 NOTE — Patient Instructions (Addendum)
We will get blood work today and then have you come back in November to see Dr. Luciana Axe and consider repeat CT chst at that time

## 2013-05-03 NOTE — Progress Notes (Signed)
Subjective:    Patient ID: [name], female    DOB: 03/06/1981, 32 y.o.   MRN: [id number]  HPI  [name] is a 32 y.o. female with HIV infection who is  Currently doing extremely well with her antiviral regimen, with viral load 32 and health cd4 count. She was being worked up for persistent gastrointestinal symptoms including gastroesophageal reflux disease and abdominal pain and a CT of the abdomen and pelvis was performed which showed  There are at least three fat attenuating lesions along the surface  of the liver. The largest measures up to 3.2 cm at the dome.  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  The uterus has a heterogeneous appearance and there may be a small  cyst in the left ovary. These findings could be better  characterized with ultrasound if needed.  9 mm round structure in the left lower lobe. This area is  incompletely evaluated. This could represent a pulmonary nodule or  airspace disease. Recommend further characterization of the left  lower lung density with a dedicated chest CT.  This was followed by CT chest which showed: 1.In axial image 29 there is a somewhat cavitary lesion in the left  lower lobe posteriorly measures 1.2 cm x 1.2 cm. This is best  visualized in coronal image 87 and central cavitation is confirmed.  Findings are suspicious for focal infectious process, or septic  embolus. A cavitary neoplasm cannot be entirely excluded. Clinical  correlation is necessary. Follow-up short term CT scan in 2-3  months is recommended to assure resolution after treatment. No  additional pulmonary nodules are noted.  2. No segmental infiltrate or pulmonary edema. No pleural  effusion.  3. No adenopathy.  4. Stable fat-containing lesions along the liver is upper abdomen.  She was scheduled with me today to consider workup of this cavitary lesion. She has been  without much in the way of cough she has not had fever she has not had weight loss. She states that she has had a prior CT scan of the abdomen and pelvis in 2012 it was done at outside hospital but which I don't have access to. Certainly CT scan done in 2008 failed to show any cavitary findings. Her HIV was poorly controlled back in 2008 2009 so certainly possible that she could see comment infection during that time period and that the findings on CT scan now may reflect an old infection. My suspicion for tuberculosis is very low. Her QuantiFERON test forwarded source was also negative. I'm going to send off some serologies for fungal infection and obtain blood cultures,  2d echo, but I do not feel that we should shoot any aggressive workup of this cavitary lesion at this point in time but instead reimage in 2 months time.     Review of Systems  Constitutional: Negative for fever, chills, diaphoresis, activity change, appetite change, fatigue and unexpected weight change.  HENT: Negative for congestion, sore throat, rhinorrhea, sneezing, trouble swallowing and sinus pressure.   Eyes: Negative for photophobia and visual disturbance.  Respiratory: Positive for chest tightness and wheezing. Negative for cough, shortness of breath and stridor.   Cardiovascular: Negative for chest pain, palpitations and leg swelling.  Gastrointestinal: Positive for abdominal pain. Negative for nausea, vomiting, diarrhea, constipation, blood in stool, abdominal distention and anal bleeding.  Genitourinary: Negative for dysuria, hematuria, flank pain and difficulty urinating.  Musculoskeletal: Negative for myalgias, back pain, joint swelling, arthralgias and gait problem.  Skin: Negative for color change, pallor, rash and wound.  Neurological: Negative for dizziness, tremors, weakness and light-headedness.  Hematological: Negative for adenopathy. Does not bruise/bleed easily.  Psychiatric/Behavioral: Negative for  behavioral problems, confusion, sleep disturbance, dysphoric mood, decreased concentration and agitation.       Objective:   Physical Exam  Constitutional: She is oriented to person, place, and time. She appears well-developed and well-nourished. No distress.  HENT:  Head: Normocephalic and atraumatic.  Mouth/Throat: Oropharynx is clear and moist. No oropharyngeal exudate.  Eyes: Conjunctivae and EOM are normal. Pupils are equal, round, and reactive to light. No scleral icterus.  Neck: Normal range of motion. Neck supple. No JVD present.  Cardiovascular: Normal rate, regular rhythm and normal heart sounds.  Exam reveals no gallop and no friction rub.   No murmur heard. Pulmonary/Chest: Effort normal and breath sounds normal. No respiratory distress. She has no wheezes. She has no rales. She exhibits no tenderness.  Abdominal: She exhibits no distension and no mass. There is no tenderness. There is no rebound and no guarding.  Musculoskeletal: She exhibits no edema and no tenderness.  Lymphadenopathy:    She has no cervical adenopathy.  Neurological: She is alert and oriented to person, place, and time. She exhibits normal muscle tone. Coordination normal.  Skin: Skin is warm and dry. She is not diaphoretic. No erythema. No pallor.  Psychiatric: She has a normal mood and affect. Her behavior is normal. Judgment and thought content normal.          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 --Check 2-D echocardiogram  HIV: Continue her current antiretroviral regimen. There is absolutely no drug interaction between her Prilosec and her TIVICAY. There would be a drug interaction between TIVICAY and calcium or magnesium salts including the antacid TUMS. As she is not taking any of these drugs there should be no concern for drug interaction and she can take  her Prilosec without having to schedule at around her antiretrovirals.  Liver lesions: Being worked up by gastroenterology and  SURGERY.  GErd: continue PPI as above  Asthma: not wheezing today, bronchodilators, inhaled steroids  Chest tightness: worked up in ED lkely in part due to anxiety, no suspicion of CAD or pe

## 2013-05-04 ENCOUNTER — Encounter: Payer: Medicaid Other | Admitting: Internal Medicine

## 2013-05-04 LAB — T-HELPER CELL (CD4) - (RCID CLINIC ONLY)
CD4 % Helper T Cell: 32 % — ABNORMAL LOW (ref 33–55)
CD4 T Cell Abs: 1000 /uL (ref 400–2700)

## 2013-05-07 ENCOUNTER — Telehealth (INDEPENDENT_AMBULATORY_CARE_PROVIDER_SITE_OTHER): Payer: Self-pay

## 2013-05-07 ENCOUNTER — Encounter (INDEPENDENT_AMBULATORY_CARE_PROVIDER_SITE_OTHER): Payer: Self-pay | Admitting: General Surgery

## 2013-05-07 ENCOUNTER — Telehealth: Payer: Self-pay | Admitting: *Deleted

## 2013-05-07 ENCOUNTER — Ambulatory Visit (INDEPENDENT_AMBULATORY_CARE_PROVIDER_SITE_OTHER): Payer: Medicaid Other | Admitting: General Surgery

## 2013-05-07 VITALS — BP 118/68 | HR 68 | Temp 98.2°F | Resp 14 | Ht 63.25 in | Wt 186.4 lb

## 2013-05-07 DIAGNOSIS — R160 Hepatomegaly, not elsewhere classified: Secondary | ICD-10-CM | POA: Insufficient documentation

## 2013-05-07 DIAGNOSIS — N83209 Unspecified ovarian cyst, unspecified side: Secondary | ICD-10-CM | POA: Insufficient documentation

## 2013-05-07 DIAGNOSIS — K769 Liver disease, unspecified: Secondary | ICD-10-CM

## 2013-05-07 NOTE — Patient Instructions (Signed)
Get MRI, follow up with me afterward (around 1.5-2 weeks)

## 2013-05-07 NOTE — Telephone Encounter (Signed)
Message copied by Florene Glen on Mon May 07, 2013  2:19 PM ------      Message from: Beverley Fiedler      Created: Mon May 07, 2013  1:33 PM      Regarding: RE: Cavitary lung lesion       Ok      Thanks for seeing her.      I will get her back in clinic to discuss colonoscopy.      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  i will likely end up operating on her.  We are getting MRI and she is thinking about things.  If we are going to OR, I would prefer to get colonoscopy prior to surgery in case something else needs to happen in the OR.            tx      FB            ----- Message -----         From: Beverley Fiedler, MD         Sent: 04/26/2013   3:16 PM           To: Almond Lint, MD      Subject: RE: Cavitary lung lesion                                 Faera      I got this from her ID MD today      Just FYI      Thanks for seeing her            "Vonna Kotyk,             From my read of it, it does not seem that the liver and lung lesions are related, maybe though if it is cancer with her symptoms.  I will though discuss with her the lung lesion and see how aggressive she wants to be and see about biopsy of it.  Thanks.                   Rob "                        ----- Message -----         From: Almond Lint, MD         Sent: 04/26/2013   3:15 PM           To: Beverley Fiedler, MD      Subject: RE: Cavitary lung lesion  i have her on schedule 9/22.  i am not in clinic next week as i am dr. Rennis Petty the week.        tx      FB      ----- Message -----         From: Beverley Fiedler, MD         Sent: 04/24/2013   6:03 PM           To: Almond Lint, MD, Linna Hoff, RN, #      Subject: Cavitary lung lesion                                     Rob      Could you please review Keishawn's chest and abd/pelvis CT scans.      I saw her recently in clinic to evaluate abdominal pain and change in bowel habits      CT scan abdomen and pelvis showed several liver lesions which radiology felt maybe implants from previous dermoid cyst removal.      I spoke to her gynecologist who did not feel the cyst was ruptured at the time of removal making this etiology more unlikely      Abdominal CT showed a lung lesion and dedicated CT of the chest is concerning for cavitary infectious lung lesion.      Would appreciate your insight and help in this matter      She is seeing Dr. Almond Lint with general surgery soon to discuss the liver lesions, but perhaps all is tied together.      Thanks for your help      Erick Blinks      [facility]                               ------

## 2013-05-07 NOTE — Telephone Encounter (Signed)
Spoke with pt to inform her Dr Rhea Belton wants to see her in the ofc and then he will schedule her for a COLON; 1st available OV is 05/31/13. Pt states this won''t work, Dr Donell Beers wants everything done prior to 05/23/13. Tentatively scheduled pt for an ECL on 05/10/13 until I can speak with Dr Rhea Belton. Pt states she really doesn't need to be seen , she understanding what Dr Donell Beers is saying. OK to go ahead and schedule the ECL w/o an OV? Thanks.

## 2013-05-07 NOTE — Telephone Encounter (Signed)
LMOV pt's open MRI is scheduled at [address], [facility] for 05/18/13 at 10:15a.m.  Nothing to eat or drink 4 hours prior to scan.

## 2013-05-07 NOTE — Telephone Encounter (Signed)
Pt returning call.  Relayed message below.  Pt agreeable

## 2013-05-08 NOTE — Telephone Encounter (Signed)
Yes ECL on Thursday PM is okay, I have discussed the procedures when I 1st met with the patient in clinic. She will need prep instructions and to complete the informed consent paperwork

## 2013-05-08 NOTE — Telephone Encounter (Signed)
Pt reports she cannot come today for a PV; she has to pick up/meet her little girl after school. She states she has all the information and we changed the times and discussed her prep and she states she's sure she can do it. Spoke with Eugene Garnet and she has all of her previous paperwork. Pt will see [place] on Thursday at 2pm.

## 2013-05-10 ENCOUNTER — Encounter: Payer: Self-pay | Admitting: Internal Medicine

## 2013-05-10 ENCOUNTER — Ambulatory Visit (AMBULATORY_SURGERY_CENTER): Payer: Medicaid Other | Admitting: Internal Medicine

## 2013-05-10 VITALS — BP 110/72 | HR 57 | Temp 98.2°F | Resp 14 | Ht 63.25 in | Wt 186.0 lb

## 2013-05-10 DIAGNOSIS — R110 Nausea: Secondary | ICD-10-CM

## 2013-05-10 DIAGNOSIS — K2980 Duodenitis without bleeding: Secondary | ICD-10-CM

## 2013-05-10 DIAGNOSIS — R198 Other specified symptoms and signs involving the digestive system and abdomen: Secondary | ICD-10-CM

## 2013-05-10 DIAGNOSIS — R194 Change in bowel habit: Secondary | ICD-10-CM

## 2013-05-10 DIAGNOSIS — R933 Abnormal findings on diagnostic imaging of other parts of digestive tract: Secondary | ICD-10-CM

## 2013-05-10 DIAGNOSIS — S36539S Laceration of unspecified part of colon, sequela: Secondary | ICD-10-CM

## 2013-05-10 DIAGNOSIS — K219 Gastro-esophageal reflux disease without esophagitis: Secondary | ICD-10-CM

## 2013-05-10 DIAGNOSIS — D131 Benign neoplasm of stomach: Secondary | ICD-10-CM

## 2013-05-10 LAB — CULTURE, BLOOD (SINGLE): Organism ID, Bacteria: NO GROWTH

## 2013-05-10 MED ORDER — SODIUM CHLORIDE 0.9 % IV SOLN: 500.0000 mL | INTRAVENOUS | Status: DC

## 2013-05-10 NOTE — Progress Notes (Signed)
Chief Complaint  Patient presents with  . New Evaluation    discuss ct results    HISTORY: Pt is a 32 yo F who presents with liver lesions.  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  She had surgery to remove the tumor.  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 Luisa Hart).  She has continued to have issues with abdominal pain, bloating, and irregularity of stool/bleeding.  As part of the workup of her symptoms, she had abd Ct scan and was found to have several fatty lesions that appeared to be on the surface of the liver.    She is not on any narcotics.  She is HIV positive as well.  She has good control with low viral load and good CD 4 count.  She has also been discovered to have cavitary lung lesion which is being worked up by Dr. Daiva Eves.  She is referred by Dr. Rhea Belton in consultation for these liver lesions.    Past Medical History  Diagnosis Date  . HIV infection   . Anxiety   . Depression   . Asthma   . Diabetes mellitus without complication     no meds currently- controlled with diet  . GERD (gastroesophageal reflux disease)   . Neuromuscular disorder     carpal tunnel in both arms  . Human immunodeficiency virus (HIV) disease 12/07/2012  . Herpes genitalis in women 12/07/2012  . Bipolar disorder, unspecified 12/07/2012  . Anxiety state, unspecified 12/07/2012  . Type II or unspecified type diabetes mellitus without mention of complication, not stated as uncontrolled 12/07/2012  . Unspecified asthma(493.90) 12/07/2012  . GERD (gastroesophageal reflux disease) 12/07/2012  . Anemia   . Arthritis   . Allergy     Past Surgical History  Procedure Laterality Date  . Cesarean section    . Robotic assisted bilateral salpingo oopherectomy Right 12/04/2012    Procedure: ROBOTIC ASSISTED Right  SALPINGO OOPHORECTOMY;  Surgeon: [name], MD;  Location: [facility];  Service: Gynecology;  Laterality: Right;  . Laparotomy N/A 12/04/2012    Procedure: EXPLORATORY LAPAROTOMY;  Surgeon: [name], MD;  Location: [facility];  Service: Gynecology;  Laterality: N/A;  repair of serosal injury  . Laparoscopic lysis of adhesions N/A 12/04/2012    Procedure: LAPAROSCOPIC LYSIS OF ADHESIONS;  Surgeon: [name], MD;  Location: [facility];  Service: Gynecology;  Laterality: N/A;    Current Outpatient Prescriptions  Medication Sig Dispense Refill  . acetaminophen (TYLENOL) 325 MG tablet Take 2 tablets (650 mg total) by mouth every 6 (six) hours as needed for pain.      Marland Kitchen albuterol (PROVENTIL HFA;VENTOLIN HFA) 108 (90 BASE) MCG/ACT inhaler Inhale 2 puffs into the lungs every 6 (six) hours as needed for wheezing.      Marland Kitchen amitriptyline (ELAVIL) 25 MG tablet Take 25 mg by mouth at bedtime.      . beclomethasone (QVAR) 40 MCG/ACT inhaler Inhale 2 puffs into the lungs 2 (two) times daily.  1 Inhaler  5  . cetirizine (ZYRTEC) 10 MG tablet Take 10 mg by mouth daily.      . Darunavir Ethanolate (PREZISTA) 800 MG tablet Take 800 mg by mouth daily.      Roselie Awkward Sodium (TIVICAY) 50 MG TABS Take 50 mg by mouth daily.      Marland Kitchen  DULoxetine (CYMBALTA) 30 MG capsule Take 30 mg by mouth daily.      Marland Kitchen emtricitabine-tenofovir (TRUVADA) 200-300 MG per tablet Take 1 tablet by mouth 2 (two) times daily.      . flunisolide (NASAREL) 29 MCG/ACT (0.025%) nasal spray Place 2 sprays into the nose at bedtime. Dose is for each nostril.  25 mL  12  . lithium carbonate (ESKALITH) 450 MG CR tablet Take 450 mg by mouth. Take 1 tablet in the morning. Take 2 tablets at night.      . montelukast (SINGULAIR) 10 MG tablet Take 10 mg by mouth at bedtime.      Marland Kitchen omeprazole (PRILOSEC) 40 MG capsule Take 1 capsule (40 mg total) by mouth daily.  90 capsule  3  . ondansetron (ZOFRAN ODT) 4 MG disintegrating tablet Take 1 tablet (4 mg total) by mouth every 8 (eight)  hours as needed for nausea.  20 tablet  0  . pramoxine-hydrocortisone (ANALPRAM-HC) 1-1 % rectal cream Place rectally 2 (two) times daily.  30 g  0  . ritonavir (NORVIR) 100 MG TABS tablet Take 100 mg by mouth daily.      . SUMAtriptan (IMITREX) 100 MG tablet Take 100 mg by mouth every 2 (two) hours as needed for migraine.      . traMADol (ULTRAM) 50 MG tablet Take 50-100 mg by mouth every 6 (six) hours as needed for pain.      . valACYclovir (VALTREX) 500 MG tablet Take 500 mg by mouth 2 (two) times daily.      . ziprasidone (GEODON) 40 MG capsule Take 40 mg by mouth at bedtime.       . Prenatal Vit-Fe Fumarate-FA (MULTIVITAMIN-PRENATAL) 27-0.8 MG TABS Take 2 tablets by mouth daily at 12 noon. Uses Gummies.       No current facility-administered medications for this visit.     Allergies  Allergen Reactions  . Benzoin Other (See Comments)    blisters  . Latex Other (See Comments)    Only condoms cause irritation  . Sulfamethoxazole W-Trimethoprim Itching and Nausea And Vomiting     Family History  Problem Relation Age of Onset  . Arthritis/Rheumatoid Mother   . Hypertension Mother   . Cancer Maternal Aunt     brand and lung  . Cancer Maternal Grandmother     breasts  . Cancer Paternal Grandmother     breast and kidney     History   Social History  . Marital Status: Single    Spouse Name: N/A    Number of Children: N/A  . Years of Education: N/A   Social History Main Topics  . Smoking status: Never Smoker   . Smokeless tobacco: Never Used  . Alcohol Use: Yes     Comment: OCC.  . Drug Use: No  . Sexual Activity: Not Currently    Partners: Male    Birth Control/ Protection: Injection     Comment: pt. declined condoms   Other Topics Concern  . None   Social History Narrative  . None     REVIEW OF SYSTEMS - PERTINENT POSITIVES ONLY: 12 point review of systems negative other than HPI and PMH except for Nasal congestion, cough, wheezing, chest pain, joint  pain, headaches  EXAM: Filed Vitals:   05/07/13 0941  BP: 118/68  Pulse: 68  Temp: 98.2 F (36.8 C)  Resp: 14   Filed Weights   05/07/13 0941  Weight: 186 lb 6.4 oz (84.55 kg)  Gen:  No acute distress.  Well nourished and well groomed.   Neurological: Alert and oriented to person, place, and time. Coordination normal.  Head: Normocephalic and atraumatic.  Eyes: Conjunctivae are normal. Pupils are equal, round, and reactive to light. No scleral icterus.  Neck: Normal range of motion. Neck supple. No tracheal deviation or thyromegaly present.  Cardiovascular: Normal rate, regular rhythm, normal heart sounds and intact distal pulses.  Exam reveals no gallop and no friction rub.  No murmur heard. Respiratory: Effort normal.  No respiratory distress. No chest wall tenderness. Breath sounds normal.  No wheezes, rales or rhonchi.  GI: Soft. Bowel sounds are normal. The abdomen is soft and nontender.  There is no rebound and no guarding. Lower midline incision.   Musculoskeletal: Normal range of motion. Extremities are nontender.  Lymphadenopathy: No cervical, preauricular, postauricular or axillary adenopathy is present Skin: Skin is warm and dry. No rash noted. No diaphoresis. No erythema. No pallor. No clubbing, cyanosis, or edema.   Psychiatric: Normal mood and affect. Behavior is normal. Judgment and thought content normal.    LABORATORY RESULTS: Available labs are reviewed  CD  4 count 1000, HIV RNA 57 copies/mL  RADIOLOGY RESULTS: See E-Chart or I-Site for most recent results.  Images and reports are reviewed. CT scan abd/pelvis IMPRESSION:  There are at least three fat attenuating lesions along the surface  of the liver. The largest measures up to 3.2 cm at the dome.  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  The uterus has a heterogeneous appearance and there may be a small  cyst in the  left ovary. These findings could be better  characterized with ultrasound if needed.  9 mm round structure in the left lower lobe. This area is  incompletely evaluated. This could represent a pulmonary nodule or  airspace disease. Recommend further characterization of the left  lower lung density with a dedicated chest CT.     ASSESSMENT AND PLAN: Superficial Liver masses, right lobe; suspected dermoid recurrence.   Will get MRI to better delineate whether these masses are on the capsule, or under the liver capsule, or in the diaphragm.  This will assist with surgical planning.   Her original dermoid cyst ruptured, which makes these nodules likely to just be capsular implants.  She did not get scanned in between in order to know.    I recommended surgical resection.  She may require just local excision from the surface of the liver vs right lobectomy.  If we can do surface excision, might be able to do laparoscopically.    She will follow up after MRI with her mom for further discussion.  She would like to think about it.  She just had major surgery with rectal injury during pelvic surgery and is still having trouble regulating her bowels.    She has been planning to have colonoscopy to evaluate her bowel dysfunction.  I recommended scheduling that now because if we are going to the operating room, it would be nice to know if any portion of her bowel needs surgery.  I also messaged Dr. Rhea Belton to see if we can expedite that.     45 min spent in evaluation, examination, counseling, and coordination of care.  >50% spent in counseling.    She also is seeing Dr. Daiva Eves about a cavitary lesion in her lung.  We will wait for cultures  to come back prior to any planned surgery.     Maudry Diego MD Surgical Oncology, General and Endocrine Surgery [facility]      Visit Diagnoses: 1. Liver mass, right lobe   2. Ovarian cystic mass     Primary Care  Physician: Geraldo Pitter, MD

## 2013-05-10 NOTE — Progress Notes (Signed)
Called to room to assist during endoscopic procedure.  Patient ID and intended procedure confirmed with present staff. Received instructions for my participation in the procedure from the performing physician.  

## 2013-05-10 NOTE — Op Note (Signed)
[facility] [address] [place], [address]   COLONOSCOPY PROCEDURE REPORT  PATIENT: [name], [name]  MR#: [id number] BIRTHDATE: [birth year] , 31  yrs. old GENDER: Female ENDOSCOPIST: Beverley Fiedler, MD PROCEDURE DATE:  05/10/2013 PROCEDURE:   Colonoscopy, diagnostic First Screening Colonoscopy - Avg.  risk and is 50 yrs.  old or older - No.  Prior Negative Screening - Now for repeat screening. N/A  History of Adenoma - Now for follow-up colonoscopy & has been > or = to 3 yrs.  N/A  Polyps Removed Today? No.  Recommend repeat exam, <10 yrs? No. ASA CLASS:   Class III INDICATIONS:Change in bowel habits; Abdominal pain. History of sigmoid colon injury s/p surgical repair; abnormal GI imaging MEDICATIONS: MAC sedation, administered by CRNA and propofol (Diprivan) 200mg  IV  DESCRIPTION OF PROCEDURE:   After the risks benefits and alternatives of the procedure were thoroughly explained, informed consent was obtained.  A digital rectal exam revealed no rectal mass.   The LB [id number]  endoscope was introduced through the anus and advanced to the terminal ileum which was intubated for a short distance. No adverse events experienced.   The quality of the prep was good, using MoviPrep  The instrument was then slowly withdrawn as the colon was fully examined.  COLON FINDINGS: The mucosa appeared normal in the terminal ileum. A normal appearing cecum, ileocecal valve, and appendiceal orifice were identified.  The ascending, hepatic flexure, transverse, splenic flexure, descending, sigmoid colon and rectum appeared unremarkable.  No polyps or cancers were seen.  Retroflexed views revealed no abnormalities. The time to cecum=2 minutes 04 seconds. Withdrawal time=9 minutes 11 seconds.  The scope was withdrawn and the procedure completed.  COMPLICATIONS: There were no complications.  ENDOSCOPIC IMPRESSION: 1.   Normal mucosa in the terminal ileum 2.   Normal  colon  RECOMMENDATIONS: Proceed with surgical evaluation per Dr.  Donell Beers   eSigned:  Beverley Fiedler, MD 05/10/2013 3:32 PM   cc: The Patient and Almond Lint, MD; Merceda Elks, MD   PATIENT NAME:  [name] MR#: [id number]

## 2013-05-10 NOTE — Progress Notes (Signed)
Patient did not experience any of the following events: a burn prior to discharge; a fall within the facility; wrong site/side/patient/procedure/implant event; or a hospital transfer or hospital admission upon discharge from the facility. (G8907) Patient did not have preoperative order for IV antibiotic SSI prophylaxis. (G8918)  

## 2013-05-10 NOTE — Patient Instructions (Addendum)

## 2013-05-10 NOTE — Assessment & Plan Note (Addendum)
Will get MRI to better delineate whether these masses are on the capsule, or under the liver capsule, or in the diaphragm.  This will assist with surgical planning.   Her original dermoid cyst ruptured, which makes these nodules likely to just be capsular implants.  She did not get scanned in between in order to know.    I recommended surgical resection.  She may require just local excision from the surface of the liver vs right lobectomy.  If we can do surface excision, might be able to do laparoscopically.    She will follow up after MRI with her mom for further discussion.  She would like to think about it.  She just had major surgery with rectal injury during pelvic surgery and is still having trouble regulating her bowels.    She has been planning to have colonoscopy to evaluate her bowel dysfunction.  I recommended scheduling that now because if we are going to the operating room, it would be nice to know if any portion of her bowel needs surgery.  I also messaged Dr. Rhea Belton to see if we can expedite that.     45 min spent in evaluation, examination, counseling, and coordination of care.  >50% spent in counseling.

## 2013-05-10 NOTE — Op Note (Signed)
 [facility] [address] [place], [address]   ENDOSCOPY PROCEDURE REPORT  PATIENT: Carol, Neal  MR#: [id number] BIRTHDATE: July 20, 1981 , 31  yrs. old GENDER: Female ENDOSCOPIST: Beverley Fiedler, MD REFERRED BY:   Dr. Staci Righter, MD PROCEDURE DATE:  05/10/2013 PROCEDURE:  EGD w/ biopsy for H.pylori ASA CLASS:     Class III INDICATIONS:  Nausea.   history of GERD.   abnormal CT of the GI tract. MEDICATIONS: MAC sedation, administered by CRNA and propofol (Diprivan) 200mg  IV TOPICAL ANESTHETIC: Cetacaine Spray  DESCRIPTION OF PROCEDURE: After the risks benefits and alternatives of the procedure were thoroughly explained, informed consent was obtained.  The LB [id number] endoscope was introduced through the mouth and advanced to the second portion of the duodenum. Without limitations.  The instrument was slowly withdrawn as the mucosa was fully examined.     ESOPHAGUS: The mucosa of the esophagus appeared normal.   Normal Z-line, 39 cm from the incisors  STOMACH: The mucosa of the stomach appeared normal.  Biopsies were taken in the antrum and angularis.  DUODENUM: Mild duodenal inflammation was found in the duodenal bulb. The second portion of the duodenum was normal in the examined areas. Retroflexed views revealed no abnormalities.     The scope was then withdrawn from the patient and the procedure completed.  COMPLICATIONS: There were no complications.  ENDOSCOPIC IMPRESSION: 1.   The mucosa of the esophagus appeared normal; Regular Z line 2.   The mucosa of the stomach appeared normal; biopsies were taken in the antrum and angularis 3.   Mild duodenal inflammation was found in the duodenal bulb; otherwise normal examined duodenum  RECOMMENDATIONS: 1.  Await pathology results 2.  Follow-up of helicobacter pylori status, treat if indicated  eSigned:  Beverley Fiedler, MD 05/10/2013 3:28 PM  CC:The Patient and Almond Lint, MD, Merceda Elks, MD

## 2013-05-11 ENCOUNTER — Telehealth: Payer: Self-pay | Admitting: *Deleted

## 2013-05-11 NOTE — Telephone Encounter (Signed)
Left message that we called for f/u 

## 2013-05-15 ENCOUNTER — Telehealth (INDEPENDENT_AMBULATORY_CARE_PROVIDER_SITE_OTHER): Payer: Self-pay

## 2013-05-15 NOTE — Telephone Encounter (Signed)
Pt is calling b/c she is scheduled to get a MRI but the pt is wanting the open MRI. [facility] told the pt that the MRI that she is set up for will scan her from the head to her knees. The pt stated that she requested an open MRI b/c she is clostophobic. [facility] advised pt to call us to get something called in for her nerves but the pt wants to speak to [facility] about the open MRI. Please advise.

## 2013-05-15 NOTE — Telephone Encounter (Signed)
I am happy for her to be switched to the open MRI.  Please arrange.  Tx. FB

## 2013-05-16 ENCOUNTER — Encounter: Payer: Self-pay | Admitting: Internal Medicine

## 2013-05-16 ENCOUNTER — Telehealth (INDEPENDENT_AMBULATORY_CARE_PROVIDER_SITE_OTHER): Payer: Self-pay

## 2013-05-16 NOTE — Telephone Encounter (Signed)
Pt's MRI is scheduled in their larger machine.  It is their "open" MRI but it still extends over the pt's head.  If she would like something to relax her before the exam please let us know so we can ask Dr. Donell Beers.

## 2013-05-16 NOTE — Telephone Encounter (Signed)
Pt returned call.  Relayed below message.  Pt states that she would like Dr. Donell Beers to prescribe something to help her relax.  Says that the tech from the imaging location called and told her that the MRI machine will still be "2-3 inches from her face" pt does not like this and thinks she needs to take something.

## 2013-05-17 ENCOUNTER — Other Ambulatory Visit: Payer: Self-pay | Admitting: Internal Medicine

## 2013-05-17 ENCOUNTER — Other Ambulatory Visit: Payer: Self-pay | Admitting: *Deleted

## 2013-05-17 ENCOUNTER — Telehealth (INDEPENDENT_AMBULATORY_CARE_PROVIDER_SITE_OTHER): Payer: Self-pay

## 2013-05-17 DIAGNOSIS — B20 Human immunodeficiency virus [HIV] disease: Secondary | ICD-10-CM

## 2013-05-17 MED ORDER — EMTRICITABINE-TENOFOVIR DF 200-300 MG PO TABS: 1.0000 | ORAL_TABLET | Freq: Every day | ORAL | Status: DC

## 2013-05-17 MED ORDER — DARUNAVIR ETHANOLATE 800 MG PO TABS: 800.0000 mg | ORAL_TABLET | Freq: Every day | ORAL | Status: DC

## 2013-05-17 MED ORDER — DOLUTEGRAVIR SODIUM 50 MG PO TABS: 50.0000 mg | ORAL_TABLET | Freq: Every day | ORAL | Status: DC

## 2013-05-17 MED ORDER — RITONAVIR 100 MG PO TABS: 100.0000 mg | ORAL_TABLET | Freq: Every day | ORAL | Status: DC

## 2013-05-17 NOTE — Telephone Encounter (Signed)
Will call Valium 5 mg #2 to pharmacy of her choice.  Which pharmacy?

## 2013-05-17 NOTE — Telephone Encounter (Signed)
Patient called back to state she only uses [facility].  The other pharmacies were deleted off patient's chart per her request.  Explained that I would let Cyndra Numbers CMA know.

## 2013-05-17 NOTE — Telephone Encounter (Signed)
Will call [name], 5mg  #2 to pharmacy of her choice.  Which pharmacy?

## 2013-05-17 NOTE — Telephone Encounter (Signed)
Valium 5mg  #2 w/ no refill called to [facility].  Pt made aware.

## 2013-05-17 NOTE — Telephone Encounter (Signed)
Pt's Rx for Valium 5mg  called in to [facility] on [address].  Rx at [facility] cancelled.  Per pt's request.

## 2013-05-18 ENCOUNTER — Ambulatory Visit
Admission: RE | Admit: 2013-05-18 | Discharge: 2013-05-18 | Disposition: A | Discharge status: Home / Self Care | Payer: Medicaid Other | Source: Ambulatory Visit | Attending: General Surgery | Admitting: General Surgery

## 2013-05-18 ENCOUNTER — Other Ambulatory Visit: Payer: Medicaid Other

## 2013-05-18 DIAGNOSIS — R160 Hepatomegaly, not elsewhere classified: Secondary | ICD-10-CM

## 2013-05-18 MED ORDER — GADOBENATE DIMEGLUMINE 529 MG/ML IV SOLN
17.0000 mL | Freq: Once | INTRAVENOUS | Status: AC | PRN
  Administered 2013-05-18: 17 mL via INTRAVENOUS

## 2013-05-21 ENCOUNTER — Ambulatory Visit: Payer: Medicaid Other

## 2013-05-23 ENCOUNTER — Encounter (INDEPENDENT_AMBULATORY_CARE_PROVIDER_SITE_OTHER): Payer: Self-pay | Admitting: General Surgery

## 2013-05-23 ENCOUNTER — Encounter (INDEPENDENT_AMBULATORY_CARE_PROVIDER_SITE_OTHER): Payer: Medicaid Other | Admitting: General Surgery

## 2013-05-23 ENCOUNTER — Other Ambulatory Visit (INDEPENDENT_AMBULATORY_CARE_PROVIDER_SITE_OTHER): Payer: Self-pay | Admitting: General Surgery

## 2013-05-23 ENCOUNTER — Telehealth (INDEPENDENT_AMBULATORY_CARE_PROVIDER_SITE_OTHER): Payer: Self-pay | Admitting: *Deleted

## 2013-05-23 NOTE — Telephone Encounter (Signed)
Patient called to ask for her MRI results.  I don't see where any comments have been made or interpretations of the MRI. Explained that I would send a message to Dr. Donell Beers to ask about this.  Patient states understanding and agreeable.

## 2013-05-30 ENCOUNTER — Other Ambulatory Visit (INDEPENDENT_AMBULATORY_CARE_PROVIDER_SITE_OTHER): Payer: Self-pay | Admitting: General Surgery

## 2013-06-05 ENCOUNTER — Ambulatory Visit (INDEPENDENT_AMBULATORY_CARE_PROVIDER_SITE_OTHER): Payer: Medicaid Other | Admitting: General Surgery

## 2013-06-05 ENCOUNTER — Encounter (INDEPENDENT_AMBULATORY_CARE_PROVIDER_SITE_OTHER): Payer: Self-pay | Admitting: General Surgery

## 2013-06-05 VITALS — BP 134/82 | HR 71 | Temp 97.8°F | Resp 16 | Ht 64.0 in | Wt 185.8 lb

## 2013-06-05 DIAGNOSIS — R160 Hepatomegaly, not elsewhere classified: Secondary | ICD-10-CM

## 2013-06-05 DIAGNOSIS — K769 Liver disease, unspecified: Secondary | ICD-10-CM

## 2013-06-05 NOTE — Patient Instructions (Signed)
Whatever the operative findings, I will discuss the case with your family after we are done in the operating room.  I will talk to you in the next few days when you are more awake.  I will see you in the hospital every weekday that I am not out of town.  My partners help see patients on the weekends and if I am out of town.    WHAT HAPPENS AFTER SURGERY: After surgery, you will go first to the recovery room, then to the ICU for careful monitoring.  It is important that I am able to know your vital signs and urine output to see how you are doing.  Depending on many factors, you may be in the ICU overnight, or for several days.  You will have many tubes and lines in place which is standard for this operation.  You will have several IVs, including possibly a central line in your chest or neck.  You may have an arterial line in your wrist.  You will have a tube in your nose for 4-5 days in order to suction out the stomach and liver secretions to help the surgical connections heal.  YOU WILL NOT BE ABLE TO EAT FOR AT LEAST 4-5 DAYS AFTER SURGERY.  You will have a catheter in your bladder.  On your abdomen, you will have several surgical drains and possibly a pain pump with numbing medicine.  You will have compression stockings on your legs to decrease the risk of blood clots.    Sometimes anesthesia makes a decision that you may need to be left on the breathing machine for a period after surgery.  This may be based on your overall health status, or events during surgery.    We will address your pain in several ways.  We will use an IV pain pump called a "PCA," or Patient Controlled Analgesia.  This allows you to press a button and immediately receive a dose of pain medication without waiting for a nurse.  We also use IV Tylenol and sometimes IV Toradol which is similar to ibuprofen.  You may also have a pump with numbing medicine delivered directly to your incision.  I use doses and medications that work for the  majority of people, but you may need an adjustment to the dose or type of medicine if your pain is not adequately controlled.  Your throat may be sore, in which case you may need a throat spray or lozenges.    We will ask you to get out of bed the day after surgery in order to maximize your chances of not having complications.  Your risk of pneumonia and blood clots is lower with walking and sitting in the chair.  We will also ask you to perform breathing exercises.  We will also ask to you walk in your room and in the halls for the above reasons, but also in order for you to keep up your strength.    EATING: We will usually start you on clear liquids in around 2 days if your bowel function seems to have returned.  We advance your diet slowly to make sure you are tolerating each step.  All patients do not have a normal appetite when they go home and usually have to take 2-4 cans of nutritional supplement per day while this is improving.  Most patients also find that their taste buds do not seem the same right after surgery, and this can continue into the time of  possible post operative chemotherapy and radiation.  Some patients develop diabetes and will need assistance from a primary care doctor for medication.     GOING HOME! Usually you are able to go home in 5-10 days, depending on whether or not complications happen and what is going on with your overall health status.   If you have more health problems or if you have limited help at home, the therapists and nurses may recommend a temporary rehab or nursing facility to help you get back on your feet before you go home.  These decisions would be made while you are in the hospital with the assistance of a social worker or case manager.    Please bring all insurance/disability forms to our office for the staff to fill out   POSSIBLE COMPLICATIONS This is a very extensive operation and includes complications listed below: Bleeding Infection and  possible wound complications such as hernia Damage to adjacent structures Leak of bile from the surface of the liver Possible need for other procedures, such as abscess drains in radiology or endoscopy.   Possible prolonged hospital stay MOST PATIENTS' ENERGY LEVEL IS NOT BACK TO NORMAL FOR 2-3 months.  OLDER PATIENTS MAY FEEL WEAK FOR LONGER PERIODS OF TIME.   Difficulty with eating or post operative nausea (around 30%) Possible early recurrence of cancer Possible complications of your medical problems such as heart disease or arrhythmias. Death (less than 2%)  All possible complications are not listed, just the most common.    FURTHER INFORMATION? Please ask questions if you find something that we did not discuss in the office and would like more information.  If you would like another appointment if you have many questions or if your family members would like to come as well, please contact the office.     IF YOU ARE TAKING ASPIRIN, PLAVIX, COUMADIN, OR OTHER BLOOD THINNERS, LET us KNOW IMMEDIATELY SO WE CAN CONTACT YOUR PRESCRIBING HEALTH CARE PROVIDER TO HOLD THE MEDICATION FOR 5-7 DAYS BEFORE SURGERY

## 2013-06-06 ENCOUNTER — Encounter: Payer: Self-pay | Admitting: Neurology

## 2013-06-06 ENCOUNTER — Ambulatory Visit (INDEPENDENT_AMBULATORY_CARE_PROVIDER_SITE_OTHER): Payer: Medicaid Other | Admitting: Neurology

## 2013-06-06 VITALS — BP 117/77 | HR 81 | Ht 65.0 in | Wt 186.0 lb

## 2013-06-06 DIAGNOSIS — G5602 Carpal tunnel syndrome, left upper limb: Secondary | ICD-10-CM

## 2013-06-06 DIAGNOSIS — G5601 Carpal tunnel syndrome, right upper limb: Secondary | ICD-10-CM

## 2013-06-06 DIAGNOSIS — G5600 Carpal tunnel syndrome, unspecified upper limb: Secondary | ICD-10-CM
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
## 2013-06-06 NOTE — Patient Instructions (Signed)
Overall you are doing fairly well but I do want to suggest a few things today:   Remember to drink plenty of fluid, eat healthy meals and do not skip any meals. Try to eat protein with a every meal and eat a healthy snack such as fruit or nuts in between meals. Try to keep a regular sleep-wake schedule and try to exercise daily, particularly in the form of walking, 20-30 minutes a day, if you can.   I suggest you use bilateral wrist extension splints to help relieve some of your symptoms  As far as diagnostic testing: I would like to do an EMG/NCS to check the progression of your symptoms.  We will contact you once the EMG/NCS is completed  My clinical assistant and will answer any of your questions and relay your messages to me and also relay most of my messages to you.   Our phone number is [phone]. We also have an after hours call service for urgent matters and there is a physician on-call for urgent questions. For any emergencies you know to call 911 or go to the nearest emergency room

## 2013-06-11 ENCOUNTER — Ambulatory Visit: Payer: Medicaid Other | Admitting: Dietician

## 2013-06-14 ENCOUNTER — Ambulatory Visit (INDEPENDENT_AMBULATORY_CARE_PROVIDER_SITE_OTHER): Payer: Medicaid Other | Admitting: Neurology

## 2013-06-14 ENCOUNTER — Encounter (INDEPENDENT_AMBULATORY_CARE_PROVIDER_SITE_OTHER): Payer: Self-pay | Admitting: Radiology

## 2013-06-14 DIAGNOSIS — G5601 Carpal tunnel syndrome, right upper limb: Secondary | ICD-10-CM

## 2013-06-14 DIAGNOSIS — Z0289 Encounter for other administrative examinations: Secondary | ICD-10-CM

## 2013-06-14 DIAGNOSIS — G5600 Carpal tunnel syndrome, unspecified upper limb: Secondary | ICD-10-CM

## 2013-06-14 DIAGNOSIS — G5602 Carpal tunnel syndrome, left upper limb: Secondary | ICD-10-CM

## 2013-06-14 NOTE — Procedures (Signed)
  HISTORY:  [name] is a 32 year old patient with a several year history of right greater than left hand discomfort. More recently, she has had discomfort involving the right hand, wrist, and forearm. The patient denies significant neck or shoulder discomfort. The patient is being evaluated for a possible neuropathy or a cervical radiculopathy.  NERVE CONDUCTION STUDIES:  Nerve conduction studies were performed on both upper extremities. The distal motor latencies and motor amplitudes for the median and ulnar nerves were within normal limits. The F wave latencies and nerve conduction velocities for these nerves were also normal. The sensory latencies for the median, radial, and ulnar nerves were normal.   EMG STUDIES:  EMG study was performed on the right upper extremity:  The first dorsal interosseous muscle reveals 2 to 4 K units with full recruitment. No fibrillations or positive waves were noted. The abductor pollicis brevis muscle reveals 2 to 5 K units with decreased recruitment. No fibrillations or positive waves were noted. The extensor indicis proprius muscle reveals 1 to 3 K units with full recruitment. No fibrillations or positive waves were noted. The pronator teres muscle reveals 2 to 3 K units with full recruitment. No fibrillations or positive waves were noted. The biceps muscle reveals 1 to 2 K units with full recruitment. No fibrillations or positive waves were noted. The triceps muscle reveals 2 to 4 K units with full recruitment. No fibrillations or positive waves were noted. The anterior deltoid muscle reveals 2 to 3 K units with full recruitment. No fibrillations or positive waves were noted. The cervical paraspinal muscles were tested at 2 levels. No abnormalities of insertional activity were seen at either level tested. There was good relaxation.   IMPRESSION:  Nerve conduction studies done on both upper extremities were within normal limits. There is no  evidence of carpal tunnel syndrome on either side. EMG evaluation of the right upper extremity shows chronic stable findings of denervation in the right APB muscle consistent with a healed right carpal tunnel syndrome. There is no evidence of an overlying cervical radiculopathy.  Marlan Palau MD 06/14/2013 1:41 PM  [facility] [address] [place], [place] [address]  Phone [phone] Fax [phone]

## 2013-06-17 NOTE — Progress Notes (Signed)
HISTORY: Pt is a 32 yo F with a history of HIV and ruptured dermoid cyst.  She more recently had an unruptured ovarian dermoid, but her first one was ruptured.  There was no upper abdominal imaging in between the two to document the appearance of the liver.  She presented earlier with liver masses and a cavitary lung lesion.  In discussing her case with radiology, this appears classic for recurrent dermoid.  The liver masses appear to be on the surface.  MRI agrees.     PERTINENT REVIEW OF SYSTEMS: Otherwise negative x 11.  Filed Vitals:   06/05/13 1445  BP: 134/82  Pulse: 71  Temp: 97.8 F (36.6 C)  Resp: 16   Filed Weights   06/05/13 1445  Weight: 185 lb 12.8 oz (84.278 kg)     EXAM: Head: Normocephalic and atraumatic.  Eyes:  Conjunctivae are normal. Pupils are equal, round, and reactive to light. No scleral icterus.  Resp: No respiratory distress, normal effort. Abd:  Abdomen is soft, non distended and non tender. No masses are palpable.  There is no rebound and no guarding.  Lower midline incision is without hernia.    Neurological: Alert and oriented to person, place, and time. Coordination normal.  Skin: Skin is warm and dry. No rash noted. No diaphoretic. No erythema. No pallor.  Psychiatric: Normal mood and affect. Normal behavior. Judgment and thought content normal.      ASSESSMENT AND PLAN:   Superficial Liver masses, right lobe; suspected dermoid recurrence.   Discussed surgery.   Will plan attempted laparoscopic removal. May have to open since masses are very posterior.    Discussed risks including dermoid recurrence, bleeding, infection, bile leak, conversion to open operation, blood transfusion, blood clot, and more.    She wishes to proceed.        Schyler Butikofer L Meher Kucinski, MD Surgical Oncology, General & Endocrine Surgery [facility]  BLAND,VEITA J, MD Cornett, Lansdale A., MD   

## 2013-06-17 NOTE — Assessment & Plan Note (Signed)
Discussed surgery.   Will plan attempted laparoscopic removal. May have to open since masses are very posterior.    Discussed risks including dermoid recurrence, bleeding, infection, bile leak, conversion to open operation, blood transfusion, blood clot, and more.    She wishes to proceed.

## 2013-06-18 ENCOUNTER — Encounter (HOSPITAL_COMMUNITY): Payer: Self-pay | Admitting: Pharmacist

## 2013-06-19 ENCOUNTER — Encounter: Payer: Self-pay | Admitting: Internal Medicine

## 2013-06-19 ENCOUNTER — Telehealth: Payer: Self-pay | Admitting: *Deleted

## 2013-06-19 ENCOUNTER — Ambulatory Visit (INDEPENDENT_AMBULATORY_CARE_PROVIDER_SITE_OTHER): Payer: Medicaid Other | Admitting: Internal Medicine

## 2013-06-19 VITALS — BP 113/75 | HR 91 | Temp 98.2°F | Ht 63.0 in | Wt 185.0 lb

## 2013-06-19 DIAGNOSIS — J984 Other disorders of lung: Secondary | ICD-10-CM

## 2013-06-19 NOTE — Telephone Encounter (Signed)
Reminder to schedule patient for a repeat CT in January 2015 per radiology. [name]

## 2013-06-19 NOTE — Assessment & Plan Note (Signed)
Her cavitary lesion on the MRI of the abdomen which did show left lower lobe has significantly increased from 0.9 cm to 1.4 cm. She is otherwise no asymptomatic. Differential is broad. In going to have her sent to pulmonary for further evaluation if bronchoscopy would be indicated and possible way of getting an organism. Otherwise she has followup in December for her HIV labs.

## 2013-06-19 NOTE — Progress Notes (Signed)
  Subjective:    Patient ID: [name], female    DOB: 03/09/1981, 32 y.o.   MRN: [id number]  HPI He comes in for followup of her cavitary lung lesion. She has a history of HIV with previous noncompliance but has recently been doing very well on a salvage regimen with Prezista, Norvir, tivicay and Truvada. He denies any missed doses of that. She was noted to have a cavitary lesion on the CT scan previously done in September while she was being evaluated for a dermoid cyst in her liver. She really has had no symptoms related to the lung cavitary lesion that is in her left lower lobe. She then had a followup MRI of her liver and abdomen which did note on the left lower lobe of her lungs since it is low enough that the cavitary lesion had increased in size by about a half a centimeter. She otherwise has had no fever or chills. She has some upper respiratory symptoms and sinus discomfort that is being managed by her primary physician. She has not had no productive cough. She has had no travel out of the country. She did have a negative culture for urine.   Review of Systems  Constitutional: Negative for fever, chills, appetite change, fatigue and unexpected weight change.  Eyes: Negative for visual disturbance.  Respiratory: Negative for shortness of breath.        Dry cough particularly with postnasal drip  Cardiovascular: Negative for leg swelling.  Gastrointestinal: Negative for nausea and diarrhea.  Musculoskeletal: Negative for myalgias.  Skin: Negative for rash.  Neurological: Negative for dizziness, light-headedness and headaches.  Hematological: Negative for adenopathy.  Psychiatric/Behavioral: Negative for dysphoric mood.       Objective:   Physical Exam  Constitutional: She appears well-developed and well-nourished. No distress.  HENT:  Mouth/Throat: No oropharyngeal exudate.  Eyes: No scleral icterus.  Cardiovascular: Normal rate, regular rhythm and normal heart sounds.   No  murmur heard. Pulmonary/Chest: Effort normal and breath sounds normal. No respiratory distress.  Lymphadenopathy:    She has no cervical adenopathy.  Neurological: She is alert.  Skin: No rash noted.  Psychiatric: She has a normal mood and affect. Her behavior is normal.          Assessment & Plan:

## 2013-06-21 ENCOUNTER — Inpatient Hospital Stay (HOSPITAL_COMMUNITY): Admission: RE | Admit: 2013-06-21 | Payer: Medicaid Other | Source: Ambulatory Visit

## 2013-06-25 ENCOUNTER — Encounter: Payer: Self-pay | Admitting: Internal Medicine

## 2013-06-25 ENCOUNTER — Ambulatory Visit (INDEPENDENT_AMBULATORY_CARE_PROVIDER_SITE_OTHER): Payer: Medicaid Other | Admitting: Internal Medicine

## 2013-06-25 VITALS — BP 118/80 | HR 89 | Temp 98.7°F | Ht 64.0 in | Wt 187.6 lb

## 2013-06-25 DIAGNOSIS — J45991 Cough variant asthma: Secondary | ICD-10-CM

## 2013-06-25 DIAGNOSIS — J852 Abscess of lung without pneumonia: Secondary | ICD-10-CM

## 2013-06-25 MED ORDER — AMOXICILLIN-POT CLAVULANATE 875-125 MG PO TABS: 1.0000 | ORAL_TABLET | Freq: Two times a day (BID) | ORAL | Status: DC

## 2013-06-25 NOTE — Patient Instructions (Addendum)
Augmentin 875 mg take one pill twice daily  X 10 days - take at breakfast and supper with large glass of water.  It would help reduce the usual side effects (diarrhea and yeast infections) if you ate cultured yogurt at lunch.   If still nasty mucus then need to turn in mucus while in the hospital for routine cultures, afb and fungal to be complete

## 2013-06-25 NOTE — Progress Notes (Signed)
Subjective:    Patient ID: [name], female    DOB: 1981/07/30  MRN: [id number]  HPI  32 yobf never smoker problems with asthma and sinus infections since age 32 dx'd with HIV 2008 and noted a change in pattern of sinus infections around 2012 to where more often and more severe referred 06/25/2013 to pulmonary clinic by Dr Luciana Axe for abn ct chest.  06/25/2013 1st [facility] office visit/ Jenney Brester cc freq stuffy nose and , variably coughing up mustard colored mucus x 3 weeks indolent onset progressive and no abx to date, worst in am's, assoc with doe but not at rest, and mild generalized chest tightness s pleuritic component.   Started on qvar summer 2014 and much less need for saba since then   No obvious pattern in  day to day or daytime variabilty or  subjective wheeze overt  hb symptoms. No unusual exp hx or h/o childhood pna/ asthma or knowledge of premature birth.  Sleeping ok without nocturnal  or early am exacerbation  of respiratory  c/o's or need for noct saba. Also denies any obvious fluctuation of symptoms with weather or environmental changes or other aggravating or alleviating factors except as outlined above   Current Medications, Allergies, Complete Past Medical History, Past Surgical History, Family History, and Social History were reviewed in [employer] record.  ROS  The following are not active complaints unless bolded sore throat, dysphagia, dental problems, itching, sneezing,  nasal congestion or excess/ purulent secretions, ear ache,   fever, chills, sweats, unintended wt loss, pleuritic or exertional cp, hemoptysis,  orthopnea pnd or leg swelling, presyncope, palpitations, heartburn, abdominal pain, anorexia, nausea, vomiting, diarrhea  or change in bowel or urinary habits, change in stools or urine, dysuria,hematuria,  rash, arthralgias, visual complaints, headache, numbness weakness or ataxia or problems with walking or coordination,   change in mood/affect or memory.         Review of Systems  Constitutional: Negative for fever and unexpected weight change.  HENT: Positive for postnasal drip, sinus pressure and sneezing. Negative for congestion, dental problem, ear pain, nosebleeds, rhinorrhea, sore throat and trouble swallowing.   Eyes: Negative for redness and itching.  Respiratory: Positive for cough, chest tightness and shortness of breath. Negative for wheezing.   Cardiovascular: Negative for palpitations and leg swelling.  Gastrointestinal: Negative for nausea and vomiting.  Genitourinary: Negative for dysuria.  Musculoskeletal: Negative for joint swelling.  Skin: Negative for rash.  Neurological: Positive for headaches.  Hematological: Does not bruise/bleed easily.  Psychiatric/Behavioral: Positive for dysphoric mood. The patient is nervous/anxious.        Objective:   Physical Exam  amb bf somewhat evasive with responses Wt Readings from Last 3 Encounters:  06/25/13 187 lb 9.6 oz (85.095 kg)  06/19/13 185 lb (83.915 kg)  06/06/13 186 lb (84.369 kg)      HEENT: poor dentition with multiple caries , turbinates, and orophanx. Nl external ear canals without cough reflex   NECK :  without JVD/Nodes/TM/ nl carotid upstrokes bilaterally   LUNGS: no acc muscle use, clear to A and P bilaterally without cough on insp or exp maneuvers No amphoric bs   CV:  RRR  no s3 or murmur or increase in P2, no edema   ABD:  soft and nontender with nl excursion in the supine position. No bruits or organomegaly, bowel sounds nl  MS:  warm without deformities, calf tenderness, cyanosis or clubbing  SKIN: warm and dry without  lesions    NEURO:  alert, approp, no deficits     Ct  04/24/13 Tiny cavitary nodule in sup segment of LLL         Assessment & Plan:

## 2013-06-26 ENCOUNTER — Telehealth (INDEPENDENT_AMBULATORY_CARE_PROVIDER_SITE_OTHER): Payer: Self-pay

## 2013-06-26 ENCOUNTER — Encounter (HOSPITAL_COMMUNITY): Payer: Self-pay

## 2013-06-26 ENCOUNTER — Other Ambulatory Visit (HOSPITAL_COMMUNITY): Payer: Self-pay | Admitting: *Deleted

## 2013-06-26 ENCOUNTER — Encounter (HOSPITAL_COMMUNITY)
Admission: RE | Admit: 2013-06-26 | Discharge: 2013-06-26 | Disposition: A | Discharge status: Home / Self Care | Payer: Medicaid Other | Source: Ambulatory Visit | Attending: Anesthesiology | Admitting: Anesthesiology

## 2013-06-26 ENCOUNTER — Encounter (HOSPITAL_COMMUNITY)
Admission: RE | Admit: 2013-06-26 | Discharge: 2013-06-26 | Disposition: A | Discharge status: Home / Self Care | Payer: Medicaid Other | Source: Ambulatory Visit | Attending: General Surgery | Admitting: General Surgery

## 2013-06-26 DIAGNOSIS — J852 Abscess of lung without pneumonia: Secondary | ICD-10-CM | POA: Insufficient documentation

## 2013-06-26 HISTORY — DX: Headache: R51

## 2013-06-26 LAB — CBC WITH DIFFERENTIAL/PLATELET
Basophils Absolute: 0.1 10*3/uL (ref 0.0–0.1)
Basophils Relative: 0 % (ref 0–1)
Eosinophils Absolute: 0.2 10*3/uL (ref 0.0–0.7)
Eosinophils Relative: 1 % (ref 0–5)
HCT: 39.8 % (ref 36.0–46.0)
Lymphocytes Relative: 35 % (ref 12–46)
MCH: 32.9 pg (ref 26.0–34.0)
MCHC: 34.9 g/dL (ref 30.0–36.0)
MCV: 94.3 fL (ref 78.0–100.0)
Neutro Abs: 7 10*3/uL (ref 1.7–7.7)
Neutrophils Relative %: 59 % (ref 43–77)
Platelets: 313 10*3/uL (ref 150–400)
RDW: 13.4 % (ref 11.5–15.5)
WBC: 11.9 10*3/uL — ABNORMAL HIGH (ref 4.0–10.5)

## 2013-06-26 LAB — COMPREHENSIVE METABOLIC PANEL
ALT: 13 U/L (ref 0–35)
AST: 14 U/L (ref 0–37)
Albumin: 3.7 g/dL (ref 3.5–5.2)
BUN: 14 mg/dL (ref 6–23)
Calcium: 9.3 mg/dL (ref 8.4–10.5)
GFR calc Af Amer: 82 mL/min — ABNORMAL LOW (ref >90)
Sodium: 138 mEq/L (ref 135–145)
Total Bilirubin: 0.1 mg/dL — ABNORMAL LOW (ref 0.3–1.2)
Total Protein: 7.7 g/dL (ref 6.0–8.3)

## 2013-06-26 LAB — HCG, SERUM, QUALITATIVE: Preg, Serum: NEGATIVE

## 2013-06-26 LAB — ABO/RH: ABO/RH(D): O POS

## 2013-06-26 LAB — PROTIME-INR: INR: 0.92 (ref 0.00–1.49)

## 2013-06-26 NOTE — Telephone Encounter (Signed)
Would do one enema night before surgery in order to decrease constipation post op.

## 2013-06-26 NOTE — Assessment & Plan Note (Signed)
Adequate control on present rx, reviewed > no change in rx needed   

## 2013-06-26 NOTE — Assessment & Plan Note (Signed)
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  If able to obtain sputum for afb and fungal to be complete would do so given HIV status but defer this to Dr Comer's capable hands  Fob/ bx in this location is extremely problematic as the area is at the apex of the sup segment of the LLL  See instructions for specific recommendations which were reviewed directly with the patient who was given a copy with highlighter outlining the key components.

## 2013-06-26 NOTE — Telephone Encounter (Signed)
Pre admit called asking if patient needs a fleets enema morning of surgery. If so patient is not aware she needs this as part of pre op prep. Advised we will check with Dr Donell Beers on prep instructions and would call patient afterwards.

## 2013-06-26 NOTE — Telephone Encounter (Signed)
Pt states she just paid all her bills and does not have money to purchase the enema.

## 2013-06-26 NOTE — Pre-Procedure Instructions (Addendum)
[name]  06/26/2013   Your procedure is scheduled on:  06/28/13  Report to [facility] short stay admitting at 900 AM.  Call this number if you have problems the morning of surgery: [phone]  Remember:   Do not eat food or drink liquids after midnight.   Take these medicines the morning of surgery with A SIP OF WATER: inhalers if need, singulair,augmentin,omeprazole, abilify,antivirals (prezista,norvir,truvada,tivicay),cymbalta. lithium   Do not wear jewelry, make-up or nail polish.  Do not wear lotions, powders, or perfumes. You may wear deodorant.  Do not shave 48 hours prior to surgery. Men may shave face and neck.  Do not bring valuables to the hospital.  [facility] is not responsible                  for any belongings or valuables.               Contacts, dentures or bridgework may not be worn into surgery.  Leave suitcase in the car. After surgery it may be brought to your room.  For patients admitted to the hospital, discharge time is determined by your                treatment team.               Patients discharged the day of surgery will not be allowed to drive  home.  Name and phone number of your driver:   Special Instructions: Shower using CHG 2 nights before surgery and the night before surgery.  If you shower the day of surgery use CHG.  Use special wash - you have one bottle of CHG for all showers.  You should use approximately 1/3 of the bottle for each shower.   Please read over the following fact sheets that you were given: Pain Booklet, Coughing and Deep Breathing, Blood Transfusion Information and Surgical Site Infection Prevention

## 2013-06-26 NOTE — Progress Notes (Signed)
Office called re: fleets  Patient did not known about---

## 2013-06-27 MED ORDER — DEXTROSE 5 % IV SOLN
2.0000 g | INTRAVENOUS | Status: AC
  Administered 2013-06-28: 2 g via INTRAVENOUS
  Filled 2013-06-27: qty 2

## 2013-06-27 NOTE — Progress Notes (Signed)
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 Donell Beers.  History includes HIV, obesity, non-smoker, diet controlled DM2, GERD, Bipolar, anxiety, anemia.  She underwent right salpingo oophorectomy and [name] on 12/04/12.  She is being followed by pulmonologist Dr. Sandrea Hughs for finding of small LLL cavity lesion.  Per his 06/25/13 note, location is classic form aspiration syndrome either related to poor dentition, sinus disease, or both.  He recommended 10 day course of Augmentin with extension to 20 days if not completely clearing of purulent sputum or no adequate healing on follow-up CXR.  He felt biopsy in this area was extremely problematic because the area is at the apex of the superior segment of the LLL.  His note tends to indicate that he is leaving decision for cultures to ID (Dr. Luciana Axe; last visit 06/19/13). PCP is Dr. Renaye Rakers.    CXR on 06/26/13 showed: No acute abnormality noted. A previously seen cavitary lesion in the left lower lobe is not well visualized on this examination.   Chest CT on 04/24/13 showed:  1.In axial image 29 there is a somewhat cavitary lesion in the left lower lobe posteriorly measures 1.2 cm x 1.2 cm. This is best visualized in coronal image 87 and central cavitation is confirmed. Findings are suspicious for focal infectious process, or septic embolus. A cavitary neoplasm cannot be entirely excluded. Clinical correlation is necessary. Follow-up short term CT scan in 2-3 months is recommended to assure resolution after treatment. No additional pulmonary nodules are noted.  2. No segmental infiltrate or pulmonary edema. No pleural effusion.  3. No adenopathy.  4. Stable fat-containing lesions along the liver is upper abdomen.  She is scheduled to have a repeat CT in January 2015.  I don't see that an EKG was done at PAT.  Based on anesthesia's  current guidelines, she could need a preoperative EKG based on history of DM2.  Preoperative labs noted.  WBC 11.9.  I reviewed pulmonary history with anesthesiologist Dr. Katrinka Blazing and spoke with both Dr. Sherene Sires and Dr. Luciana Axe about plans for surgery and any precautions that may be needed in regards to her cavitary lesion in an HIV patient.  As Dr. Thurston Hole note indicated, he felt small cavitary lesion was likely from aspiration syndrome.  He had a low suspicion for TB, but he recommended addidtional input from Dr. Luciana Axe who did not think that any additional precautions were needed.  I'll send staff message to Dr. Donell Beers with update.  Velna Ochs [facility]/Anesthesiology Phone [phone] 06/27/2013 1:41 PM

## 2013-06-28 ENCOUNTER — Encounter (HOSPITAL_COMMUNITY)
Admission: RE | Disposition: A | Discharge status: Home / Self Care | Payer: Self-pay | Source: Ambulatory Visit | Attending: General Surgery

## 2013-06-28 ENCOUNTER — Ambulatory Visit (HOSPITAL_COMMUNITY): Payer: Medicaid Other | Admitting: Anesthesiology

## 2013-06-28 ENCOUNTER — Encounter (HOSPITAL_COMMUNITY): Payer: Self-pay | Admitting: General Surgery

## 2013-06-28 ENCOUNTER — Encounter (HOSPITAL_COMMUNITY): Payer: Medicaid Other | Admitting: Vascular Surgery

## 2013-06-28 ENCOUNTER — Ambulatory Visit (HOSPITAL_COMMUNITY)
Admission: RE | Admit: 2013-06-28 | Discharge: 2013-06-30 | Disposition: A | Discharge status: Home / Self Care | Payer: Medicaid Other | Source: Ambulatory Visit | Attending: General Surgery | Admitting: General Surgery

## 2013-06-28 DIAGNOSIS — Z01818 Encounter for other preprocedural examination: Secondary | ICD-10-CM | POA: Insufficient documentation

## 2013-06-28 DIAGNOSIS — F319 Bipolar disorder, unspecified: Secondary | ICD-10-CM | POA: Insufficient documentation

## 2013-06-28 DIAGNOSIS — E119 Type 2 diabetes mellitus without complications: Secondary | ICD-10-CM | POA: Insufficient documentation

## 2013-06-28 DIAGNOSIS — Z79899 Other long term (current) drug therapy: Secondary | ICD-10-CM | POA: Insufficient documentation

## 2013-06-28 DIAGNOSIS — R160 Hepatomegaly, not elsewhere classified: Secondary | ICD-10-CM | POA: Diagnosis present

## 2013-06-28 DIAGNOSIS — Z21 Asymptomatic human immunodeficiency virus [HIV] infection status: Secondary | ICD-10-CM | POA: Insufficient documentation

## 2013-06-28 DIAGNOSIS — K219 Gastro-esophageal reflux disease without esophagitis: Secondary | ICD-10-CM | POA: Insufficient documentation

## 2013-06-28 DIAGNOSIS — K7689 Other specified diseases of liver: Secondary | ICD-10-CM

## 2013-06-28 DIAGNOSIS — J45909 Unspecified asthma, uncomplicated: Secondary | ICD-10-CM | POA: Insufficient documentation

## 2013-06-28 DIAGNOSIS — Z01812 Encounter for preprocedural laboratory examination: Secondary | ICD-10-CM | POA: Insufficient documentation

## 2013-06-28 HISTORY — PX: LAPAROSCOPIC HEPATECTOMY: SHX5897

## 2013-06-28 LAB — GLUCOSE, CAPILLARY
Glucose-Capillary: 81 mg/dL (ref 70–99)
Glucose-Capillary: 92 mg/dL (ref 70–99)

## 2013-06-28 SURGERY — HEPATECTOMY, LAPAROSCOPIC
Anesthesia: General | Site: Abdomen | Wound class: Clean

## 2013-06-28 MED ORDER — PROMETHAZINE HCL 25 MG/ML IJ SOLN
INTRAMUSCULAR | Status: AC
  Filled 2013-06-28: qty 1

## 2013-06-28 MED ORDER — AMOXICILLIN-POT CLAVULANATE 875-125 MG PO TABS
1.0000 | ORAL_TABLET | Freq: Two times a day (BID) | ORAL | Status: DC
  Administered 2013-06-29 – 2013-06-30 (×3): 1 via ORAL
  Filled 2013-06-28 (×5): qty 1

## 2013-06-28 MED ORDER — LIDOCAINE HCL (CARDIAC) 20 MG/ML IV SOLN
INTRAVENOUS | Status: DC | PRN
  Administered 2013-06-28: 100 mg via INTRAVENOUS

## 2013-06-28 MED ORDER — ALBUTEROL SULFATE HFA 108 (90 BASE) MCG/ACT IN AERS
2.0000 | INHALATION_SPRAY | Freq: Four times a day (QID) | RESPIRATORY_TRACT | Status: DC | PRN
  Filled 2013-06-28: qty 6.7

## 2013-06-28 MED ORDER — RITONAVIR 100 MG PO TABS
100.0000 mg | ORAL_TABLET | Freq: Every day | ORAL | Status: DC
Start: 2013-06-29 — End: 2013-06-30
  Administered 2013-06-29 – 2013-06-30 (×2): 100 mg via ORAL
  Filled 2013-06-28 (×3): qty 1

## 2013-06-28 MED ORDER — OXYCODONE HCL 5 MG/5ML PO SOLN: 5.0000 mg | Freq: Once | ORAL | Status: AC | PRN

## 2013-06-28 MED ORDER — HYDROMORPHONE HCL PF 1 MG/ML IJ SOLN
INTRAMUSCULAR | Status: AC
  Administered 2013-06-28: 0.5 mg via INTRAVENOUS
  Filled 2013-06-28: qty 1

## 2013-06-28 MED ORDER — HYDROMORPHONE HCL PF 1 MG/ML IJ SOLN
0.2500 mg | INTRAMUSCULAR | Status: DC | PRN
  Administered 2013-06-28 (×4): 0.5 mg via INTRAVENOUS

## 2013-06-28 MED ORDER — SUMATRIPTAN SUCCINATE 100 MG PO TABS
100.0000 mg | ORAL_TABLET | Freq: Once | ORAL | Status: AC | PRN
  Filled 2013-06-28: qty 1

## 2013-06-28 MED ORDER — OXYCODONE HCL 5 MG PO TABS
5.0000 mg | ORAL_TABLET | Freq: Once | ORAL | Status: AC | PRN
  Administered 2013-06-28: 5 mg via ORAL

## 2013-06-28 MED ORDER — BECLOMETHASONE DIPROPIONATE 40 MCG/ACT IN AERS
2.0000 | INHALATION_SPRAY | Freq: Two times a day (BID) | RESPIRATORY_TRACT | Status: DC
  Administered 2013-06-29 – 2013-06-30 (×2): 2 via RESPIRATORY_TRACT
  Filled 2013-06-28 (×2): qty 8.7

## 2013-06-28 MED ORDER — PANTOPRAZOLE SODIUM 40 MG PO TBEC
40.0000 mg | DELAYED_RELEASE_TABLET | Freq: Every day | ORAL | Status: DC
  Administered 2013-06-29 – 2013-06-30 (×2): 40 mg via ORAL
  Filled 2013-06-28 (×2): qty 1

## 2013-06-28 MED ORDER — FENTANYL CITRATE 0.05 MG/ML IJ SOLN
INTRAMUSCULAR | Status: DC | PRN
  Administered 2013-06-28: 50 ug via INTRAVENOUS
  Administered 2013-06-28 (×2): 100 ug via INTRAVENOUS
  Administered 2013-06-28 (×4): 50 ug via INTRAVENOUS

## 2013-06-28 MED ORDER — PHENYLEPHRINE HCL 10 MG/ML IJ SOLN
INTRAMUSCULAR | Status: DC | PRN
  Administered 2013-06-28: 40 ug via INTRAVENOUS
  Administered 2013-06-28: 20 ug via INTRAVENOUS

## 2013-06-28 MED ORDER — CHLORHEXIDINE GLUCONATE 4 % EX LIQD: 1.0000 "application " | Freq: Once | CUTANEOUS | Status: DC

## 2013-06-28 MED ORDER — VALACYCLOVIR HCL 500 MG PO TABS
500.0000 mg | ORAL_TABLET | Freq: Two times a day (BID) | ORAL | Status: DC
  Administered 2013-06-28 – 2013-06-30 (×4): 500 mg via ORAL
  Filled 2013-06-28 (×5): qty 1

## 2013-06-28 MED ORDER — FLUTICASONE PROPIONATE 50 MCG/ACT NA SUSP: 2.0000 | Freq: Every day | NASAL | Status: DC

## 2013-06-28 MED ORDER — NEOSTIGMINE METHYLSULFATE 1 MG/ML IJ SOLN
INTRAMUSCULAR | Status: DC | PRN
  Administered 2013-06-28: 5 mg via INTRAVENOUS

## 2013-06-28 MED ORDER — DULOXETINE HCL 60 MG PO CPEP
60.0000 mg | ORAL_CAPSULE | Freq: Every day | ORAL | Status: DC
  Administered 2013-06-29 – 2013-06-30 (×2): 60 mg via ORAL
  Filled 2013-06-28 (×2): qty 1

## 2013-06-28 MED ORDER — BUPIVACAINE-EPINEPHRINE PF 0.25-1:200000 % IJ SOLN
INTRAMUSCULAR | Status: AC
  Filled 2013-06-28: qty 30

## 2013-06-28 MED ORDER — HYDROCORTISONE ACE-PRAMOXINE 1-1 % RE CREA: TOPICAL_CREAM | Freq: Two times a day (BID) | RECTAL | Status: DC | PRN

## 2013-06-28 MED ORDER — HEMOSTATIC AGENTS (NO CHARGE) OPTIME
TOPICAL | Status: DC | PRN
  Administered 2013-06-28: 1 via TOPICAL

## 2013-06-28 MED ORDER — LACTATED RINGERS IV SOLN
INTRAVENOUS | Status: DC | PRN
  Administered 2013-06-28 (×3): via INTRAVENOUS

## 2013-06-28 MED ORDER — FENTANYL CITRATE 0.05 MG/ML IJ SOLN: 50.0000 ug | Freq: Once | INTRAMUSCULAR | Status: DC

## 2013-06-28 MED ORDER — PROMETHAZINE HCL 25 MG/ML IJ SOLN
6.2500 mg | INTRAMUSCULAR | Status: DC | PRN
  Administered 2013-06-28: 6.25 mg via INTRAVENOUS

## 2013-06-28 MED ORDER — PROPOFOL 10 MG/ML IV BOLUS
INTRAVENOUS | Status: DC | PRN
  Administered 2013-06-28: 200 mg via INTRAVENOUS

## 2013-06-28 MED ORDER — OXYCODONE-ACETAMINOPHEN 5-325 MG PO TABS
1.0000 | ORAL_TABLET | ORAL | Status: DC | PRN
  Administered 2013-06-30 (×2): 2 via ORAL
  Filled 2013-06-28 (×2): qty 2

## 2013-06-28 MED ORDER — MONTELUKAST SODIUM 10 MG PO TABS
10.0000 mg | ORAL_TABLET | Freq: Every day | ORAL | Status: DC
  Administered 2013-06-28 – 2013-06-29 (×2): 10 mg via ORAL
  Filled 2013-06-28 (×3): qty 1

## 2013-06-28 MED ORDER — PSEUDOEPHEDRINE HCL ER 120 MG PO TB12: 120.0000 mg | ORAL_TABLET | Freq: Two times a day (BID) | ORAL | Status: DC | PRN

## 2013-06-28 MED ORDER — DARUNAVIR ETHANOLATE 800 MG PO TABS
800.0000 mg | ORAL_TABLET | Freq: Every day | ORAL | Status: DC
  Administered 2013-06-29 – 2013-06-30 (×2): 800 mg via ORAL
  Filled 2013-06-28 (×3): qty 1

## 2013-06-28 MED ORDER — ARIPIPRAZOLE 5 MG PO TABS
5.0000 mg | ORAL_TABLET | Freq: Every day | ORAL | Status: DC
Start: 2013-06-29 — End: 2013-06-30
  Administered 2013-06-29 – 2013-06-30 (×2): 5 mg via ORAL
  Filled 2013-06-28 (×2): qty 1

## 2013-06-28 MED ORDER — ACETAMINOPHEN 500 MG PO TABS
1000.0000 mg | ORAL_TABLET | Freq: Four times a day (QID) | ORAL | Status: AC
  Administered 2013-06-29 (×4): 1000 mg via ORAL
  Filled 2013-06-28 (×5): qty 2

## 2013-06-28 MED ORDER — GLYCOPYRROLATE 0.2 MG/ML IJ SOLN
INTRAMUSCULAR | Status: DC | PRN
  Administered 2013-06-28: .8 mg via INTRAVENOUS
  Administered 2013-06-28: 0.2 mg via INTRAVENOUS

## 2013-06-28 MED ORDER — EMTRICITABINE-TENOFOVIR DF 200-300 MG PO TABS
1.0000 | ORAL_TABLET | Freq: Every day | ORAL | Status: DC
  Administered 2013-06-29 – 2013-06-30 (×2): 1 via ORAL
  Filled 2013-06-28 (×2): qty 1

## 2013-06-28 MED ORDER — LITHIUM CARBONATE ER 450 MG PO TBCR
450.0000 mg | EXTENDED_RELEASE_TABLET | Freq: Every day | ORAL | Status: DC
  Administered 2013-06-29 – 2013-06-30 (×2): 450 mg via ORAL
  Filled 2013-06-28 (×2): qty 1

## 2013-06-28 MED ORDER — KCL IN DEXTROSE-NACL 20-5-0.45 MEQ/L-%-% IV SOLN
INTRAVENOUS | Status: DC
  Administered 2013-06-28: 20:00:00 via INTRAVENOUS
  Filled 2013-06-28 (×5): qty 1000

## 2013-06-28 MED ORDER — DEXTROSE 5 % IV SOLN
1.0000 g | Freq: Four times a day (QID) | INTRAVENOUS | Status: AC
  Administered 2013-06-28 – 2013-06-29 (×3): 1 g via INTRAVENOUS
  Filled 2013-06-28 (×3): qty 1

## 2013-06-28 MED ORDER — ADAPALENE 0.1 % EX GEL: 1.0000 "application " | Freq: Every day | CUTANEOUS | Status: DC

## 2013-06-28 MED ORDER — 0.9 % SODIUM CHLORIDE (POUR BTL) OPTIME
TOPICAL | Status: DC | PRN
  Administered 2013-06-28: 1000 mL

## 2013-06-28 MED ORDER — OXYCODONE HCL 5 MG PO TABS
ORAL_TABLET | ORAL | Status: AC
  Administered 2013-06-28: 5 mg via ORAL
  Filled 2013-06-28: qty 1

## 2013-06-28 MED ORDER — DOLUTEGRAVIR SODIUM 50 MG PO TABS
50.0000 mg | ORAL_TABLET | Freq: Every day | ORAL | Status: DC
  Administered 2013-06-29 – 2013-06-30 (×2): 50 mg via ORAL
  Filled 2013-06-28 (×2): qty 1

## 2013-06-28 MED ORDER — CEFAZOLIN SODIUM-DEXTROSE 2-3 GM-% IV SOLR: 2.0000 g | INTRAVENOUS | Status: DC

## 2013-06-28 MED ORDER — ONDANSETRON HCL 4 MG/2ML IJ SOLN: 4.0000 mg | Freq: Four times a day (QID) | INTRAMUSCULAR | Status: DC | PRN

## 2013-06-28 MED ORDER — LORATADINE 10 MG PO TABS
10.0000 mg | ORAL_TABLET | Freq: Every day | ORAL | Status: DC
  Administered 2013-06-29 – 2013-06-30 (×2): 10 mg via ORAL
  Filled 2013-06-28 (×2): qty 1

## 2013-06-28 MED ORDER — TRAMADOL HCL 50 MG PO TABS
50.0000 mg | ORAL_TABLET | Freq: Four times a day (QID) | ORAL | Status: DC | PRN
  Administered 2013-06-28 – 2013-06-29 (×2): 100 mg via ORAL
  Filled 2013-06-28 (×3): qty 2

## 2013-06-28 MED ORDER — ONDANSETRON HCL 4 MG/2ML IJ SOLN
INTRAMUSCULAR | Status: DC | PRN
  Administered 2013-06-28: 4 mg via INTRAVENOUS

## 2013-06-28 MED ORDER — FLUNISOLIDE 29 MCG/ACT NA SOLN: 2.0000 | Freq: Every day | NASAL | Status: DC

## 2013-06-28 MED ORDER — AMITRIPTYLINE HCL 25 MG PO TABS
25.0000 mg | ORAL_TABLET | Freq: Every day | ORAL | Status: DC
  Administered 2013-06-28: 25 mg via ORAL
  Filled 2013-06-28 (×3): qty 1

## 2013-06-28 MED ORDER — BUPIVACAINE 0.25 % ON-Q PUMP DUAL CATH 300 ML
300.0000 mL | INJECTION | Status: DC
  Filled 2013-06-28: qty 300

## 2013-06-28 MED ORDER — VECURONIUM BROMIDE 10 MG IV SOLR
INTRAVENOUS | Status: DC | PRN
  Administered 2013-06-28: 3 mg via INTRAVENOUS

## 2013-06-28 MED ORDER — MORPHINE SULFATE 2 MG/ML IJ SOLN
1.0000 mg | INTRAMUSCULAR | Status: DC | PRN
  Administered 2013-06-28 – 2013-06-29 (×2): 2 mg via INTRAVENOUS
  Filled 2013-06-28 (×2): qty 1

## 2013-06-28 MED ORDER — FLEET ENEMA 7-19 GM/118ML RE ENEM: 1.0000 | ENEMA | Freq: Once | RECTAL | Status: DC

## 2013-06-28 MED ORDER — ROCURONIUM BROMIDE 100 MG/10ML IV SOLN
INTRAVENOUS | Status: DC | PRN
  Administered 2013-06-28: 50 mg via INTRAVENOUS

## 2013-06-28 MED ORDER — LIDOCAINE HCL (PF) 1 % IJ SOLN
INTRAMUSCULAR | Status: AC
  Filled 2013-06-28: qty 30

## 2013-06-28 MED ORDER — LITHIUM CARBONATE ER 450 MG PO TBCR: 450.0000 mg | EXTENDED_RELEASE_TABLET | Freq: Every day | ORAL | Status: DC

## 2013-06-28 MED ORDER — FLUTICASONE PROPIONATE HFA 44 MCG/ACT IN AERO: 2.0000 | INHALATION_SPRAY | Freq: Two times a day (BID) | RESPIRATORY_TRACT | Status: DC

## 2013-06-28 MED ORDER — LIDOCAINE HCL 1 % IJ SOLN
INTRAMUSCULAR | Status: DC | PRN
  Administered 2013-06-28: 14:00:00

## 2013-06-28 MED ORDER — MIDAZOLAM HCL 2 MG/2ML IJ SOLN: 1.0000 mg | INTRAMUSCULAR | Status: DC | PRN

## 2013-06-28 MED ORDER — LITHIUM CARBONATE ER 450 MG PO TBCR
900.0000 mg | EXTENDED_RELEASE_TABLET | Freq: Every day | ORAL | Status: DC
  Administered 2013-06-28 – 2013-06-29 (×2): 900 mg via ORAL
  Filled 2013-06-28 (×3): qty 2

## 2013-06-28 MED ORDER — HYDROMORPHONE HCL PF 1 MG/ML IJ SOLN
INTRAMUSCULAR | Status: AC
  Filled 2013-06-28: qty 1

## 2013-06-28 MED ORDER — ACETAMINOPHEN 10 MG/ML IV SOLN
INTRAVENOUS | Status: AC
  Administered 2013-06-28: 1000 mg
  Filled 2013-06-28: qty 100

## 2013-06-28 MED ORDER — ONDANSETRON HCL 4 MG PO TABS: 4.0000 mg | ORAL_TABLET | Freq: Four times a day (QID) | ORAL | Status: DC | PRN

## 2013-06-28 MED ORDER — ONDANSETRON 4 MG PO TBDP: 4.0000 mg | ORAL_TABLET | Freq: Three times a day (TID) | ORAL | Status: DC | PRN

## 2013-06-28 MED ORDER — SODIUM CHLORIDE 0.9 % IR SOLN
Status: DC | PRN
  Administered 2013-06-28: 1000 mL

## 2013-06-28 MED ORDER — MIDAZOLAM HCL 5 MG/5ML IJ SOLN
INTRAMUSCULAR | Status: DC | PRN
  Administered 2013-06-28: 2 mg via INTRAVENOUS

## 2013-06-28 SURGICAL SUPPLY — 84 items
ADH SKN CLS APL DERMABOND .7 (GAUZE/BANDAGES/DRESSINGS) ×1
APPLICATOR COTTON TIP 6IN STRL (MISCELLANEOUS) ×2 IMPLANT
BAG SPEC RTRVL LRG 6X4 10 (ENDOMECHANICALS) ×2
BLADE SURG 10 STRL SS (BLADE) ×2 IMPLANT
BLADE SURG ROTATE 9660 (MISCELLANEOUS) IMPLANT
CANISTER SUCTION 2500CC (MISCELLANEOUS) ×4 IMPLANT
CATH FOLEY LATEX FREE 16FR (CATHETERS) ×2
CATH FOLEY LF 16FR (CATHETERS) ×1 IMPLANT
CHLORAPREP W/TINT 26ML (MISCELLANEOUS) ×4 IMPLANT
CONT SPEC 4OZ CLIKSEAL STRL BL (MISCELLANEOUS) ×2 IMPLANT
COVER SURGICAL LIGHT HANDLE (MISCELLANEOUS) ×2 IMPLANT
DERMABOND ADVANCED (GAUZE/BANDAGES/DRESSINGS) ×1
DERMABOND ADVANCED .7 DNX12 (GAUZE/BANDAGES/DRESSINGS) ×1 IMPLANT
DEVICE SUTURE ENDOST 10MM (ENDOMECHANICALS) ×2 IMPLANT
DRAIN CHANNEL 19F RND (DRAIN) IMPLANT
DRAIN PENROSE 1/2X36 STERILE (WOUND CARE) IMPLANT
DRAPE UTILITY 15X26 W/TAPE STR (DRAPE) ×4 IMPLANT
DRAPE WARM FLUID 44X44 (DRAPE) ×2 IMPLANT
DRSG COVADERM 4X10 (GAUZE/BANDAGES/DRESSINGS) IMPLANT
DRSG COVADERM 4X14 (GAUZE/BANDAGES/DRESSINGS) IMPLANT
ELECT BLADE 6.5 EXT (BLADE) IMPLANT
ELECT CAUTERY BLADE 6.4 (BLADE) ×2 IMPLANT
ELECT PAD DSPR THERM+ ADLT (MISCELLANEOUS) ×2 IMPLANT
ELECT REM PT RETURN 9FT ADLT (ELECTROSURGICAL) ×2
ELECTRODE REM PT RTRN 9FT ADLT (ELECTROSURGICAL) ×1 IMPLANT
EVACUATOR SILICONE 100CC (DRAIN) IMPLANT
GLOVE BIO SURGEON STRL SZ 6 (GLOVE) IMPLANT
GLOVE BIOGEL PI IND STRL 6.5 (GLOVE) ×1 IMPLANT
GLOVE BIOGEL PI INDICATOR 6.5 (GLOVE) ×1
GLOVE SURG SS PI 6.0 STRL IVOR (GLOVE) ×2 IMPLANT
GLOVE SURG SS PI 6.5 STRL IVOR (GLOVE) ×2 IMPLANT
GLOVE SURG SS PI 7.0 STRL IVOR (GLOVE) ×2 IMPLANT
GLOVE SURG SS PI 7.5 STRL IVOR (GLOVE) ×2 IMPLANT
GOWN PREVENTION PLUS XXLARGE (GOWN DISPOSABLE) ×4 IMPLANT
GOWN STRL NON-REIN LRG LVL3 (GOWN DISPOSABLE) ×4 IMPLANT
HEMOSTAT SNOW SURGICEL 2X4 (HEMOSTASIS) ×2 IMPLANT
HEMOSTAT SURGICEL 2X14 (HEMOSTASIS) IMPLANT
KIT BASIN OR (CUSTOM PROCEDURE TRAY) ×2 IMPLANT
KIT ROOM TURNOVER OR (KITS) ×2 IMPLANT
LOOP VESSEL MAXI BLUE (MISCELLANEOUS) IMPLANT
NS IRRIG 1000ML POUR BTL (IV SOLUTION) ×4 IMPLANT
PAD ARMBOARD 7.5X6 YLW CONV (MISCELLANEOUS) ×4 IMPLANT
PENCIL BUTTON HOLSTER BLD 10FT (ELECTRODE) ×2 IMPLANT
POUCH SPECIMEN RETRIEVAL 10MM (ENDOMECHANICALS) ×4 IMPLANT
RELOAD BLUE (STAPLE) IMPLANT
RELOAD ENDO STITCH (ENDOMECHANICALS) ×10 IMPLANT
RELOAD WHITE ECR60W (STAPLE) IMPLANT
SCALPEL HARMONIC ACE (MISCELLANEOUS) ×2 IMPLANT
SEALANT SURGICAL APPL DUAL CAN (MISCELLANEOUS) IMPLANT
SET IRRIG TUBING LAPAROSCOPIC (IRRIGATION / IRRIGATOR) ×2 IMPLANT
SLEEVE ENDOPATH XCEL 5M (ENDOMECHANICALS) ×6 IMPLANT
SPONGE GAUZE 4X4 12PLY (GAUZE/BANDAGES/DRESSINGS) IMPLANT
STAPLE ECHEON FLEX 60 POW ENDO (STAPLE) IMPLANT
STAPLER STANDARD HANDLE (STAPLE) IMPLANT
STAPLER VISISTAT 35W (STAPLE) IMPLANT
SUT ETHILON 2 0 FS 18 (SUTURE) IMPLANT
SUT MNCRL AB 4-0 PS2 18 (SUTURE) ×2 IMPLANT
SUT PDS AB 1 TP1 96 (SUTURE) IMPLANT
SUT PDS II 0 TP-1 LOOPED 60 (SUTURE) IMPLANT
SUT SURGIDAC NAB ES-9 0 48 120 (SUTURE) ×2 IMPLANT
SUT VIC AB 2-0 SH 18 (SUTURE) IMPLANT
SUT VIC AB 2-0 SH 27 (SUTURE) ×8
SUT VIC AB 2-0 SH 27X BRD (SUTURE) ×4 IMPLANT
SUT VIC AB 3-0 SH 18 (SUTURE) IMPLANT
SUT VICRYL 0 UR6 27IN ABS (SUTURE) ×2 IMPLANT
SUT VICRYL AB 2 0 TIES (SUTURE) IMPLANT
SUT VICRYL AB 3 0 TIES (SUTURE) IMPLANT
SYS LAPSCP GELPORT 120MM (MISCELLANEOUS)
SYSTEM LAPSCP GELPORT 120MM (MISCELLANEOUS) IMPLANT
TIP INNERVISION DETACH 40FR (MISCELLANEOUS) IMPLANT
TIP INNERVISION DETACH 50FR (MISCELLANEOUS) IMPLANT
TIP INNERVISION DETACH 56FR (MISCELLANEOUS) IMPLANT
TIPS INNERVISION DETACH 40FR (MISCELLANEOUS)
TOWEL OR 17X24 6PK STRL BLUE (TOWEL DISPOSABLE) ×2 IMPLANT
TOWEL OR 17X26 10 PK STRL BLUE (TOWEL DISPOSABLE) ×2 IMPLANT
TOWEL OR NON WOVEN STRL DISP B (DISPOSABLE) ×4 IMPLANT
TRAY FOLEY CATH 14FRSI W/METER (CATHETERS) IMPLANT
TRAY LAPAROSCOPIC (CUSTOM PROCEDURE TRAY) ×2 IMPLANT
TROCAR XCEL 12X100 BLDLESS (ENDOMECHANICALS) ×2 IMPLANT
TROCAR XCEL BLUNT TIP 100MML (ENDOMECHANICALS) ×2 IMPLANT
TROCAR XCEL NON-BLD 5MMX100MML (ENDOMECHANICALS) ×2 IMPLANT
TUBE CONNECTING 12X1/4 (SUCTIONS) ×2 IMPLANT
TUBING FILTER THERMOFLATOR (ELECTROSURGICAL) ×2 IMPLANT
YANKAUER SUCT BULB TIP NO VENT (SUCTIONS) ×2 IMPLANT

## 2013-06-28 NOTE — Anesthesia Procedure Notes (Signed)
Procedure Name: Intubation Date/Time: 06/28/2013 10:38 AM Performed by: Whitman Hero Pre-anesthesia Checklist: Patient identified, Emergency Drugs available, Suction available and Patient being monitored Patient Re-evaluated:Patient Re-evaluated prior to inductionOxygen Delivery Method: Circle system utilized Preoxygenation: Pre-oxygenation with 100% oxygen Intubation Type: IV induction Ventilation: Mask ventilation without difficulty Laryngoscope Size: Mac and 3 Grade View: Grade I Tube type: Oral Tube size: 7.5 mm Number of attempts: 1 Airway Equipment and Method: Stylet Placement Confirmation: ETT inserted through vocal cords under direct vision,  positive ETCO2 and breath sounds checked- equal and bilateral Secured at: 22 cm Tube secured with: Tape Dental Injury: Teeth and Oropharynx as per pre-operative assessment

## 2013-06-28 NOTE — Interval H&P Note (Signed)
History and Physical Interval Note:  06/28/2013 9:48 AM  [name]  has presented today for surgery, with the diagnosis of liver mass  The various methods of treatment have been discussed with the patient and family. After consideration of risks, benefits and other options for treatment, the patient has consented to  Procedure(s): LAPAROSCOPIC VS OPEN RESECTION OF LIVER MASS (N/A) as a surgical intervention .  The patient's history has been reviewed, patient examined, no change in status, stable for surgery.  I have reviewed the patient's chart and labs.  Questions were answered to the patient's satisfaction.     Monterrius Cardosa

## 2013-06-28 NOTE — Op Note (Signed)
PRE-OPERATIVE DIAGNOSIS: liver mass, dome of liver   POST-OPERATIVE DIAGNOSIS:  Same  PROCEDURE:  Procedure(s): Laparoscopic resection of hepatic mass  SURGEON:  Surgeon(s): Almond Lint, MD  ASSISTANT:   Romie Levee, MD  ANESTHESIA:   local and general  DRAINS: none   LOCAL MEDICATIONS USED:  MARCAINE    and LIDOCAINE   SPECIMEN:  Source of Specimen:  falciform mass, dome of liver mass  DISPOSITION OF SPECIMEN:  PATHOLOGY  COUNTS:  YES  DICTATION: .Dragon Dictation  PLAN OF CARE: Admit to inpatient   PATIENT DISPOSITION:  PACU - hemodynamically stable.  EBL:  Minimal  Findings:  Firm mass at dome of liver.  Liver mass adherent to diaphragm.    PROCEDURE:    Pt was identified in the holding area and taken to operating room where she was placed operating room table. General anesthesia was induced and then she placed into the partial right lateral position on a beanbag. Her abdomen was then prepped and draped in sterile fashion. Timeout was performed according to the chart thank you check list. Knowledge tract commonly continued.  A Hasson was placed at the supraumbilical scar that she had from previous surgery.  The area is infiltrated with local and then a transverse incision was made with #11 blade. A Kelly clamp was used to dissect the subcutaneous tissues bluntly.  The. A scratch elevated. A #11 blade was used to enter the umbilical fascia. A 0 Vicryl pursestring suture was placed around the fascial incision.  Was introduced into the abdomen and pneumoperitoneum was achieved to a pressure of 15 mm mercury.  The abdomen and centered at the camera and a mean he is in the right upper quadrant. A trocar was placed in the right lateral abdomen and p.m. it was used to take down to the adhesions. The falciform was taken down and there was a small mass seen at the base of falciform.  This appeared to correlate with the left sided mass seen on the MRI.  This was placed into an  Endo Catch bag and removed through the umbilical incision. Two additional 5 mm trocars were placed along a line that would be a subcostal incision if we had to open.  A third trocar was placed medially along that line.    The adhesions were taken down with the harmonic.  The mass was identified, and it was taken off the diaphragm.  It was intimately associated with the diaphragm, and a small hole was made in the diaphragm with the harmonic.  This was repaired with 2-0 vicryl and 2-0 ethibond with pledgets.    The mass was eventually taken off the diaphragm completely.  The mass was then taken out of the liver with the harmonic.  Care was taken not to press on the mass or grab it with the graspers.    Once it was completely free, it was placed into an endocatch bag and retrieved from the abdomen. The skin and the fascia had to be opened a bit in order to get the mass out.  The area is not irrigated as it was felt that this can. She spread of the mass earlier. Surgicel SNOW was placed into the cavity in the liver.  The integrity of the diaphragmatic repair was examined.  The trocars were then removed under direct visualization with no evidence of bleeding from the abdominal wall.  Pneumoperitoneum was released. The fascia was closed with an additional 0 Vicryl suture and then the original suture  was tied down. There is no residual palpable fascial defect. Skin on the incisions closed with 4-0 Monocryl.  The patient is awaiting transfusion back in stable condition. Needle, sponge, and instrument counts were correct x 2.

## 2013-06-28 NOTE — Anesthesia Preprocedure Evaluation (Signed)
Anesthesia Evaluation  Patient identified by MRN, date of birth, ID band Patient awake    Reviewed: Allergy & Precautions, H&P , NPO status , Patient's Chart, lab work & pertinent test results  Airway Mallampati: II TM Distance: >3 FB Neck ROM: Full    Dental   Pulmonary asthma ,    + decreased breath sounds      Cardiovascular Rhythm:Regular Rate:Normal     Neuro/Psych  Headaches, Anxiety Depression    GI/Hepatic GERD-  ,  Endo/Other  diabetes  Renal/GU      Musculoskeletal   Abdominal (+) + obese,   Peds  Hematology  (+) HIV,   Anesthesia Other Findings   Reproductive/Obstetrics                           Anesthesia Physical Anesthesia Plan  ASA: III  Anesthesia Plan: General   Post-op Pain Management:    Induction: Intravenous  Airway Management Planned: Oral ETT  Additional Equipment: Arterial line  Intra-op Plan:   Post-operative Plan: Extubation in OR  Informed Consent: I have reviewed the patients History and Physical, chart, labs and discussed the procedure including the risks, benefits and alternatives for the proposed anesthesia with the patient or authorized representative who has indicated his/her understanding and acceptance.     Plan Discussed with: CRNA and Surgeon  Anesthesia Plan Comments:         Anesthesia Quick Evaluation

## 2013-06-28 NOTE — Transfer of Care (Signed)
Immediate Anesthesia Transfer of Care Note  Patient: [name]  Procedure(s) Performed: Procedure(s): LAPAROSCOPIC EXCISION HEPATIC MASS (N/A)  Patient Location: PACU  Anesthesia Type:General  Level of Consciousness: sedated and patient cooperative  Airway & Oxygen Therapy: Patient Spontanous Breathing and Patient connected to face mask oxygen  Post-op Assessment: Report given to PACU RN and Post -op Vital signs reviewed and stable  Post vital signs: Reviewed and stable  Complications: No apparent anesthesia complications

## 2013-06-28 NOTE — Progress Notes (Signed)
Pt stated she did not use a fleets enema last night.  Dr Donell Beers stated not to give this a.m.

## 2013-06-28 NOTE — H&P (View-Only) (Signed)
HISTORY: Pt is a 31 yo F with a history of HIV and ruptured dermoid cyst.  She more recently had an unruptured ovarian dermoid, but her first one was ruptured.  There was no upper abdominal imaging in between the two to document the appearance of the liver.  She presented earlier with liver masses and a cavitary lung lesion.  In discussing her case with radiology, this appears classic for recurrent dermoid.  The liver masses appear to be on the surface.  MRI agrees.     PERTINENT REVIEW OF SYSTEMS: Otherwise negative x 11.  Filed Vitals:   06/05/13 1445  BP: 134/82  Pulse: 71  Temp: 97.8 F (36.6 C)  Resp: 16   Filed Weights   06/05/13 1445  Weight: 185 lb 12.8 oz (84.278 kg)     EXAM: Head: Normocephalic and atraumatic.  Eyes:  Conjunctivae are normal. Pupils are equal, round, and reactive to light. No scleral icterus.  Resp: No respiratory distress, normal effort. Abd:  Abdomen is soft, non distended and non tender. No masses are palpable.  There is no rebound and no guarding.  Lower midline incision is without hernia.    Neurological: Alert and oriented to person, place, and time. Coordination normal.  Skin: Skin is warm and dry. No rash noted. No diaphoretic. No erythema. No pallor.  Psychiatric: Normal mood and affect. Normal behavior. Judgment and thought content normal.      ASSESSMENT AND PLAN:   Superficial Liver masses, right lobe; suspected dermoid recurrence.   Discussed surgery.   Will plan attempted laparoscopic removal. May have to open since masses are very posterior.    Discussed risks including dermoid recurrence, bleeding, infection, bile leak, conversion to open operation, blood transfusion, blood clot, and more.    She wishes to proceed.        Maudry Diego, MD Surgical Oncology, General & Endocrine Surgery [facility]  Geraldo Pitter, MD [place], [place]., MD

## 2013-06-28 NOTE — Anesthesia Postprocedure Evaluation (Signed)
  Anesthesia Post-op Note  Patient: [name]  Procedure(s) Performed: Procedure(s): LAPAROSCOPIC EXCISION HEPATIC MASS (N/A)  Patient Location: PACU  Anesthesia Type:General  Level of Consciousness: awake and alert   Airway and Oxygen Therapy: Patient Spontanous Breathing  Post-op Pain: mild  Post-op Assessment: Post-op Vital signs reviewed, Patient's Cardiovascular Status Stable, Respiratory Function Stable, Patent Airway, No signs of Nausea or vomiting and Pain level controlled  Post-op Vital Signs: Reviewed and stable  Complications: No apparent anesthesia complications

## 2013-06-29 LAB — CBC
HCT: 37.5 % (ref 36.0–46.0)
Hemoglobin: 12.9 g/dL (ref 12.0–15.0)
MCHC: 34.4 g/dL (ref 30.0–36.0)
MCV: 94.7 fL (ref 78.0–100.0)
Platelets: 252 10*3/uL (ref 150–400)
RDW: 13.3 % (ref 11.5–15.5)
WBC: 13.5 10*3/uL — ABNORMAL HIGH (ref 4.0–10.5)

## 2013-06-29 LAB — COMPREHENSIVE METABOLIC PANEL
ALT: 76 U/L — ABNORMAL HIGH (ref 0–35)
AST: 81 U/L — ABNORMAL HIGH (ref 0–37)
Albumin: 3.1 g/dL — ABNORMAL LOW (ref 3.5–5.2)
CO2: 25 mEq/L (ref 19–32)
Calcium: 8.7 mg/dL (ref 8.4–10.5)
Chloride: 102 mEq/L (ref 96–112)
Creatinine, Ser: 0.97 mg/dL (ref 0.50–1.10)
GFR calc Af Amer: 88 mL/min — ABNORMAL LOW (ref >90)
GFR calc non Af Amer: 76 mL/min — ABNORMAL LOW (ref >90)
Glucose, Bld: 95 mg/dL (ref 70–99)
Potassium: 4.2 mEq/L (ref 3.5–5.1)
Total Bilirubin: 0.8 mg/dL (ref 0.3–1.2)
Total Protein: 6.7 g/dL (ref 6.0–8.3)

## 2013-06-29 MED ORDER — ONDANSETRON 8 MG/NS 50 ML IVPB: 8.0000 mg | Freq: Four times a day (QID) | INTRAVENOUS | Status: DC | PRN

## 2013-06-29 MED ORDER — FLUNISOLIDE 29 MCG/ACT NA SOLN: 2.0000 | Freq: Every day | NASAL | Status: DC

## 2013-06-29 MED ORDER — POLYETHYLENE GLYCOL 3350 17 G PO PACK
17.0000 g | PACK | Freq: Every day | ORAL | Status: DC
  Administered 2013-06-29 – 2013-06-30 (×2): 17 g via ORAL
  Filled 2013-06-29 (×2): qty 1

## 2013-06-29 MED ORDER — OXYCODONE HCL 5 MG PO TABS
5.0000 mg | ORAL_TABLET | ORAL | Status: AC | PRN
  Administered 2013-06-29 (×2): 10 mg via ORAL
  Filled 2013-06-29 (×2): qty 2

## 2013-06-29 MED ORDER — ONDANSETRON HCL 4 MG/2ML IJ SOLN: 4.0000 mg | Freq: Four times a day (QID) | INTRAMUSCULAR | Status: DC | PRN

## 2013-06-29 NOTE — Progress Notes (Signed)
1 Day Post-Op  Subjective: Pt sore, but no nausea or vomiting.    Objective: Vital signs in last 24 hours: Temp:  [97.2 F (36.2 C)-98.7 F (37.1 C)] 98.7 F (37.1 C) (11/14 0542) Pulse Rate:  [78-116] 85 (11/14 0542) Resp:  [13-20] 20 (11/14 0542) BP: (108-156)/(68-89) 115/71 mmHg (11/14 0542) SpO2:  [92 %-100 %] 100 % (11/14 0542) Weight:  [185 lb (83.915 kg)] 185 lb (83.915 kg) (11/13 1730) Last BM Date: 06/28/13  Intake/Output from previous day: 11/13 0701 - 11/14 0700 In: 4200 [I.V.:3400; IV Piggyback:800] Out: 1820 [Urine:1820] Intake/Output this shift:    General appearance: alert, cooperative and no distress Eyes: conjunctivae/corneas clear. PERRL, EOM's intact. Fundi benign. GI: soft, approp tender, sl distended.    Lab Results:   Recent Labs  06/26/13 1539 06/29/13 0640  WBC 11.9* 13.5*  HGB 13.9 12.9  HCT 39.8 37.5  PLT 313 252   BMET  Recent Labs  06/26/13 1539 06/29/13 0640  NA 138 134*  K 4.2 4.2  CL 104 102  CO2 25 25  GLUCOSE 95 95  BUN 14 7  CREATININE 1.03 0.97  CALCIUM 9.3 8.7   PT/INR  Recent Labs  06/26/13 1539  LABPROT 12.2  INR 0.92   ABG No results found for this basename: PHART, PCO2, PO2, HCO3,  in the last 72 hours  Studies/Results: No results found.  Anti-infectives: Anti-infectives   Start     Dose/Rate Route Frequency Ordered Stop   06/29/13 1000  amoxicillin-clavulanate (AUGMENTIN) 875-125 MG per tablet 1 tablet     1 tablet Oral 2 times daily 06/28/13 1726     06/29/13 1000  dolutegravir (TIVICAY) tablet 50 mg     50 mg Oral Daily 06/28/13 1726     06/29/13 1000  emtricitabine-tenofovir (TRUVADA) 200-300 MG per tablet 1 tablet     1 tablet Oral Daily 06/28/13 1726     06/29/13 0800  Darunavir Ethanolate (PREZISTA) tablet 800 mg     800 mg Oral Daily with breakfast 06/28/13 1726     06/29/13 0800  ritonavir (NORVIR) tablet 100 mg     100 mg Oral Daily with breakfast 06/28/13 1726     06/28/13 2200   valACYclovir (VALTREX) tablet 500 mg     500 mg Oral 2 times daily 06/28/13 1726     06/28/13 1900  cefOXitin (MEFOXIN) 1 g in dextrose 5 % 50 mL IVPB     1 g 100 mL/hr over 30 Minutes Intravenous Every 6 hours 06/28/13 1726 06/29/13 0639   06/28/13 1030  ceFAZolin (ANCEF) IVPB 2 g/50 mL premix  Status:  Discontinued     2 g 100 mL/hr over 30 Minutes Intravenous On call to O.R. 06/28/13 1030 06/28/13 1031   06/28/13 0600  cefOXitin (MEFOXIN) 2 g in dextrose 5 % 50 mL IVPB     2 g 100 mL/hr over 30 Minutes Intravenous On call to O.R. 06/27/13 1411 06/28/13 1045      Assessment/Plan: s/p Procedure(s): LAPAROSCOPIC EXCISION HEPATIC MASS (N/A) d/c foley Advance diet decrease IVF Add miralax Continue antiretrovirals for HIV.      LOS: 1 day    [facility] 06/29/2013

## 2013-06-30 LAB — TYPE AND SCREEN
ABO/RH(D): O POS
Antibody Screen: NEGATIVE
Unit division: 0
Unit division: 0
Unit division: 0
Unit division: 0

## 2013-06-30 MED ORDER — OXYCODONE-ACETAMINOPHEN 5-325 MG PO TABS: 1.0000 | ORAL_TABLET | ORAL | Status: DC | PRN

## 2013-06-30 NOTE — Progress Notes (Signed)
2 Days Post-Op   Assessment: s/p Procedure(s): LAPAROSCOPIC EXCISION HEPATIC MASS Patient Active Problem List   Diagnosis Date Noted  . Lung abscess 06/26/2013  . Cavitary lesion of lung 06/19/2013  . 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   05/07/2013  . Ovarian cystic mass 05/07/2013  . Post-operative state 12/25/2012  . Follow-up examination, following unspecified surgery 12/25/2012  . s/p Serosal repair of colon 12/04/2012 12/15/2012  . Herpes genitalis in women 12/07/2012  . Bipolar disorder, unspecified 12/07/2012  . Anxiety state, unspecified 12/07/2012  . Type II or unspecified type diabetes mellitus without mention of complication, not stated as uncontrolled 12/07/2012  . Unspecified asthma(493.90) 12/07/2012  . GERD (gastroesophageal reflux disease) 12/07/2012  . Pelvic mass in female s/p Robotic-assisted right salpingo-oophorectomy 12/04/2012  . Herpes genitalis 08/03/2012  . SHINGLES 01/31/2009  . HEMORRHOIDS NOS, W/O COMPLICATIONS 12/07/2006  . DISORDER, DEPRESSIVE NEC 11/25/2006  . ALLERGIC RHINITIS 11/25/2006  . 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 Human immunodeficiency virus (HIV) disease 11/10/2006    Doing well post op and ready to go home  Plan: Discharge  Subjective: Feels pretty good and wants to go home. Tolerating regular diet, minimal pain  Objective: Vital signs in last 24 hours: Temp:  [98.4 F (36.9 C)-99.5 F (37.5 C)] 98.9 F (37.2 C) (11/15 0434) Pulse Rate:  [87-95] 95 (11/15 0434) Resp:  [18] 18 (11/15 0434) BP: (110-130)/(67-90) 125/70 mmHg (11/15 0434) SpO2:  [99 %-100 %] 100 % (11/15 0434)   Intake/Output from previous day: 11/14 0701 - 11/15 0700 In: 2820.8 [P.O.:1080; I.V.:1740.8] Out: -   General appearance: alert, cooperative and no distress Resp: clear to auscultation bilaterally Cardio: regular rate and rhythm, S1, S2 normal, no murmur, click, rub or gallop GI: Abd soft, not tender  BS+  Incision: healing well  Lab Results:   Recent Labs  06/29/13 0640  WBC 13.5*  HGB 12.9  HCT 37.5  PLT 252   BMET  Recent Labs  06/29/13 0640  NA 134*  K 4.2  CL 102  CO2 25  GLUCOSE 95  BUN 7  CREATININE 0.97  CALCIUM 8.7    MEDS, Scheduled . adapalene  1 application Topical QHS  . amitriptyline  25 mg Oral QHS  . amoxicillin-clavulanate  1 tablet Oral BID  . ARIPiprazole  5 mg Oral Daily  . beclomethasone  2 puff Inhalation BID  . Darunavir Ethanolate  800 mg Oral Q breakfast  . dolutegravir  50 mg Oral Daily  . DULoxetine  60 mg Oral Daily  . emtricitabine-tenofovir  1 tablet Oral Daily  . flunisolide  2 spray Each Nare QHS  . lithium carbonate  450 mg Oral Daily  . lithium carbonate  900 mg Oral QHS  . loratadine  10 mg Oral Daily  . montelukast  10 mg Oral QHS  . pantoprazole  40 mg Oral Daily  . polyethylene glycol  17 g Oral Daily  . ritonavir  100 mg Oral Q breakfast  . valACYclovir  500 mg Oral BID    Studies/Results: No results found.    LOS: 2 days     Currie Paris, MD, [facility], [place] [phone]   06/30/2013 8:23 AM

## 2013-07-02 ENCOUNTER — Encounter (HOSPITAL_COMMUNITY): Payer: Self-pay | Admitting: General Surgery

## 2013-07-11 ENCOUNTER — Telehealth (INDEPENDENT_AMBULATORY_CARE_PROVIDER_SITE_OTHER): Payer: Self-pay

## 2013-07-11 NOTE — Telephone Encounter (Addendum)
error 

## 2013-07-11 NOTE — Telephone Encounter (Addendum)
Patient called to state glue is coming off incision site and area is somewhat irritate. Denies drainage,temp,redness. Advised her the glue will come off itself do not remove. She has P/O appt on Monday . To call if any changes, There is a Md available 24 hrs. Patient verbalized understanding

## 2013-07-16 ENCOUNTER — Encounter (INDEPENDENT_AMBULATORY_CARE_PROVIDER_SITE_OTHER): Payer: Medicaid Other | Admitting: General Surgery

## 2013-07-17 ENCOUNTER — Other Ambulatory Visit: Payer: Self-pay | Admitting: Internal Medicine

## 2013-07-31 ENCOUNTER — Other Ambulatory Visit: Payer: Medicaid Other

## 2013-07-31 DIAGNOSIS — B20 Human immunodeficiency virus [HIV] disease: Secondary | ICD-10-CM

## 2013-07-31 DIAGNOSIS — Z113 Encounter for screening for infections with a predominantly sexual mode of transmission: Secondary | ICD-10-CM

## 2013-07-31 DIAGNOSIS — Z21 Asymptomatic human immunodeficiency virus [HIV] infection status: Secondary | ICD-10-CM

## 2013-07-31 DIAGNOSIS — Z79899 Other long term (current) drug therapy: Secondary | ICD-10-CM

## 2013-07-31 LAB — CBC WITH DIFFERENTIAL/PLATELET
Basophils Absolute: 0.1 10*3/uL (ref 0.0–0.1)
Basophils Relative: 1 % (ref 0–1)
Eosinophils Absolute: 0.2 10*3/uL (ref 0.0–0.7)
Eosinophils Relative: 2 % (ref 0–5)
HCT: 40.4 % (ref 36.0–46.0)
Lymphocytes Relative: 33 % (ref 12–46)
MCH: 31.3 pg (ref 26.0–34.0)
MCHC: 34.7 g/dL (ref 30.0–36.0)
MCV: 90.4 fL (ref 78.0–100.0)
Monocytes Absolute: 0.4 10*3/uL (ref 0.1–1.0)
Neutro Abs: 5.6 10*3/uL (ref 1.7–7.7)
Neutrophils Relative %: 60 % (ref 43–77)
Platelets: 280 10*3/uL (ref 150–400)
RDW: 13.7 % (ref 11.5–15.5)
WBC: 9.2 10*3/uL (ref 4.0–10.5)

## 2013-07-31 LAB — LIPID PANEL
Cholesterol: 198 mg/dL (ref 0–200)
HDL: 30 mg/dL — ABNORMAL LOW (ref >39)
LDL Cholesterol: 150 mg/dL — ABNORMAL HIGH (ref 0–99)
Triglycerides: 88 mg/dL (ref <150)
VLDL: 18 mg/dL (ref 0–40)

## 2013-07-31 LAB — COMPLETE METABOLIC PANEL WITH GFR
ALT: 12 U/L (ref 0–35)
AST: 14 U/L (ref 0–37)
Alkaline Phosphatase: 98 U/L (ref 39–117)
BUN: 11 mg/dL (ref 6–23)
CO2: 26 mEq/L (ref 19–32)
Calcium: 9.5 mg/dL (ref 8.4–10.5)
Chloride: 105 mEq/L (ref 96–112)
Creat: 1.23 mg/dL — ABNORMAL HIGH (ref 0.50–1.10)
Total Bilirubin: 0.4 mg/dL (ref 0.3–1.2)

## 2013-08-01 LAB — T-HELPER CELL (CD4) - (RCID CLINIC ONLY): CD4 % Helper T Cell: 29 % — ABNORMAL LOW (ref 33–55)

## 2013-08-06 ENCOUNTER — Ambulatory Visit (INDEPENDENT_AMBULATORY_CARE_PROVIDER_SITE_OTHER): Payer: Medicaid Other | Admitting: *Deleted

## 2013-08-06 DIAGNOSIS — Z124 Encounter for screening for malignant neoplasm of cervix: Secondary | ICD-10-CM

## 2013-08-06 DIAGNOSIS — R87612 Low grade squamous intraepithelial lesion on cytologic smear of cervix (LGSIL): Secondary | ICD-10-CM

## 2013-08-06 NOTE — Progress Notes (Signed)
  Subjective:     [name] is a 32 y.o. woman who comes in today for a  pap smear only. . Previous abnormal Pap smears: yes. Contraception:  Depo shots x 3 months.  C/O vaginal discharge x 1 week.  Thinks it might be "BV."  Objective:    There were no vitals taken for this visit. Pelvic Exam:  Pap smear obtained.   Assessment:    Screening pap smear.   Plan:    Follow up in one year, or as indicated by Pap results.  Pt given educational materials re: HIV and women, self-esteem, BSE, nutrition and diet management, PAP smears and partner safety. Pt given condoms.

## 2013-08-06 NOTE — Patient Instructions (Addendum)
Your PAP smear results will be back in about a week.  Your results for any infection will be back Tuesday or Wednesday.  Thank you for coming to the Center for your care.  Angelique Blonder, RN

## 2013-08-13 ENCOUNTER — Other Ambulatory Visit: Payer: Self-pay | Admitting: *Deleted

## 2013-08-13 ENCOUNTER — Other Ambulatory Visit: Payer: Self-pay | Admitting: Internal Medicine

## 2013-08-13 DIAGNOSIS — B029 Zoster without complications: Secondary | ICD-10-CM

## 2013-08-13 MED ORDER — VALACYCLOVIR HCL 500 MG PO TABS: 500.0000 mg | ORAL_TABLET | Freq: Every day | ORAL | Status: DC

## 2013-08-13 MED ORDER — METRONIDAZOLE 500 MG PO TABS: 500.0000 mg | ORAL_TABLET | Freq: Two times a day (BID) | ORAL | Status: DC

## 2013-08-14 ENCOUNTER — Ambulatory Visit: Payer: Medicaid Other | Admitting: Internal Medicine

## 2013-08-14 ENCOUNTER — Telehealth: Payer: Self-pay | Admitting: *Deleted

## 2013-08-14 ENCOUNTER — Encounter: Payer: Self-pay | Admitting: *Deleted

## 2013-08-14 NOTE — Telephone Encounter (Signed)
Treatment for vaginal infection.   RX sent to [facility], will be delivered to the pt, take RX as directed on the bottle

## 2013-08-14 NOTE — Addendum Note (Signed)
Addended by: Jennet Maduro D on: 08/14/2013 09:37 AM   Modules accepted: Orders

## 2013-08-21 ENCOUNTER — Ambulatory Visit: Payer: Medicaid Other | Admitting: Internal Medicine

## 2013-08-23 ENCOUNTER — Ambulatory Visit (INDEPENDENT_AMBULATORY_CARE_PROVIDER_SITE_OTHER): Payer: Medicaid Other | Admitting: General Surgery

## 2013-08-23 ENCOUNTER — Encounter (INDEPENDENT_AMBULATORY_CARE_PROVIDER_SITE_OTHER): Payer: Self-pay | Admitting: General Surgery

## 2013-08-23 VITALS — BP 110/76 | HR 84 | Resp 14 | Ht 64.0 in | Wt 183.6 lb

## 2013-08-23 DIAGNOSIS — K769 Liver disease, unspecified: Secondary | ICD-10-CM

## 2013-08-23 DIAGNOSIS — R160 Hepatomegaly, not elsewhere classified: Secondary | ICD-10-CM

## 2013-08-23 NOTE — Assessment & Plan Note (Signed)
Pt doing well post op.  Follow up MRI in November of 2015.    Will see her back in 1 year.

## 2013-08-23 NOTE — Patient Instructions (Signed)
Follow up in 1 year.    MRI in November.

## 2013-08-23 NOTE — Progress Notes (Signed)
HISTORY: Pt is doing well overall since her lap removal of liver masses.  They were concerning for recurrent dermoid, but final pathology was negative.  She is here to determine options.      EXAM: General:  Alert and oriented.   Incision:  Well healed.  No evidence of hernia.  ? Hernia not at recnet laparoscopic sides.     PATHOLOGY: Diagnosis 1. Soft tissue mass, simple excision, Falciform - BENIGN FIBROVASCULAR AND ADIPOSE SOFT TISSUE CONTAINING CHRONIC LYMPHOHISTIOCYTIC INFLAMMATION WITH CYSTIC CHANGE. SEE COMMENT. - NEGATIVE FOR MALIGNANCY. 2. Soft tissue mass, simple excision, Diaphragmatic nodule - BENIGN FIBROVASCULAR AND ADIPOSE TISSUE WITH HYALINIZED CYST, SEE COMMENT - NEGATIVE FOR MALIGNANCY. 3. Liver, wedge biopsy, Hepatic mass, dome - BENIGN MULTILOCULAR CYST WITH ACUTE AND CHRONIC INFLAMMATION AND FOREIGN BODY GIANT CELL REACTION, SEE COMMENT. - INCIDENTAL BENIGN MUSCLE. - INCIDENTAL BENIGN LIVER. - NEGATIVE FOR MALIGNANCY   ASSESSMENT AND PLAN:   Superficial Liver masses, right lobe; suspected dermoid recurrence.   Pt doing well post op.  Follow up MRI in November of 2015.    Will see her back in 1 year.        Milus Height, MD Surgical Oncology, [facility]  Elyn Peers, MD Lucianne Lei, MD

## 2013-08-28 ENCOUNTER — Encounter: Payer: Self-pay | Admitting: Obstetrics and Gynecology

## 2013-09-12 ENCOUNTER — Ambulatory Visit: Payer: Medicaid Other | Admitting: Internal Medicine

## 2013-09-12 ENCOUNTER — Other Ambulatory Visit: Payer: Self-pay | Admitting: Internal Medicine

## 2013-09-18 ENCOUNTER — Telehealth: Payer: Self-pay | Admitting: *Deleted

## 2013-09-18 ENCOUNTER — Ambulatory Visit: Payer: Medicaid Other | Admitting: Internal Medicine

## 2013-09-18 NOTE — Telephone Encounter (Signed)
Called patient and rescheduled her appt for 09/27/13. She no showed today. [name]

## 2013-09-27 ENCOUNTER — Encounter: Payer: Medicaid Other | Admitting: Obstetrics and Gynecology

## 2013-09-27 ENCOUNTER — Ambulatory Visit: Payer: Medicaid Other | Admitting: Internal Medicine

## 2013-10-01 ENCOUNTER — Ambulatory Visit: Payer: Medicaid Other | Admitting: Infectious Diseases

## 2013-10-01 ENCOUNTER — Other Ambulatory Visit: Payer: Self-pay | Admitting: Internal Medicine

## 2013-10-12 ENCOUNTER — Other Ambulatory Visit: Payer: Self-pay | Admitting: Internal Medicine

## 2013-10-17 ENCOUNTER — Ambulatory Visit (INDEPENDENT_AMBULATORY_CARE_PROVIDER_SITE_OTHER): Payer: Medicaid Other | Admitting: Infectious Diseases

## 2013-10-17 ENCOUNTER — Encounter: Payer: Self-pay | Admitting: Infectious Diseases

## 2013-10-17 VITALS — BP 117/78 | HR 82 | Temp 98.2°F | Ht 64.0 in | Wt 189.0 lb

## 2013-10-17 DIAGNOSIS — J852 Abscess of lung without pneumonia: Secondary | ICD-10-CM

## 2013-10-17 DIAGNOSIS — R87612 Low grade squamous intraepithelial lesion on cytologic smear of cervix (LGSIL): Secondary | ICD-10-CM

## 2013-10-17 DIAGNOSIS — B20 Human immunodeficiency virus [HIV] disease: Secondary | ICD-10-CM

## 2013-10-17 DIAGNOSIS — J309 Allergic rhinitis, unspecified: Secondary | ICD-10-CM

## 2013-10-17 DIAGNOSIS — Z113 Encounter for screening for infections with a predominantly sexual mode of transmission: Secondary | ICD-10-CM

## 2013-10-17 DIAGNOSIS — Z79899 Other long term (current) drug therapy: Secondary | ICD-10-CM

## 2013-10-17 LAB — COMPREHENSIVE METABOLIC PANEL
ALK PHOS: 96 U/L (ref 39–117)
ALT: 17 U/L (ref 0–35)
AST: 17 U/L (ref 0–37)
Albumin: 4.2 g/dL (ref 3.5–5.2)
BUN: 12 mg/dL (ref 6–23)
CHLORIDE: 104 meq/L (ref 96–112)
CO2: 27 mEq/L (ref 19–32)
CREATININE: 1.15 mg/dL — AB (ref 0.50–1.10)
Calcium: 9.4 mg/dL (ref 8.4–10.5)
Glucose, Bld: 77 mg/dL (ref 70–99)
Potassium: 4 mEq/L (ref 3.5–5.3)
Sodium: 137 mEq/L (ref 135–145)
Total Bilirubin: 0.4 mg/dL (ref 0.2–1.2)
Total Protein: 6.9 g/dL (ref 6.0–8.3)

## 2013-10-17 LAB — CBC
HCT: 40.8 % (ref 36.0–46.0)
Hemoglobin: 13.9 g/dL (ref 12.0–15.0)
MCH: 32.1 pg (ref 26.0–34.0)
MCHC: 34.1 g/dL (ref 30.0–36.0)
MCV: 94.2 fL (ref 78.0–100.0)
PLATELETS: 286 10*3/uL (ref 150–400)
RBC: 4.33 MIL/uL (ref 3.87–5.11)
RDW: 14.7 % (ref 11.5–15.5)
WBC: 9.4 10*3/uL (ref 4.0–10.5)

## 2013-10-17 LAB — LIPID PANEL
CHOL/HDL RATIO: 6 ratio
Cholesterol: 185 mg/dL (ref 0–200)
HDL: 31 mg/dL — ABNORMAL LOW (ref >39)
LDL CALC: 128 mg/dL — AB (ref 0–99)
Triglycerides: 130 mg/dL (ref <150)
VLDL: 26 mg/dL (ref 0–40)

## 2013-10-17 LAB — HEPATITIS B SURFACE ANTIBODY,QUALITATIVE: HEP B S AB: NEGATIVE

## 2013-10-17 LAB — RPR

## 2013-10-17 MED ORDER — AZITHROMYCIN 250 MG PO TABS: ORAL_TABLET | ORAL | Status: DC

## 2013-10-17 NOTE — Assessment & Plan Note (Signed)
Will schedule repeat CXR.

## 2013-10-17 NOTE — Progress Notes (Signed)
   Subjective:    Patient ID: [name], female    DOB: 05/30/1981, 33 y.o.   MRN: [id number]  HPI 33 yo F with hx of HIV+ on DRVr/TRV/DTGV. 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 Plan for repeat MRI 06-2014.  She was also noted to have a LLL lesion that was felt to be a lung abscess by pulmonary. She was given 10 days of augmentin for this.  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  Feels like she is getting sinus infection- some improvement with sudafed, alka seltzer+ colds/sinus.  Drainage and pressure in face and behind her eyes. Occas chills.  Denies missed ART.   HIV 1 RNA Quant (copies/mL)  Date Value  07/31/2013 <20   05/03/2013 57*  03/01/2013 32*     CD4 T Cell Abs (/uL)  Date Value  07/31/2013 940   05/03/2013 1000   03/01/2013 720     Review of Systems  Constitutional: Positive for chills. Negative for fever, appetite change and unexpected weight change.  HENT: Positive for rhinorrhea and sinus pressure. Negative for sore throat.   Gastrointestinal: Negative for diarrhea and constipation.  Genitourinary: Negative for difficulty urinating and menstrual problem.  Neurological: Positive for headaches.  on depo- no periods.      Objective:   Physical Exam  Constitutional: She appears well-developed and well-nourished.  HENT:  Nose: Right sinus exhibits maxillary sinus tenderness. Right sinus exhibits no frontal sinus tenderness. Left sinus exhibits maxillary sinus tenderness. Left sinus exhibits no frontal sinus tenderness.  Mouth/Throat: No oropharyngeal exudate.  Eyes: EOM are normal. Pupils are equal, round, and reactive to light.  Neck: Neck supple.  Cardiovascular: Normal rate, regular rhythm and normal heart sounds.   Pulmonary/Chest: Effort normal and breath sounds normal.  Abdominal: Soft. Bowel sounds are normal. There is no tenderness. There is no rebound.  Lymphadenopathy:    She has no cervical  adenopathy.          Assessment & Plan:

## 2013-10-17 NOTE — Addendum Note (Signed)
Addended by: Dolan Amen D on: 10/17/2013 11:25 AM   Modules accepted: Orders

## 2013-10-17 NOTE — Assessment & Plan Note (Signed)
She has colpo scheduled 10-24-13.

## 2013-10-17 NOTE — Assessment & Plan Note (Signed)
Will give her tiral of z-pack. Enforced need for her to take zyrtec daily.

## 2013-10-17 NOTE — Assessment & Plan Note (Signed)
Will send her for labs. Offered/refused condoms (states she is allergic to latex). vax are up to date. Will check her Hep B S Ab. rtc 6 months.

## 2013-10-18 LAB — HIV-1 RNA QUANT-NO REFLEX-BLD
HIV 1 RNA QUANT: 26 {copies}/mL — AB (ref <20)
HIV-1 RNA Quant, Log: 1.41 {Log} — ABNORMAL HIGH (ref <1.30)

## 2013-10-18 LAB — T-HELPER CELL (CD4) - (RCID CLINIC ONLY)
CD4 T CELL HELPER: 29 % — AB (ref 33–55)
CD4 T Cell Abs: 890 /uL (ref 400–2700)

## 2013-10-24 ENCOUNTER — Other Ambulatory Visit (HOSPITAL_COMMUNITY)
Admission: RE | Admit: 2013-10-24 | Discharge: 2013-10-24 | Disposition: A | Discharge status: Home / Self Care | Payer: Medicaid Other | Source: Ambulatory Visit | Attending: Obstetrics and Gynecology | Admitting: Obstetrics and Gynecology

## 2013-10-24 ENCOUNTER — Ambulatory Visit (INDEPENDENT_AMBULATORY_CARE_PROVIDER_SITE_OTHER): Payer: Medicaid Other | Admitting: Obstetrics and Gynecology

## 2013-10-24 ENCOUNTER — Encounter: Payer: Self-pay | Admitting: Obstetrics and Gynecology

## 2013-10-24 VITALS — BP 118/80 | HR 92 | Temp 98.8°F | Ht 64.0 in | Wt 190.7 lb

## 2013-10-24 DIAGNOSIS — N870 Mild cervical dysplasia: Secondary | ICD-10-CM | POA: Insufficient documentation

## 2013-10-24 DIAGNOSIS — R6889 Other general symptoms and signs: Secondary | ICD-10-CM

## 2013-10-24 DIAGNOSIS — IMO0002 Reserved for concepts with insufficient information to code with codable children: Secondary | ICD-10-CM

## 2013-10-24 DIAGNOSIS — Z01812 Encounter for preprocedural laboratory examination: Secondary | ICD-10-CM

## 2013-10-24 DIAGNOSIS — R87612 Low grade squamous intraepithelial lesion on cytologic smear of cervix (LGSIL): Secondary | ICD-10-CM | POA: Insufficient documentation

## 2013-10-24 NOTE — Progress Notes (Signed)
Patient ID: [name], female   DOB: 11-Jan-1981, 33 y.o.   MRN: [id number] 33 yo with positive HIV and abnormal pap smear on 07/2013- LGSIL Patient given informed consent, signed copy in the chart, time out was performed.  Placed in lithotomy position. Cervix viewed with speculum and colposcope after application of acetic acid.   Colposcopy adequate?  TZ not visualized Acetowhite lesions?yes 4 and 9 o'clock Punctation?no Mosaicism?  no Abnormal vasculature?  no Biopsies?yes x 2  ECC?yes  COMMENTS: Patient was given post procedure instructions.  She will return in 2 weeks for results.

## 2013-10-25 LAB — POCT PREGNANCY, URINE: PREG TEST UR: NEGATIVE

## 2013-10-26 ENCOUNTER — Ambulatory Visit
Admission: RE | Admit: 2013-10-26 | Discharge: 2013-10-26 | Disposition: A | Discharge status: Home / Self Care | Payer: Medicaid Other | Source: Ambulatory Visit | Attending: Infectious Diseases | Admitting: Infectious Diseases

## 2013-10-26 ENCOUNTER — Telehealth: Payer: Self-pay | Admitting: *Deleted

## 2013-10-26 DIAGNOSIS — J852 Abscess of lung without pneumonia: Secondary | ICD-10-CM

## 2013-10-26 NOTE — Telephone Encounter (Addendum)
Message copied by Langston Reusing on Fri Oct 26, 2013 12:09 PM ------      Message from: CONSTANT, Vickii Chafe      Created: Fri Oct 26, 2013  8:16 AM       Please inform patient of colposcopy results consistent with pap smear. Remind patient to return in 6 months for repeat pap smear.  ------  Called pt and informed her of Colposcopy results as stated by Dr. Elly Modena.  Pt was advised she will need repeat Pap in September and should call our office in August to schedule the appt.  Pt voiced understanding of information and instructions given.   Diane Day RNC

## 2013-10-31 ENCOUNTER — Telehealth: Payer: Self-pay | Admitting: *Deleted

## 2013-10-31 DIAGNOSIS — J322 Chronic ethmoidal sinusitis: Secondary | ICD-10-CM

## 2013-10-31 NOTE — Telephone Encounter (Signed)
Pt called and her CXR reviewed. Her previous area of abscess is resolved.

## 2013-10-31 NOTE — Telephone Encounter (Signed)
Wanting to know chest xray results.  MD please call pt to discuss results.  [phone]

## 2013-11-01 MED ORDER — LEVOFLOXACIN 500 MG PO TABS: 500.0000 mg | ORAL_TABLET | Freq: Every day | ORAL | Status: DC

## 2013-11-01 NOTE — Addendum Note (Signed)
Addended by: Lorne Skeens D on: 11/01/2013 03:53 PM   Modules accepted: Orders

## 2013-11-01 NOTE — Telephone Encounter (Signed)
Can you give her 7 days of levaquin 500mg  daily? thanks

## 2013-11-01 NOTE — Telephone Encounter (Signed)
She does not have a PCP.  OK to bring in to Clinic MD?

## 2013-11-01 NOTE — Telephone Encounter (Signed)
See PCP

## 2013-11-01 NOTE — Telephone Encounter (Signed)
Continuing rxed nasal spray.  Pt wondering if there is anything else she should be doing.  MD please advise.

## 2013-11-01 NOTE — Telephone Encounter (Signed)
Pt notified that rx will be delivered by [facility].  Pt advised to drink plenty of liquids while taking this medication.  Pt verbalized understanding.

## 2013-11-01 NOTE — Telephone Encounter (Signed)
Pt now having "mustard" colored nasal drainage.  Completed Z-Pak 10/23/13.  Continuing to take Zyrtec

## 2013-11-05 ENCOUNTER — Encounter: Payer: Self-pay | Admitting: *Deleted

## 2013-11-09 ENCOUNTER — Other Ambulatory Visit: Payer: Self-pay | Admitting: Internal Medicine

## 2013-12-11 ENCOUNTER — Other Ambulatory Visit: Payer: Self-pay | Admitting: Internal Medicine

## 2013-12-11 ENCOUNTER — Other Ambulatory Visit: Payer: Self-pay | Admitting: Infectious Disease

## 2014-01-08 ENCOUNTER — Other Ambulatory Visit: Payer: Self-pay | Admitting: Internal Medicine

## 2014-02-05 ENCOUNTER — Other Ambulatory Visit: Payer: Self-pay | Admitting: Orthopedic Surgery

## 2014-02-05 ENCOUNTER — Ambulatory Visit
Admission: RE | Admit: 2014-02-05 | Discharge: 2014-02-05 | Disposition: A | Discharge status: Home / Self Care | Payer: Medicaid Other | Source: Ambulatory Visit | Attending: Orthopedic Surgery | Admitting: Orthopedic Surgery

## 2014-02-05 DIAGNOSIS — R52 Pain, unspecified: Secondary | ICD-10-CM

## 2014-03-12 ENCOUNTER — Emergency Department (HOSPITAL_COMMUNITY)
Admission: EM | Admit: 2014-03-12 | Discharge: 2014-03-12 | Disposition: A | Discharge status: Home / Self Care | Payer: Medicaid Other | Attending: Emergency Medicine | Admitting: Emergency Medicine

## 2014-03-12 ENCOUNTER — Encounter (HOSPITAL_COMMUNITY): Payer: Self-pay | Admitting: Emergency Medicine

## 2014-03-12 DIAGNOSIS — K219 Gastro-esophageal reflux disease without esophagitis: Secondary | ICD-10-CM | POA: Insufficient documentation

## 2014-03-12 DIAGNOSIS — Z21 Asymptomatic human immunodeficiency virus [HIV] infection status: Secondary | ICD-10-CM | POA: Diagnosis not present

## 2014-03-12 DIAGNOSIS — Z8619 Personal history of other infectious and parasitic diseases: Secondary | ICD-10-CM | POA: Insufficient documentation

## 2014-03-12 DIAGNOSIS — Z8739 Personal history of other diseases of the musculoskeletal system and connective tissue: Secondary | ICD-10-CM | POA: Insufficient documentation

## 2014-03-12 DIAGNOSIS — J069 Acute upper respiratory infection, unspecified: Secondary | ICD-10-CM | POA: Insufficient documentation

## 2014-03-12 DIAGNOSIS — H9209 Otalgia, unspecified ear: Secondary | ICD-10-CM | POA: Diagnosis present

## 2014-03-12 DIAGNOSIS — R51 Headache: Secondary | ICD-10-CM | POA: Insufficient documentation

## 2014-03-12 DIAGNOSIS — F3289 Other specified depressive episodes: Secondary | ICD-10-CM | POA: Insufficient documentation

## 2014-03-12 DIAGNOSIS — Z91040 Latex allergy status: Secondary | ICD-10-CM | POA: Insufficient documentation

## 2014-03-12 DIAGNOSIS — Z862 Personal history of diseases of the blood and blood-forming organs and certain disorders involving the immune mechanism: Secondary | ICD-10-CM | POA: Insufficient documentation

## 2014-03-12 DIAGNOSIS — J029 Acute pharyngitis, unspecified: Secondary | ICD-10-CM | POA: Insufficient documentation

## 2014-03-12 DIAGNOSIS — F329 Major depressive disorder, single episode, unspecified: Secondary | ICD-10-CM | POA: Insufficient documentation

## 2014-03-12 DIAGNOSIS — E119 Type 2 diabetes mellitus without complications: Secondary | ICD-10-CM | POA: Diagnosis not present

## 2014-03-12 DIAGNOSIS — IMO0002 Reserved for concepts with insufficient information to code with codable children: Secondary | ICD-10-CM | POA: Insufficient documentation

## 2014-03-12 DIAGNOSIS — B9789 Other viral agents as the cause of diseases classified elsewhere: Secondary | ICD-10-CM

## 2014-03-12 DIAGNOSIS — F411 Generalized anxiety disorder: Secondary | ICD-10-CM | POA: Insufficient documentation

## 2014-03-12 DIAGNOSIS — J45909 Unspecified asthma, uncomplicated: Secondary | ICD-10-CM | POA: Diagnosis not present

## 2014-03-12 DIAGNOSIS — K089 Disorder of teeth and supporting structures, unspecified: Secondary | ICD-10-CM | POA: Insufficient documentation

## 2014-03-12 DIAGNOSIS — Z79899 Other long term (current) drug therapy: Secondary | ICD-10-CM | POA: Diagnosis not present

## 2014-03-12 LAB — RAPID STREP SCREEN (MED CTR MEBANE ONLY): STREPTOCOCCUS, GROUP A SCREEN (DIRECT): NEGATIVE

## 2014-03-12 MED ORDER — MAGIC MOUTHWASH W/LIDOCAINE: 5.0000 mL | Freq: Four times a day (QID) | ORAL | Status: DC | PRN

## 2014-03-12 MED ORDER — CETIRIZINE-PSEUDOEPHEDRINE ER 5-120 MG PO TB12: 1.0000 | ORAL_TABLET | Freq: Two times a day (BID) | ORAL | Status: DC

## 2014-03-12 NOTE — ED Notes (Signed)
P tc/o left lower dental pain, bilateral ear pain and headache that started yesterday.

## 2014-03-12 NOTE — Discharge Instructions (Signed)
Upper Respiratory Infection, Adult An upper respiratory infection (URI) is also known as the common cold. It is often caused by a type of germ (virus). Colds are easily spread (contagious). You can pass it to others by kissing, coughing, sneezing, or drinking out of the same glass. Usually, you get better in 1 or 2 weeks.  HOME CARE   Only take medicine as told by your doctor.  Use a warm mist humidifier or breathe in steam from a hot shower.  Drink enough water and fluids to keep your pee (urine) clear or pale yellow.  Get plenty of rest.  Return to work when your temperature is back to normal or as told by your doctor. You may use a face mask and wash your hands to stop your cold from spreading. GET HELP RIGHT AWAY IF:   After the first few days, you feel you are getting worse.  You have questions about your medicine.  You have chills, shortness of breath, or brown or red spit (mucus).  You have yellow or brown snot (nasal discharge) or pain in the face, especially when you bend forward.  You have a fever, puffy (swollen) neck, pain when you swallow, or white spots in the back of your throat.  You have a bad headache, ear pain, sinus pain, or chest pain.  You have a high-pitched whistling sound when you breathe in and out (wheezing).  You have a lasting cough or cough up blood.  You have sore muscles or a stiff neck. MAKE SURE YOU:   Understand these instructions.  Will watch your condition.  Will get help right away if you are not doing well or get worse. Document Released: 01/19/2008 Document Revised: 10/25/2011 Document Reviewed: 11/07/2013 [facility] Patient Information 2015 [place], [place]. This information is not intended to replace advice given to you by your health care provider. Make sure you discuss any questions you have with your health care provider. Pharyngitis Pharyngitis is a sore throat (pharynx). There is redness, pain, and swelling of your throat. HOME  CARE   Drink enough fluids to keep your pee (urine) clear or pale yellow.  Only take medicine as told by your doctor.  You may get sick again if you do not take medicine as told. Finish your medicines, even if you start to feel better.  Do not take aspirin.  Rest.  Rinse your mouth (gargle) with salt water ( tsp of salt per 1 qt of water) every 1-2 hours. This will help the pain.  If you are not at risk for choking, you can suck on hard candy or sore throat lozenges. GET HELP IF:  You have large, tender lumps on your neck.  You have a rash.  You cough up green, yellow-brown, or bloody spit. GET HELP RIGHT AWAY IF:   You have a stiff neck.  You drool or cannot swallow liquids.  You throw up (vomit) or are not able to keep medicine or liquids down.  You have very bad pain that does not go away with medicine.  You have problems breathing (not from a stuffy nose). MAKE SURE YOU:   Understand these instructions.  Will watch your condition.  Will get help right away if you are not doing well or get worse. Document Released: 01/19/2008 Document Revised: 05/23/2013 Document Reviewed: 04/09/2013 [facility] Patient Information 2015 [place], [place]. This information is not intended to replace advice given to you by your health care provider. Make sure you discuss any questions you have  with your health care provider. Salt Water Gargle This solution will help make your mouth and throat feel better. HOME CARE INSTRUCTIONS   Mix 1 teaspoon of salt in 8 ounces of warm water.  Gargle with this solution as much or often as you need or as directed. Swish and gargle gently if you have any sores or wounds in your mouth.  Do not swallow this mixture. Document Released: 05/06/2004 Document Revised: 10/25/2011 Document Reviewed: 09/27/2008 [facility] Patient Information 2015 [place], [place]. This information is not intended to replace advice given to you by your health care provider. Make  sure you discuss any questions you have with your health care provider.

## 2014-03-12 NOTE — ED Provider Notes (Signed)
CSN: [id number]     Arrival date & time 03/12/14  1812 History  This chart was scribed for non-physician provider Antonietta Breach, PA-C, working with Ezequiel Essex, MD by Irene Pap, ED Scribe. This patient was seen in room WTR7/WTR7 and patient care was started at 8:29 PM.    Chief Complaint  Patient presents with  . Otalgia  . Dental Pain  . Headache   Patient is a 33 y.o. female presenting with ear pain, tooth pain, and headaches. The history is provided by the patient. No language interpreter was used.  Otalgia Location:  Bilateral Onset quality:  Gradual Duration:  3 days Chronicity:  New Ineffective treatments:  OTC medications Associated symptoms: cough, headaches and sore throat   Associated symptoms: no ear discharge and no fever   Dental Pain Associated symptoms: headaches   Associated symptoms: no fever   Headache Associated symptoms: cough, drainage, ear pain and sore throat   Associated symptoms: no fever    HPI Comments: [name] is a 33 y.o. female who presents to the Emergency Department complaining of sore throat, headache, otalgia and pain with swallowing onset 3 days ago. She states that she has been taking ibuprofen to no relief. She reports associated drainage in her throat, dry cough and swollen lymph nodes. She denies fever, difficulty swallowing, drainage from ears, or sick contacts. She states that she has a history of seasonal allergies for which she takes Zyrtec daily. She does report that her allergies have been acting up recently.    Past Medical History  Diagnosis Date  . HIV infection   . Anxiety   . Depression   . Asthma   . Diabetes mellitus without complication     no meds currently- controlled with diet  . GERD (gastroesophageal reflux disease)   . Neuromuscular disorder     carpal tunnel in both arms  . Human immunodeficiency virus (HIV) disease 12/07/2012  . Herpes genitalis in women 12/07/2012  . Bipolar disorder, unspecified  12/07/2012  . Anxiety state, unspecified 12/07/2012  . Type II or unspecified type diabetes mellitus without mention of complication, not stated as uncontrolled 12/07/2012  . Unspecified asthma(493.90) 12/07/2012  . GERD (gastroesophageal reflux disease) 12/07/2012  . Anemia   . Arthritis   . Allergy   . Headache(784.0)     migraines   Past Surgical History  Procedure Laterality Date  . Cesarean section    . Robotic assisted bilateral salpingo oopherectomy Right 12/04/2012    Procedure: ROBOTIC ASSISTED Right  SALPINGO OOPHORECTOMY;  Surgeon: [name], MD;  Location: [facility];  Service: Gynecology;  Laterality: Right;  . Laparotomy N/A 12/04/2012    Procedure: EXPLORATORY LAPAROTOMY;  Surgeon: [name], MD;  Location: [facility];  Service: Gynecology;  Laterality: N/A;  repair of serosal injury  . Laparoscopic lysis of adhesions N/A 12/04/2012    Procedure: LAPAROSCOPIC LYSIS OF ADHESIONS;  Surgeon: [name], MD;  Location: [facility];  Service: Gynecology;  Laterality: N/A;  . Colon surgery      colon repair after salpingo  . Laparoscopic hepatectomy N/A 06/28/2013    Procedure: LAPAROSCOPIC EXCISION HEPATIC MASS;  Surgeon: Stark Klein, MD;  Location: [facility];  Service: General;  Laterality: N/A;   Family History  Problem Relation Age of Onset  . Arthritis/Rheumatoid Mother   . Hypertension Mother   . Cancer Maternal Aunt     brand and lung  . Cancer Maternal Grandmother     breasts  . Cancer Paternal  Grandmother     breast and kidney   History  Substance Use Topics  . Smoking status: Never Smoker   . Smokeless tobacco: Never Used     Comment: occ alcohol  . Alcohol Use: Yes     Comment: OCC.   OB History   Grav Para Term Preterm Abortions TAB SAB Ect Mult Living   3 1 1  0 2 1 1  0 0 1      Review of Systems  Constitutional: Negative for fever.  HENT: Positive for ear pain, postnasal drip and sore throat. Negative for ear discharge and trouble  swallowing.   Respiratory: Positive for cough.   Neurological: Positive for headaches.    Allergies  Benzoin; Latex; and Sulfamethoxazole-trimethoprim  Home Medications   Prior to Admission medications   Medication Sig Start Date End Date Taking? Authorizing Provider  adapalene (DIFFERIN) 0.1 % gel Apply 1 application topically at bedtime.    Historical Provider, MD  albuterol (PROVENTIL HFA;VENTOLIN HFA) 108 (90 BASE) MCG/ACT inhaler Inhale 2 puffs into the lungs every 6 (six) hours as needed for wheezing.    Historical Provider, MD  Alum & Mag Hydroxide-Simeth (MAGIC MOUTHWASH W/LIDOCAINE) SOLN Take 5 mLs by mouth 4 (four) times daily as needed for mouth pain. 03/12/14   Antonietta Breach, PA-C  amitriptyline (ELAVIL) 25 MG tablet Take 25 mg by mouth at bedtime.    Historical Provider, MD  ARIPiprazole (ABILIFY) 5 MG tablet Take 5 mg by mouth daily.    Historical Provider, MD  azithromycin (ZITHROMAX Z-PAK) 250 MG tablet 2 tab on day 1, 1 tab on days 2-5 10/17/13   Campbell Riches, MD  benztropine (COGENTIN) 0.5 MG tablet Take 0.5 mg by mouth at bedtime.    Historical Provider, MD  cetirizine (ZYRTEC) 10 MG tablet Take 10 mg by mouth daily.    Historical Provider, MD  cetirizine-pseudoephedrine (ZYRTEC-D) 5-120 MG per tablet Take 1 tablet by mouth 2 (two) times daily. 03/12/14   Antonietta Breach, PA-C  Darunavir Ethanolate (PREZISTA) 800 MG tablet Take 1 tablet (800 mg total) by mouth daily. 05/17/13   Thayer Headings, MD  DULoxetine (CYMBALTA) 30 MG capsule Take 60 mg by mouth daily.     Historical Provider, MD  flunisolide (NASAREL) 29 MCG/ACT (0.025%) nasal spray Place 2 sprays into the nose at bedtime. Dose is for each nostril. 11/13/12   Truman Hayward, MD  levofloxacin (LEVAQUIN) 500 MG tablet Take 1 tablet (500 mg total) by mouth daily. 11/01/13   Campbell Riches, MD  lithium carbonate (ESKALITH) 450 MG CR tablet Take 450 mg by mouth. Take 1 tablet in the morning. Take 2 tablets at night.     Historical Provider, MD  montelukast (SINGULAIR) 10 MG tablet Take 10 mg by mouth at bedtime.    Historical Provider, MD  NORVIR 100 MG TABS tablet TAKE 1 TABLET (100 MG TOTAL) BY MOUTH DAILY. 01/08/14   Thayer Headings, MD  omeprazole (PRILOSEC) 40 MG capsule Take 1 capsule (40 mg total) by mouth daily. 04/19/13   Jerene Bears, MD  ondansetron (ZOFRAN ODT) 4 MG disintegrating tablet Take 1 tablet (4 mg total) by mouth every 8 (eight) hours as needed for nausea. 04/19/13   Jerene Bears, MD  pramoxine-hydrocortisone [facility]) 1-1 % rectal cream Place rectally 2 (two) times daily. 04/19/13   Jerene Bears, MD  PREZISTA 800 MG tablet TAKE 1 TABLET (800 MG TOTAL) BY MOUTH DAILY. 12/11/13   Dellis Filbert  Chevis Pretty, MD  pseudoephedrine (SUDAFED) 120 MG 12 hr tablet Take 120 mg by mouth every 12 (twelve) hours as needed for congestion.    Historical Provider, MD  QVAR 40 MCG/ACT inhaler INHALE TWO   PUFFS INTO THE LUNGS TWO   TIMES DAILY. 01/08/14   Thayer Headings, MD  SUMAtriptan (IMITREX) 100 MG tablet Take 100 mg by mouth every 2 (two) hours as needed for migraine.    Historical Provider, MD  TIVICAY 50 MG tablet TAKE 1 TABLET (50 MG TOTAL) BY MOUTH DAILY. 12/11/13   Campbell Riches, MD  TRUVADA 200-300 MG per tablet TAKE 1 TABLET BY MOUTH DAILY. 12/11/13   Campbell Riches, MD  valACYclovir (VALTREX) 500 MG tablet TAKE 1 TABLET (500 MG TOTAL) BY MOUTH DAILY. 12/11/13   Campbell Riches, MD  vitamin B-12 (CYANOCOBALAMIN) 1000 MCG tablet Take 1,000 mcg by mouth daily.    Historical Provider, MD   BP 121/75  Pulse 76  Temp(Src) 98.4 F (36.9 C) (Oral)  Resp 16  SpO2 100%  Physical Exam  Nursing note and vitals reviewed. Constitutional: She is oriented to Neal, place, and time. She appears well-developed and well-nourished. No distress.  Nontoxic/nonseptic appearing  HENT:  Head: Normocephalic and atraumatic.  Right Ear: Hearing, tympanic membrane, external ear and ear canal normal.  Left Ear: Hearing,  tympanic membrane, external ear and ear canal normal.  Nose: Nose normal.  Mouth/Throat: Uvula is midline and mucous membranes are normal. Posterior oropharyngeal erythema present. No oropharyngeal exudate or posterior oropharyngeal edema.  Mild posterior oropharyngeal erythema. Mild tonsillar enlargement bilaterally. No exudates. Uvula midline and patient tolerating secretions without difficulty.  Eyes: Conjunctivae and EOM are normal. Pupils are equal, round, and reactive to light. No scleral icterus.  Neck: Normal range of motion. Neck supple.  No nuchal rigidity or meningismus  Cardiovascular: Normal rate, regular rhythm and normal heart sounds.   Pulmonary/Chest: Effort normal and breath sounds normal. No respiratory distress. She has no wheezes. She has no rales.  No tachypnea or dyspnea. Chest expansion symmetric.  Musculoskeletal: Normal range of motion.  Lymphadenopathy:    She has cervical adenopathy (Mild anterior cervical, tender to palpation.).  Neurological: She is alert and oriented to Neal, place, and time. She exhibits normal muscle tone. Coordination normal.  GCS 15. Patient moves extremities without ataxia.  Skin: Skin is warm and dry. No rash noted. She is not diaphoretic. No erythema. No pallor.  Psychiatric: She has a normal mood and affect. Her behavior is normal.    ED Course  Procedures (including critical care time) DIAGNOSTIC STUDIES: Oxygen Saturation is 100% on room air, normal by my interpretation.    COORDINATION OF CARE: 8:30 PM-Discussed treatment plan which includes ear drops, Zyrtec D, and pain medication with pt at bedside and pt agreed to plan.   Labs Review Labs Reviewed  RAPID STREP SCREEN  CULTURE, GROUP A STREP   Imaging Review No results found.   EKG Interpretation None      MDM   Final diagnoses:  Viral URI with cough  Viral pharyngitis    Pt afebrile without tonsillar exudate, negative strep. Presents with mild cervical  lymphadenopathy, and dysphagia; diagnosis of viral pharyngitis. No abx indicated. Will d/c with symptomatic tx for pain. Presentation non concerning for PTA or infxn spread to soft tissue. No trismus or uvula deviation. Patient also with upper respiratory symptoms and cough, also consistent with viral illness. Will manage symptoms with Zyrtec-D. Have advised the  patient to discontinue the Zyrtec when taking this medication. Nasal congestion likely the cause of patient's ear pain as she has no signs of otitis media bilaterally. No nuchal rigidity or meningismus. Patient stable and appropriate for d/c with instruction to f/u with her PCP. Return precautions discussed and provided. Patient agreeable to plan with no unaddressed concerns.  I personally performed the services described in this documentation, which was scribed in my presence. The recorded information has been reviewed and is accurate.   Filed Vitals:   03/12/14 1829  BP: 121/75  Pulse: 76  Temp: 98.4 F (36.9 C)  Resp: [address], [place] 03/12/14 2101

## 2014-03-12 NOTE — ED Notes (Signed)
Pt reports L lower toothache, h/a, bila ear pain and sore throat x 2 days.  Denies any fever at this time.

## 2014-03-13 NOTE — ED Provider Notes (Signed)
Medical screening examination/treatment/procedure(s) were performed by non-physician practitioner and as supervising physician I was immediately available for consultation/collaboration.   EKG Interpretation None        Ezequiel Essex, MD 03/13/14 0005

## 2014-03-14 LAB — CULTURE, GROUP A STREP

## 2014-04-04 ENCOUNTER — Other Ambulatory Visit: Payer: Self-pay | Admitting: Internal Medicine

## 2014-04-08 ENCOUNTER — Other Ambulatory Visit: Payer: Self-pay | Admitting: Infectious Diseases

## 2014-04-08 ENCOUNTER — Other Ambulatory Visit: Payer: Self-pay | Admitting: Internal Medicine

## 2014-04-10 ENCOUNTER — Other Ambulatory Visit: Payer: Medicaid Other

## 2014-04-23 ENCOUNTER — Other Ambulatory Visit (INDEPENDENT_AMBULATORY_CARE_PROVIDER_SITE_OTHER): Payer: Medicaid Other

## 2014-04-23 ENCOUNTER — Other Ambulatory Visit (HOSPITAL_COMMUNITY)
Admission: RE | Admit: 2014-04-23 | Discharge: 2014-04-23 | Disposition: A | Discharge status: Home / Self Care | Payer: Medicaid Other | Source: Ambulatory Visit | Attending: Infectious Diseases | Admitting: Infectious Diseases

## 2014-04-23 DIAGNOSIS — Z113 Encounter for screening for infections with a predominantly sexual mode of transmission: Secondary | ICD-10-CM

## 2014-04-23 DIAGNOSIS — K219 Gastro-esophageal reflux disease without esophagitis: Secondary | ICD-10-CM

## 2014-04-23 DIAGNOSIS — B20 Human immunodeficiency virus [HIV] disease: Secondary | ICD-10-CM

## 2014-04-23 DIAGNOSIS — J984 Other disorders of lung: Secondary | ICD-10-CM

## 2014-04-23 DIAGNOSIS — Z23 Encounter for immunization: Secondary | ICD-10-CM

## 2014-04-23 DIAGNOSIS — K769 Liver disease, unspecified: Secondary | ICD-10-CM

## 2014-04-23 DIAGNOSIS — J45909 Unspecified asthma, uncomplicated: Secondary | ICD-10-CM

## 2014-04-23 LAB — CBC WITH DIFFERENTIAL/PLATELET
Basophils Absolute: 0.1 10*3/uL (ref 0.0–0.1)
Basophils Relative: 1 % (ref 0–1)
EOS PCT: 1 % (ref 0–5)
Eosinophils Absolute: 0.1 10*3/uL (ref 0.0–0.7)
HCT: 40.2 % (ref 36.0–46.0)
Hemoglobin: 13.9 g/dL (ref 12.0–15.0)
LYMPHS ABS: 3.5 10*3/uL (ref 0.7–4.0)
LYMPHS PCT: 40 % (ref 12–46)
MCH: 31.7 pg (ref 26.0–34.0)
MCHC: 34.6 g/dL (ref 30.0–36.0)
MCV: 91.8 fL (ref 78.0–100.0)
Monocytes Absolute: 0.5 10*3/uL (ref 0.1–1.0)
Monocytes Relative: 6 % (ref 3–12)
NEUTROS ABS: 4.5 10*3/uL (ref 1.7–7.7)
Neutrophils Relative %: 52 % (ref 43–77)
PLATELETS: 291 10*3/uL (ref 150–400)
RBC: 4.38 MIL/uL (ref 3.87–5.11)
RDW: 13.6 % (ref 11.5–15.5)
WBC: 8.7 10*3/uL (ref 4.0–10.5)

## 2014-04-23 NOTE — Addendum Note (Signed)
Addended by: Jarrett Ables D on: 04/23/2014 03:15 PM   Modules accepted: Orders

## 2014-04-24 ENCOUNTER — Ambulatory Visit: Payer: Medicaid Other | Admitting: Infectious Diseases

## 2014-04-24 LAB — COMPLETE METABOLIC PANEL WITH GFR
ALBUMIN: 4.1 g/dL (ref 3.5–5.2)
ALT: 11 U/L (ref 0–35)
AST: 15 U/L (ref 0–37)
Alkaline Phosphatase: 97 U/L (ref 39–117)
BUN: 8 mg/dL (ref 6–23)
CALCIUM: 9.3 mg/dL (ref 8.4–10.5)
CHLORIDE: 106 meq/L (ref 96–112)
CO2: 26 meq/L (ref 19–32)
Creat: 1.16 mg/dL — ABNORMAL HIGH (ref 0.50–1.10)
GFR, EST AFRICAN AMERICAN: 71 mL/min
GFR, Est Non African American: 62 mL/min
Glucose, Bld: 85 mg/dL (ref 70–99)
POTASSIUM: 3.8 meq/L (ref 3.5–5.3)
Sodium: 139 mEq/L (ref 135–145)
TOTAL PROTEIN: 6.5 g/dL (ref 6.0–8.3)
Total Bilirubin: 0.4 mg/dL (ref 0.2–1.2)

## 2014-04-24 LAB — T-HELPER CELL (CD4) - (RCID CLINIC ONLY)
CD4 % Helper T Cell: 34 % (ref 33–55)
CD4 T Cell Abs: 1200 /uL (ref 400–2700)

## 2014-04-24 LAB — HIV-1 RNA ULTRAQUANT REFLEX TO GENTYP+
HIV 1 RNA Quant: <20 copies/mL (ref <20)
HIV-1 RNA Quant, Log: <1.3 {Log} (ref <1.30)

## 2014-05-13 ENCOUNTER — Emergency Department (HOSPITAL_COMMUNITY)
Admission: EM | Admit: 2014-05-13 | Discharge: 2014-05-13 | Disposition: A | Discharge status: Home / Self Care | Payer: Medicaid Other | Attending: Emergency Medicine | Admitting: Emergency Medicine

## 2014-05-13 ENCOUNTER — Emergency Department (HOSPITAL_COMMUNITY): Payer: Medicaid Other

## 2014-05-13 ENCOUNTER — Encounter (HOSPITAL_COMMUNITY): Payer: Self-pay | Admitting: Emergency Medicine

## 2014-05-13 DIAGNOSIS — Z79899 Other long term (current) drug therapy: Secondary | ICD-10-CM | POA: Diagnosis not present

## 2014-05-13 DIAGNOSIS — IMO0002 Reserved for concepts with insufficient information to code with codable children: Secondary | ICD-10-CM | POA: Insufficient documentation

## 2014-05-13 DIAGNOSIS — S239XXA Sprain of unspecified parts of thorax, initial encounter: Secondary | ICD-10-CM | POA: Insufficient documentation

## 2014-05-13 DIAGNOSIS — F319 Bipolar disorder, unspecified: Secondary | ICD-10-CM | POA: Diagnosis not present

## 2014-05-13 DIAGNOSIS — M129 Arthropathy, unspecified: Secondary | ICD-10-CM | POA: Insufficient documentation

## 2014-05-13 DIAGNOSIS — W19XXXA Unspecified fall, initial encounter: Secondary | ICD-10-CM | POA: Diagnosis not present

## 2014-05-13 DIAGNOSIS — F411 Generalized anxiety disorder: Secondary | ICD-10-CM | POA: Diagnosis not present

## 2014-05-13 DIAGNOSIS — Y939 Activity, unspecified: Secondary | ICD-10-CM | POA: Diagnosis not present

## 2014-05-13 DIAGNOSIS — Z8619 Personal history of other infectious and parasitic diseases: Secondary | ICD-10-CM | POA: Diagnosis not present

## 2014-05-13 DIAGNOSIS — K219 Gastro-esophageal reflux disease without esophagitis: Secondary | ICD-10-CM | POA: Insufficient documentation

## 2014-05-13 DIAGNOSIS — Z21 Asymptomatic human immunodeficiency virus [HIV] infection status: Secondary | ICD-10-CM | POA: Diagnosis not present

## 2014-05-13 DIAGNOSIS — J45909 Unspecified asthma, uncomplicated: Secondary | ICD-10-CM | POA: Diagnosis not present

## 2014-05-13 DIAGNOSIS — Z91040 Latex allergy status: Secondary | ICD-10-CM | POA: Diagnosis not present

## 2014-05-13 DIAGNOSIS — E119 Type 2 diabetes mellitus without complications: Secondary | ICD-10-CM | POA: Diagnosis not present

## 2014-05-13 DIAGNOSIS — Z792 Long term (current) use of antibiotics: Secondary | ICD-10-CM | POA: Diagnosis not present

## 2014-05-13 DIAGNOSIS — G709 Myoneural disorder, unspecified: Secondary | ICD-10-CM | POA: Diagnosis not present

## 2014-05-13 DIAGNOSIS — G43909 Migraine, unspecified, not intractable, without status migrainosus: Secondary | ICD-10-CM | POA: Insufficient documentation

## 2014-05-13 DIAGNOSIS — D649 Anemia, unspecified: Secondary | ICD-10-CM | POA: Diagnosis not present

## 2014-05-13 DIAGNOSIS — Y929 Unspecified place or not applicable: Secondary | ICD-10-CM | POA: Diagnosis not present

## 2014-05-13 DIAGNOSIS — T148XXA Other injury of unspecified body region, initial encounter: Secondary | ICD-10-CM

## 2014-05-13 LAB — URINALYSIS, ROUTINE W REFLEX MICROSCOPIC
Glucose, UA: NEGATIVE mg/dL
KETONES UR: NEGATIVE mg/dL
Leukocytes, UA: NEGATIVE
Nitrite: NEGATIVE
PH: 5 (ref 5.0–8.0)
Protein, ur: NEGATIVE mg/dL
Specific Gravity, Urine: 1.024 (ref 1.005–1.030)
Urobilinogen, UA: 1 mg/dL (ref 0.0–1.0)

## 2014-05-13 LAB — URINE MICROSCOPIC-ADD ON

## 2014-05-13 MED ORDER — OXYCODONE-ACETAMINOPHEN 5-325 MG PO TABS: 1.0000 | ORAL_TABLET | Freq: Four times a day (QID) | ORAL | Status: DC | PRN

## 2014-05-13 MED ORDER — IBUPROFEN 800 MG PO TABS: 800.0000 mg | ORAL_TABLET | Freq: Three times a day (TID) | ORAL | Status: DC

## 2014-05-13 NOTE — Discharge Instructions (Signed)
Call for a follow up appointment with a Family or Primary Care Provider.  Return if Symptoms worsen.   Take medication as prescribed.  Ice your back 3-4 times a day.  Do some stretching to the back (see attached). Do not operate heavy machinery or drink alcohol while taking narcotic pain medication.

## 2014-05-13 NOTE — ED Notes (Signed)
Pt sts having upper back pain since Friday. sts she did have a fall last week. sts that is hurts when she moves, breathes and unable to lay on back.

## 2014-05-13 NOTE — ED Provider Notes (Signed)
Medical screening examination/treatment/procedure(s) were conducted as a shared visit with non-physician practitioner(s) and myself.  I personally evaluated the patient during the encounter  Please see my separate respective documentation pertaining to this patient encounter   Johnna Acosta, MD 05/13/14 [phone]

## 2014-05-13 NOTE — ED Provider Notes (Signed)
CSN: [id number]     Arrival date & time 05/13/14  0825 History   First MD Initiated Contact with Patient 05/13/14 [phone]     Chief Complaint  Patient presents with  . Back Pain     (Consider location/radiation/quality/duration/timing/severity/associated sxs/prior Treatment) HPI Comments: Patient is a 33 year old female past no history of HIV, asthma, diabetes present emergency room chief complaint of left upper back pain for 4 days.  The patient reports Discomfort is worsened by movement, positional, and one episode of discomfort with deep inspiration.  She denies chest pain, shortness of breath, abdominal pain, nausea, vomiting, diarrhea, constipation, or urinary symptoms. Patient's last menstrual period was 05/13/2014. PCP: Elyn Peers, MD ID:   The history is provided by the patient. No language interpreter was used.    Past Medical History  Diagnosis Date  . HIV infection   . Anxiety   . Depression   . Asthma   . Diabetes mellitus without complication     no meds currently- controlled with diet  . GERD (gastroesophageal reflux disease)   . Neuromuscular disorder     carpal tunnel in both arms  . Human immunodeficiency virus (HIV) disease 12/07/2012  . Herpes genitalis in women 12/07/2012  . Bipolar disorder, unspecified 12/07/2012  . Anxiety state, unspecified 12/07/2012  . Type II or unspecified type diabetes mellitus without mention of complication, not stated as uncontrolled 12/07/2012  . Unspecified asthma(493.90) 12/07/2012  . GERD (gastroesophageal reflux disease) 12/07/2012  . Anemia   . Arthritis   . Allergy   . Headache(784.0)     migraines   Past Surgical History  Procedure Laterality Date  . Cesarean section    . Robotic assisted bilateral salpingo oopherectomy Right 12/04/2012    Procedure: ROBOTIC ASSISTED Right  SALPINGO OOPHORECTOMY;  Surgeon: [name], MD;  Location: [facility];  Service: Gynecology;  Laterality: Right;  . Laparotomy N/A 12/04/2012   Procedure: EXPLORATORY LAPAROTOMY;  Surgeon: [name], MD;  Location: [facility];  Service: Gynecology;  Laterality: N/A;  repair of serosal injury  . Laparoscopic lysis of adhesions N/A 12/04/2012    Procedure: LAPAROSCOPIC LYSIS OF ADHESIONS;  Surgeon: [name], MD;  Location: [facility];  Service: Gynecology;  Laterality: N/A;  . Colon surgery      colon repair after salpingo  . Laparoscopic hepatectomy N/A 06/28/2013    Procedure: LAPAROSCOPIC EXCISION HEPATIC MASS;  Surgeon: Stark Klein, MD;  Location: [facility];  Service: General;  Laterality: N/A;   Family History  Problem Relation Age of Onset  . Arthritis/Rheumatoid Mother   . Hypertension Mother   . Cancer Maternal Aunt     brand and lung  . Cancer Maternal Grandmother     breasts  . Cancer Paternal Grandmother     breast and kidney   History  Substance Use Topics  . Smoking status: Never Smoker   . Smokeless tobacco: Never Used     Comment: occ alcohol  . Alcohol Use: Yes     Comment: OCC.   OB History   Grav Para Term Preterm Abortions TAB SAB Ect Mult Living   3 1 1  0 2 1 1  0 0 1     Review of Systems  Constitutional: Negative for fever and chills.  Respiratory: Negative for chest tightness and shortness of breath.   Cardiovascular: Negative for chest pain.  Gastrointestinal: Negative for nausea, vomiting, abdominal pain, diarrhea and constipation.  Genitourinary: Negative for dysuria and hematuria.  Musculoskeletal: Positive for back pain.  Allergies  Benzoin; Latex; and Sulfamethoxazole-trimethoprim  Home Medications   Prior to Admission medications   Medication Sig Start Date End Date Taking? Authorizing Provider  adapalene (DIFFERIN) 0.1 % gel Apply 1 application topically at bedtime.    Historical Provider, MD  albuterol (PROVENTIL HFA;VENTOLIN HFA) 108 (90 BASE) MCG/ACT inhaler Inhale 2 puffs into the lungs every 6 (six) hours as needed for wheezing.    Historical Provider, MD  Alum &  Mag Hydroxide-Simeth (MAGIC MOUTHWASH W/LIDOCAINE) SOLN Take 5 mLs by mouth 4 (four) times daily as needed for mouth pain. 03/12/14   Antonietta Breach, PA-C  amitriptyline (ELAVIL) 25 MG tablet Take 25 mg by mouth at bedtime.    Historical Provider, MD  ARIPiprazole (ABILIFY) 5 MG tablet Take 5 mg by mouth daily.    Historical Provider, MD  azithromycin (ZITHROMAX Z-PAK) 250 MG tablet 2 tab on day 1, 1 tab on days 2-5 10/17/13   Campbell Riches, MD  benztropine (COGENTIN) 0.5 MG tablet Take 0.5 mg by mouth at bedtime.    Historical Provider, MD  cetirizine (ZYRTEC) 10 MG tablet Take 10 mg by mouth daily.    Historical Provider, MD  cetirizine-pseudoephedrine (ZYRTEC-D) 5-120 MG per tablet Take 1 tablet by mouth 2 (two) times daily. 03/12/14   Antonietta Breach, PA-C  Darunavir Ethanolate (PREZISTA) 800 MG tablet Take 1 tablet (800 mg total) by mouth daily. 05/17/13   Thayer Headings, MD  DULoxetine (CYMBALTA) 30 MG capsule Take 60 mg by mouth daily.     Historical Provider, MD  flunisolide (NASAREL) 29 MCG/ACT (0.025%) nasal spray Place 2 sprays into the nose at bedtime. Dose is for each nostril. 11/13/12   Truman Hayward, MD  levofloxacin (LEVAQUIN) 500 MG tablet Take 1 tablet (500 mg total) by mouth daily. 11/01/13   Campbell Riches, MD  lithium carbonate (ESKALITH) 450 MG CR tablet Take 450 mg by mouth. Take 1 tablet in the morning. Take 2 tablets at night.    Historical Provider, MD  montelukast (SINGULAIR) 10 MG tablet Take 10 mg by mouth at bedtime.    Historical Provider, MD  NORVIR 100 MG TABS tablet TAKE 1 TABLET (100 MG TOTAL) BY MOUTH DAILY. 04/08/14   Campbell Riches, MD  omeprazole (PRILOSEC) 40 MG capsule Take 1 capsule (40 mg total) by mouth daily. 04/04/14   Jerene Bears, MD  ondansetron (ZOFRAN ODT) 4 MG disintegrating tablet Take 1 tablet (4 mg total) by mouth every 8 (eight) hours as needed for nausea. 04/19/13   Jerene Bears, MD  pramoxine-hydrocortisone [facility]) 1-1 % rectal cream Place  rectally 2 (two) times daily. 04/19/13   Jerene Bears, MD  PREZISTA 800 MG tablet TAKE 1 TABLET (800 MG TOTAL) BY MOUTH DAILY. 04/08/14   Campbell Riches, MD  pseudoephedrine (SUDAFED) 120 MG 12 hr tablet Take 120 mg by mouth every 12 (twelve) hours as needed for congestion.    Historical Provider, MD  QVAR 40 MCG/ACT inhaler INHALE TWO   PUFFS INTO THE LUNGS TWO   TIMES DAILY. 04/08/14   Campbell Riches, MD  SUMAtriptan (IMITREX) 100 MG tablet Take 100 mg by mouth every 2 (two) hours as needed for migraine.    Historical Provider, MD  TIVICAY 50 MG tablet TAKE 1 TABLET (50 MG TOTAL) BY MOUTH DAILY. 04/08/14   Campbell Riches, MD  TRUVADA 200-300 MG per tablet TAKE 1 TABLET BY MOUTH DAILY. 04/08/14   Campbell Riches, MD  valACYclovir (VALTREX) 500 MG tablet TAKE 1 TABLET (500 MG TOTAL) BY MOUTH DAILY. 04/08/14   Campbell Riches, MD  vitamin B-12 (CYANOCOBALAMIN) 1000 MCG tablet Take 1,000 mcg by mouth daily.    Historical Provider, MD   BP 105/69  Pulse 78  Temp(Src) 97.9 F (36.6 C) (Oral)  Resp 20  Ht 5\' 4"  (1.626 m)  Wt 186 lb (84.369 kg)  BMI 31.91 kg/m2  SpO2 99%  LMP 05/13/2014 Physical Exam  Nursing note and vitals reviewed. Constitutional: She is oriented to person, place, and time. She appears well-developed and well-nourished. No distress.  HENT:  Head: Normocephalic and atraumatic.  Eyes: EOM are normal.  Neck: Neck supple.  Cardiovascular: Normal rate and regular rhythm.   Pulmonary/Chest: Effort normal and breath sounds normal. No respiratory distress. She has no wheezes. She has no rales.  Abdominal: Soft. Bowel sounds are normal. There is no tenderness. There is no rebound and no guarding.  Musculoskeletal:  Full active ROM, discomfort with movement. Non-tender to palpation to midline, or paraspinal soft tissue.  Neurological: She is alert and oriented to person, place, and time.  Skin: Skin is warm. She is not diaphoretic.  Psychiatric: She has a normal mood and  affect. Her behavior is normal.    ED Course  Procedures (including critical care time) Labs Review Labs Reviewed  URINALYSIS, ROUTINE W REFLEX MICROSCOPIC - Abnormal; Notable for the following:    Color, Urine AMBER (*)    APPearance CLOUDY (*)    Hgb urine dipstick LARGE (*)    Bilirubin Urine SMALL (*)    All other components within normal limits  URINE MICROSCOPIC-ADD ON    Imaging Review Dg Chest 2 View  05/13/2014   CLINICAL DATA:  Back pain.  EXAM: CHEST  2 VIEW  COMPARISON:  10/26/2013 radiographs.  FINDINGS: The heart size and mediastinal contours are stable. There is stable eventration of the right hemidiaphragm with improved linear right basilar atelectasis. The lungs are now nearly clear. There is no confluent airspace opacity or pleural effusion. The osseous structures appear unchanged.  IMPRESSION: Interval improved right basilar aeration. No active cardiopulmonary process.   Electronically Signed   By: Camie Patience M.D.   On: 05/13/2014 09:20     EKG Interpretation None      MDM   Final diagnoses:  Muscle strain   Patient presents with left upper back discomfort worsened with movement unable to recreate on physical exam with palpation. Likely musculoskeletal plan to treat with anti-inflammatories, narcotic pain medication. UA shows hemoglobin likely secondary to menses. Discharge patient with followup with PCP. Dr. Sabra Heck also evaluated the patient during this exam. Discussed lab results, imaging results, and treatment plan with the patient. Return precautions given. Reports understanding and no other concerns at this time.  Patient is stable for discharge at this time. Meds given in ED:  Medications - No data to display  Discharge Medication List as of 05/13/2014 10:42 AM    START taking these medications   Details  ibuprofen (ADVIL,MOTRIN) 800 MG tablet Take 1 tablet (800 mg total) by mouth 3 (three) times daily with meals., Starting 05/13/2014, Until  Discontinued, Print    oxyCODONE-acetaminophen (PERCOCET) 5-325 MG per tablet Take 1 tablet by mouth every 6 (six) hours as needed., Starting 05/13/2014, Until Discontinued, Print           Harvie Heck, PA-C 05/13/14 1705

## 2014-05-13 NOTE — ED Provider Notes (Signed)
33 year old female, history of HIV, presents with complaint of back pain located in the upper to mid back as well as the lower back. She states it's worse with bending over, twisting side to side and landed on her back. She denies associated leg swelling, cough, fever, shortness of breath or chest pain. On exam the patient has no tenderness to palpation over her spine or paraspinal muscles however with induced range of motion including rotational forces at the hips and back there is significant reproducible pain which she states is exactly the same. She has no peripheral edema, clear her lung sounds and no evidence of pleurisy, pneumonia or pulmonary embolism either by respecter or on clinical exam. No tachycardia, no fever, no hypoxia. Urinalysis pending to evaluate for kidney infection however likely related to musculoskeletal source, pain medications anticipated discharge home.  Medical screening examination/treatment/procedure(s) were conducted as a shared visit with non-physician practitioner(s) and myself.  I personally evaluated the patient during the encounter.  Clinical Impression:   Final diagnoses:  Muscle strain         Johnna Acosta, MD 05/13/14 [phone]

## 2014-05-14 ENCOUNTER — Other Ambulatory Visit: Payer: Self-pay | Admitting: Infectious Diseases

## 2014-05-20 ENCOUNTER — Telehealth: Payer: Self-pay | Admitting: *Deleted

## 2014-05-20 ENCOUNTER — Ambulatory Visit: Payer: Medicaid Other | Admitting: Infectious Diseases

## 2014-05-20 NOTE — Telephone Encounter (Signed)
Patient called asking if she had a recent TB test.  She needs proof of one for a new job.  Patient had a quantiferon gold test 04/29/13.  She is advised to go to her PCP or health department for test, gave phone number. Landis Gandy, RN

## 2014-05-28 ENCOUNTER — Ambulatory Visit: Payer: Medicaid Other | Admitting: Infectious Diseases

## 2014-05-28 ENCOUNTER — Ambulatory Visit: Payer: Medicaid Other

## 2014-05-31 ENCOUNTER — Ambulatory Visit (INDEPENDENT_AMBULATORY_CARE_PROVIDER_SITE_OTHER): Payer: Medicaid Other | Admitting: *Deleted

## 2014-05-31 DIAGNOSIS — B20 Human immunodeficiency virus [HIV] disease: Secondary | ICD-10-CM

## 2014-06-03 LAB — TB SKIN TEST
INDURATION: 0 mm
TB SKIN TEST: NEGATIVE

## 2014-06-06 ENCOUNTER — Other Ambulatory Visit: Payer: Self-pay | Admitting: Infectious Diseases

## 2014-06-12 ENCOUNTER — Ambulatory Visit (INDEPENDENT_AMBULATORY_CARE_PROVIDER_SITE_OTHER): Payer: Medicaid Other | Admitting: Obstetrics & Gynecology

## 2014-06-12 ENCOUNTER — Encounter: Payer: Self-pay | Admitting: Obstetrics & Gynecology

## 2014-06-12 ENCOUNTER — Other Ambulatory Visit (HOSPITAL_COMMUNITY)
Admission: RE | Admit: 2014-06-12 | Discharge: 2014-06-12 | Disposition: A | Discharge status: Home / Self Care | Payer: Medicaid Other | Source: Ambulatory Visit | Attending: Obstetrics & Gynecology | Admitting: Obstetrics & Gynecology

## 2014-06-12 VITALS — BP 101/59 | HR 84 | Ht 64.0 in | Wt 186.3 lb

## 2014-06-12 DIAGNOSIS — Z124 Encounter for screening for malignant neoplasm of cervix: Secondary | ICD-10-CM | POA: Diagnosis not present

## 2014-06-12 DIAGNOSIS — Z01411 Encounter for gynecological examination (general) (routine) with abnormal findings: Secondary | ICD-10-CM | POA: Diagnosis not present

## 2014-06-12 DIAGNOSIS — Z1151 Encounter for screening for human papillomavirus (HPV): Secondary | ICD-10-CM | POA: Diagnosis present

## 2014-06-12 DIAGNOSIS — IMO0002 Reserved for concepts with insufficient information to code with codable children: Secondary | ICD-10-CM

## 2014-06-12 DIAGNOSIS — R87810 Cervical high risk human papillomavirus (HPV) DNA test positive: Secondary | ICD-10-CM | POA: Diagnosis present

## 2014-06-12 DIAGNOSIS — Z23 Encounter for immunization: Secondary | ICD-10-CM

## 2014-06-12 DIAGNOSIS — R896 Abnormal cytological findings in specimens from other organs, systems and tissues: Secondary | ICD-10-CM

## 2014-06-12 DIAGNOSIS — N898 Other specified noninflammatory disorders of vagina: Secondary | ICD-10-CM

## 2014-06-12 NOTE — Progress Notes (Signed)
   Subjective:    Patient ID: [name], female    DOB: 12-18-1980, 33 y.o.   MRN: [id number]  HPI  33 yo S AA P1 (37 yo daughter) here for a repeat pap smear. She is HIV+ and had a LGSIL pap last year and her colpo was normal.   She also complains of a vaginal discharge for the last week. She has been abstinent for about 2 years.  She also complains some breast pain.   Review of Systems     Objective:   Physical Exam        Assessment & Plan:

## 2014-06-13 LAB — WET PREP, GENITAL
Clue Cells Wet Prep HPF POC: NONE SEEN
Trich, Wet Prep: NONE SEEN

## 2014-06-13 LAB — CYTOLOGY - PAP

## 2014-06-17 ENCOUNTER — Encounter: Payer: Self-pay | Admitting: Obstetrics & Gynecology

## 2014-06-19 ENCOUNTER — Ambulatory Visit (INDEPENDENT_AMBULATORY_CARE_PROVIDER_SITE_OTHER): Payer: Medicaid Other | Admitting: Infectious Diseases

## 2014-06-19 ENCOUNTER — Encounter: Payer: Self-pay | Admitting: Infectious Diseases

## 2014-06-19 VITALS — BP 121/84 | HR 96 | Temp 98.4°F | Wt 186.0 lb

## 2014-06-19 DIAGNOSIS — E119 Type 2 diabetes mellitus without complications: Secondary | ICD-10-CM

## 2014-06-19 DIAGNOSIS — Z113 Encounter for screening for infections with a predominantly sexual mode of transmission: Secondary | ICD-10-CM

## 2014-06-19 DIAGNOSIS — N760 Acute vaginitis: Secondary | ICD-10-CM

## 2014-06-19 DIAGNOSIS — Z79899 Other long term (current) drug therapy: Secondary | ICD-10-CM

## 2014-06-19 DIAGNOSIS — Z23 Encounter for immunization: Secondary | ICD-10-CM

## 2014-06-19 DIAGNOSIS — B20 Human immunodeficiency virus [HIV] disease: Secondary | ICD-10-CM

## 2014-06-19 DIAGNOSIS — J309 Allergic rhinitis, unspecified: Secondary | ICD-10-CM

## 2014-06-19 MED ORDER — FLUCONAZOLE 100 MG PO TABS: 100.0000 mg | ORAL_TABLET | Freq: Every day | ORAL | Status: DC

## 2014-06-19 NOTE — Assessment & Plan Note (Signed)
Suspect her ear pain and itching are due to allergies. She is taking claritin. Advised her to try flonase bid for 1 week only.

## 2014-06-19 NOTE — Assessment & Plan Note (Signed)
Will give her course of diflucan.

## 2014-06-19 NOTE — Assessment & Plan Note (Signed)
Previously took [place].  Is doing well on her current ART. She needs to restart hep B series.  Given latex free condoms.  Has gotten flu shot. pnvx up to date.  rtc 6 months.

## 2014-06-19 NOTE — Addendum Note (Signed)
Addended by: [name] A on: 06/19/2014 04:17 PM   Modules accepted: Orders

## 2014-06-19 NOTE — Assessment & Plan Note (Signed)
Has not been checking FSG. Has f/u with Dr Criss Rosales who i greatly appreciate partnering with.

## 2014-06-19 NOTE — Progress Notes (Signed)
   Subjective:    Patient ID: [name], female    DOB: [birth year], 33 y.o.   MRN: [id number]  HPI 33 yo F with hx of HIV+ on DRVr/TRV/DTGV. 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 Plan for repeat MRI 06-2014.  She was also noted to have a LLL lesion that was felt to be a lung abscess by pulmonary. She was given 10 days of augmentin for this.  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  Has been working in [employer]. No problems with meds.  Has been having problems with her ears: " wakes up and they hurt really bad and they itch really bad". Has had vaginal itching, burning.  Had Hep B S Ab- 10-2013.    HIV 1 RNA QUANT (copies/mL)  Date Value  04/23/2014 <20  10/17/2013 26*  07/31/2013 <20   CD4 T CELL ABS (/uL)  Date Value  04/23/2014 1200  10/17/2013 890  07/31/2013 940    Review of Systems  Constitutional: Negative for appetite change and unexpected weight change.  Gastrointestinal: Negative for diarrhea and constipation.  Genitourinary: Positive for vaginal pain. Negative for difficulty urinating.       Objective:   Physical Exam  Constitutional: She appears well-developed and well-nourished.  HENT:  Right Ear: Tympanic membrane is retracted. Tympanic membrane is not erythematous. A middle ear effusion is present.  Left Ear: Tympanic membrane is retracted. Tympanic membrane is not erythematous. A middle ear effusion is present.  Mouth/Throat: No oropharyngeal exudate.  Eyes: EOM are normal. Pupils are equal, round, and reactive to light.  Neck: Neck supple.  Cardiovascular: Normal rate, regular rhythm and normal heart sounds.   Pulmonary/Chest: Effort normal and breath sounds normal.  Abdominal: Soft. Bowel sounds are normal. There is no tenderness.  Lymphadenopathy:    She has no cervical adenopathy.          Assessment & Plan:

## 2014-07-03 ENCOUNTER — Telehealth: Payer: Self-pay | Admitting: *Deleted

## 2014-07-03 DIAGNOSIS — N898 Other specified noninflammatory disorders of vagina: Secondary | ICD-10-CM

## 2014-07-03 MED ORDER — FLUCONAZOLE 150 MG PO TABS: 150.0000 mg | ORAL_TABLET | Freq: Once | ORAL | Status: DC

## 2014-07-03 NOTE — Telephone Encounter (Signed)
-----   Message from Mallie Darting sent at 07/03/2014  2:18 PM EST ----- Appointment will be made in January  ----- Message -----    From: Rutherford Nail, RN    Sent: 07/03/2014   1:13 PM      To: [facility] Admin Pool  This patient needs a colposcopy.  Please schedule and resend to Clinical pool so we can call and inform her of the need for this procedure and the date/time.  Thanks,  [employer]

## 2014-07-03 NOTE — Telephone Encounter (Signed)
Contacted patient, informed of results and need for a colposcopy. Pt asked about white, itchy discharge, yeast infection medication ordered. Informed patient front office will call with an appointment. Pt verbalizes understanding.

## 2014-07-09 ENCOUNTER — Telehealth: Payer: Self-pay

## 2014-07-09 NOTE — Telephone Encounter (Signed)
Called patient and informed her of results and colpo appointment. Patient states she cannot make that appointment as she does not get out of work until 1330-- asks to have it rescheduled to an appointment after 230. Informed patient i would send her information to front office staff and they will call her with rescheduled appointment. Patient verbalized understanding, no questions or concerns.

## 2014-07-09 NOTE — Telephone Encounter (Signed)
-----   Message from Mallie Darting sent at 07/09/2014  2:55 PM EST ----- Appointment 01/07 @1 :15    ----- Message -----    From: Shelly Coss, RN    Sent: 07/08/2014   2:52 PM      To: [facility] Admin Pool  Can we schedule this patient for a colpo? Once she has her appt, we will call her. Thanks  ----- Message -----    From: Emily Filbert, MD    Sent: 07/02/2014   3:52 PM      To: Shelly Coss, RN  Let's do another colposcopy. Thanks  ----- Message -----    From: Shelly Coss, RN    Sent: 06/25/2014  11:40 AM      To: Emily Filbert, MD  Dr Hulan Fray please advise next steps on pap smear. Thanks!

## 2014-07-15 ENCOUNTER — Other Ambulatory Visit: Payer: Self-pay | Admitting: Infectious Diseases

## 2014-07-22 ENCOUNTER — Ambulatory Visit: Payer: Medicaid Other

## 2014-07-25 ENCOUNTER — Other Ambulatory Visit (INDEPENDENT_AMBULATORY_CARE_PROVIDER_SITE_OTHER): Payer: Self-pay | Admitting: General Surgery

## 2014-07-25 DIAGNOSIS — R160 Hepatomegaly, not elsewhere classified: Secondary | ICD-10-CM

## 2014-08-02 ENCOUNTER — Inpatient Hospital Stay: Admission: RE | Admit: 2014-08-02 | Payer: Medicaid Other | Source: Ambulatory Visit

## 2014-08-05 ENCOUNTER — Ambulatory Visit (INDEPENDENT_AMBULATORY_CARE_PROVIDER_SITE_OTHER): Payer: Medicaid Other | Admitting: Infectious Diseases

## 2014-08-05 ENCOUNTER — Encounter: Payer: Self-pay | Admitting: Infectious Diseases

## 2014-08-05 VITALS — BP 134/76 | HR 72 | Temp 98.3°F | Wt 185.0 lb

## 2014-08-05 DIAGNOSIS — J209 Acute bronchitis, unspecified: Secondary | ICD-10-CM

## 2014-08-05 DIAGNOSIS — Z23 Encounter for immunization: Secondary | ICD-10-CM

## 2014-08-05 DIAGNOSIS — K469 Unspecified abdominal hernia without obstruction or gangrene: Secondary | ICD-10-CM | POA: Insufficient documentation

## 2014-08-05 DIAGNOSIS — IMO0002 Reserved for concepts with insufficient information to code with codable children: Secondary | ICD-10-CM

## 2014-08-05 DIAGNOSIS — R896 Abnormal cytological findings in specimens from other organs, systems and tissues: Secondary | ICD-10-CM

## 2014-08-05 DIAGNOSIS — B20 Human immunodeficiency virus [HIV] disease: Secondary | ICD-10-CM

## 2014-08-05 DIAGNOSIS — K439 Ventral hernia without obstruction or gangrene: Secondary | ICD-10-CM

## 2014-08-05 MED ORDER — LEVOFLOXACIN 500 MG PO TABS: 500.0000 mg | ORAL_TABLET | Freq: Every day | ORAL | Status: DC

## 2014-08-05 NOTE — Progress Notes (Signed)
   Subjective:    Patient ID: [name], female    DOB: 1981-03-12, 33 y.o.   MRN: [id number]  HPI 33 yo F with hx of HIV+ on DRVr/TRV/DTGV. 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 Plan for repeat MRI 06-2014.  She was also noted to have a LLL lesion that was felt to be a lung abscess by pulmonary. She was given 10 days of augmentin for this.  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 Has f/u Sep 02, 2014.   HIV 1 RNA QUANT (copies/mL)  Date Value  04/23/2014 <20  10/17/2013 26*  07/31/2013 <20   CD4 T CELL ABS (/uL)  Date Value  04/23/2014 1200  10/17/2013 890  07/31/2013 940   Has R LN swelling and tenderness for 3 days, cough for 1 week.  No fever. Hurt to swallow yesterday, better today.  Mustard colored sputum. occas clear.  Has had occas night sweats for years. No more recent lately.   Review of Systems  Constitutional: Negative for fever, chills, appetite change and unexpected weight change.  Respiratory: Positive for cough. Negative for shortness of breath.   Hematological: Positive for adenopathy.       Objective:   Physical Exam  Constitutional: She appears well-developed and well-nourished.  HENT:  Mouth/Throat: No oropharyngeal exudate.  Eyes: EOM are normal. Pupils are equal, round, and reactive to light.  Neck: Neck supple.  Cardiovascular: Normal rate and normal heart sounds.   Pulmonary/Chest: Effort normal and breath sounds normal.  Abdominal: Soft. Bowel sounds are normal. She exhibits no distension. There is no tenderness.  Lymphadenopathy:    She has cervical adenopathy.       Right cervical: Superficial cervical adenopathy present.  Tender LN on R upper, anterior cervical chain.  There are no accompanying dental abn.           Assessment & Plan:

## 2014-08-05 NOTE — Assessment & Plan Note (Signed)
Doing well on her current rx.  Will get 3rd hep B, 2nd series at f/u.  Will see her in 5 months, labs prior.

## 2014-08-05 NOTE — Assessment & Plan Note (Signed)
Will give her 1 week of levaquin. She will call if she is not improved. i have especially asked her to call if her LN is not resolved.

## 2014-08-05 NOTE — Assessment & Plan Note (Signed)
Has GYN f/u 08-2014

## 2014-08-05 NOTE — Addendum Note (Signed)
Addended by: HATCHER, JEFFREY C on: 08/05/2014 10:04 AM   Modules accepted: Orders

## 2014-08-05 NOTE — Assessment & Plan Note (Signed)
Has f/u with Dr Odie Sera. prob repeat surgery next month.

## 2014-08-22 ENCOUNTER — Encounter: Payer: Medicaid Other | Admitting: Family Medicine

## 2014-08-29 ENCOUNTER — Ambulatory Visit
Admission: RE | Admit: 2014-08-29 | Discharge: 2014-08-29 | Disposition: A | Discharge status: Home / Self Care | Payer: Medicaid Other | Source: Ambulatory Visit | Attending: General Surgery | Admitting: General Surgery

## 2014-08-29 DIAGNOSIS — R160 Hepatomegaly, not elsewhere classified: Secondary | ICD-10-CM

## 2014-08-29 MED ORDER — GADOXETATE DISODIUM 0.25 MMOL/ML IV SOLN: 8.0000 mL | Freq: Once | INTRAVENOUS | Status: AC | PRN

## 2014-08-29 MED ORDER — GADOXETATE DISODIUM 0.25 MMOL/ML IV SOLN
8.0000 mL | Freq: Once | INTRAVENOUS | Status: AC | PRN
  Administered 2014-08-29: 8 mL via INTRAVENOUS

## 2014-08-30 NOTE — Progress Notes (Signed)
Quick Note:  Let patient know that MR looks good. The tiny one we could not reach is smaller. ______

## 2014-09-02 ENCOUNTER — Encounter: Payer: Medicaid Other | Admitting: Nurse Practitioner

## 2014-09-02 ENCOUNTER — Telehealth: Payer: Self-pay

## 2014-09-02 NOTE — Telephone Encounter (Signed)
Patient missed colpo appointment today. Called patietn who stated she completely forgot. Informed her it will be rescheduled and she will be called by our front office staff with new appointment. Advised that she call clinic if she hasnt heard from [place] by the end of the week. Patient verbalized understanding and gratitude and requested that appointment be scheduled for anytime after 3pm as she works at a school closeby. Informed patient I would make note of that for the schedulers. Patient verbalized understanding and gratitude. No further questions or concerns. Message sent to admin pool to schedule patient for after 3PM.

## 2014-09-03 ENCOUNTER — Other Ambulatory Visit (INDEPENDENT_AMBULATORY_CARE_PROVIDER_SITE_OTHER): Payer: Self-pay | Admitting: General Surgery

## 2014-09-06 ENCOUNTER — Other Ambulatory Visit: Payer: Self-pay | Admitting: Infectious Diseases

## 2014-09-30 ENCOUNTER — Encounter: Payer: Medicaid Other | Admitting: Nurse Practitioner

## 2014-10-03 ENCOUNTER — Other Ambulatory Visit: Payer: Self-pay | Admitting: *Deleted

## 2014-10-03 ENCOUNTER — Other Ambulatory Visit: Payer: Self-pay | Admitting: Infectious Diseases

## 2014-10-03 DIAGNOSIS — A6009 Herpesviral infection of other urogenital tract: Secondary | ICD-10-CM

## 2014-10-03 DIAGNOSIS — B20 Human immunodeficiency virus [HIV] disease: Secondary | ICD-10-CM

## 2014-10-03 MED ORDER — EMTRICITABINE-TENOFOVIR DF 200-300 MG PO TABS: 1.0000 | ORAL_TABLET | Freq: Every day | ORAL | Status: DC

## 2014-10-03 MED ORDER — VALACYCLOVIR HCL 500 MG PO TABS: ORAL_TABLET | ORAL | Status: DC

## 2014-10-03 MED ORDER — DOLUTEGRAVIR SODIUM 50 MG PO TABS: 50.0000 mg | ORAL_TABLET | Freq: Every day | ORAL | Status: DC

## 2014-10-03 MED ORDER — DARUNAVIR ETHANOLATE 800 MG PO TABS: 800.0000 mg | ORAL_TABLET | Freq: Every day | ORAL | Status: DC

## 2014-10-03 MED ORDER — RITONAVIR 100 MG PO TABS: ORAL_TABLET | ORAL | Status: DC

## 2014-10-22 ENCOUNTER — Other Ambulatory Visit: Payer: Self-pay | Admitting: Internal Medicine

## 2014-10-29 NOTE — Patient Instructions (Addendum)
EMALIA WITKOP  10/29/2014   Your procedure is scheduled on:  11/06/2014    Report to [facility] Main  Entrance and follow signs to               [place] at      0700 AM.  Call this number if you have problems the morning of surgery [phone]   Remember:  Do not eat food or drink liquids :After Midnight.     Take these medicines the morning of surgery with A SIP OF WATER:  Albuterol Inhaler if needed and bring Qvar Inhaler and bring, Zyrtec, Prilosec                                You may not have any metal on your body including hair pins and              piercings  Do not wear jewelry, make-up, lotions, powders or perfumes., deodorant.               Do not wear nail polish.  Do not shave  48 hours prior to surgery.                Do not bring valuables to the hospital. [place].  Contacts, dentures or bridgework may not be worn into surgery.  Leave suitcase in the car. After surgery it may be brought to your room.        Special Instructions: coughing and deep breathing exercises, leg exercises               Please read over the following fact sheets you were given: _____________________________________________________________________             [facility] - Preparing for Surgery Before surgery, you can play an important role.  Because skin is not sterile, your skin needs to be as free of germs as possible.  You can reduce the number of germs on your skin by washing with CHG (chlorahexidine gluconate) soap before surgery.  CHG is an antiseptic cleaner which kills germs and bonds with the skin to continue killing germs even after washing. Please DO NOT use if you have an allergy to CHG or antibacterial soaps.  If your skin becomes reddened/irritated stop using the CHG and inform your nurse when you arrive at Short Stay. Do not shave (including legs and underarms) for at least 48 hours  prior to the first CHG shower.  You may shave your face/neck. Please follow these instructions carefully:  1.  Shower with CHG Soap the night before surgery and the  morning of Surgery.  2.  If you choose to wash your hair, wash your hair first as usual with your  normal  shampoo.  3.  After you shampoo, rinse your hair and body thoroughly to remove the  shampoo.                           4.  Use CHG as you would any other liquid soap.  You can apply chg directly  to the skin and wash  Gently with a scrungie or clean washcloth.  5.  Apply the CHG Soap to your body ONLY FROM THE NECK DOWN.   Do not use on face/ open                           Wound or open sores. Avoid contact with eyes, ears mouth and genitals (private parts).                       Wash face,  Genitals (private parts) with your normal soap.             6.  Wash thoroughly, paying special attention to the area where your surgery  will be performed.  7.  Thoroughly rinse your body with warm water from the neck down.  8.  DO NOT shower/wash with your normal soap after using and rinsing off  the CHG Soap.                9.  Pat yourself dry with a clean towel.            10.  Wear clean pajamas.            11.  Place clean sheets on your bed the night of your first shower and do not  sleep with pets. Day of Surgery : Do not apply any lotions/deodorants the morning of surgery.  Please wear clean clothes to the hospital/surgery center.  FAILURE TO FOLLOW THESE INSTRUCTIONS MAY RESULT IN THE CANCELLATION OF YOUR SURGERY PATIENT SIGNATURE_________________________________  NURSE SIGNATURE__________________________________  ________________________________________________________________________

## 2014-10-31 ENCOUNTER — Encounter (HOSPITAL_COMMUNITY)
Admission: RE | Admit: 2014-10-31 | Discharge: 2014-10-31 | Disposition: A | Discharge status: Home / Self Care | Payer: Medicaid Other | Source: Ambulatory Visit | Attending: General Surgery | Admitting: General Surgery

## 2014-10-31 ENCOUNTER — Encounter (HOSPITAL_COMMUNITY): Payer: Self-pay

## 2014-10-31 DIAGNOSIS — Z01812 Encounter for preprocedural laboratory examination: Secondary | ICD-10-CM | POA: Insufficient documentation

## 2014-10-31 DIAGNOSIS — Z01810 Encounter for preprocedural cardiovascular examination: Secondary | ICD-10-CM | POA: Insufficient documentation

## 2014-10-31 LAB — CBC
HEMATOCRIT: 39 % (ref 36.0–46.0)
HEMOGLOBIN: 12.9 g/dL (ref 12.0–15.0)
MCH: 31.3 pg (ref 26.0–34.0)
MCHC: 33.1 g/dL (ref 30.0–36.0)
MCV: 94.7 fL (ref 78.0–100.0)
Platelets: 311 10*3/uL (ref 150–400)
RBC: 4.12 MIL/uL (ref 3.87–5.11)
RDW: 13.3 % (ref 11.5–15.5)
WBC: 7 10*3/uL (ref 4.0–10.5)

## 2014-10-31 LAB — COMPREHENSIVE METABOLIC PANEL
ALT: 18 U/L (ref 0–35)
AST: 17 U/L (ref 0–37)
Albumin: 3.6 g/dL (ref 3.5–5.2)
Alkaline Phosphatase: 83 U/L (ref 39–117)
Anion gap: 7 (ref 5–15)
BUN: 16 mg/dL (ref 6–23)
CHLORIDE: 108 mmol/L (ref 96–112)
CO2: 23 mmol/L (ref 19–32)
Calcium: 8.8 mg/dL (ref 8.4–10.5)
Creatinine, Ser: 1.05 mg/dL (ref 0.50–1.10)
GFR, EST AFRICAN AMERICAN: 79 mL/min — AB (ref >90)
GFR, EST NON AFRICAN AMERICAN: 68 mL/min — AB (ref >90)
Glucose, Bld: 88 mg/dL (ref 70–99)
Potassium: 4.2 mmol/L (ref 3.5–5.1)
Sodium: 138 mmol/L (ref 135–145)
TOTAL PROTEIN: 6.7 g/dL (ref 6.0–8.3)
Total Bilirubin: 0.4 mg/dL (ref 0.3–1.2)

## 2014-10-31 LAB — HCG, SERUM, QUALITATIVE: PREG SERUM: NEGATIVE

## 2014-11-01 NOTE — Progress Notes (Addendum)
10/31/2014 Final EKG in EPIC.   2V CXR 05/13/2014 in EPIC.

## 2014-11-06 ENCOUNTER — Encounter: Payer: Medicaid Other | Admitting: Obstetrics & Gynecology

## 2014-11-06 ENCOUNTER — Ambulatory Visit (HOSPITAL_COMMUNITY): Payer: Medicaid Other | Admitting: Anesthesiology

## 2014-11-06 ENCOUNTER — Ambulatory Visit (HOSPITAL_COMMUNITY)
Admission: RE | Admit: 2014-11-06 | Discharge: 2014-11-06 | Disposition: A | Discharge status: Home / Self Care | Payer: Medicaid Other | Source: Ambulatory Visit | Attending: General Surgery | Admitting: General Surgery

## 2014-11-06 ENCOUNTER — Encounter (HOSPITAL_COMMUNITY): Payer: Self-pay | Admitting: *Deleted

## 2014-11-06 ENCOUNTER — Encounter (HOSPITAL_COMMUNITY)
Admission: RE | Disposition: A | Discharge status: Home / Self Care | Payer: Self-pay | Source: Ambulatory Visit | Attending: General Surgery

## 2014-11-06 DIAGNOSIS — F419 Anxiety disorder, unspecified: Secondary | ICD-10-CM | POA: Diagnosis not present

## 2014-11-06 DIAGNOSIS — K439 Ventral hernia without obstruction or gangrene: Secondary | ICD-10-CM | POA: Insufficient documentation

## 2014-11-06 DIAGNOSIS — Z79899 Other long term (current) drug therapy: Secondary | ICD-10-CM | POA: Diagnosis not present

## 2014-11-06 DIAGNOSIS — F329 Major depressive disorder, single episode, unspecified: Secondary | ICD-10-CM | POA: Diagnosis not present

## 2014-11-06 DIAGNOSIS — J45909 Unspecified asthma, uncomplicated: Secondary | ICD-10-CM | POA: Insufficient documentation

## 2014-11-06 DIAGNOSIS — Z21 Asymptomatic human immunodeficiency virus [HIV] infection status: Secondary | ICD-10-CM | POA: Insufficient documentation

## 2014-11-06 DIAGNOSIS — Z888 Allergy status to other drugs, medicaments and biological substances status: Secondary | ICD-10-CM | POA: Insufficient documentation

## 2014-11-06 DIAGNOSIS — G43909 Migraine, unspecified, not intractable, without status migrainosus: Secondary | ICD-10-CM | POA: Diagnosis not present

## 2014-11-06 DIAGNOSIS — Z882 Allergy status to sulfonamides status: Secondary | ICD-10-CM | POA: Diagnosis not present

## 2014-11-06 DIAGNOSIS — E119 Type 2 diabetes mellitus without complications: Secondary | ICD-10-CM | POA: Insufficient documentation

## 2014-11-06 DIAGNOSIS — E780 Pure hypercholesterolemia: Secondary | ICD-10-CM | POA: Diagnosis not present

## 2014-11-06 DIAGNOSIS — Z91040 Latex allergy status: Secondary | ICD-10-CM | POA: Diagnosis not present

## 2014-11-06 DIAGNOSIS — K219 Gastro-esophageal reflux disease without esophagitis: Secondary | ICD-10-CM | POA: Diagnosis not present

## 2014-11-06 DIAGNOSIS — K458 Other specified abdominal hernia without obstruction or gangrene: Secondary | ICD-10-CM

## 2014-11-06 HISTORY — PX: VENTRAL HERNIA REPAIR: SHX424

## 2014-11-06 HISTORY — PX: INSERTION OF MESH: SHX5868

## 2014-11-06 LAB — GLUCOSE, CAPILLARY
GLUCOSE-CAPILLARY: 79 mg/dL (ref 70–99)
GLUCOSE-CAPILLARY: 92 mg/dL (ref 70–99)

## 2014-11-06 SURGERY — REPAIR, HERNIA, VENTRAL, LAPAROSCOPIC
Anesthesia: General | Site: Abdomen

## 2014-11-06 MED ORDER — SIMETHICONE 80 MG PO CHEW
80.0000 mg | CHEWABLE_TABLET | Freq: Four times a day (QID) | ORAL | Status: DC | PRN
Start: 2014-11-06 — End: 2014-11-06
  Administered 2014-11-06: 80 mg via ORAL
  Filled 2014-11-06 (×3): qty 1

## 2014-11-06 MED ORDER — CEFAZOLIN SODIUM-DEXTROSE 2-3 GM-% IV SOLR
2.0000 g | INTRAVENOUS | Status: AC
  Administered 2014-11-06: 2 g via INTRAVENOUS

## 2014-11-06 MED ORDER — ONDANSETRON HCL 4 MG/2ML IJ SOLN
INTRAMUSCULAR | Status: DC | PRN
  Administered 2014-11-06: 4 mg via INTRAVENOUS

## 2014-11-06 MED ORDER — EPHEDRINE SULFATE 50 MG/ML IJ SOLN
INTRAMUSCULAR | Status: AC
  Filled 2014-11-06: qty 1

## 2014-11-06 MED ORDER — FENTANYL CITRATE 0.05 MG/ML IJ SOLN
INTRAMUSCULAR | Status: DC | PRN
  Administered 2014-11-06 (×2): 50 ug via INTRAVENOUS
  Administered 2014-11-06: 100 ug via INTRAVENOUS
  Administered 2014-11-06: 50 ug via INTRAVENOUS

## 2014-11-06 MED ORDER — NEOSTIGMINE METHYLSULFATE 10 MG/10ML IV SOLN
INTRAVENOUS | Status: DC | PRN
  Administered 2014-11-06: 4 mg via INTRAVENOUS

## 2014-11-06 MED ORDER — KETOROLAC TROMETHAMINE 30 MG/ML IJ SOLN
30.0000 mg | Freq: Once | INTRAMUSCULAR | Status: AC | PRN
  Administered 2014-11-06: 30 mg via INTRAVENOUS

## 2014-11-06 MED ORDER — LIDOCAINE HCL 1 % IJ SOLN
INTRAMUSCULAR | Status: AC
  Filled 2014-11-06: qty 20

## 2014-11-06 MED ORDER — PROPOFOL 10 MG/ML IV BOLUS
INTRAVENOUS | Status: DC | PRN
  Administered 2014-11-06: 200 mg via INTRAVENOUS

## 2014-11-06 MED ORDER — GLYCOPYRROLATE 0.2 MG/ML IJ SOLN
INTRAMUSCULAR | Status: AC
  Filled 2014-11-06: qty 3

## 2014-11-06 MED ORDER — SODIUM CHLORIDE 0.9 % IJ SOLN: 3.0000 mL | Freq: Two times a day (BID) | INTRAMUSCULAR | Status: DC

## 2014-11-06 MED ORDER — DEXAMETHASONE SODIUM PHOSPHATE 10 MG/ML IJ SOLN
INTRAMUSCULAR | Status: DC | PRN
  Administered 2014-11-06: 10 mg via INTRAVENOUS

## 2014-11-06 MED ORDER — LACTATED RINGERS IV SOLN
INTRAVENOUS | Status: DC | PRN
  Administered 2014-11-06: 08:00:00 via INTRAVENOUS
  Administered 2014-11-06: 1000 mL

## 2014-11-06 MED ORDER — OXYCODONE HCL 5 MG PO TABS: 5.0000 mg | ORAL_TABLET | ORAL | Status: DC | PRN

## 2014-11-06 MED ORDER — FENTANYL CITRATE 0.05 MG/ML IJ SOLN
INTRAMUSCULAR | Status: AC
  Filled 2014-11-06: qty 5

## 2014-11-06 MED ORDER — HYDROMORPHONE HCL 1 MG/ML IJ SOLN
INTRAMUSCULAR | Status: AC
  Filled 2014-11-06: qty 1

## 2014-11-06 MED ORDER — SODIUM CHLORIDE 0.9 % IV SOLN: 250.0000 mL | INTRAVENOUS | Status: DC | PRN

## 2014-11-06 MED ORDER — ONDANSETRON HCL 4 MG/2ML IJ SOLN
INTRAMUSCULAR | Status: AC
  Filled 2014-11-06: qty 2

## 2014-11-06 MED ORDER — BUPIVACAINE-EPINEPHRINE (PF) 0.25% -1:200000 IJ SOLN
INTRAMUSCULAR | Status: AC
  Filled 2014-11-06: qty 30

## 2014-11-06 MED ORDER — OXYCODONE HCL 5 MG PO TABS
ORAL_TABLET | ORAL | Status: AC
  Administered 2014-11-06: 5 mg
  Filled 2014-11-06: qty 2

## 2014-11-06 MED ORDER — BUPIVACAINE-EPINEPHRINE 0.25% -1:200000 IJ SOLN
INTRAMUSCULAR | Status: DC | PRN
  Administered 2014-11-06: 16.5 mL

## 2014-11-06 MED ORDER — DEXAMETHASONE SODIUM PHOSPHATE 10 MG/ML IJ SOLN
INTRAMUSCULAR | Status: AC
  Filled 2014-11-06: qty 1

## 2014-11-06 MED ORDER — ROCURONIUM BROMIDE 100 MG/10ML IV SOLN
INTRAVENOUS | Status: DC | PRN
  Administered 2014-11-06: 50 mg via INTRAVENOUS

## 2014-11-06 MED ORDER — GLYCOPYRROLATE 0.2 MG/ML IJ SOLN
INTRAMUSCULAR | Status: DC | PRN
  Administered 2014-11-06: 0.6 mg via INTRAVENOUS

## 2014-11-06 MED ORDER — HYDROMORPHONE HCL 1 MG/ML IJ SOLN
0.2500 mg | INTRAMUSCULAR | Status: DC | PRN
  Administered 2014-11-06 (×2): 0.5 mg via INTRAVENOUS

## 2014-11-06 MED ORDER — OXYCODONE-ACETAMINOPHEN 5-325 MG PO TABS: 1.0000 | ORAL_TABLET | ORAL | Status: DC | PRN

## 2014-11-06 MED ORDER — MIDAZOLAM HCL 2 MG/2ML IJ SOLN
INTRAMUSCULAR | Status: AC
  Filled 2014-11-06: qty 2

## 2014-11-06 MED ORDER — CEFAZOLIN SODIUM-DEXTROSE 2-3 GM-% IV SOLR
INTRAVENOUS | Status: AC
  Filled 2014-11-06: qty 50

## 2014-11-06 MED ORDER — PROPOFOL 10 MG/ML IV BOLUS
INTRAVENOUS | Status: AC
  Filled 2014-11-06: qty 20

## 2014-11-06 MED ORDER — 0.9 % SODIUM CHLORIDE (POUR BTL) OPTIME
TOPICAL | Status: DC | PRN
  Administered 2014-11-06: 1000 mL

## 2014-11-06 MED ORDER — LIDOCAINE HCL (CARDIAC) 20 MG/ML IV SOLN
INTRAVENOUS | Status: AC
  Filled 2014-11-06: qty 5

## 2014-11-06 MED ORDER — NEOSTIGMINE METHYLSULFATE 10 MG/10ML IV SOLN
INTRAVENOUS | Status: AC
  Filled 2014-11-06: qty 1

## 2014-11-06 MED ORDER — ACETAMINOPHEN 325 MG PO TABS
650.0000 mg | ORAL_TABLET | ORAL | Status: DC | PRN
Start: 2014-11-06 — End: 2014-11-06

## 2014-11-06 MED ORDER — HYDROMORPHONE HCL 1 MG/ML IJ SOLN
0.2500 mg | INTRAMUSCULAR | Status: DC | PRN
  Administered 2014-11-06 (×4): 0.5 mg via INTRAVENOUS

## 2014-11-06 MED ORDER — SODIUM CHLORIDE 0.9 % IJ SOLN
INTRAMUSCULAR | Status: AC
  Filled 2014-11-06: qty 10

## 2014-11-06 MED ORDER — ROCURONIUM BROMIDE 100 MG/10ML IV SOLN
INTRAVENOUS | Status: AC
  Filled 2014-11-06: qty 1

## 2014-11-06 MED ORDER — MEPERIDINE HCL 50 MG/ML IJ SOLN: 6.2500 mg | INTRAMUSCULAR | Status: DC | PRN

## 2014-11-06 MED ORDER — DIPHENHYDRAMINE HCL 25 MG PO CAPS
25.0000 mg | ORAL_CAPSULE | Freq: Once | ORAL | Status: AC
  Administered 2014-11-06: 25 mg via ORAL
  Filled 2014-11-06: qty 1

## 2014-11-06 MED ORDER — LIDOCAINE HCL (PF) 1 % IJ SOLN
INTRAMUSCULAR | Status: DC | PRN
  Administered 2014-11-06: 16.5 mL

## 2014-11-06 MED ORDER — KETOROLAC TROMETHAMINE 30 MG/ML IJ SOLN
INTRAMUSCULAR | Status: AC
  Filled 2014-11-06: qty 1

## 2014-11-06 MED ORDER — METOCLOPRAMIDE HCL 5 MG/ML IJ SOLN: 10.0000 mg | Freq: Once | INTRAMUSCULAR | Status: DC | PRN

## 2014-11-06 MED ORDER — ACETAMINOPHEN 650 MG RE SUPP
650.0000 mg | RECTAL | Status: DC | PRN
Start: 2014-11-06 — End: 2014-11-06
  Filled 2014-11-06: qty 1

## 2014-11-06 MED ORDER — LIDOCAINE HCL (CARDIAC) 20 MG/ML IV SOLN
INTRAVENOUS | Status: DC | PRN
  Administered 2014-11-06: 100 mg via INTRAVENOUS

## 2014-11-06 MED ORDER — SODIUM CHLORIDE 0.9 % IJ SOLN: 3.0000 mL | INTRAMUSCULAR | Status: DC | PRN

## 2014-11-06 MED ORDER — MIDAZOLAM HCL 5 MG/5ML IJ SOLN
INTRAMUSCULAR | Status: DC | PRN
  Administered 2014-11-06 (×2): 1 mg via INTRAVENOUS

## 2014-11-06 MED ORDER — OXYCODONE HCL 5 MG PO TABS
5.0000 mg | ORAL_TABLET | ORAL | Status: DC | PRN
  Administered 2014-11-06: 10 mg via ORAL
  Filled 2014-11-06: qty 1

## 2014-11-06 SURGICAL SUPPLY — 47 items
APL SKNCLS STERI-STRIP NONHPOA (GAUZE/BANDAGES/DRESSINGS)
BANDAGE ADH SHEER 1  50/CT (GAUZE/BANDAGES/DRESSINGS) ×8 IMPLANT
BENZOIN TINCTURE PRP APPL 2/3 (GAUZE/BANDAGES/DRESSINGS) IMPLANT
BINDER ABDOMINAL 12 ML 46-62 (SOFTGOODS) IMPLANT
CABLE HIGH FREQUENCY MONO STRZ (ELECTRODE) ×2 IMPLANT
DECANTER SPIKE VIAL GLASS SM (MISCELLANEOUS) ×2 IMPLANT
DEVICE PMI PUNCTURE CLOSURE (MISCELLANEOUS) ×2 IMPLANT
DEVICE SECURE STRAP 25 ABSORB (INSTRUMENTS) ×2 IMPLANT
DEVICE TROCAR PUNCTURE CLOSURE (ENDOMECHANICALS) ×2 IMPLANT
DISSECTOR BLUNT TIP ENDO 5MM (MISCELLANEOUS) IMPLANT
DRAPE INCISE IOBAN 66X45 STRL (DRAPES) IMPLANT
DRAPE LAPAROSCOPIC ABDOMINAL (DRAPES) ×2 IMPLANT
DRSG TEGADERM 2-3/8X2-3/4 SM (GAUZE/BANDAGES/DRESSINGS) IMPLANT
ELECT REM PT RETURN 9FT ADLT (ELECTROSURGICAL) ×2
ELECTRODE REM PT RTRN 9FT ADLT (ELECTROSURGICAL) ×1 IMPLANT
GLOVE BIO SURGEON STRL SZ 6 (GLOVE) ×2 IMPLANT
GLOVE BIOGEL PI IND STRL 6.5 (GLOVE) IMPLANT
GLOVE BIOGEL PI IND STRL 7.0 (GLOVE) ×4 IMPLANT
GLOVE BIOGEL PI INDICATOR 6.5 (GLOVE)
GLOVE BIOGEL PI INDICATOR 7.0 (GLOVE) ×4
GLOVE INDICATOR 6.5 STRL GRN (GLOVE) IMPLANT
GOWN STRL REUS W/ TWL XL LVL3 (GOWN DISPOSABLE) ×3 IMPLANT
GOWN STRL REUS W/TWL 2XL LVL3 (GOWN DISPOSABLE) ×2 IMPLANT
GOWN STRL REUS W/TWL XL LVL3 (GOWN DISPOSABLE) ×6
KIT BASIN OR (CUSTOM PROCEDURE TRAY) ×2 IMPLANT
LIQUID BAND (GAUZE/BANDAGES/DRESSINGS) ×2 IMPLANT
MARKER SKIN DUAL TIP RULER LAB (MISCELLANEOUS) ×2 IMPLANT
MESH VENTRALIGHT ST 4X6IN (Mesh General) ×2 IMPLANT
NEEDLE SPNL 22GX3.5 QUINCKE BK (NEEDLE) ×2 IMPLANT
NS IRRIG 1000ML POUR BTL (IV SOLUTION) ×2 IMPLANT
PENCIL BUTTON HOLSTER BLD 10FT (ELECTRODE) IMPLANT
SCISSORS LAP 5X35 DISP (ENDOMECHANICALS) ×2 IMPLANT
SET IRRIG TUBING LAPAROSCOPIC (IRRIGATION / IRRIGATOR) IMPLANT
SHEARS HARMONIC ACE PLUS 36CM (ENDOMECHANICALS) ×4 IMPLANT
SOAP 2 % CHG 4 OZ (WOUND CARE) IMPLANT
SOLUTION ANTI FOG 6CC (MISCELLANEOUS) ×2 IMPLANT
STRIP CLOSURE SKIN 1/2X4 (GAUZE/BANDAGES/DRESSINGS) IMPLANT
SUT MNCRL AB 4-0 PS2 18 (SUTURE) ×2 IMPLANT
SUT NOVA NAB DX-16 0-1 5-0 T12 (SUTURE) ×6 IMPLANT
SUT PROLENE 0 CT 1 CR/8 (SUTURE) IMPLANT
TOWEL OR 17X26 10 PK STRL BLUE (TOWEL DISPOSABLE) ×2 IMPLANT
TRAY FOLEY CATH 14FRSI W/METER (CATHETERS) IMPLANT
TRAY LAPAROSCOPIC (CUSTOM PROCEDURE TRAY) ×2 IMPLANT
TROCAR BLADELESS OPT 5 75 (ENDOMECHANICALS) IMPLANT
TROCAR XCEL BLUNT TIP 100MML (ENDOMECHANICALS) IMPLANT
TROCAR XCEL UNIV SLVE 11M 100M (ENDOMECHANICALS) IMPLANT
TUBING INSUFFLATION 10FT LAP (TUBING) ×2 IMPLANT

## 2014-11-06 NOTE — Discharge Instructions (Addendum)
[facility], [place] [phone]  ABDOMINAL SURGERY: POST OP INSTRUCTIONS  Always review your discharge instruction sheet given to you by the facility where your surgery was performed.  IF YOU HAVE DISABILITY OR FAMILY LEAVE FORMS, YOU MUST BRING THEM TO THE OFFICE FOR PROCESSING.  PLEASE DO NOT GIVE THEM TO YOUR DOCTOR.  1. A prescription for pain medication may be given to you upon discharge.  Take your pain medication as prescribed, if needed.  If narcotic pain medicine is not needed, then you may take acetaminophen (Tylenol) or ibuprofen (Advil) as needed. 2. Take your usually prescribed medications unless otherwise directed. 3. If you need a refill on your pain medication, please contact your pharmacy. They will contact our office to request authorization.  Prescriptions will not be filled after 5pm or on week-ends. 4. You should follow a light diet the first few days after arrival home, such as soup and crackers, pudding, etc.unless your doctor has advised otherwise. A high-fiber, low fat diet can be resumed as tolerated.   Be sure to include lots of fluids daily. Most patients will experience some swelling and bruising on the chest and neck area.  Ice packs will help.  Swelling and bruising can take several days to resolve 5. Most patients will experience some swelling and bruising in the area of the incision. Ice pack will help. Swelling and bruising can take several days to resolve..  6. It is common to experience some constipation if taking pain medication after surgery.  Increasing fluid intake and taking a stool softener will usually help or prevent this problem from occurring.  A mild laxative (Milk of Magnesia or Miralax) should be taken according to package directions if there are no bowel movements after 48 hours. 7.  You may have steri-strips (small skin tapes) in place directly over the incision.  These strips should be left on the skin for 10-14 days.  If your  surgeon used skin glue on the incision, you may shower in 48 hours.  The glue will flake off over the next 2-3 weeks.  Any sutures or staples will be removed at the office during your follow-up visit. You may find that a light gauze bandage over your incision may keep your staples from being rubbed or pulled. You may shower and replace the bandage daily. 8. ACTIVITIES:  You may resume regular (light) daily activities beginning the next day--such as daily self-care, walking, climbing stairs--gradually increasing activities as tolerated.  You may have sexual intercourse when it is comfortable.  Refrain from any heavy lifting or straining until approved by your doctor. a. You may drive when you no longer are taking prescription pain medication, you can comfortably wear a seatbelt, and you can safely maneuver your car and apply brakes b. Return to Work: __________2-9 weeks if applicable_________________________ 9. You should see your doctor in the office for a follow-up appointment approximately two weeks after your surgery.  Make sure that you call for this appointment within a day or two after you arrive home to insure a convenient appointment time. OTHER INSTRUCTIONS:  _____________________________________________________________ _____________________________________________________________  WHEN TO CALL YOUR DOCTOR: 1. Fever over 101.0 2. Inability to urinate 3. Nausea and/or vomiting 4. Extreme swelling or bruising 5. Continued bleeding from incision. 6. Increased pain, redness, or drainage from the incision. 7. Difficulty swallowing or breathing 8. Muscle cramping or spasms. 9. Numbness or tingling in hands or feet or around lips.  The clinic staff is  available to answer your questions during regular business hours.  Please dont hesitate to call and ask to speak to one of the nurses if you have concerns.  For further questions, please visit [email or web address]   Wear abdominal  binder!

## 2014-11-06 NOTE — Anesthesia Preprocedure Evaluation (Addendum)
Anesthesia Evaluation  Patient identified by MRN, date of birth, ID band Patient awake    Reviewed: Allergy & Precautions, NPO status , Patient's Chart, lab work & pertinent test results  Airway Mallampati: III  TM Distance: >3 FB Neck ROM: Full    Dental no notable dental hx. (+) Teeth Intact   Pulmonary asthma ,  breath sounds clear to auscultation  Pulmonary exam normal       Cardiovascular negative cardio ROS  Rhythm:Regular Rate:Normal     Neuro/Psych  Headaches, PSYCHIATRIC DISORDERS Anxiety Depression Bipolar Disorder  Neuromuscular disease    GI/Hepatic Neg liver ROS, GERD-  Controlled and Medicated,  Endo/Other  diabetes, Well Controlled, Type 2, Oral Hypoglycemic AgentsObesity  Renal/GU negative Renal ROS  negative genitourinary   Musculoskeletal  (+) Arthritis -, Ventral hernia   Abdominal (+) + obese,   Peds  Hematology  (+) anemia , HIV infection   Anesthesia Other Findings   Reproductive/Obstetrics negative OB ROS HSV                            Anesthesia Physical Anesthesia Plan  ASA: III  Anesthesia Plan: General   Post-op Pain Management:    Induction: Intravenous  Airway Management Planned: Oral ETT  Additional Equipment:   Intra-op Plan:   Post-operative Plan: Extubation in OR  Informed Consent: I have reviewed the patients History and Physical, chart, labs and discussed the procedure including the risks, benefits and alternatives for the proposed anesthesia with the patient or authorized representative who has indicated his/her understanding and acceptance.   Dental advisory given  Plan Discussed with: CRNA, Anesthesiologist and Surgeon  Anesthesia Plan Comments:         Anesthesia Quick Evaluation

## 2014-11-06 NOTE — Anesthesia Postprocedure Evaluation (Signed)
  Anesthesia Post-op Note  Patient: [name]  Procedure(s) Performed: Procedure(s) (LRB): LAPAROSCOPIC ASSITED INCARCERATED VENTRAL HERNIA REPAIR WITH MESH (N/A) INSERTION OF MESH (N/A)  Patient Location: PACU  Anesthesia Type: General  Level of Consciousness: awake and alert   Airway and Oxygen Therapy: Patient Spontanous Breathing  Post-op Pain: mild  Post-op Assessment: Post-op Vital signs reviewed, Patient's Cardiovascular Status Stable, Respiratory Function Stable, Patent Airway and No signs of Nausea or vomiting  Last Vitals:  Filed Vitals:   11/06/14 1030  BP: 138/65  Pulse: 67  Temp:   Resp: 10    Post-op Vital Signs: stable   Complications: No apparent anesthesia complications

## 2014-11-06 NOTE — Progress Notes (Signed)
Discussed pain management with anesthesiologist Dr. Royce Macadamia; will continue with dilaudid

## 2014-11-06 NOTE — Op Note (Signed)
Laparoscopic Assisted Repair of Incarcerated Ventral Hernia with mesh.    Indications: Symptomatic ventral hernia   Pre-operative Diagnosis:  Ventral hernia   Post-operative Diagnosis: Incarcerated Ventral hernia   Surgeon: Stark Klein   Assistants: Autumn Messing  Anesthesia: General endotracheal anesthesia and Local anesthesia 1% buffered lidocaine, 0.25.% bupivacaine, with epinephrine   ASA Class: 3  Procedure Details   The patient was seen in the Holding Room. The risks, benefits, complications, treatment options, and expected outcomes were discussed with the patient. The possibilities of reaction to medication, pulmonary aspiration, perforation of viscus, bleeding, recurrent infection, the need for additional procedures, failure to diagnose a condition, and creating a complication requiring transfusion or operation were discussed with the patient. The patient concurred with the proposed plan, giving informed consent. The site of surgery properly noted/marked. The patient was taken to the operating room, identified, and the procedure verified as ventral hernia repair. A Time Out was held and the above information confirmed.   The patient was placed supine. After establishing general anesthesia, the abdomen was prepped with Chloraprep and draped in sterile fashion. The patient was placed in to reverse trendelenberg position and rotated to the right. The abdomen was accessed with a 5 mm Optiview port without complication. Pneumoperitoneum was achieved to a pressure of 15 mm of mercury. 2 additional ports are placed on the left side of the abdomen after administration of local anesthetic.   The ventral hernia was seen and there was omentum incarcerated in the hernia sac.  The previous lower midline incision was closed without evidence of hernia. This was stuck and was taken down with the harmonic scalpel. The hernia defect was measured. This is approximately 5x3 cm. The skin was opened directly  over the hernia approximately 4 cm.  The hernia sac was removed.  The 4x6 inch ventralex lite mesh was selected. The mesh had 4 sutures placed at the borders of the mesh.  The mesh was inserted into the abdomen via the fascial defect.  The fascia was pulled back together and secured with interrupted #1 novofil pop sutures.    The endoclose device was used to pass the sutures through the abdominal wall.  These were tied down and the mesh was pulled out tight.  The sutures were tied down.  The mesh was tacked down circumferentially with the securestrap tacker. The mesh was examined and seen to have good lie.   The pneumoperitoneum was allowed to evacuate. This was done after making sure there was no pooling of blood in the abdomen and no evidence of bowel injury. The skin incisions were closed with 4-0 Monocryl in subcuticular fashion. The wounds were then cleaned, dried, and dressed with Dermabond. The patient was awakened from anesthesia and taken to the [facility] in stable condition. Needle sponge and instrument counts were correct x2.   Findings:  Incarcerated omentum in 3x5 cm hernia defect in epigastric region above all incisions.    Estimated Blood Loss: Minimal   Drains: none   Complications: None; patient tolerated the procedure well.   Disposition: PACU - hemodynamically stable.   Condition: stable

## 2014-11-06 NOTE — Interval H&P Note (Signed)
History and Physical Interval Note:  11/06/2014 8:31 AM  [name]  has presented today for surgery, with the diagnosis of VENTRAL HERNIA  The various methods of treatment have been discussed with the patient and family. After consideration of risks, benefits and other options for treatment, the patient has consented to  Procedure(s): [place] (N/A) INSERTION OF MESH (N/A) as a surgical intervention .  The patient's history has been reviewed, patient examined, no change in status, stable for surgery.  I have reviewed the patient's chart and labs.  Questions were answered to the patient's satisfaction.     Alberto Schoch

## 2014-11-06 NOTE — Anesthesia Procedure Notes (Signed)
Procedure Name: Intubation Date/Time: 11/06/2014 8:47 AM Performed by: Glory Buff Pre-anesthesia Checklist: Patient identified, Emergency Drugs available, Suction available and Patient being monitored Patient Re-evaluated:Patient Re-evaluated prior to inductionOxygen Delivery Method: Circle System Utilized Preoxygenation: Pre-oxygenation with 100% oxygen Intubation Type: IV induction Ventilation: Mask ventilation without difficulty Laryngoscope Size: Miller and 3 Grade View: Grade I Tube type: Oral Tube size: 7.5 mm Number of attempts: 1 Airway Equipment and Method: Stylet and Oral airway Placement Confirmation: ETT inserted through vocal cords under direct vision,  positive ETCO2 and breath sounds checked- equal and bilateral Secured at: 21 cm Tube secured with: Tape Dental Injury: Teeth and Oropharynx as per pre-operative assessment

## 2014-11-06 NOTE — H&P (Signed)
[facility] Location: [facility] Patient #: [id number] DOB: Nov 27, 1980 Single / Language: Cleophus Molt / Race: Black or African American Female  History of Present Illness Patient words: recheck liver and possible hernia.  The patient is a 34 year old female who presents with a liver mass. Patient is a 34 year old female who presents to follow-up a CT scan that she underwent. 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 On a CT scan performed for evaluation of a lung lesion, she was seen to have some liver lesions of the liver diaphragm. 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 This was consistent with spread while in Trendelenburg position. Her most recent MRI demonstrated resolution of the 2 dominant masses. There was one small residual mass in segment 6. This was consistent with the one that I could not reach. Of note, she has been having some pain just above her umbilicus. She has also noticed a bulge there. Her primary care physician confirmed a hernia. She does have some pain and serial images and I biopsied a significant amount of time. She designed any nausea or vomiting.   Other Problems Anxiety Disorder Arthritis Asthma Back Pain Chest pain Depression Diabetes Mellitus Gastroesophageal Reflux Disease HIV-positive Hypercholesterolemia Migraine Headache Oophorectomy  Past Surgical History  Cesarean Section - 1 Liver Surgery Oral Surgery Resection of Stomach  Diagnostic Studies History  Colonoscopy 1-5 years ago Mammogram never Pap Smear 1-5 years ago  Allergies Benzoin *DERMATOLOGICALS* Latex Exam Gloves *MEDICAL DEVICES AND SUPPLIES* Sulfa Antibiotics  Medication History Montelukast Sodium (10MG  Tablet, Oral daily) Active. Albuterol Sulfate HFA (108 (90 Base)MCG/ACT Aerosol Soln, Inhalation as needed) Active. Qvar (40MCG/ACT Aerosol Soln, Inhalation as needed) Active. ZyrTEC Allergy (10MG  Capsule,  Oral as needed) Active. Prezista (800MG  Tablet, Oral daily) Active. Tivicay (50MG  Tablet, Oral daily) Active. Cymbalta (30MG  Capsule DR Part, Oral daily) Active. Truvada (200-300MG  Tablet, Oral daily) Active. Nasarel (29MCG/ACT Solution, Nasal daily) Active. Ibuprofen (800MG  Tablet, Oral as needed) Active. Latuda (20MG  Tablet, Oral daily) Active. Norvir (100MG  Capsule, Oral daily) Active. PriLOSEC (40MG  Capsule DR, Oral daily) Active. Zofran ODT (4MG  Tablet Disperse, Oral as needed) Active. Valtrex (500MG  Tablet, Oral as needed) Active.  Social History  Alcohol use Occasional alcohol use. Caffeine use Coffee. No drug use Tobacco use Never smoker.  Family History  Alcohol Abuse Father, Sister. Arthritis Family Members In General, Mother, Sister. Breast Cancer Family Members In General. Cervical Cancer Sister. Depression Mother, Sister. Diabetes Mellitus Family Members In General. Heart Disease Family Members In General. Hypertension Mother. Migraine Headache Mother, Sister.  Pregnancy / Birth History  Age at menarche 19 years. Contraceptive History Depo-provera, Oral contraceptives. Gravida 3 Irregular periods Maternal age 32-20 Para 1  Review of Systems General Present- Fatigue and Weight Gain. Not Present- Appetite Loss, Chills, Fever, Night Sweats and Weight Loss. Skin Present- Dryness. Not Present- Change in Wart/Mole, Hives, Jaundice, New Lesions, Non-Healing Wounds, Rash and Ulcer. HEENT Present- Seasonal Allergies and Sinus Pain. Not Present- Earache, Hearing Loss, Hoarseness, Nose Bleed, Oral Ulcers, Ringing in the Ears, Sore Throat, Visual Disturbances, Wears glasses/contact lenses and Yellow Eyes. Respiratory Present- Wheezing. Not Present- Bloody sputum, Chronic Cough, Difficulty Breathing and Snoring. Cardiovascular Not Present- Chest Pain, Difficulty Breathing Lying Down, Leg Cramps, Palpitations, Rapid Heart Rate, Shortness of  Breath and Swelling of Extremities. Gastrointestinal Present- Abdominal Pain, Bloating, Change in Bowel Habits, Excessive gas, Hemorrhoids and Indigestion. Not Present- Bloody Stool, Chronic diarrhea, Constipation, Difficulty Swallowing, Gets full quickly at meals, Nausea, Rectal  Pain and Vomiting. Neurological Not Present- Decreased Memory, Fainting, Headaches, Numbness, Seizures, Tingling, Tremor, Trouble walking and Weakness. Psychiatric Present- Anxiety, Bipolar, Change in Sleep Pattern and Depression. Not Present- Fearful and Frequent crying. Endocrine Present- Hair Changes. Not Present- Cold Intolerance, Excessive Hunger, Heat Intolerance, Hot flashes and New Diabetes. Hematology Present- HIV. Not Present- Easy Bruising, Excessive bleeding, Gland problems and Persistent Infections.   Vitals Wt Readings from Last 3 Encounters:  08/05/14 83.915 kg (185 lb)  06/19/14 84.369 kg (186 lb)  06/12/14 84.505 kg (186 lb 4.8 oz)   Temp Readings from Last 3 Encounters:  11/06/14 97.5 F (36.4 C) Oral  08/05/14 98.3 F (36.8 C) Oral  06/19/14 98.4 F (36.9 C) Oral   BP Readings from Last 3 Encounters:  11/06/14 117/93  08/05/14 134/76  06/19/14 121/84   Pulse Readings from Last 3 Encounters:  11/06/14 67  08/05/14 72  06/19/14 96       Physical Exam  General Mental Status-Alert. General Appearance-Consistent with stated age. Hydration-Well hydrated. Voice-Normal.  Head and Neck Head-normocephalic, atraumatic with no lesions or palpable masses.  Eye Sclera/Conjunctiva - Bilateral-No scleral icterus.  Chest and Lung Exam Chest and lung exam reveals -quiet, even and easy respiratory effort with no use of accessory muscles. Inspection Chest Wall - Normal. Back - normal.  Breast - Did not examine.  Cardiovascular Cardiovascular examination reveals -normal pedal pulses bilaterally. Note: regular rate and rhythm  Abdomen Inspection Hernias - Linea alba  - Reducible. Palpation/Percussion Palpation and Percussion of the abdomen reveal - Soft, Non Tender, No Rebound tenderness, No Rigidity (guarding) and No hepatosplenomegaly. Auscultation Auscultation of the abdomen reveals - Bowel sounds normal.  Peripheral Vascular Upper Extremity Inspection - Bilateral - Normal - No Clubbing, No Cyanosis, No Edema, Pulses Intact. Lower Extremity Palpation - Edema - Bilateral - No edema.  Neurologic Neurologic evaluation reveals -alert and oriented x 3 with no impairment of recent or remote memory. Mental Status-Normal.  Musculoskeletal Global Assessment -Note: no gross deformities.  Normal Exam - Left-Upper Extremity Strength Normal and Lower Extremity Strength Normal. Normal Exam - Right-Upper Extremity Strength Normal and Lower Extremity Strength Normal.  Lymphatic Head & Neck  General Head & Neck Lymphatics: Bilateral - Description - Normal. Axillary  General Axillary Region: Bilateral - Description - Normal. Tenderness - Non Tender.    Assessment & Plan  VENTRAL HERNIA WITHOUT OBSTRUCTION OR GANGRENE (553.20  K43.9) Impression: Plan hybrid laparoscopic and open repair of ventral hernia. She would like to do this the week before starting rate. 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  We discussed the risk of surgery including bleeding, infection, damage to adjacent structures, possible abortion of mesh placement. Current Plans  Pt Education - [facility] Hernia Post-Op HCI (Gross): discussed with patient and provided information. Schedule for Surgery DERMOID (229.9  D36.9) Impression: No evidence of recurrence teratoma. There is still a very small lesion that we could not reach laparoscopically. This lesion is unchanged.    Signed by Stark Klein, MD

## 2014-11-06 NOTE — Transfer of Care (Signed)
Immediate Anesthesia Transfer of Care Note  Patient: [name]  Procedure(s) Performed: Procedure(s): LAPAROSCOPIC ASSITED INCARCERATED VENTRAL HERNIA REPAIR WITH MESH (N/A) INSERTION OF MESH (N/A)  Patient Location: PACU  Anesthesia Type:General  Level of Consciousness: awake, alert  and oriented  Airway & Oxygen Therapy: Patient Spontanous Breathing and Patient connected to face mask oxygen  Post-op Assessment: Report given to RN and Post -op Vital signs reviewed and stable  Post vital signs: Reviewed and stable  Last Vitals:  Filed Vitals:   11/06/14 0709  BP: 117/93  Pulse: 67  Temp: 36.4 C  Resp: 16    Complications: No apparent anesthesia complications

## 2014-11-07 ENCOUNTER — Encounter (HOSPITAL_COMMUNITY): Payer: Self-pay | Admitting: General Surgery

## 2014-11-08 ENCOUNTER — Telehealth: Payer: Self-pay | Admitting: Surgery

## 2014-11-08 NOTE — Telephone Encounter (Signed)
[name] had a ventral hernia repair by Dr. Barry Dienes on 11/06/2014.  She is distended and sore and has not had a BM.  She took some MOM without results.  She is not febrile.  I told her to take only liquids, to get up and move.  She has Percocet for pain and I talked to her that this can be constipating.  She will call back if she has continued trouble.  DN  11/08/2014

## 2014-11-26 ENCOUNTER — Ambulatory Visit
Admission: RE | Admit: 2014-11-26 | Discharge: 2014-11-26 | Disposition: A | Discharge status: Home / Self Care | Payer: Medicaid Other | Source: Ambulatory Visit | Attending: General Surgery | Admitting: General Surgery

## 2014-11-26 ENCOUNTER — Other Ambulatory Visit: Payer: Self-pay | Admitting: General Surgery

## 2014-11-26 ENCOUNTER — Other Ambulatory Visit: Payer: Self-pay

## 2014-11-26 DIAGNOSIS — K5900 Constipation, unspecified: Secondary | ICD-10-CM

## 2014-11-26 NOTE — Addendum Note (Signed)
Addended by: Stark Klein on: 11/26/2014 03:06 PM   Modules accepted: Orders

## 2014-11-27 ENCOUNTER — Encounter: Payer: Self-pay | Admitting: Obstetrics & Gynecology

## 2014-11-27 ENCOUNTER — Ambulatory Visit (INDEPENDENT_AMBULATORY_CARE_PROVIDER_SITE_OTHER): Payer: Medicaid Other | Admitting: Obstetrics & Gynecology

## 2014-11-27 ENCOUNTER — Encounter: Payer: Medicaid Other | Admitting: Obstetrics & Gynecology

## 2014-11-27 VITALS — BP 108/77 | HR 83 | Temp 98.7°F | Resp 20 | Ht 64.0 in | Wt 185.3 lb

## 2014-11-27 DIAGNOSIS — Z30013 Encounter for initial prescription of injectable contraceptive: Secondary | ICD-10-CM

## 2014-11-27 DIAGNOSIS — Z01812 Encounter for preprocedural laboratory examination: Secondary | ICD-10-CM

## 2014-11-27 DIAGNOSIS — Z3202 Encounter for pregnancy test, result negative: Secondary | ICD-10-CM | POA: Diagnosis not present

## 2014-11-27 DIAGNOSIS — Z30430 Encounter for insertion of intrauterine contraceptive device: Secondary | ICD-10-CM

## 2014-11-27 DIAGNOSIS — J302 Other seasonal allergic rhinitis: Secondary | ICD-10-CM

## 2014-11-27 LAB — POCT PREGNANCY, URINE: Preg Test, Ur: NEGATIVE

## 2014-11-27 MED ORDER — ETONOGESTREL 68 MG ~~LOC~~ IMPL
68.0000 mg | DRUG_IMPLANT | Freq: Once | SUBCUTANEOUS | Status: AC
  Administered 2014-11-27: 68 mg via SUBCUTANEOUS

## 2014-11-27 NOTE — Progress Notes (Signed)
Patient ID: [name], female   DOB: May 19, 1981, 34 y.o.   MRN: [id number] Pt scheduled for colposcopy today but has started her period today. She will re-schedule the Colpo but wants to start birth control. She is considering Nexplanon vs Depo Provera injections and wants to discuss with MD.

## 2014-11-27 NOTE — Progress Notes (Signed)
     GYNECOLOGY CLINIC PROCEDURE NOTE  [name] is a 34 y.o. [phone] here for Nexplanon insertion.  Had a lengthy discussion with her about risks/benefits of Depo Provera vs Nexplanon; she had been on Depo Provera for several years.  Last pap smear was on 06/12/14 and showed LGSIL with positive HRHPV.  She was supposed to get a colposcopy today, but this was rescheduled given that she just started her period today and has heavy bleeding.  No other gynecologic concerns.  Past Medical History  Diagnosis Date  . HIV infection   . Anxiety   . Depression   . Asthma   . GERD (gastroesophageal reflux disease)   . Neuromuscular disorder     carpal tunnel in both arms  . Human immunodeficiency virus (HIV) disease 12/07/2012  . Herpes genitalis in women 12/07/2012  . Bipolar disorder, unspecified 12/07/2012  . Anxiety state, unspecified 12/07/2012  . Unspecified asthma(493.90) 12/07/2012  . GERD (gastroesophageal reflux disease) 12/07/2012  . Anemia   . Arthritis   . Allergy   . Headache(784.0)     migraines  . Diabetes mellitus without complication     no meds currently- controlled with diet  . Type II or unspecified type diabetes mellitus without mention of complication, not stated as uncontrolled 12/07/2012    Nexplanon Insertion Procedure Patient identified, informed consent performed, consent signed.   Patient does understand that irregular bleeding is a very common side effect of this medication. She was advised to have backup contraception for one week after placement. Pregnancy test in clinic today was negative.  Appropriate time out taken.  Patient's left arm was prepped and draped in the usual sterile fashion.. The ruler used to measure and mark insertion area.  Patient was prepped with alcohol swab and then injected with 3 ml of 1% lidocaine.  She was prepped with betadine, Nexplanon removed from packaging,  Device confirmed in needle, then inserted full length of needle and  withdrawn per handbook instructions. Nexplanon was able to palpated in the patient's arm; patient palpated the insert herself. There was minimal blood loss.  Patient insertion site covered with guaze and a pressure bandage to reduce any bruising.  The patient tolerated the procedure well and was given post procedure instructions.   She will return for colposcopy soon.   Verita Schneiders, MD, [place] Attending [place] for [employer], [place]

## 2014-11-27 NOTE — Patient Instructions (Signed)
Return to clinic for any scheduled appointments or for any gynecologic concerns as needed.   

## 2014-11-27 NOTE — Progress Notes (Signed)
Quick Note:  Please let patient know that the xrays do show stool all the way on the right colon consistent with constipation. Take miralax. ______

## 2014-11-28 ENCOUNTER — Encounter: Payer: Self-pay | Admitting: Obstetrics & Gynecology

## 2014-11-29 ENCOUNTER — Telehealth: Payer: Self-pay

## 2014-11-29 NOTE — Telephone Encounter (Signed)
Patient called stating she had Nexplanon placed on Wednesday and that when she moves her arm a certain way she feels it poking and it is really painful.

## 2014-11-29 NOTE — Telephone Encounter (Signed)
Attempted to contact patient. No answer. Left message stating we are returning your call, please call clinic.

## 2014-12-03 NOTE — Telephone Encounter (Signed)
Called patient a second time. No answer. Left message stating we are returning your call, call clinic if still have concerns.

## 2014-12-19 ENCOUNTER — Encounter: Payer: Medicaid Other | Admitting: Obstetrics & Gynecology

## 2014-12-25 ENCOUNTER — Other Ambulatory Visit: Payer: Medicaid Other

## 2014-12-26 ENCOUNTER — Other Ambulatory Visit: Payer: Medicaid Other

## 2014-12-26 ENCOUNTER — Other Ambulatory Visit (HOSPITAL_COMMUNITY)
Admission: RE | Admit: 2014-12-26 | Discharge: 2014-12-26 | Disposition: A | Discharge status: Home / Self Care | Payer: Medicaid Other | Source: Ambulatory Visit | Attending: Infectious Diseases | Admitting: Infectious Diseases

## 2014-12-26 DIAGNOSIS — Z79899 Other long term (current) drug therapy: Secondary | ICD-10-CM

## 2014-12-26 DIAGNOSIS — Z113 Encounter for screening for infections with a predominantly sexual mode of transmission: Secondary | ICD-10-CM

## 2014-12-26 DIAGNOSIS — B20 Human immunodeficiency virus [HIV] disease: Secondary | ICD-10-CM

## 2014-12-26 LAB — LIPID PANEL
CHOL/HDL RATIO: 6.4 ratio
CHOLESTEROL: 198 mg/dL (ref 0–200)
HDL: 31 mg/dL — ABNORMAL LOW (ref >=46)
LDL Cholesterol: 148 mg/dL — ABNORMAL HIGH (ref 0–99)
Triglycerides: 93 mg/dL (ref <150)
VLDL: 19 mg/dL (ref 0–40)

## 2014-12-26 LAB — CBC
HEMATOCRIT: 40.7 % (ref 36.0–46.0)
Hemoglobin: 14 g/dL (ref 12.0–15.0)
MCH: 31.5 pg (ref 26.0–34.0)
MCHC: 34.4 g/dL (ref 30.0–36.0)
MCV: 91.5 fL (ref 78.0–100.0)
MPV: 10 fL (ref 8.6–12.4)
Platelets: 301 10*3/uL (ref 150–400)
RBC: 4.45 MIL/uL (ref 3.87–5.11)
RDW: 14.4 % (ref 11.5–15.5)
WBC: 9.9 10*3/uL (ref 4.0–10.5)

## 2014-12-26 LAB — COMPREHENSIVE METABOLIC PANEL
ALT: 18 U/L (ref 0–35)
AST: 15 U/L (ref 0–37)
Albumin: 4.3 g/dL (ref 3.5–5.2)
Alkaline Phosphatase: 97 U/L (ref 39–117)
BILIRUBIN TOTAL: 0.4 mg/dL (ref 0.2–1.2)
BUN: 17 mg/dL (ref 6–23)
CALCIUM: 8.9 mg/dL (ref 8.4–10.5)
CO2: 22 meq/L (ref 19–32)
CREATININE: 1.1 mg/dL (ref 0.50–1.10)
Chloride: 107 mEq/L (ref 96–112)
GLUCOSE: 82 mg/dL (ref 70–99)
Potassium: 4.1 mEq/L (ref 3.5–5.3)
Sodium: 140 mEq/L (ref 135–145)
Total Protein: 7.1 g/dL (ref 6.0–8.3)

## 2014-12-27 LAB — T-HELPER CELL (CD4) - (RCID CLINIC ONLY)
CD4 T CELL ABS: 1550 /uL (ref 400–2700)
CD4 T CELL HELPER: 36 % (ref 33–55)

## 2014-12-27 LAB — RPR

## 2014-12-28 LAB — HIV-1 RNA QUANT-NO REFLEX-BLD
HIV 1 RNA QUANT: 21 {copies}/mL (ref <20)
HIV-1 RNA QUANT, LOG: 1.32 {Log} — AB (ref <1.30)

## 2014-12-30 LAB — URINE CYTOLOGY ANCILLARY ONLY
Chlamydia: NEGATIVE
Neisseria Gonorrhea: NEGATIVE

## 2015-01-01 ENCOUNTER — Encounter: Payer: Self-pay | Admitting: Infectious Diseases

## 2015-01-02 ENCOUNTER — Other Ambulatory Visit: Payer: Self-pay | Admitting: Infectious Disease

## 2015-01-08 ENCOUNTER — Other Ambulatory Visit: Payer: Self-pay | Admitting: Pharmacist Clinician (PhC)/ Clinical Pharmacy Specialist

## 2015-01-08 MED ORDER — MOMETASONE FUROATE 50 MCG/ACT NA SUSP: 2.0000 | Freq: Every day | NASAL | Status: DC

## 2015-01-08 NOTE — Progress Notes (Signed)
Medicaid rejected her flunisolide for allergies. Beclomethasone and flunisolide are preferred in PIs and cobicistat regimen but we'll use mometasone as the third option.

## 2015-01-15 ENCOUNTER — Ambulatory Visit (INDEPENDENT_AMBULATORY_CARE_PROVIDER_SITE_OTHER): Payer: Medicaid Other | Admitting: Infectious Diseases

## 2015-01-15 ENCOUNTER — Ambulatory Visit
Admission: RE | Admit: 2015-01-15 | Discharge: 2015-01-15 | Disposition: A | Discharge status: Home / Self Care | Payer: Medicaid Other | Source: Ambulatory Visit | Attending: Infectious Diseases | Admitting: Infectious Diseases

## 2015-01-15 ENCOUNTER — Encounter: Payer: Self-pay | Admitting: Infectious Diseases

## 2015-01-15 VITALS — BP 116/80 | HR 73 | Temp 98.3°F | Ht 64.0 in | Wt 184.0 lb

## 2015-01-15 DIAGNOSIS — R87612 Low grade squamous intraepithelial lesion on cytologic smear of cervix (LGSIL): Secondary | ICD-10-CM

## 2015-01-15 DIAGNOSIS — R896 Abnormal cytological findings in specimens from other organs, systems and tissues: Secondary | ICD-10-CM | POA: Diagnosis not present

## 2015-01-15 DIAGNOSIS — B20 Human immunodeficiency virus [HIV] disease: Secondary | ICD-10-CM | POA: Diagnosis not present

## 2015-01-15 DIAGNOSIS — Z23 Encounter for immunization: Secondary | ICD-10-CM

## 2015-01-15 DIAGNOSIS — J852 Abscess of lung without pneumonia: Secondary | ICD-10-CM

## 2015-01-15 DIAGNOSIS — IMO0002 Reserved for concepts with insufficient information to code with codable children: Secondary | ICD-10-CM

## 2015-01-15 DIAGNOSIS — R160 Hepatomegaly, not elsewhere classified: Secondary | ICD-10-CM | POA: Diagnosis present

## 2015-01-15 NOTE — Assessment & Plan Note (Signed)
She is doing very well.  Will continue her current rx Gets Hep B #3 today.  See her back in 6 months with labs prior.

## 2015-01-15 NOTE — Assessment & Plan Note (Signed)
Will get repeat CXR, she is asx

## 2015-01-15 NOTE — Assessment & Plan Note (Deleted)
Reinforced her need to call [facility] hospital and make f/u appt.

## 2015-01-15 NOTE — Addendum Note (Signed)
Addended by: Landis Gandy on: 01/15/2015 04:58 PM   Modules accepted: Orders

## 2015-01-15 NOTE — Progress Notes (Signed)
   Subjective:    Patient ID: [name], female    DOB: 1980/09/13, 34 y.o.   MRN: [id number]  HPI 34 yo F with hx of HIV+ on DRVr/TRV/DTGV. 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 Plan for repeat MRI 06-2014.  She was also noted to have a LLL lesion that was felt to be a lung abscess by pulmonary. She was given 10 days of augmentin for this.  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 She had f/u appt for colpo this spring but was having menses. Needs to reschedule.  No problems with ART except she missed multiple days when she had hernia surgery.   HIV 1 RNA QUANT (copies/mL)  Date Value  12/26/2014 21  04/23/2014 <20  10/17/2013 26*   CD4 T CELL ABS (/uL)  Date Value  12/26/2014 1550  04/23/2014 1200  10/17/2013 890    Review of Systems  Constitutional: Negative for appetite change and unexpected weight change.  HENT: Positive for postnasal drip.   Respiratory: Positive for cough.   Gastrointestinal: Positive for diarrhea and constipation.  Genitourinary: Negative for difficulty urinating and menstrual problem.       Objective:   Physical Exam  Constitutional: She appears well-developed and well-nourished.  HENT:  Mouth/Throat: No oropharyngeal exudate.  Eyes: EOM are normal. Pupils are equal, round, and reactive to light.  Neck: Neck supple.  Cardiovascular: Normal rate, regular rhythm and normal heart sounds.   Pulmonary/Chest: Effort normal and breath sounds normal.  Abdominal: Soft. Bowel sounds are normal. She exhibits no distension. There is no tenderness.  Lymphadenopathy:    She has no cervical adenopathy.       Assessment & Plan:

## 2015-01-15 NOTE — Assessment & Plan Note (Signed)
Will refer her f/u to Dr Barry Dienes

## 2015-01-15 NOTE — Assessment & Plan Note (Signed)
Reinforced her need to call [facility] hospital and make f/u appt.

## 2015-02-07 ENCOUNTER — Encounter: Payer: Self-pay | Admitting: Internal Medicine

## 2015-02-28 ENCOUNTER — Other Ambulatory Visit: Payer: Self-pay | Admitting: Infectious Disease

## 2015-03-05 ENCOUNTER — Ambulatory Visit (INDEPENDENT_AMBULATORY_CARE_PROVIDER_SITE_OTHER): Payer: Medicaid Other | Admitting: Internal Medicine

## 2015-03-05 ENCOUNTER — Encounter: Payer: Self-pay | Admitting: Internal Medicine

## 2015-03-05 VITALS — BP 110/70 | HR 68 | Ht 64.0 in | Wt 182.0 lb

## 2015-03-05 DIAGNOSIS — K5900 Constipation, unspecified: Secondary | ICD-10-CM | POA: Diagnosis not present

## 2015-03-05 DIAGNOSIS — R109 Unspecified abdominal pain: Secondary | ICD-10-CM

## 2015-03-05 DIAGNOSIS — K219 Gastro-esophageal reflux disease without esophagitis: Secondary | ICD-10-CM | POA: Diagnosis not present

## 2015-03-05 MED ORDER — OMEPRAZOLE 40 MG PO CPDR: 40.0000 mg | DELAYED_RELEASE_CAPSULE | Freq: Every day | ORAL | Status: DC

## 2015-03-05 MED ORDER — LUBIPROSTONE 8 MCG PO CAPS: 8.0000 ug | ORAL_CAPSULE | Freq: Two times a day (BID) | ORAL | Status: DC

## 2015-03-05 MED ORDER — AMITRIPTYLINE HCL 50 MG PO TABS: 50.0000 mg | ORAL_TABLET | Freq: Every day | ORAL | Status: DC

## 2015-03-05 NOTE — Progress Notes (Signed)
Subjective:    Patient ID: [name], female    DOB: 1981/06/10, 34 y.o.   MRN: [id number]  HPI [name] is a 34 year old female with past medical history of HIV, ovarian teratoma status post right oophorectomy in April 8546 complicated by sigmoid colon injury, history of liver masses status post resection in November 2014 found to be implants of teratoma, GERD, bipolar disorder, diabetes, and recent laparoscopic repair of incarcerated ventral hernia with mesh placement who is seen in follow-up. She seen at the request of Dr. Barry Dienes to evaluate ongoing issues with constipation and abdominal pain.  She reports that she is having spells of constipation and having bowel movements approximately 2 days per week. This leads to abdominal bloating and pain when she is constipated. She also has soreness on her anterior abdominal wall and she reports "I think my body is rejecting the mesh". This soreness is worse after eating and with constipation. It hurts to touch and hurts to sit for prolonged periods of time. She was given Linzess 4 days ago and this has resulted in diarrhea. She also is having issues with heartburn and indigestion when not taking omeprazole. She has run out of her omeprazole which she was taking 40 mg daily. With this medicine she was not having heartburn or indigestion she denies blood in her stool or melena. She has found the abdominal pain after surgery to be somewhat depressing but she denies SI and HI   Review of Systems As per history of present illness, otherwise negative  Current Medications, Allergies, Past Medical History, Past Surgical History, Family History and Social History were reviewed in [employer] record.     Objective:   Physical Exam BP 110/70 mmHg  Pulse 68  Ht 5\' 4"  (1.626 m)  Wt 182 lb (82.555 kg)  BMI 31.22 kg/m2 Constitutional: Well-developed and well-nourished. No distress. HEENT: Normocephalic and atraumatic.  Oropharynx is clear and moist. No oropharyngeal exudate. Conjunctivae are normal.  No scleral icterus. Neck: Neck supple. Trachea midline. Cardiovascular: Normal rate, regular rhythm and intact distal pulses. No M/R/G Pulmonary/chest: Effort normal and breath sounds normal. No wheezing, rales or rhonchi. Abdominal: Soft, well-healed abdominal scars, superficial tenderness to touch anterior abdomen near umbilicus without rebound or guarding, nondistended. Bowel sounds active throughout. Marland Kitchen Extremities: no clubbing, cyanosis, or edema Lymphadenopathy: No cervical adenopathy noted. Neurological: Alert and oriented to person place and time. Skin: Skin is warm and dry. No rashes noted. Psychiatric: Normal mood and affect. Behavior is normal.  CBC    Component Value Date/Time   WBC 9.9 12/26/2014 1618   RBC 4.45 12/26/2014 1618   HGB 14.0 12/26/2014 1618   HCT 40.7 12/26/2014 1618   PLT 301 12/26/2014 1618   MCV 91.5 12/26/2014 1618   MCH 31.5 12/26/2014 1618   MCHC 34.4 12/26/2014 1618   RDW 14.4 12/26/2014 1618   LYMPHSABS 3.5 04/23/2014 1500   MONOABS 0.5 04/23/2014 1500   EOSABS 0.1 04/23/2014 1500   BASOSABS 0.1 04/23/2014 1500    CMP     Component Value Date/Time   NA 140 12/26/2014 1618   K 4.1 12/26/2014 1618   CL 107 12/26/2014 1618   CO2 22 12/26/2014 1618   GLUCOSE 82 12/26/2014 1618   BUN 17 12/26/2014 1618   CREATININE 1.10 12/26/2014 1618   CREATININE 1.05 10/31/2014 1510   CALCIUM 8.9 12/26/2014 1618   PROT 7.1 12/26/2014 1618   ALBUMIN 4.3 12/26/2014 1618   AST 15 12/26/2014  1618   ALT 18 12/26/2014 1618   ALKPHOS 97 12/26/2014 1618   BILITOT 0.4 12/26/2014 1618   GFRNONAA 68* 10/31/2014 1510   GFRNONAA 62 04/23/2014 1500   GFRAA 79* 10/31/2014 1510   GFRAA 71 04/23/2014 1500        Assessment & Plan:  34 year old female with past medical history of HIV, ovarian teratoma status post right oophorectomy in April 6122 complicated by sigmoid colon injury,  history of liver masses status post resection in November 2014 found to be implants of teratoma, GERD, bipolar disorder, diabetes, and recent laparoscopic repair of incarcerated ventral hernia with mesh placement who is seen in follow-up  1. Abd pain/constipation -- her abdominal pain is likely secondary to fairly recent ventral hernia surgery. Her pain is mostly abdominal wall in nature and may be somewhat neuropathic after surgery. Exacerbated by constipation and abdominal bloating and with positions causing pressure on the abdomen. I recommended working to try to improve constipation, see #2. I also would like to start her on amitriptyline 25 mg daily at bedtime 1 week increasing to 50 mg after 1 week if tolerated. We discussed how this medication can help with neuropathic type abdominal pain. No evidence of recurrent ventral hernia.  2. Constipation -- somewhat chronic for her. Colonoscopy in 2014 was normal which is very reassuring. No evidence of colonic stricture despite having sigmoid colon injury during ovary surgery. Diarrhea with Linzess thus I will discontinue and begin lubiprostone 8 g twice a day.  3. GERD with indigestion -- normal endoscopy in September 2014, biopsy negative for H. pylori. Resume omeprazole 40 mg once daily 30 minutes before breakfast Follow-up in 3 months, to assess response

## 2015-03-05 NOTE — Patient Instructions (Signed)
We have sent in prescriptions to your pharmacy Take the Amitriptyline 25mg  every night at bedtime for 1 week then increase to 50mg  Discontinue Linzess Follow up in 3 months

## 2015-03-20 ENCOUNTER — Ambulatory Visit (INDEPENDENT_AMBULATORY_CARE_PROVIDER_SITE_OTHER): Payer: Medicaid Other | Admitting: Obstetrics & Gynecology

## 2015-03-20 ENCOUNTER — Other Ambulatory Visit (HOSPITAL_COMMUNITY)
Admission: RE | Admit: 2015-03-20 | Discharge: 2015-03-20 | Disposition: A | Discharge status: Home / Self Care | Payer: Medicaid Other | Source: Ambulatory Visit | Attending: Obstetrics & Gynecology | Admitting: Obstetrics & Gynecology

## 2015-03-20 ENCOUNTER — Encounter: Payer: Self-pay | Admitting: Obstetrics & Gynecology

## 2015-03-20 VITALS — BP 119/72 | HR 69 | Temp 97.5°F | Wt 183.9 lb

## 2015-03-20 DIAGNOSIS — B20 Human immunodeficiency virus [HIV] disease: Secondary | ICD-10-CM

## 2015-03-20 DIAGNOSIS — R896 Abnormal cytological findings in specimens from other organs, systems and tissues: Secondary | ICD-10-CM | POA: Diagnosis not present

## 2015-03-20 DIAGNOSIS — IMO0002 Reserved for concepts with insufficient information to code with codable children: Secondary | ICD-10-CM

## 2015-03-20 LAB — POCT PREGNANCY, URINE: Preg Test, Ur: NEGATIVE

## 2015-03-20 NOTE — Patient Instructions (Signed)

## 2015-03-20 NOTE — Progress Notes (Signed)
    GYNECOLOGY CLINIC COLPOSCOPY PROCEDURE NOTE  34 y.o. HIV positive G3P1021 here for colposcopy for low-grade squamous intraepithelial neoplasia (LGSIL - encompassing HPV,mild dysplasia,CIN I) pap smear on 05/30/2014.  Had previous similar pap in 05/2013, colposcopy showed CIN I.  Discussed role for HPV in cervical dysplasia, need for surveillance.  Patient given informed consent, signed copy in the chart, time out was performed.  Placed in lithotomy position. Cervix viewed with speculum and colposcope after application of acetic acid.   Colposcopy adequate? Yes No visible lesions; no biopsies obtained.  ECC specimen obtained, labeled and sent to pathology.  Patient was given post procedure instructions.  Will follow up pathology and manage accordingly; may consider treatment even for low grade lesions given duration of cervical dysplasia in the setting of HIV positivity.  Routine preventative health maintenance measures emphasized.    Verita Schneiders, MD, [place] Attending [place] for [employer], [place]

## 2015-03-25 ENCOUNTER — Telehealth: Payer: Self-pay | Admitting: General Practice

## 2015-03-25 NOTE — Telephone Encounter (Signed)
Telephone call to patient regarding negative colposcopy and need for follow up pap in one year with HPV testing per Dr Harolyn Rutherford. If pap LGSIL again may need to consider cryo or leep at that time. Called patient and informed her of results and recommendations. Patient verbalized understanding and stays that she was due for a pap this year. Told patient that isn't necessary since she just had the colpo, but she will need a pap next August. Patient verbalized understanding and had no questions

## 2015-03-27 ENCOUNTER — Other Ambulatory Visit: Payer: Self-pay | Admitting: Infectious Diseases

## 2015-03-28 ENCOUNTER — Other Ambulatory Visit: Payer: Self-pay | Admitting: Infectious Diseases

## 2015-03-31 ENCOUNTER — Other Ambulatory Visit: Payer: Self-pay | Admitting: *Deleted

## 2015-03-31 DIAGNOSIS — B009 Herpesviral infection, unspecified: Secondary | ICD-10-CM

## 2015-03-31 DIAGNOSIS — B20 Human immunodeficiency virus [HIV] disease: Secondary | ICD-10-CM

## 2015-03-31 MED ORDER — DARUNAVIR ETHANOLATE 800 MG PO TABS: ORAL_TABLET | ORAL | Status: DC

## 2015-03-31 MED ORDER — DOLUTEGRAVIR SODIUM 50 MG PO TABS: ORAL_TABLET | ORAL | Status: DC

## 2015-03-31 MED ORDER — EMTRICITABINE-TENOFOVIR DF 200-300 MG PO TABS: 1.0000 | ORAL_TABLET | Freq: Every day | ORAL | Status: DC

## 2015-03-31 MED ORDER — RITONAVIR 100 MG PO TABS: ORAL_TABLET | ORAL | Status: DC

## 2015-03-31 MED ORDER — VALACYCLOVIR HCL 500 MG PO TABS: ORAL_TABLET | ORAL | Status: DC

## 2015-04-18 ENCOUNTER — Emergency Department (HOSPITAL_COMMUNITY)
Admission: EM | Admit: 2015-04-18 | Discharge: 2015-04-18 | Disposition: A | Discharge status: Home / Self Care | Payer: Medicaid Other | Attending: Emergency Medicine | Admitting: Emergency Medicine

## 2015-04-18 ENCOUNTER — Encounter (HOSPITAL_COMMUNITY): Payer: Self-pay | Admitting: Emergency Medicine

## 2015-04-18 ENCOUNTER — Emergency Department (HOSPITAL_COMMUNITY): Payer: Medicaid Other

## 2015-04-18 DIAGNOSIS — M1990 Unspecified osteoarthritis, unspecified site: Secondary | ICD-10-CM | POA: Insufficient documentation

## 2015-04-18 DIAGNOSIS — F419 Anxiety disorder, unspecified: Secondary | ICD-10-CM | POA: Diagnosis not present

## 2015-04-18 DIAGNOSIS — Z91040 Latex allergy status: Secondary | ICD-10-CM | POA: Diagnosis not present

## 2015-04-18 DIAGNOSIS — Z862 Personal history of diseases of the blood and blood-forming organs and certain disorders involving the immune mechanism: Secondary | ICD-10-CM | POA: Insufficient documentation

## 2015-04-18 DIAGNOSIS — K219 Gastro-esophageal reflux disease without esophagitis: Secondary | ICD-10-CM | POA: Insufficient documentation

## 2015-04-18 DIAGNOSIS — J45901 Unspecified asthma with (acute) exacerbation: Secondary | ICD-10-CM | POA: Insufficient documentation

## 2015-04-18 DIAGNOSIS — G43909 Migraine, unspecified, not intractable, without status migrainosus: Secondary | ICD-10-CM | POA: Diagnosis not present

## 2015-04-18 DIAGNOSIS — Z79899 Other long term (current) drug therapy: Secondary | ICD-10-CM | POA: Insufficient documentation

## 2015-04-18 DIAGNOSIS — E119 Type 2 diabetes mellitus without complications: Secondary | ICD-10-CM | POA: Insufficient documentation

## 2015-04-18 DIAGNOSIS — R05 Cough: Secondary | ICD-10-CM | POA: Diagnosis present

## 2015-04-18 DIAGNOSIS — Z7951 Long term (current) use of inhaled steroids: Secondary | ICD-10-CM | POA: Insufficient documentation

## 2015-04-18 DIAGNOSIS — J189 Pneumonia, unspecified organism: Secondary | ICD-10-CM

## 2015-04-18 DIAGNOSIS — B20 Human immunodeficiency virus [HIV] disease: Secondary | ICD-10-CM | POA: Insufficient documentation

## 2015-04-18 DIAGNOSIS — Z8619 Personal history of other infectious and parasitic diseases: Secondary | ICD-10-CM | POA: Insufficient documentation

## 2015-04-18 DIAGNOSIS — J181 Lobar pneumonia, unspecified organism: Secondary | ICD-10-CM

## 2015-04-18 DIAGNOSIS — F319 Bipolar disorder, unspecified: Secondary | ICD-10-CM | POA: Diagnosis not present

## 2015-04-18 LAB — RAPID STREP SCREEN (MED CTR MEBANE ONLY): Streptococcus, Group A Screen (Direct): NEGATIVE

## 2015-04-18 MED ORDER — HYDROCODONE-HOMATROPINE 5-1.5 MG/5ML PO SYRP
5.0000 mL | ORAL_SOLUTION | Freq: Once | ORAL | Status: AC
  Administered 2015-04-18: 5 mL via ORAL
  Filled 2015-04-18: qty 5

## 2015-04-18 MED ORDER — AZITHROMYCIN 250 MG PO TABS: 250.0000 mg | ORAL_TABLET | Freq: Every day | ORAL | Status: DC

## 2015-04-18 MED ORDER — HYDROCOD POLST-CPM POLST ER 10-8 MG/5ML PO SUER: 5.0000 mL | Freq: Two times a day (BID) | ORAL | Status: DC | PRN

## 2015-04-18 MED ORDER — BENZONATATE 100 MG PO CAPS: 100.0000 mg | ORAL_CAPSULE | Freq: Three times a day (TID) | ORAL | Status: DC

## 2015-04-18 MED ORDER — AZITHROMYCIN 250 MG PO TABS
500.0000 mg | ORAL_TABLET | Freq: Once | ORAL | Status: AC
  Administered 2015-04-18: 500 mg via ORAL
  Filled 2015-04-18: qty 2

## 2015-04-18 MED ORDER — IPRATROPIUM-ALBUTEROL 0.5-2.5 (3) MG/3ML IN SOLN
3.0000 mL | Freq: Once | RESPIRATORY_TRACT | Status: AC
  Administered 2015-04-18: 3 mL via RESPIRATORY_TRACT
  Filled 2015-04-18: qty 3

## 2015-04-18 NOTE — ED Notes (Signed)
Per pt, states asthma symptoms for over a week-saw PCP and placed on prednisone and inhaler-some relief

## 2015-04-18 NOTE — Discharge Instructions (Signed)
Pneumonia, Adult Follow-up with your primary care provider on Monday. Return for fever, worsening off for shortness of breath, or difficulty breathing. Pneumonia is an infection of the lungs. It may be caused by a germ (virus or bacteria). Some types of pneumonia can spread easily from person to person. This can happen when you cough or sneeze. HOME CARE  Only take medicine as told by your doctor.  Take your medicine (antibiotics) as told. Finish it even if you start to feel better.  Do not smoke.  You may use a vaporizer or humidifier in your room. This can help loosen thick spit (mucus).  Sleep so you are almost sitting up (semi-upright). This helps reduce coughing.  Rest. A shot (vaccine) can help prevent pneumonia. Shots are often advised for:  People over 11 years old.  Patients on chemotherapy.  People with long-term (chronic) lung problems.  People with immune system problems. GET HELP RIGHT AWAY IF:   You are getting worse.  You cannot control your cough, and you are losing sleep.  You cough up blood.  Your pain gets worse, even with medicine.  You have a fever.  Any of your problems are getting worse, not better.  You have shortness of breath or chest pain. MAKE SURE YOU:   Understand these instructions.  Will watch your condition.  Will get help right away if you are not doing well or get worse. Document Released: 01/19/2008 Document Revised: 10/25/2011 Document Reviewed: 10/23/2010 [facility] Patient Information 2015 [place], [place]. This information is not intended to replace advice given to you by your health care provider. Make sure you discuss any questions you have with your health care provider.

## 2015-04-18 NOTE — ED Provider Notes (Signed)
CSN: [id number]     Arrival date & time 04/18/15  1329 History  This chart was scribed for non-physician practitioner, Maximiano Coss, PA-C, working with Lacretia Leigh, MD, by Helane Gunther ED Scribe. This patient was seen in room WTR6/WTR6 and the patient's care was started at 1:35 PM    Chief Complaint  Patient presents with  . Asthma   The history is provided by the patient. No language interpreter was used.   HPI Comments: [name] is a 34 y.o. female with a PMHx of asthma, HIV, DM, and status-post right oophorectomy who presents to the Emergency Department complaining of an asthma flare up onset earlier today. She reports associated intermittent and productive cough (clear sputum) onset 1 week ago. She notes she has used her inhaler 7 times today with little relief. She has seen her PCP 4 days ago for the same, and was given 2 breathing treatments. She was discharged home with prednisone (final dose today). She is scheduled to f/u with her PCP at 3:45 PM today. She is currently taking prednisone and Symbicort. Pt denies fever, chills, chest pain, abdominal pain, nausea, vomiting.   Past Medical History  Diagnosis Date  . HIV infection   . Anxiety   . Depression   . Asthma   . GERD (gastroesophageal reflux disease)   . Neuromuscular disorder     carpal tunnel in both arms  . Human immunodeficiency virus (HIV) disease 12/07/2012  . Herpes genitalis in women 12/07/2012  . Bipolar disorder, unspecified 12/07/2012  . Anxiety state, unspecified 12/07/2012  . Unspecified asthma(493.90) 12/07/2012  . GERD (gastroesophageal reflux disease) 12/07/2012  . Anemia   . Arthritis   . Allergy   . Headache(784.0)     migraines  . Diabetes mellitus without complication     no meds currently- controlled with diet  . Type II or unspecified type diabetes mellitus without mention of complication, not stated as uncontrolled 12/07/2012   Past Surgical History  Procedure Laterality Date  .  Cesarean section    . Robotic assisted bilateral salpingo oopherectomy Right 12/04/2012    Procedure: ROBOTIC ASSISTED Right  SALPINGO OOPHORECTOMY;  Surgeon: [name], MD;  Location: [facility];  Service: Gynecology;  Laterality: Right;  . Laparotomy N/A 12/04/2012    Procedure: EXPLORATORY LAPAROTOMY;  Surgeon: [name], MD;  Location: [facility];  Service: Gynecology;  Laterality: N/A;  repair of serosal injury  . Laparoscopic lysis of adhesions N/A 12/04/2012    Procedure: LAPAROSCOPIC LYSIS OF ADHESIONS;  Surgeon: [name], MD;  Location: [facility];  Service: Gynecology;  Laterality: N/A;  . Colon surgery      colon repair after salpingo  . Laparoscopic hepatectomy N/A 06/28/2013    Procedure: LAPAROSCOPIC EXCISION HEPATIC MASS;  Surgeon: Stark Klein, MD;  Location: [facility];  Service: General;  Laterality: N/A;  . Ventral hernia repair N/A 11/06/2014    Procedure: LAPAROSCOPIC ASSITED INCARCERATED VENTRAL HERNIA REPAIR WITH MESH;  Surgeon: Stark Klein, MD;  Location: [facility];  Service: General;  Laterality: N/A;  . Insertion of mesh N/A 11/06/2014    Procedure: INSERTION OF MESH;  Surgeon: Stark Klein, MD;  Location: [facility];  Service: General;  Laterality: N/A;   Family History  Problem Relation Age of Onset  . Arthritis/Rheumatoid Mother   . Hypertension Mother   . Cancer Maternal Aunt     brand and lung  . Cancer Maternal Grandmother     breasts  . Cancer Paternal Grandmother  breast and kidney   Social History  Substance Use Topics  . Smoking status: Never Smoker   . Smokeless tobacco: Never Used  . Alcohol Use: 0.0 oz/week    0 Standard drinks or equivalent per week     Comment: OCC.   OB History    Gravida Para Term Preterm AB TAB SAB Ectopic Multiple Living   3 1 1  0 2 1 1  0 0 1     Review of Systems  Constitutional: Negative for fever and chills.  Respiratory: Positive for cough, chest tightness and shortness of breath. Negative for wheezing.    Cardiovascular: Negative for chest pain.  All other systems reviewed and are negative.   Allergies  Benzoin; Latex; and Sulfamethoxazole-trimethoprim  Home Medications   Prior to Admission medications   Medication Sig Start Date End Date Taking? Authorizing Provider  albuterol (PROVENTIL HFA;VENTOLIN HFA) 108 (90 BASE) MCG/ACT inhaler Inhale 2 puffs into the lungs every 6 (six) hours as needed for wheezing.    Historical Provider, MD  amitriptyline (ELAVIL) 50 MG tablet Take 1 tablet (50 mg total) by mouth at bedtime. 03/05/15   Jerene Bears, MD  azithromycin (ZITHROMAX) 250 MG tablet Take 1 tablet (250 mg total) by mouth daily. You had your first dose in the ED on 04/18/15.  Take one pill a day for the next 4 days beginning Saturday 04/19/15. 04/18/15   Ottie Glazier, PA-C  beclomethasone (QVAR) 40 MCG/ACT inhaler Inhale 2 puffs into the lungs 2 (two) times daily.    Historical Provider, MD  benzonatate (TESSALON) 100 MG capsule Take 1 capsule (100 mg total) by mouth every 8 (eight) hours. 04/18/15   Shariah Assad Patel-Mills, PA-C  cetirizine (ZYRTEC) 10 MG tablet Take 10 mg by mouth daily.    Historical Provider, MD  chlorpheniramine-HYDROcodone (TUSSIONEX PENNKINETIC ER) 10-8 MG/5ML SUER Take 5 mLs by mouth every 12 (twelve) hours as needed for cough. 04/18/15   Jaleena Viviani Patel-Mills, PA-C  Darunavir Ethanolate (PREZISTA) 800 MG tablet TAKE 1 TABLET (800 MG TOTAL) BY MOUTH DAILY. 03/31/15   Campbell Riches, MD  dolutegravir (TIVICAY) 50 MG tablet TAKE 1 TABLET (50 MG TOTAL) BY MOUTH DAILY. 03/31/15   Campbell Riches, MD  emtricitabine-tenofovir (TRUVADA) 200-300 MG per tablet Take 1 tablet by mouth daily. 03/31/15   Campbell Riches, MD  fexofenadine-pseudoephedrine (ALLEGRA-D 24) 180-240 MG per 24 hr tablet Take 1 tablet by mouth daily.    Historical Provider, MD  flunisolide (NASALIDE) 25 MCG/ACT (0.025%) SOLN PLACE TWO   SPRAYS INTO EACH NOSTRIL AT BEDTIME. 02/28/15   Truman [place]ward, MD  ibuprofen  (ADVIL,MOTRIN) 800 MG tablet Take 1 tablet (800 mg total) by mouth 3 (three) times daily with meals. 05/13/14   Harvie Heck, PA-C  lubiprostone (AMITIZA) 8 MCG capsule Take 1 capsule (8 mcg total) by mouth 2 (two) times daily with a meal. 03/05/15   Jerene Bears, MD  mometasone (NASONEX) 50 MCG/ACT nasal spray Place 2 sprays into the nose daily. 01/08/15   Truman Hayward, MD  montelukast (SINGULAIR) 10 MG tablet Take 10 mg by mouth at bedtime.    Historical Provider, MD  omeprazole (PRILOSEC) 40 MG capsule TAKE 1 CAPSULE (40 MG TOTAL) BY MOUTH DAILY. 10/22/14   Jerene Bears, MD  omeprazole (PRILOSEC) 40 MG capsule Take 1 capsule (40 mg total) by mouth daily. 03/05/15   Jerene Bears, MD  ritonavir (NORVIR) 100 MG TABS tablet TAKE 1 TABLET (100 MG  TOTAL) BY MOUTH DAILY. 03/31/15   Campbell Riches, MD  valACYclovir (VALTREX) 500 MG tablet TAKE 1 TABLET (500 MG TOTAL) BY MOUTH DAILY. 03/31/15   Campbell Riches, MD   BP 132/83 mmHg  Pulse 90  Temp(Src) 98 F (36.7 C) (Oral)  Resp 20  SpO2 99% Physical Exam  Constitutional: She appears well-developed and well-nourished.  HENT:  Head: Normocephalic and atraumatic.  Mouth/Throat: Uvula is midline, oropharynx is clear and moist and mucous membranes are normal. No trismus in the jaw. No oropharyngeal exudate, posterior oropharyngeal edema, posterior oropharyngeal erythema or tonsillar abscesses.  Patent airway.Posterior oropharynxis without edema or erythema. No tonsillar exudates or abscess.  Eyes: Conjunctivae are normal. Right eye exhibits no discharge. Left eye exhibits no discharge.  Neck: Normal range of motion.  Cardiovascular: Normal rate, regular rhythm and normal heart sounds.   Pulmonary/Chest: Breath sounds normal. No accessory muscle usage. Tachypnea noted. No respiratory distress. She has no decreased breath sounds. She has no wheezes. She has no rhonchi. She has no rales.  RR of 25. 98% oxygen on room air. She is well appearing.  No  acute respiratory distress. Lungs clear to auscultation bilaterally. No wheezing or rhonchi.  Neurological: She is alert. Coordination normal.  Skin: Skin is warm and dry. No rash noted. She is not diaphoretic. No erythema.  Psychiatric: She has a normal mood and affect.  Nursing note and vitals reviewed.   ED Course  Procedures  DIAGNOSTIC STUDIES: Oxygen Saturation is 98% on RA, normal by my interpretation.    COORDINATION OF CARE: 1:38 PM - Discussed plans to order a chest XR. Pt advised of plan for treatment and pt agrees.  Labs Review Labs Reviewed  RAPID STREP SCREEN (NOT AT [facility])  CULTURE, GROUP A STREP    Imaging Review Dg Chest 2 View  04/18/2015   CLINICAL DATA:  Asthma, productive cough for 1 week  EXAM: CHEST  2 VIEW  COMPARISON:  01/15/2015  FINDINGS: There is focal consolidation in the right lower lobe. There is no other focal parenchymal opacity. There is no pleural effusion or pneumothorax. The heart and mediastinal contours are unremarkable. The osseous structures are unremarkable.  IMPRESSION: Right lower lobe pneumonia.   Electronically Signed   By: Kathreen Devoid   On: 04/18/2015 14:33   I have personally reviewed and evaluated these images as part of my medical decision-making.   EKG Interpretation None      MDM   Final diagnoses:  Right lower lobe pneumonia  Patient with history of HIV and asthma presents for shortness of breath. RR20 and 98% oxygen on RA. She is afebrile and vitals appear stable. She was given one nebulizer treatment. Recheck:  She is now complaining of throat pain. She refused additional nebulizer treatment. Chest x-ray shows right lower lobe pneumonia. Strep negative.She has no concerning signs at this time that would prompt me to admit her. I have given her azithromycin and tussionex. She states that no other cough medicine works for her including Conservation officer, nature. I believe that this medication will help her cough and will help  her sleep at night. I gave her strict return precautions such as fever, shortness of breath, difficulty breathing or no improvement in 48 hours. I also discussed following up with her primary care physician on Monday. She states her last CD4 count in May was around 1200. I spoke to Dr. Zenia Resides regarding this patient and he agrees that the patient does not meet  inpatient criteria at this time.  Medications  ipratropium-albuterol (DUONEB) 0.5-2.5 (3) MG/3ML nebulizer solution 3 mL (3 mLs Nebulization Given 04/18/15 1340)  azithromycin (ZITHROMAX) tablet 500 mg (500 mg Oral Given 04/18/15 1502)  HYDROcodone-homatropine (HYCODAN) 5-1.5 MG/5ML syrup 5 mL (5 mLs Oral Given 04/18/15 1517)   I personally performed the services described in this documentation, which was scribed in my presence. The recorded information has been reviewed and is accurate.   Ottie Glazier, PA-C 04/18/15 [place], MD 04/25/15 [phone]

## 2015-04-21 LAB — CULTURE, GROUP A STREP: STREP A CULTURE: NEGATIVE

## 2015-05-02 ENCOUNTER — Ambulatory Visit
Admission: RE | Admit: 2015-05-02 | Discharge: 2015-05-02 | Disposition: A | Discharge status: Home / Self Care | Payer: Medicaid Other | Source: Ambulatory Visit | Attending: Pulmonary Disease | Admitting: Pulmonary Disease

## 2015-05-02 ENCOUNTER — Other Ambulatory Visit: Payer: Self-pay | Admitting: Pulmonary Disease

## 2015-05-02 DIAGNOSIS — J019 Acute sinusitis, unspecified: Secondary | ICD-10-CM

## 2015-05-02 DIAGNOSIS — J189 Pneumonia, unspecified organism: Secondary | ICD-10-CM

## 2015-05-13 ENCOUNTER — Ambulatory Visit: Payer: Medicaid Other

## 2015-05-23 ENCOUNTER — Other Ambulatory Visit: Payer: Self-pay | Admitting: Internal Medicine

## 2015-05-28 ENCOUNTER — Other Ambulatory Visit: Payer: Self-pay | Admitting: Internal Medicine

## 2015-06-06 ENCOUNTER — Ambulatory Visit (INDEPENDENT_AMBULATORY_CARE_PROVIDER_SITE_OTHER): Payer: Medicaid Other | Admitting: *Deleted

## 2015-06-06 ENCOUNTER — Other Ambulatory Visit (HOSPITAL_COMMUNITY)
Admission: RE | Admit: 2015-06-06 | Discharge: 2015-06-06 | Disposition: A | Discharge status: Home / Self Care | Payer: Medicaid Other | Source: Ambulatory Visit | Attending: Internal Medicine | Admitting: Internal Medicine

## 2015-06-06 DIAGNOSIS — Z113 Encounter for screening for infections with a predominantly sexual mode of transmission: Secondary | ICD-10-CM | POA: Insufficient documentation

## 2015-06-06 DIAGNOSIS — Z124 Encounter for screening for malignant neoplasm of cervix: Secondary | ICD-10-CM | POA: Diagnosis not present

## 2015-06-06 DIAGNOSIS — Z01411 Encounter for gynecological examination (general) (routine) with abnormal findings: Secondary | ICD-10-CM | POA: Insufficient documentation

## 2015-06-06 DIAGNOSIS — N760 Acute vaginitis: Secondary | ICD-10-CM | POA: Insufficient documentation

## 2015-06-06 NOTE — Progress Notes (Signed)
  Subjective:     [name] is a 34 y.o. woman who comes in today for a  pap smear only. Previous abnormal Pap smears: yes. Contraception:  Norplant, placed 11/27/14.  Pt c/o itching, odor and clear/cloudy vaginal discharge x 2 weeks.   Objective:    There were no vitals taken for this visit. Pelvic Exam: Pap smear obtained.   Assessment:    Screening pap smear.   Plan:    Follow up in one year, or as indicated by Pap results.  Pt given educational materials re: HIV and women, self-esteem, BSE, nutrition and diet management, PAP smears and partner safety. Pt given condoms

## 2015-06-06 NOTE — Patient Instructions (Signed)
Your results will be ready in about a week.  If there is any infection that needs treatment I will call you.  Thank you for coming to the Center for your care.  Langley Gauss, RN

## 2015-06-09 LAB — CERVICOVAGINAL ANCILLARY ONLY
CANDIDA VAGINITIS: NEGATIVE
Chlamydia: NEGATIVE
NEISSERIA GONORRHEA: NEGATIVE
Trichomonas: NEGATIVE

## 2015-06-09 LAB — CYTOLOGY - PAP

## 2015-06-17 ENCOUNTER — Ambulatory Visit (INDEPENDENT_AMBULATORY_CARE_PROVIDER_SITE_OTHER): Payer: Medicaid Other | Admitting: Obstetrics and Gynecology

## 2015-06-17 ENCOUNTER — Encounter: Payer: Self-pay | Admitting: Obstetrics and Gynecology

## 2015-06-17 VITALS — BP 120/80 | HR 81 | Temp 97.7°F | Ht 64.0 in | Wt 184.7 lb

## 2015-06-17 DIAGNOSIS — N898 Other specified noninflammatory disorders of vagina: Secondary | ICD-10-CM

## 2015-06-17 NOTE — Progress Notes (Signed)
CLINIC ENCOUNTER NOTE  History:  34 y.o. [id number] here today for vaginal discharge.  Began 2 weeks ago. Some itching. Saw another provider, wet prep negative (not tested for BV). Has latex allergy and tried sex with a latex condom recently, thinks that might have been the problem. Today mild discharge, itching improving. No dysuria or hematuria. Nexplanon in place for birth control. No pelvic pain or fever.    Past Medical History  Diagnosis Date  . HIV infection ([place])   . Anxiety   . Depression   . Asthma   . GERD (gastroesophageal reflux disease)   . 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 Human immunodeficiency virus (HIV) disease ([place]) 12/07/2012  . Herpes genitalis in women 12/07/2012  . Bipolar disorder, unspecified ([place]) 12/07/2012  . Anxiety state, unspecified 12/07/2012  . Unspecified asthma(493.90) 12/07/2012  . GERD (gastroesophageal reflux disease) 12/07/2012  . Anemia   . Arthritis   . Allergy   . Headache(784.0)     migraines  . Diabetes mellitus without complication ([place])     no meds currently- controlled with diet  . Type II or unspecified type diabetes mellitus without mention of complication, not stated as uncontrolled 12/07/2012    Past Surgical History  Procedure Laterality Date  . Cesarean section    . Robotic assisted bilateral salpingo oopherectomy Right 12/04/2012    Procedure: ROBOTIC ASSISTED Right  SALPINGO OOPHORECTOMY;  Surgeon: [name], MD;  Location: [facility];  Service: Gynecology;  Laterality: Right;  . Laparotomy N/A 12/04/2012    Procedure: EXPLORATORY LAPAROTOMY;  Surgeon: [name], MD;  Location: [facility];  Service: Gynecology;  Laterality: N/A;  repair of serosal injury  . Laparoscopic lysis of adhesions N/A 12/04/2012    Procedure: LAPAROSCOPIC LYSIS OF ADHESIONS;  Surgeon: [name], MD;  Location: [facility];  Service: Gynecology;  Laterality: N/A;  . Colon surgery      colon repair after salpingo   . Laparoscopic hepatectomy N/A 06/28/2013    Procedure: LAPAROSCOPIC EXCISION HEPATIC MASS;  Surgeon: Stark Klein, MD;  Location: [facility];  Service: General;  Laterality: N/A;  . Ventral hernia repair N/A 11/06/2014    Procedure: LAPAROSCOPIC ASSITED INCARCERATED VENTRAL HERNIA REPAIR WITH MESH;  Surgeon: Stark Klein, MD;  Location: [facility];  Service: General;  Laterality: N/A;  . Insertion of mesh N/A 11/06/2014    Procedure: INSERTION OF MESH;  Surgeon: Stark Klein, MD;  Location: [facility];  Service: General;  Laterality: N/A;    The following portions of the patient's history were reviewed and updated as appropriate: allergies, current medications, past family history, past medical history, past social history, past surgical history and problem list.    Review of Systems:  See above; comprehensive review of systems was otherwise negative.  Objective:  Physical Exam BP 120/80 mmHg  Pulse 81  Temp(Src) 97.7 F (36.5 C) (Oral)  Ht 5\' 4"  (1.626 m)  Wt 184 lb 11.2 oz (83.779 kg)  BMI 31.69 kg/m2 CONSTITUTIONAL: Well-developed, well-nourished female in no acute distress.  HENT:  Normocephalic, atraumatic SKIN: Skin is warm and dry.  [place]: Alert  PSYCHIATRIC: Normal mood and affect.  CARDIOVASCULAR: Normal heart rate noted RESPIRATORY: Effort and breath sounds normal, no problems with respiration noted ABDOMEN: Soft, no distention noted.  No tenderness, rebound or guarding.     Labs and Imaging No results found.  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     Rayan Dyal B. Samnang Shugars, [place] for [employer], [place]

## 2015-06-18 ENCOUNTER — Telehealth: Payer: Self-pay | Admitting: *Deleted

## 2015-06-18 ENCOUNTER — Telehealth: Payer: Self-pay | Admitting: Obstetrics and Gynecology

## 2015-06-18 LAB — WET PREP, GENITAL
TRICH WET PREP: NONE SEEN
WBC WET PREP: NONE SEEN
Yeast Wet Prep HPF POC: NONE SEEN

## 2015-06-18 MED ORDER — METRONIDAZOLE 500 MG PO TABS: 500.0000 mg | ORAL_TABLET | Freq: Two times a day (BID) | ORAL | Status: DC

## 2015-06-18 NOTE — Addendum Note (Signed)
Addended by: Laurey Arrow B on: 06/18/2015 08:40 AM   Modules accepted: Orders

## 2015-06-18 NOTE — Telephone Encounter (Signed)
BV positive, will treat.

## 2015-06-18 NOTE — Telephone Encounter (Signed)
Per Dr. Si Raider, pt has BV and Rx sent to pharmacy for treatment. I called pt and informed her of test results as well as Rx sent.  She voiced understanding of information and instructions given.

## 2015-06-19 ENCOUNTER — Other Ambulatory Visit: Payer: Self-pay | Admitting: Internal Medicine

## 2015-06-19 ENCOUNTER — Other Ambulatory Visit: Payer: Self-pay | Admitting: Infectious Diseases

## 2015-06-30 ENCOUNTER — Other Ambulatory Visit: Payer: Self-pay | Admitting: Infectious Diseases

## 2015-06-30 ENCOUNTER — Telehealth: Payer: Self-pay | Admitting: *Deleted

## 2015-06-30 ENCOUNTER — Other Ambulatory Visit: Payer: Self-pay | Admitting: *Deleted

## 2015-06-30 DIAGNOSIS — J019 Acute sinusitis, unspecified: Secondary | ICD-10-CM

## 2015-06-30 DIAGNOSIS — J329 Chronic sinusitis, unspecified: Secondary | ICD-10-CM | POA: Insufficient documentation

## 2015-06-30 MED ORDER — AZITHROMYCIN 250 MG PO TABS: ORAL_TABLET | ORAL | Status: DC

## 2015-06-30 MED ORDER — AZITHROMYCIN 250 MG PO TABS: ORAL_TABLET | ORAL | Status: AC

## 2015-06-30 NOTE — Telephone Encounter (Signed)
Rx sent to [facility]; patient aware. [name]

## 2015-06-30 NOTE — Telephone Encounter (Signed)
z-pack written

## 2015-06-30 NOTE — Telephone Encounter (Signed)
Patient called requesting an Rx for sinus pain and pressure. She said she has tried OTC meds with no relief. Please advise

## 2015-07-02 ENCOUNTER — Other Ambulatory Visit: Payer: Medicaid Other

## 2015-07-02 DIAGNOSIS — B20 Human immunodeficiency virus [HIV] disease: Secondary | ICD-10-CM

## 2015-07-02 LAB — COMPREHENSIVE METABOLIC PANEL
ALBUMIN: 3.9 g/dL (ref 3.6–5.1)
ALT: 12 U/L (ref 6–29)
AST: 16 U/L (ref 10–30)
Alkaline Phosphatase: 72 U/L (ref 33–115)
BILIRUBIN TOTAL: 0.4 mg/dL (ref 0.2–1.2)
BUN: 14 mg/dL (ref 7–25)
CO2: 23 mmol/L (ref 20–31)
CREATININE: 1.04 mg/dL (ref 0.50–1.10)
Calcium: 8.8 mg/dL (ref 8.6–10.2)
Chloride: 105 mmol/L (ref 98–110)
GLUCOSE: 84 mg/dL (ref 65–99)
Potassium: 5 mmol/L (ref 3.5–5.3)
SODIUM: 137 mmol/L (ref 135–146)
Total Protein: 6.4 g/dL (ref 6.1–8.1)

## 2015-07-02 LAB — CBC
HCT: 41.3 % (ref 36.0–46.0)
Hemoglobin: 14 g/dL (ref 12.0–15.0)
MCH: 31.9 pg (ref 26.0–34.0)
MCHC: 33.9 g/dL (ref 30.0–36.0)
MCV: 94.1 fL (ref 78.0–100.0)
MPV: 9.7 fL (ref 8.6–12.4)
Platelets: 299 10*3/uL (ref 150–400)
RBC: 4.39 MIL/uL (ref 3.87–5.11)
RDW: 14.3 % (ref 11.5–15.5)
WBC: 8.6 10*3/uL (ref 4.0–10.5)

## 2015-07-03 LAB — T-HELPER CELL (CD4) - (RCID CLINIC ONLY)
CD4 T CELL ABS: 1200 /uL (ref 400–2700)
CD4 T CELL HELPER: 34 % (ref 33–55)

## 2015-07-04 LAB — HIV-1 RNA QUANT-NO REFLEX-BLD: HIV 1 RNA Quant: <20 copies/mL (ref <20)

## 2015-07-08 ENCOUNTER — Telehealth: Payer: Self-pay | Admitting: *Deleted

## 2015-07-08 MED ORDER — FLUCONAZOLE 150 MG PO TABS: 150.0000 mg | ORAL_TABLET | Freq: Every day | ORAL | Status: DC

## 2015-07-08 NOTE — Telephone Encounter (Signed)
Pt left message stating that she was recently seen. She requests Rx for Diflucan sent to her pharmacy. Per chart review, pt was treated for BV on 11/2 w/Flagyl.  Rx sent to pt's pharmacy as requested per standing order. Pt was called and informed that Rx has been sent and she should call back if she is still having sx after 4 days. Pt voiced understanding.

## 2015-07-16 ENCOUNTER — Encounter: Payer: Self-pay | Admitting: Infectious Diseases

## 2015-07-16 ENCOUNTER — Ambulatory Visit (INDEPENDENT_AMBULATORY_CARE_PROVIDER_SITE_OTHER): Payer: Medicaid Other | Admitting: Infectious Diseases

## 2015-07-16 VITALS — BP 125/83 | HR 79 | Temp 97.5°F | Ht 64.0 in | Wt 186.4 lb

## 2015-07-16 DIAGNOSIS — J309 Allergic rhinitis, unspecified: Secondary | ICD-10-CM

## 2015-07-16 DIAGNOSIS — R896 Abnormal cytological findings in specimens from other organs, systems and tissues: Secondary | ICD-10-CM

## 2015-07-16 DIAGNOSIS — Z79899 Other long term (current) drug therapy: Secondary | ICD-10-CM | POA: Diagnosis not present

## 2015-07-16 DIAGNOSIS — Z113 Encounter for screening for infections with a predominantly sexual mode of transmission: Secondary | ICD-10-CM

## 2015-07-16 DIAGNOSIS — IMO0002 Reserved for concepts with insufficient information to code with codable children: Secondary | ICD-10-CM

## 2015-07-16 DIAGNOSIS — B20 Human immunodeficiency virus [HIV] disease: Secondary | ICD-10-CM

## 2015-07-16 MED ORDER — ELVITEG-COBIC-EMTRICIT-TENOFAF 150-150-200-10 MG PO TABS: 1.0000 | ORAL_TABLET | Freq: Every day | ORAL | Status: DC

## 2015-07-16 NOTE — Progress Notes (Signed)
   Subjective:    Patient ID: [name], female    DOB: November 14, 1980, 34 y.o.   MRN: [id number]  HPI 34 yo F with hx of HIV+ on DRVr/TRV/DTGV. 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 States she now has small lesion on her L ovary and does not want to be cut on right now.  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 Had colpo this year and was told it was normal, to come back next year.  No problems with ART. Would like to be on [place].  Has been taking flonase, having sinus issues.   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   Review of Systems  Constitutional: Negative for appetite change and unexpected weight change.  HENT: Positive for sinus pressure.   Gastrointestinal: Negative for diarrhea and constipation.  Genitourinary: Negative for difficulty urinating and menstrual problem.  Psychiatric/Behavioral: Positive for sleep disturbance.   Has not taken insomnia meds due to risk of alzheimer's. Currently sleeping 6h/night.      Objective:   Physical Exam  Constitutional: She appears well-developed and well-nourished.  HENT:  Mouth/Throat: No oropharyngeal exudate.  Eyes: EOM are normal. Pupils are equal, round, and reactive to light.  Neck: Neck supple.  Cardiovascular: Normal rate, regular rhythm and normal heart sounds.   Pulmonary/Chest: Effort normal and breath sounds normal.  Abdominal: Soft. Bowel sounds are normal. There is no tenderness. There is no rebound.  Musculoskeletal: She exhibits no edema.  Lymphadenopathy:    She has no cervical adenopathy.       Assessment & Plan:

## 2015-07-16 NOTE — Assessment & Plan Note (Signed)
She needs to f/u at [facility] for repeat PAP/colpo Her repeat last month was CIN

## 2015-07-16 NOTE — Assessment & Plan Note (Signed)
Will change her to genvoya , drv.  Offered/refused condoms.  She has gotten flu shot Will see her back in 3-4 months

## 2015-07-16 NOTE — Assessment & Plan Note (Signed)
i asked her to try the flonase for 7 days consecutively then stop as it will interact with her ART.  I asked her to try allegra or zyrtec or claritin.

## 2015-08-06 ENCOUNTER — Other Ambulatory Visit: Payer: Self-pay | Admitting: Internal Medicine

## 2015-08-07 IMAGING — CR DG ABDOMEN 2V
2 series · 2 of 2 positions shown · non-contrast
Comparison: 04/20/2013 CT.

CLINICAL DATA: Lower abdominal pain. Constipation. Bloating with
loose stools.

EXAM:
ABDOMEN - 2 VIEW

[view not recorded (1 of 2)]
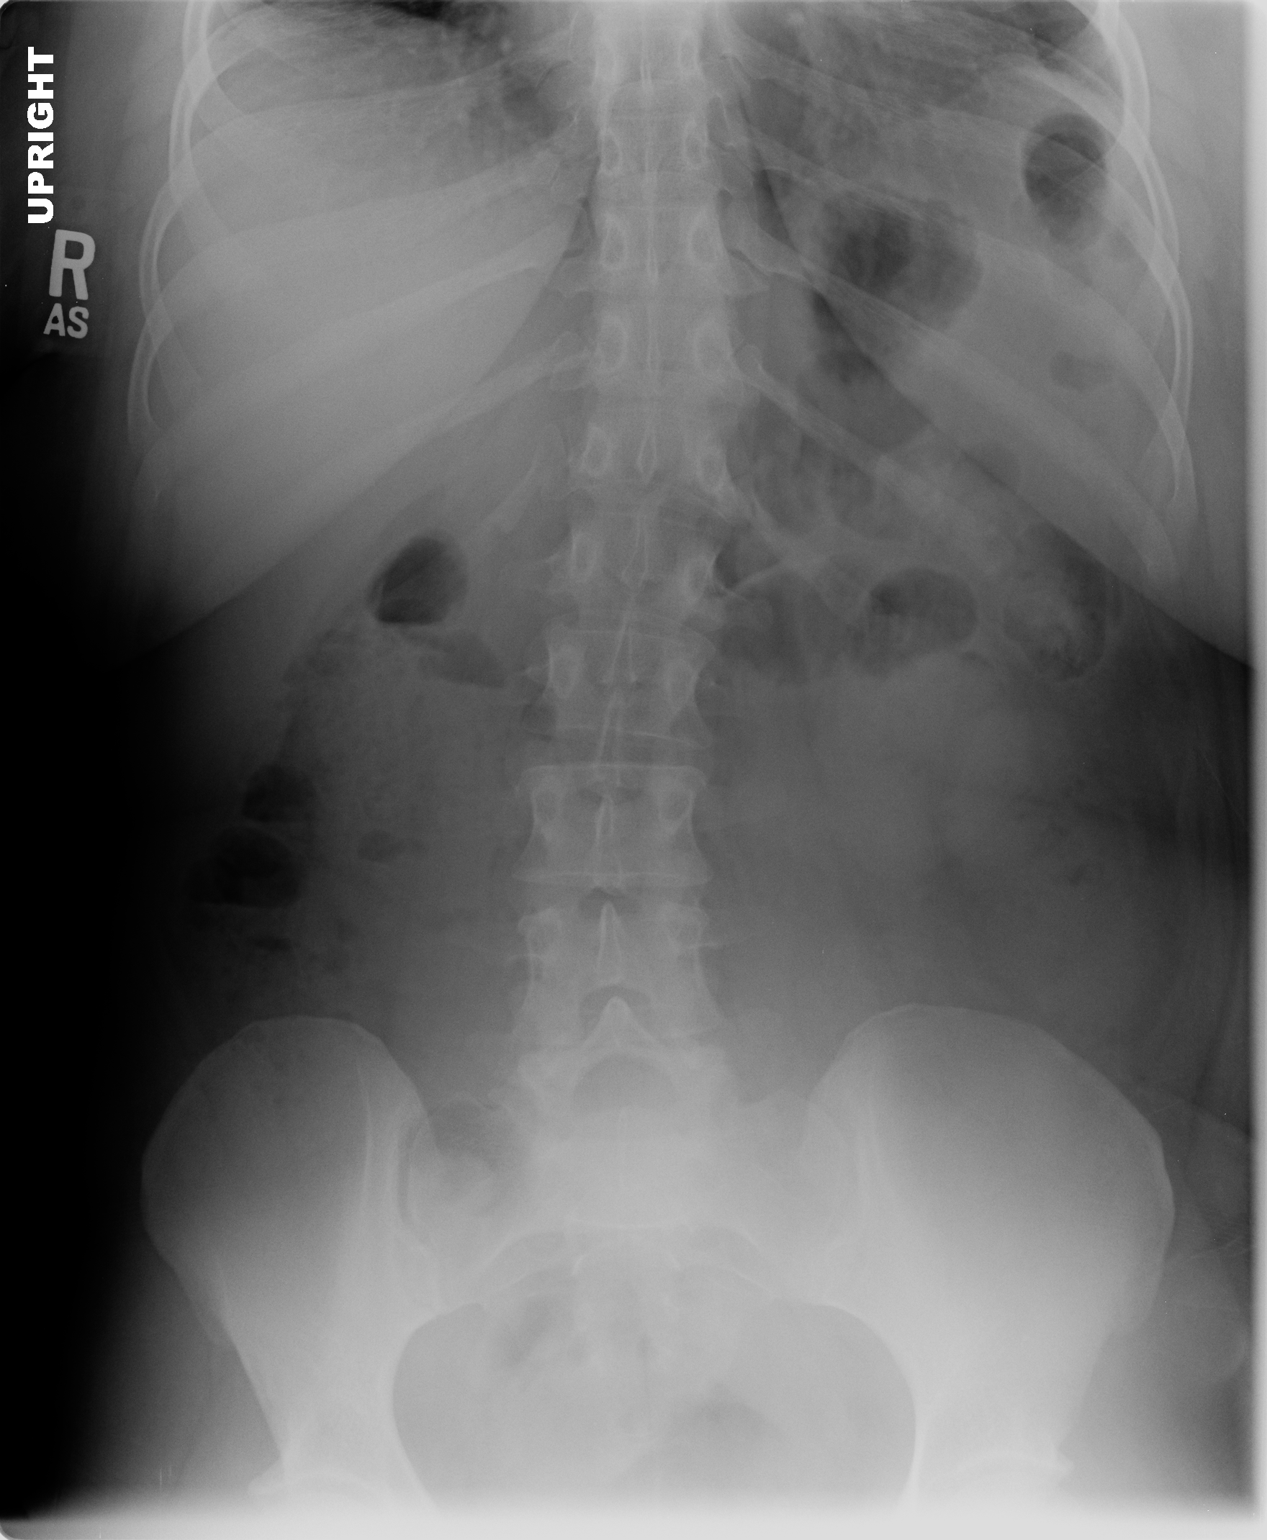

[view not recorded (2 of 2)]
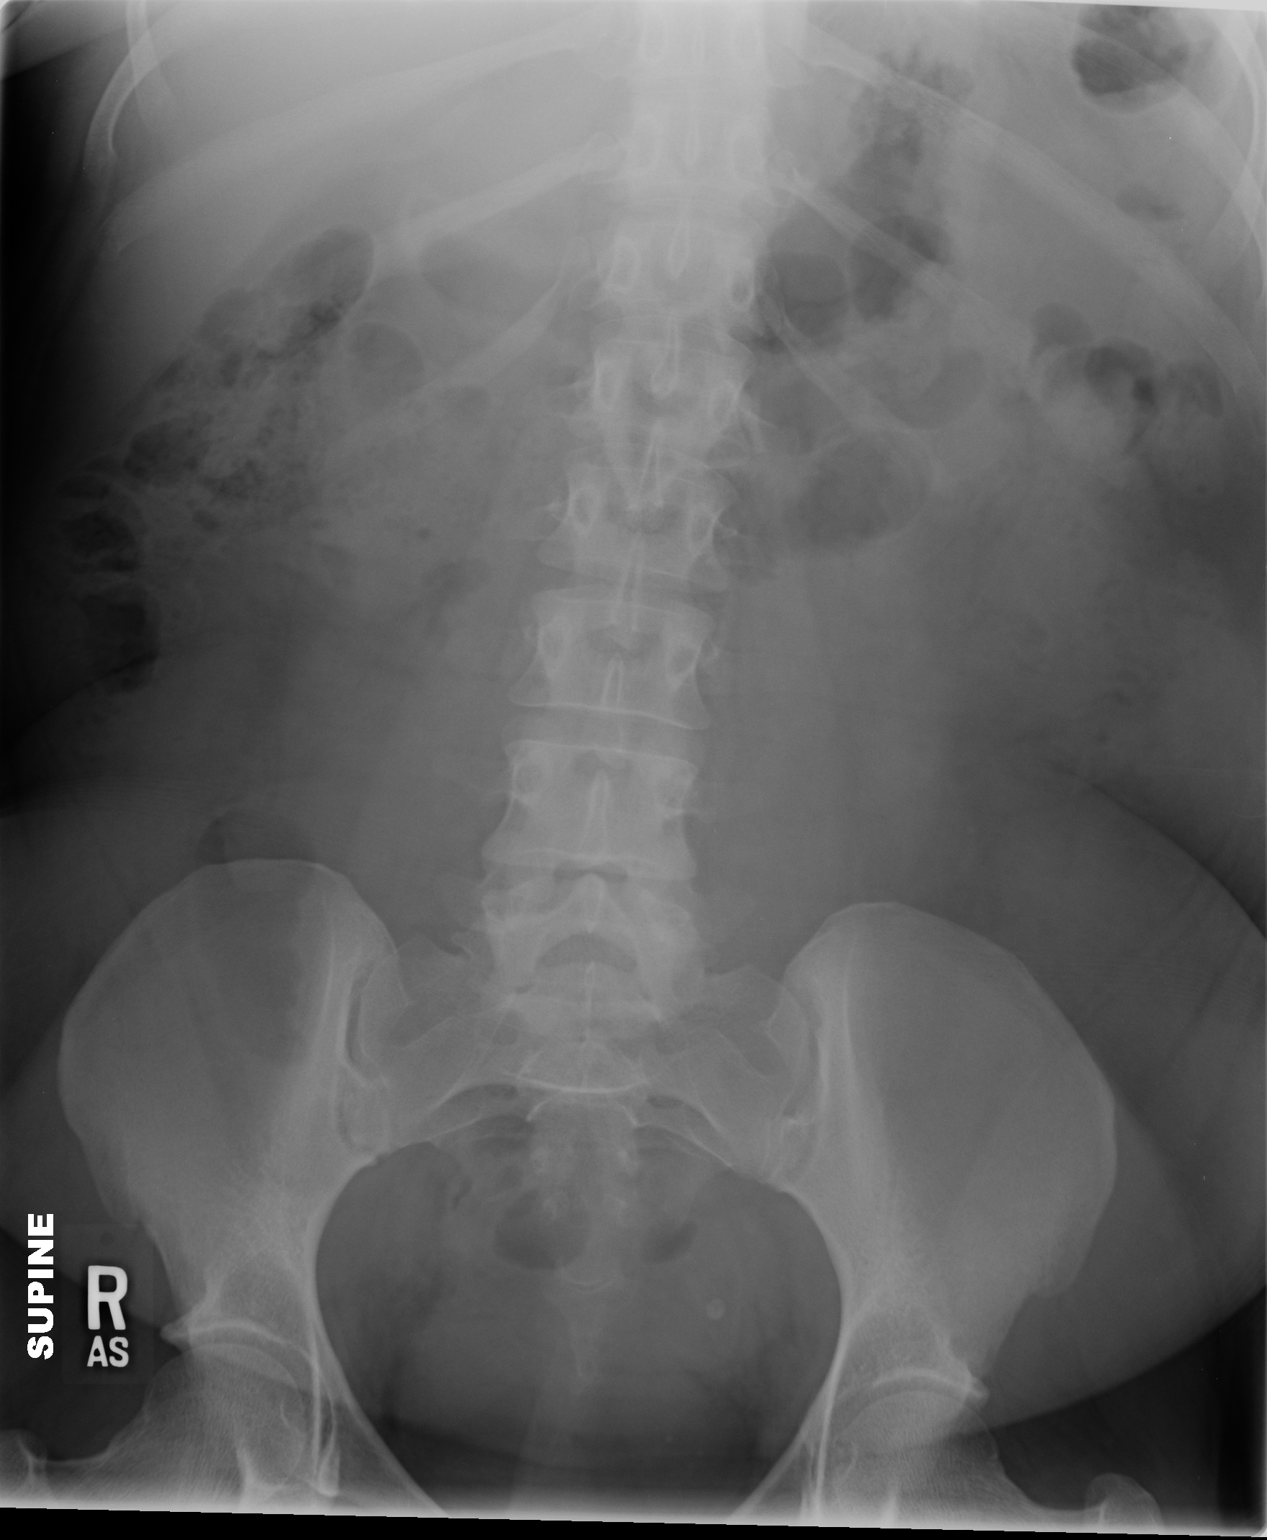

[2 of 2 positions shown; findings below may reference images not displayed]

FINDINGS: No free air. Normal bowel gas pattern is present. Gas is present in
the anatomic pelvis, probably within loops of small bowel. There
does appear to be colonic gas at the splenic flexure. A few
scattered nonspecific air-fluid levels are present within small
bowel. No bowel dilation.
IMPRESSION: Bowel gas pattern within normal limits.

## 2015-08-11 ENCOUNTER — Other Ambulatory Visit: Payer: Self-pay | Admitting: Internal Medicine

## 2015-08-12 ENCOUNTER — Other Ambulatory Visit: Payer: Self-pay | Admitting: Internal Medicine

## 2015-09-22 ENCOUNTER — Other Ambulatory Visit: Payer: Self-pay | Admitting: *Deleted

## 2015-09-22 ENCOUNTER — Telehealth: Payer: Self-pay | Admitting: *Deleted

## 2015-09-22 DIAGNOSIS — B20 Human immunodeficiency virus [HIV] disease: Secondary | ICD-10-CM

## 2015-09-22 DIAGNOSIS — B009 Herpesviral infection, unspecified: Secondary | ICD-10-CM

## 2015-09-22 MED ORDER — ELVITEG-COBIC-EMTRICIT-TENOFAF 150-150-200-10 MG PO TABS: 1.0000 | ORAL_TABLET | Freq: Every day | ORAL | Status: DC

## 2015-09-22 MED ORDER — DARUNAVIR ETHANOLATE 800 MG PO TABS: ORAL_TABLET | ORAL | Status: DC

## 2015-09-22 NOTE — Telephone Encounter (Signed)
Patient in for RW/ADAP due to medicaid issues (family and children -> family planning only).  Patient asked to speak with a nurse regarding 9 days of sinus pain, drainage, and pressure.  Per patient, no fever.   Per Dr. Lucianne Lei dam, patient should continue with OTC decongestant, ibuprofen, nasal saline spray, humidifier/steamy showers.  If the symptoms last beyond 3 weeks or if she develops a fever, patient is instructed to call the office. Pt verbalized understanding. Landis Gandy, RN

## 2015-09-26 IMAGING — CR DG CHEST 2V
2 series · 2 of 2 positions shown · non-contrast
Comparison: 05/13/2014

CLINICAL DATA: History of lung abscess 2 years ago

EXAM:
CHEST  2 VIEW

[w chest pa]
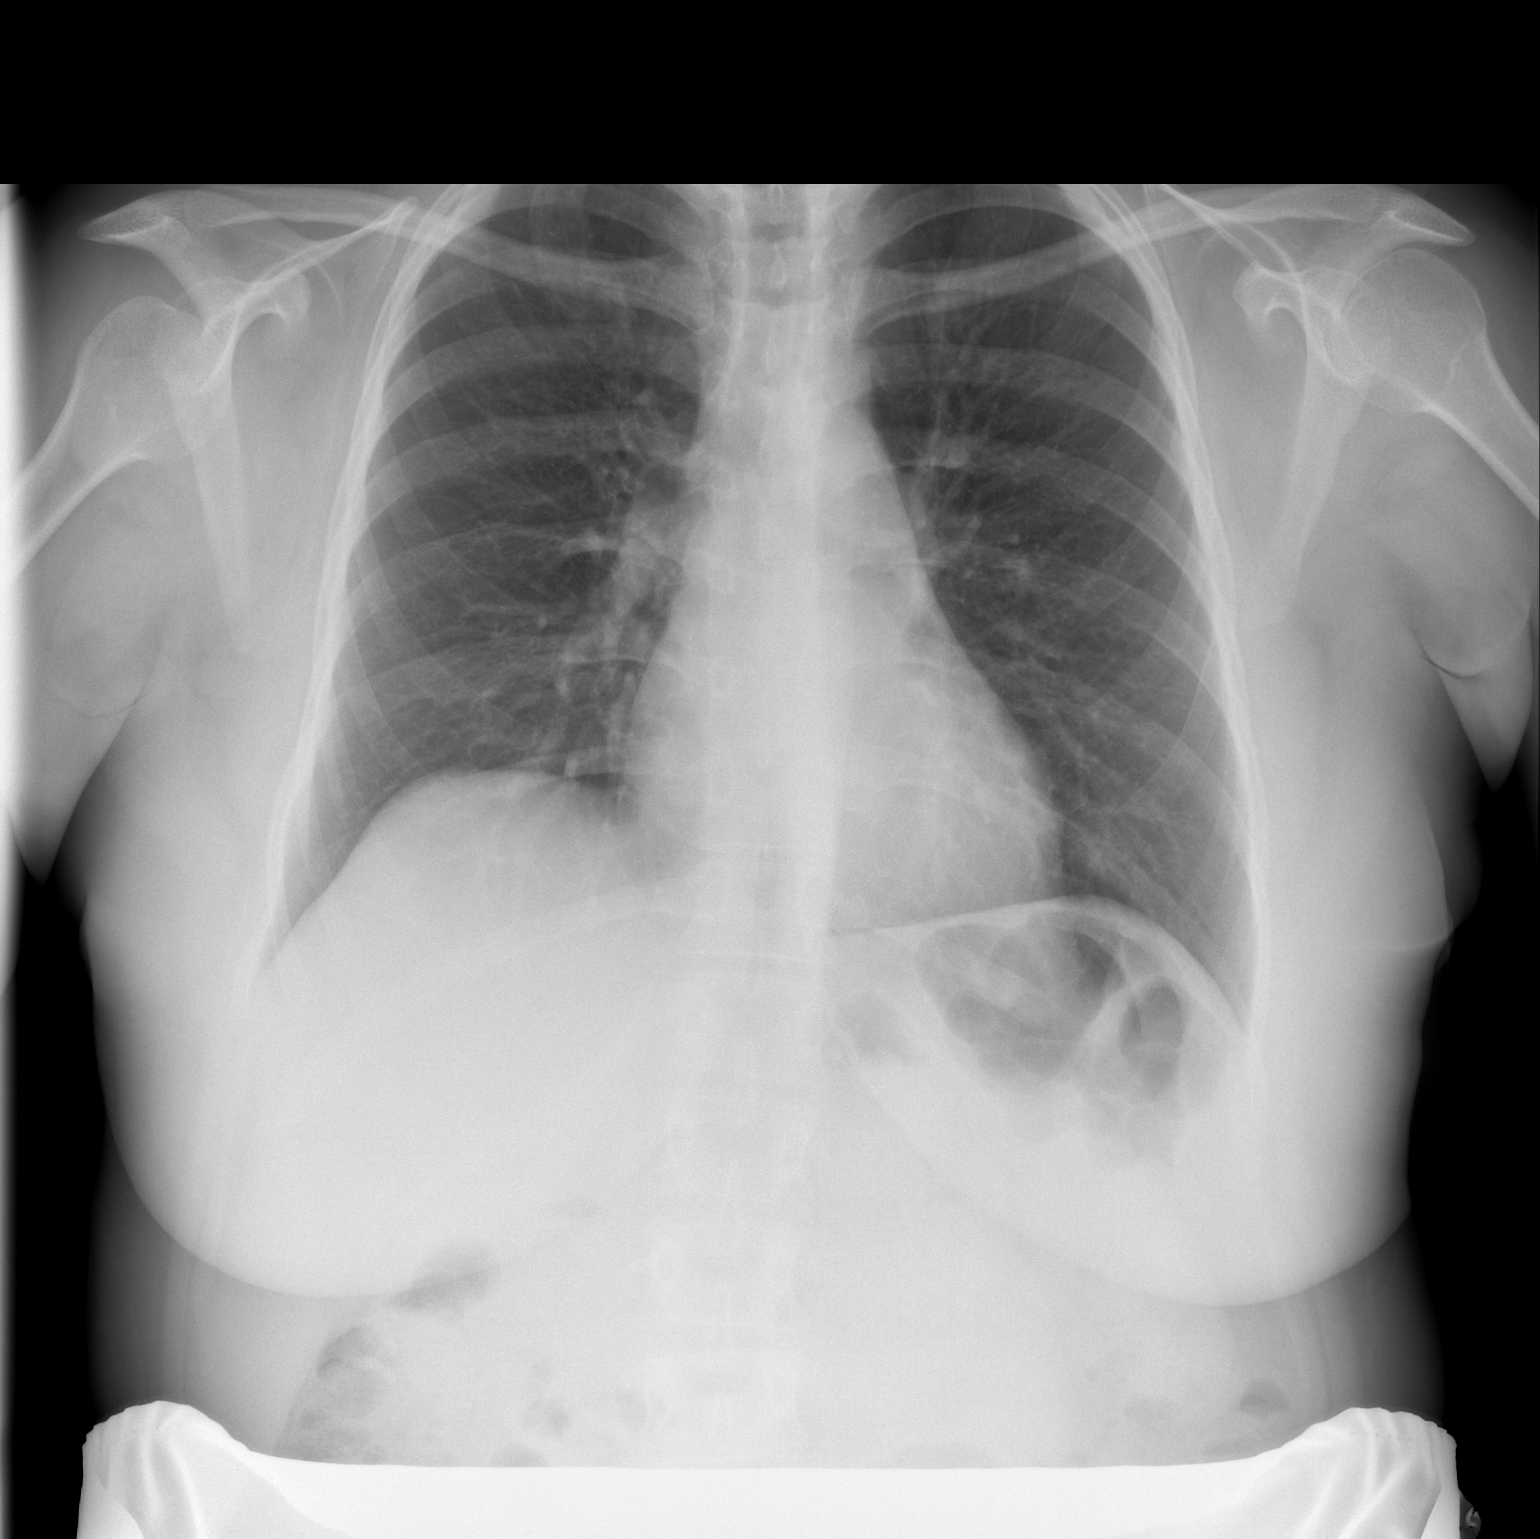

[w chest lat]
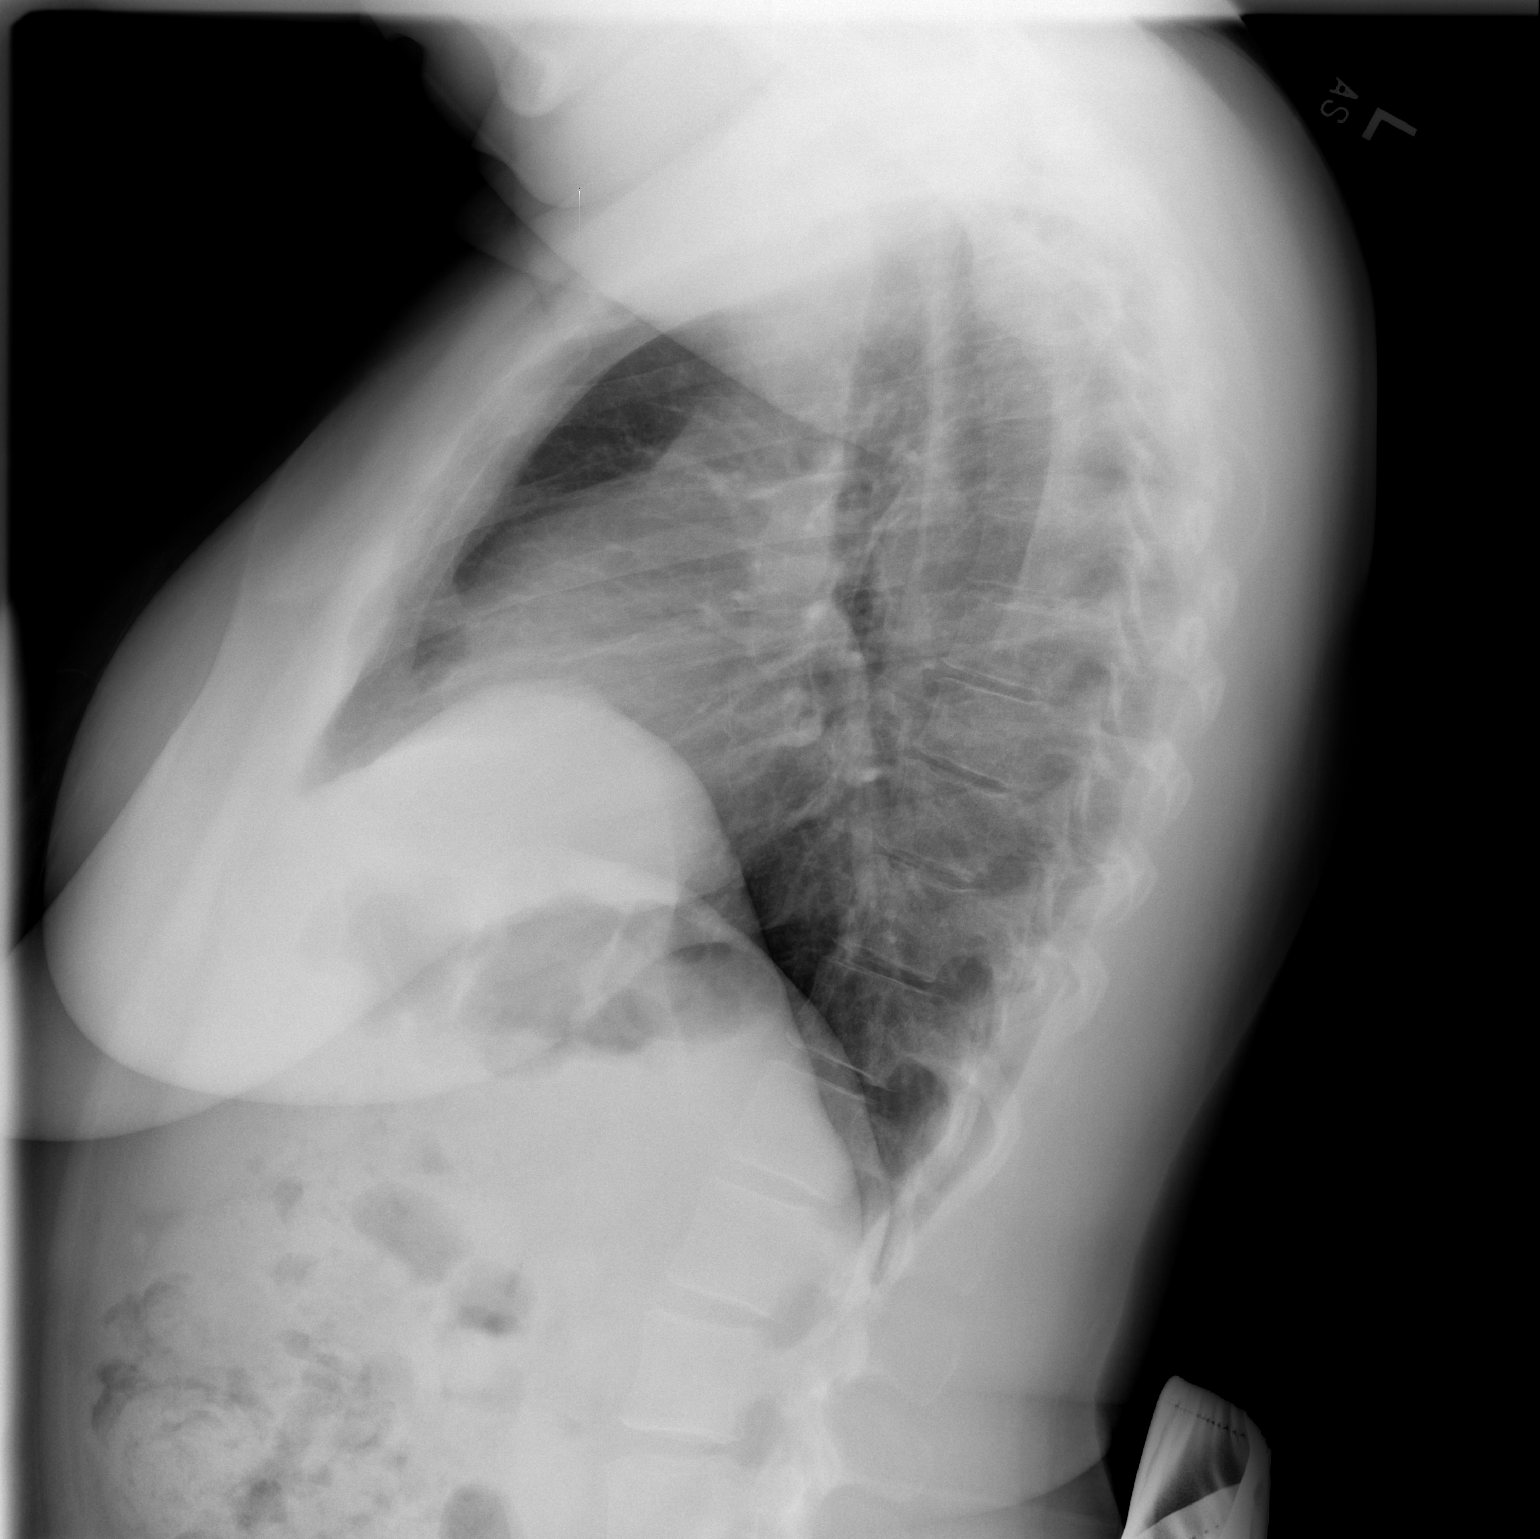

[2 of 2 positions shown; findings below may reference images not displayed]

FINDINGS: Cardiac shadow is within normal limits. The lungs are well aerated
bilaterally. No focal infiltrate or sizable effusion is seen. No
bony abnormality is noted.
IMPRESSION: No active cardiopulmonary disease.

## 2015-10-15 ENCOUNTER — Other Ambulatory Visit: Payer: Medicaid Other

## 2015-10-17 ENCOUNTER — Telehealth: Payer: Self-pay | Admitting: *Deleted

## 2015-10-17 NOTE — Telephone Encounter (Signed)
Patient called and advised she is still having sinus pain and pressure and wants something called in. She wants a Z-Pac. Advised she has been taking Allegra and no relief. Advised her will let her doctor know and call her back. Facial pain, pressure and headaches.

## 2015-10-20 ENCOUNTER — Emergency Department (HOSPITAL_COMMUNITY)
Admission: EM | Admit: 2015-10-20 | Discharge: 2015-10-20 | Disposition: A | Discharge status: Home / Self Care | Payer: Medicaid Other | Source: Home / Self Care | Attending: Family Medicine | Admitting: Family Medicine

## 2015-10-20 ENCOUNTER — Encounter (HOSPITAL_COMMUNITY): Payer: Self-pay | Admitting: Emergency Medicine

## 2015-10-20 ENCOUNTER — Other Ambulatory Visit: Payer: Self-pay | Admitting: Internal Medicine

## 2015-10-20 DIAGNOSIS — J0100 Acute maxillary sinusitis, unspecified: Secondary | ICD-10-CM | POA: Diagnosis not present

## 2015-10-20 MED ORDER — LEVOFLOXACIN 500 MG PO TABS: 500.0000 mg | ORAL_TABLET | Freq: Every day | ORAL | Status: DC

## 2015-10-20 NOTE — Telephone Encounter (Signed)
Patient will go to Urgent Care today.  Her PCP cannot see her until Thursday. Landis Gandy, RN

## 2015-10-20 NOTE — ED Notes (Signed)
Pt here with sinus infection that started 3 weeks ago Post nasal drainage, facial pressure radiating to ears and throat Instructed to come pere per PCP due to no appts. Taking Allegra-D and Alka-Seltzer

## 2015-10-20 NOTE — ED Provider Notes (Signed)
CSN: [id number]     Arrival date & time 10/20/15  1333 History   First MD Initiated Contact with Patient 10/20/15 1618     Chief Complaint  Patient presents with  . Sinus Problem   (Consider location/radiation/quality/duration/timing/severity/associated sxs/prior Treatment) HPI History obtained from patient:  Pt presents with the cc of: sinus pressure/drainage Symptoms started: 3 weeks ago Symptoms get better with: ret, steam No previous symptoms of this nature. Pain score: 3 Other symptoms include: post nasal drainage  Past Medical History  Diagnosis Date  . HIV infection ([place])   . Anxiety   . Depression   . Asthma   . GERD (gastroesophageal reflux disease)   . 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 Human immunodeficiency virus (HIV) disease ([place]) 12/07/2012  . Herpes genitalis in women 12/07/2012  . Bipolar disorder, unspecified ([place]) 12/07/2012  . Anxiety state, unspecified 12/07/2012  . Unspecified asthma(493.90) 12/07/2012  . GERD (gastroesophageal reflux disease) 12/07/2012  . Anemia   . Arthritis   . Allergy   . Headache(784.0)     migraines  . Diabetes mellitus without complication ([place])     no meds currently- controlled with diet  . Type II or unspecified type diabetes mellitus without mention of complication, not stated as uncontrolled 12/07/2012   Past Surgical History  Procedure Laterality Date  . Cesarean section    . Robotic assisted bilateral salpingo oopherectomy Right 12/04/2012    Procedure: ROBOTIC ASSISTED Right  SALPINGO OOPHORECTOMY;  Surgeon: [name], MD;  Location: [facility];  Service: Gynecology;  Laterality: Right;  . Laparotomy N/A 12/04/2012    Procedure: EXPLORATORY LAPAROTOMY;  Surgeon: [name], MD;  Location: [facility];  Service: Gynecology;  Laterality: N/A;  repair of serosal injury  . Laparoscopic lysis of adhesions N/A 12/04/2012    Procedure: LAPAROSCOPIC LYSIS OF ADHESIONS;  Surgeon: [name], MD;  Location: [facility];  Service: Gynecology;  Laterality: N/A;  . Colon surgery      colon repair after salpingo  . Laparoscopic hepatectomy N/A 06/28/2013    Procedure: LAPAROSCOPIC EXCISION HEPATIC MASS;  Surgeon: Stark Klein, MD;  Location: [facility];  Service: General;  Laterality: N/A;  . Ventral hernia repair N/A 11/06/2014    Procedure: LAPAROSCOPIC ASSITED INCARCERATED VENTRAL HERNIA REPAIR WITH MESH;  Surgeon: Stark Klein, MD;  Location[facility];  Service: General;  Laterality: N/A;  . Insertion of mesh N/A 11/06/2014    Procedure: INSERTION OF MESH;  Surgeon: Stark Klein, MD;  Location[facility];  Service: General;  Laterality: N/A;   Family History  Problem Relation Age of Onset  . Arthritis/Rheumatoid Mother   . Hypertension Mother   . Cancer Maternal Aunt     brand and lung  . Cancer Maternal Grandmother     breasts  . Cancer Paternal Grandmother     breast and kidney   Social History  Substance Use Topics  . Smoking status: Never Smoker   . Smokeless tobacco: Never Used  . Alcohol Use: 0.0 oz/week    0 Standard drinks or equivalent per week     Comment: OCC.   OB History    Gravida Para Term Preterm AB TAB SAB Ectopic Multiple Living   _0 0 _1 0 0 1     Review of Systems ROS +'ve sinus pressure/drainage  Denies: HEADACHE, NAUSEA, ABDOMINAL PAIN, CHEST PAIN, CONGESTION, DYSURIA, SHORTNESS OF BREATH  Allergies  Benzoin; Latex; and Sulfamethoxazole-trimethoprim  Home Medications   Prior to Admission medications   Medication Sig Start Date End Date Taking? Authorizing Provider  albuterol (PROVENTIL HFA;VENTOLIN HFA) 108 (90 BASE) MCG/ACT inhaler Inhale 2 puffs into the lungs every 6 (six) hours as needed for wheezing.    Historical Provider, MD  amitriptyline (ELAVIL) 50 MG tablet Take 1 tablet (50 mg total) by mouth at bedtime. 03/05/15   Jerene Bears, MD  beclomethasone (QVAR) 40 MCG/ACT inhaler Inhale 2 puffs into the lungs 2 (two) times  daily.    Historical Provider, MD  cetirizine (ZYRTEC) 10 MG tablet Take 10 mg by mouth daily.    Historical Provider, MD  Darunavir Ethanolate (PREZISTA) 800 MG tablet TAKE 1 TABLET (800 MG TOTAL) BY MOUTH DAILY. 09/22/15   Truman Hayward, MD  elvitegravir-cobicistat-emtricitabine-tenofovir [facility] [place]) 150-150-200-10 MG TABS tablet Take 1 tablet by mouth daily with breakfast. 09/22/15   Truman Hayward, MD  fexofenadine-pseudoephedrine (ALLEGRA-D 24) 180-240 MG per 24 hr tablet Take 1 tablet by mouth daily.    Historical Provider, MD  flunisolide (NASALIDE) 25 MCG/ACT (0.025%) SOLN PLACE TWO   SPRAYS INTO EACH NOSTRIL AT BEDTIME. 02/28/15   Truman Hayward, MD  Fluticasone Furoate (ARNUITY ELLIPTA) 200 MCG/ACT AEPB Inhale into the lungs.    Historical Provider, MD  ibuprofen (ADVIL,MOTRIN) 800 MG tablet Take 1 tablet (800 mg total) by mouth 3 (three) times daily with meals. 05/13/14   Harvie Heck, PA-C  levofloxacin (LEVAQUIN) 500 MG tablet Take 1 tablet (500 mg total) by mouth daily. 10/20/15   Konrad Felix, PA  levofloxacin (LEVAQUIN) 500 MG tablet Take 1 tablet (500 mg total) by mouth daily. 10/20/15   Konrad Felix, PA  lubiprostone (AMITIZA) 8 MCG capsule Take 1 capsule (8 mcg total) by mouth 2 (two) times daily with a meal. 03/05/15   Jerene Bears, MD  mometasone (NASONEX) 50 MCG/ACT nasal spray Place 2 sprays into the nose daily. 01/08/15   Truman Hayward, MD  montelukast (SINGULAIR) 10 MG tablet Take 10 mg by mouth at bedtime.    Historical Provider, MD  omeprazole (PRILOSEC) 40 MG capsule TAKE 1 CAPSULE (40 MG TOTAL) BY MOUTH DAILY. 10/22/14   Jerene Bears, MD  valACYclovir (VALTREX) 500 MG tablet TAKE 1 TABLET (500 MG TOTAL) BY MOUTH DAILY. 03/31/15   Campbell Riches, MD   Meds Ordered and Administered this Visit  Medications - No data to display  BP 135/88 mmHg  Pulse 81  Temp(Src) 98.3 F (36.8 C) (Oral)  SpO2 98% No data found.   Physical Exam NURSES NOTES AND  VITAL SIGNS REVIEWED. CONSTITUTIONAL: Well developed, well nourished, no acute distress HEENT: normocephalic, atraumatic, right and left TM's are normal, maxillary sinuses tender to palpation, with PND.  EYES: Conjunctiva normal NECK:normal ROM, supple, no adenopathy PULMONARY:No respiratory distress, normal effort, Lungs: CTAb/l, no wheezes, or increased work of breathing CARDIOVASCULAR: RRR, no murmur ABDOMEN: soft, ND, NT, +'ve BS MUSCULOSKELETAL: Normal ROM of all extremities,  SKIN: warm and dry without rash PSYCHIATRIC: Mood and affect, behavior are normal  ED Course  Procedures (including critical care time)  Labs Review Labs Reviewed - No data to display  Imaging Review No results found.   Visual Acuity Review  Right Eye Distance:   Left Eye Distance:   Bilateral Distance:    Right Eye Near:   Left Eye Near:    Bilateral Near:        RX levaquin MDM  1. Acute maxillary sinusitis, recurrence not specified    Patient is reassured that there are no issues that require transfer to higher level of care at this time.  Patient is advised to continue home symptomatic treatment. Prescription is sent to  pharmacy patient has indicated.  Patient is advised that if there are new or worsening symptoms or attend the emergency department, or contact primary care provider. Instructions of care provided discharged home in stable condition. Return to work/school note provided.  THIS NOTE WAS GENERATED USING A VOICE RECOGNITION SOFTWARE PROGRAM. ALL REASONABLE EFFORTS  WERE MADE TO PROOFREAD THIS DOCUMENT FOR ACCURACY.     Konrad Felix, [place] 10/20/15 [phone]

## 2015-10-20 NOTE — Discharge Instructions (Signed)
Sinusitis, Adult °Sinusitis is redness, soreness, and puffiness (inflammation) of the air pockets in the bones of your face (sinuses). The redness, soreness, and puffiness can cause air and mucus to get trapped in your sinuses. This can allow germs to grow and cause an infection.  °HOME CARE  °· Drink enough fluids to keep your pee (urine) clear or pale yellow. °· Use a humidifier in your home. °· Run a hot shower to create steam in the bathroom. Sit in the bathroom with the door closed. Breathe in the steam 3-4 times a day. °· Put a warm, moist washcloth on your face 3-4 times a day, or as told by your doctor. °· Use salt water sprays (saline sprays) to wet the thick fluid in your nose. This can help the sinuses drain. °· Only take medicine as told by your doctor. °GET HELP RIGHT AWAY IF:  °· Your pain gets worse. °· You have very bad headaches. °· You are sick to your stomach (nauseous). °· You throw up (vomit). °· You are very sleepy (drowsy) all the time. °· Your face is puffy (swollen). °· Your vision changes. °· You have a stiff neck. °· You have trouble breathing. °MAKE SURE YOU:  °· Understand these instructions. °· Will watch your condition. °· Will get help right away if you are not doing well or get worse. °  °This information is not intended to replace advice given to you by your health care provider. Make sure you discuss any questions you have with your health care provider. °  °Document Released: 01/19/2008 Document Revised: 08/23/2014 Document Reviewed: 03/07/2012 °Elsevier Interactive Patient Education ©2016 Elsevier Inc. ° °Sinus Rinse °WHAT IS A SINUS RINSE? °A sinus rinse is a home treatment. It rinses your sinuses with a mixture of salt and water (saline solution). Sinuses are air-filled spaces in your skull behind the bones of your face and forehead. They open into your nasal cavity. °To do a sinus rinse, you will need: °· Saline solution. °· Neti pot or spray bottle. This releases the saline  solution into your nose and through your sinuses. You can buy neti pots and spray bottles at: °¨ Your local pharmacy. °¨ A health food store. °¨ Online. °WHEN WOULD I DO A SINUS RINSE?  °A sinus rinse can help to clear your nasal cavity. It can clear:  °· Mucus. °· Dirt. °· Dust. °· Pollen. °You may do a sinus rinse when you have: °· A cold. °· A virus. °· Allergies. °· A sinus infection. °· A stuffy nose. °If you are considering a sinus rinse: °· Ask your child's doctor before doing a sinus rinse on your child. °· Do not do a sinus rinse if you have had: °¨ Ear or nasal surgery. °¨ An ear infection. °¨ Blocked ears. °HOW DO I DO A SINUS RINSE?  °· Wash your hands. °· Disinfect your device using the directions that came with the device. °· Dry your device. °· Use the solution that comes with your device or one that is sold separately in stores. Follow the mixing directions on the package. °· Fill your device with the amount of saline solution as stated in the device instructions. °· Stand over a sink and tilt your head sideways over the sink. °· Place the spout of the device in your upper nostril (the one closer to the ceiling). °· Gently pour or squeeze the saline solution into the nasal cavity. The liquid should drain to the lower nostril if you   are not too congested. °· Gently blow your nose. Blowing too hard may cause ear pain. °· Repeat in the other nostril. °· Clean and rinse your device with clean water. °· Air-dry your device. °ARE THERE RISKS OF A SINUS RINSE?  °Sinus rinse is normally very safe and helpful. However, there are a few risks, which include:  °· A burning feeling in the sinuses. This may happen if you do not make the saline solution as instructed. Make sure to follow all directions when making the saline solution. °· Infection from unclean water. This is rare, but possible. °· Nasal irritation. °  °This information is not intended to replace advice given to you by your health care provider.  Make sure you discuss any questions you have with your health care provider. °  °Document Released: 02/27/2014 Document Reviewed: 02/27/2014 °Elsevier Interactive Patient Education ©2016 Elsevier Inc. ° °

## 2015-11-05 ENCOUNTER — Other Ambulatory Visit (HOSPITAL_COMMUNITY)
Admission: RE | Admit: 2015-11-05 | Discharge: 2015-11-05 | Disposition: A | Discharge status: Home / Self Care | Payer: Medicaid Other | Source: Ambulatory Visit | Attending: Infectious Diseases | Admitting: Infectious Diseases

## 2015-11-05 ENCOUNTER — Other Ambulatory Visit: Payer: Medicaid Other

## 2015-11-05 DIAGNOSIS — Z79899 Other long term (current) drug therapy: Secondary | ICD-10-CM

## 2015-11-05 DIAGNOSIS — Z113 Encounter for screening for infections with a predominantly sexual mode of transmission: Secondary | ICD-10-CM

## 2015-11-05 DIAGNOSIS — B20 Human immunodeficiency virus [HIV] disease: Secondary | ICD-10-CM

## 2015-11-05 LAB — CBC
HEMATOCRIT: 40.2 % (ref 36.0–46.0)
Hemoglobin: 13.6 g/dL (ref 12.0–15.0)
MCH: 32.2 pg (ref 26.0–34.0)
MCHC: 33.8 g/dL (ref 30.0–36.0)
MCV: 95 fL (ref 78.0–100.0)
MPV: 9.6 fL (ref 8.6–12.4)
Platelets: 343 10*3/uL (ref 150–400)
RBC: 4.23 MIL/uL (ref 3.87–5.11)
RDW: 13.2 % (ref 11.5–15.5)
WBC: 9.9 10*3/uL (ref 4.0–10.5)

## 2015-11-05 LAB — COMPLETE METABOLIC PANEL WITH GFR
ALBUMIN: 4.1 g/dL (ref 3.6–5.1)
ALK PHOS: 77 U/L (ref 33–115)
ALT: 12 U/L (ref 6–29)
AST: 15 U/L (ref 10–30)
BILIRUBIN TOTAL: 0.4 mg/dL (ref 0.2–1.2)
BUN: 17 mg/dL (ref 7–25)
CO2: 25 mmol/L (ref 20–31)
CREATININE: 1.12 mg/dL — AB (ref 0.50–1.10)
Calcium: 9.3 mg/dL (ref 8.6–10.2)
Chloride: 104 mmol/L (ref 98–110)
GFR, Est African American: 74 mL/min (ref >=60)
GFR, Est Non African American: 64 mL/min (ref >=60)
GLUCOSE: 88 mg/dL (ref 65–99)
Potassium: 4.8 mmol/L (ref 3.5–5.3)
SODIUM: 140 mmol/L (ref 135–146)
TOTAL PROTEIN: 6.8 g/dL (ref 6.1–8.1)

## 2015-11-05 LAB — LIPID PANEL
Cholesterol: 252 mg/dL — ABNORMAL HIGH (ref 125–200)
HDL: 40 mg/dL — AB (ref >=46)
LDL Cholesterol: 188 mg/dL — ABNORMAL HIGH (ref <130)
TRIGLYCERIDES: 122 mg/dL (ref <150)
Total CHOL/HDL Ratio: 6.3 Ratio — ABNORMAL HIGH (ref <=5.0)
VLDL: 24 mg/dL (ref <30)

## 2015-11-05 NOTE — Addendum Note (Signed)
Addended byMeriel Pica F on: 11/05/2015 03:05 PM   Modules accepted: Orders

## 2015-11-06 LAB — URINE CYTOLOGY ANCILLARY ONLY
Chlamydia: NEGATIVE
Neisseria Gonorrhea: NEGATIVE

## 2015-11-06 LAB — HIV-1 RNA QUANT-NO REFLEX-BLD
HIV 1 RNA Quant: 37 copies/mL — ABNORMAL HIGH (ref <20)
HIV-1 RNA Quant, Log: 1.57 Log copies/mL — ABNORMAL HIGH (ref <1.30)

## 2015-11-06 LAB — T-HELPER CELL (CD4) - (RCID CLINIC ONLY)
CD4 % Helper T Cell: 36 % (ref 33–55)
CD4 T Cell Abs: 1520 /uL (ref 400–2700)

## 2015-11-06 LAB — RPR

## 2015-11-11 ENCOUNTER — Other Ambulatory Visit: Payer: Self-pay | Admitting: General Surgery

## 2015-11-11 DIAGNOSIS — D369 Benign neoplasm, unspecified site: Secondary | ICD-10-CM

## 2015-11-14 ENCOUNTER — Other Ambulatory Visit: Payer: Self-pay | Admitting: Infectious Diseases

## 2015-11-17 ENCOUNTER — Ambulatory Visit (INDEPENDENT_AMBULATORY_CARE_PROVIDER_SITE_OTHER): Payer: Medicaid Other | Admitting: Infectious Diseases

## 2015-11-17 ENCOUNTER — Encounter: Payer: Self-pay | Admitting: Infectious Diseases

## 2015-11-17 VITALS — BP 118/81 | HR 64 | Temp 97.7°F | Wt 192.0 lb

## 2015-11-17 DIAGNOSIS — B20 Human immunodeficiency virus [HIV] disease: Secondary | ICD-10-CM | POA: Diagnosis present

## 2015-11-17 DIAGNOSIS — Z79899 Other long term (current) drug therapy: Secondary | ICD-10-CM | POA: Diagnosis not present

## 2015-11-17 DIAGNOSIS — E785 Hyperlipidemia, unspecified: Secondary | ICD-10-CM

## 2015-11-17 DIAGNOSIS — N83209 Unspecified ovarian cyst, unspecified side: Secondary | ICD-10-CM

## 2015-11-17 DIAGNOSIS — IMO0002 Reserved for concepts with insufficient information to code with codable children: Secondary | ICD-10-CM

## 2015-11-17 DIAGNOSIS — R896 Abnormal cytological findings in specimens from other organs, systems and tissues: Secondary | ICD-10-CM

## 2015-11-17 MED ORDER — FLUCONAZOLE 100 MG PO TABS: 100.0000 mg | ORAL_TABLET | Freq: Every day | ORAL | Status: DC

## 2015-11-17 NOTE — Assessment & Plan Note (Signed)
For repeat CT scan this week.

## 2015-11-17 NOTE — Assessment & Plan Note (Signed)
She has f/u with Gyn, may neep cone bx?

## 2015-11-17 NOTE — Assessment & Plan Note (Signed)
Will check her lipids at f/u Encouraged diet and exercise.  seh refuses medication

## 2015-11-17 NOTE — Progress Notes (Signed)
   Subjective:    Patient ID: RYNE WILMES, female    DOB: [birth year], 35 y.o.   MRN: [id number]  HPI 35 yo F with hx of HIV+ on DRVr/TRV/DTGV. 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 States she now has small lesion on her L ovary. New CT scan to be done on 11-19-15. Is to have her hernia repair eval as well.   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   HIV 1 RNA QUANT (copies/mL)  Date Value  11/05/2015 37*  07/02/2015 <20  12/26/2014 21   CD4 T CELL ABS (/uL)  Date Value  11/05/2015 1520  07/02/2015 1200  12/26/2014 1550   genvoya-DRV is going well except for medicare problems. Missed 2 weeks in January.   Would like to lose wt- has been exercising when her stomach doesn't hurt too bad. Has implantable birth control.  Has dental appt tomorrow.   Review of Systems  Constitutional: Negative for appetite change and unexpected weight change.  Gastrointestinal: Negative for diarrhea and constipation.  Genitourinary: Negative for difficulty urinating and menstrual problem.  Neurological: Positive for headaches.       Objective:   Physical Exam  Constitutional: She appears well-developed and well-nourished.  HENT:  Mouth/Throat: No oropharyngeal exudate.  Eyes: EOM are normal. Pupils are equal, round, and reactive to light.  Neck: Neck supple.  Cardiovascular: Normal rate, regular rhythm and normal heart sounds.   Pulmonary/Chest: Effort normal and breath sounds normal.  Abdominal: Soft. Bowel sounds are normal. There is no tenderness. There is no rebound.  Musculoskeletal: She exhibits no edema.  Lymphadenopathy:    She has no cervical adenopathy.       Assessment & Plan:

## 2015-11-17 NOTE — Assessment & Plan Note (Signed)
Offered/refused condoms.  Will give her rx for diflucan prn Doing very well, encouraged her to call if problems with her ART, pharamacy.  Wants new PCP.  rtc in 6 months.

## 2015-11-19 ENCOUNTER — Ambulatory Visit
Admission: RE | Admit: 2015-11-19 | Discharge: 2015-11-19 | Disposition: A | Discharge status: Home / Self Care | Payer: Medicaid Other | Source: Ambulatory Visit | Attending: General Surgery | Admitting: General Surgery

## 2015-11-19 DIAGNOSIS — D369 Benign neoplasm, unspecified site: Secondary | ICD-10-CM

## 2015-11-19 MED ORDER — IOPAMIDOL (ISOVUE-300) INJECTION 61%
100.0000 mL | Freq: Once | INTRAVENOUS | Status: AC | PRN
  Administered 2015-11-19: 100 mL via INTRAVENOUS

## 2015-11-20 ENCOUNTER — Encounter: Payer: Self-pay | Admitting: Infectious Diseases

## 2015-11-26 NOTE — Progress Notes (Signed)
Quick Note:  Please let patient know that there is not any lesion at the site of the pain. The patient does have significant constipation. ______

## 2015-12-17 ENCOUNTER — Telehealth: Payer: Self-pay

## 2015-12-17 NOTE — Telephone Encounter (Signed)
Patient advised to go to local urgent care for assessment today since she is not able to see her primary care physician.    Laverle Patter, RN

## 2015-12-17 NOTE — Telephone Encounter (Signed)
Having headache for two days. Started in the top middle of her head. Not like her usual migraine because she has nausea present.

## 2015-12-22 ENCOUNTER — Encounter (HOSPITAL_COMMUNITY): Payer: Self-pay | Admitting: *Deleted

## 2015-12-22 ENCOUNTER — Ambulatory Visit (HOSPITAL_COMMUNITY)
Admission: EM | Admit: 2015-12-22 | Discharge: 2015-12-22 | Disposition: A | Discharge status: Home / Self Care | Payer: Medicaid Other | Attending: Family Medicine | Admitting: Family Medicine

## 2015-12-22 DIAGNOSIS — G43009 Migraine without aura, not intractable, without status migrainosus: Secondary | ICD-10-CM

## 2015-12-22 DIAGNOSIS — M546 Pain in thoracic spine: Secondary | ICD-10-CM

## 2015-12-22 DIAGNOSIS — G44209 Tension-type headache, unspecified, not intractable: Secondary | ICD-10-CM | POA: Diagnosis not present

## 2015-12-22 MED ORDER — CYCLOBENZAPRINE HCL 10 MG PO TABS: 10.0000 mg | ORAL_TABLET | Freq: Two times a day (BID) | ORAL | Status: DC | PRN

## 2015-12-22 MED ORDER — KETOROLAC TROMETHAMINE 60 MG/2ML IM SOLN
INTRAMUSCULAR | Status: AC
  Filled 2015-12-22: qty 2

## 2015-12-22 MED ORDER — METHYLPREDNISOLONE 4 MG PO TBPK: ORAL_TABLET | ORAL | Status: DC

## 2015-12-22 MED ORDER — KETOROLAC TROMETHAMINE 60 MG/2ML IM SOLN
60.0000 mg | Freq: Once | INTRAMUSCULAR | Status: AC
  Administered 2015-12-22: 60 mg via INTRAMUSCULAR

## 2015-12-22 NOTE — ED Notes (Signed)
Pt  Reports      Symptoms  X  2-3  Weeks  Of  Headache  And  Back  Pain   -  Pt  Reports    Worse  Over  The  Last  3  Days     pt  Sitting upright on the  Exam table  Speaking  In   Complete  sentances

## 2015-12-22 NOTE — ED Provider Notes (Signed)
CSN: [id number]     Arrival date & time 12/22/15  1300 History   None    Chief Complaint  Patient presents with  . Headache   (Consider location/radiation/quality/duration/timing/severity/associated sxs/prior Treatment) Patient is a 35 y.o. female presenting with headaches. The history is provided by the patient.  Headache Pain location:  Occipital Quality:  Dull Radiates to:  Does not radiate Severity currently:  5/10 Severity at highest:  8/10 Onset quality:  Sudden Duration:  1 day Timing:  Constant Progression:  Waxing and waning Chronicity:  New Relieved by:  None tried Worsened by:  Activity Ineffective treatments:  None tried Associated symptoms: back pain     Past Medical History  Diagnosis Date  . HIV infection ([place])   . Anxiety   . Depression   . Asthma   . GERD (gastroesophageal reflux disease)   . Neuromuscular disorder ([place])     carpal tunnel in both arms  . Human immunodeficiency virus (HIV) disease ([place]) 12/07/2012  . Herpes genitalis in women 12/07/2012  . Bipolar disorder, unspecified ([place]) 12/07/2012  . Anxiety state, unspecified 12/07/2012  . Unspecified asthma(493.90) 12/07/2012  . GERD (gastroesophageal reflux disease) 12/07/2012  . Anemia   . Arthritis   . Allergy   . Headache(784.0)     migraines  . Diabetes mellitus without complication ([place])     no meds currently- controlled with diet  . Type II or unspecified type diabetes mellitus without mention of complication, not stated as uncontrolled 12/07/2012   Past Surgical History  Procedure Laterality Date  . Cesarean section    . Robotic assisted bilateral salpingo oopherectomy Right 12/04/2012    Procedure: ROBOTIC ASSISTED Right  SALPINGO OOPHORECTOMY;  Surgeon: [name], MD;  Location: [facility];  Service: Gynecology;  Laterality: Right;  . Laparotomy N/A 12/04/2012    Procedure: EXPLORATORY LAPAROTOMY;  Surgeon: [name], MD;  Location: [facility];  Service: Gynecology;  Laterality:  N/A;  repair of serosal injury  . Laparoscopic lysis of adhesions N/A 12/04/2012    Procedure: LAPAROSCOPIC LYSIS OF ADHESIONS;  Surgeon: [name], MD;  Location: [facility];  Service: Gynecology;  Laterality: N/A;  . Colon surgery      colon repair after salpingo  . Laparoscopic hepatectomy N/A 06/28/2013    Procedure: LAPAROSCOPIC EXCISION HEPATIC MASS;  Surgeon: Stark Klein, MD;  Location: [facility];  Service: General;  Laterality: N/A;  . Ventral hernia repair N/A 11/06/2014    Procedure: LAPAROSCOPIC ASSITED INCARCERATED VENTRAL HERNIA REPAIR WITH MESH;  Surgeon: Stark Klein, MD;  Location: [facility];  Service: General;  Laterality: N/A;  . Insertion of mesh N/A 11/06/2014    Procedure: INSERTION OF MESH;  Surgeon: Stark Klein, MD;  Location: [facility];  Service: General;  Laterality: N/A;   Family History  Problem Relation Age of Onset  . Arthritis/Rheumatoid Mother   . Hypertension Mother   . Cancer Maternal Aunt     brand and lung  . Cancer Maternal Grandmother     breasts  . Cancer Paternal Grandmother     breast and kidney   Social History  Substance Use Topics  . Smoking status: Never Smoker   . Smokeless tobacco: Never Used  . Alcohol Use: 0.0 oz/week    0 Standard drinks or equivalent per week     Comment: OCC.   OB History    Gravida Para Term Preterm AB TAB SAB Ectopic Multiple Living   3 1 1  0 2 1 1  0 0 1  Review of Systems  Constitutional: Negative.   Eyes: Negative.   Respiratory: Negative.   Cardiovascular: Negative.   Gastrointestinal: Negative.   Endocrine: Negative.   Genitourinary: Negative.   Musculoskeletal: Positive for back pain.  Skin: Negative.   Neurological: Positive for headaches.  Hematological: Negative.   Psychiatric/Behavioral: Negative.     Allergies  Benzoin; Latex; and Sulfamethoxazole-trimethoprim  Home Medications   Prior to Admission medications   Medication Sig Start Date End Date Taking? Authorizing Provider   albuterol (PROVENTIL HFA;VENTOLIN HFA) 108 (90 BASE) MCG/ACT inhaler Inhale 2 puffs into the lungs every 6 (six) hours as needed for wheezing.    Historical Provider, MD  amitriptyline (ELAVIL) 50 MG tablet Take 1 tablet (50 mg total) by mouth at bedtime. 03/05/15   Jerene Bears, MD  beclomethasone (QVAR) 40 MCG/ACT inhaler Inhale 2 puffs into the lungs 2 (two) times daily.    Historical Provider, MD  cetirizine (ZYRTEC) 10 MG tablet Take 10 mg by mouth daily.    Historical Provider, MD  Darunavir Ethanolate (PREZISTA) 800 MG tablet TAKE 1 TABLET (800 MG TOTAL) BY MOUTH DAILY. 09/22/15   Truman Hayward, MD  elvitegravir-cobicistat-emtricitabine-tenofovir [facility]) 150-150-200-10 MG TABS tablet Take 1 tablet by mouth daily with breakfast. 09/22/15   Truman Hayward, MD  fexofenadine-pseudoephedrine (ALLEGRA-D 24) 180-240 MG per 24 hr tablet Take 1 tablet by mouth daily.    Historical Provider, MD  fluconazole (DIFLUCAN) 100 MG tablet Take 1 tablet (100 mg total) by mouth daily. 11/17/15   Campbell Riches, MD  flunisolide (NASALIDE) 25 MCG/ACT (0.025%) SOLN PLACE TWO   SPRAYS INTO EACH NOSTRIL AT BEDTIME. 02/28/15   Truman Hayward, MD  Fluticasone Furoate (ARNUITY ELLIPTA) 200 MCG/ACT AEPB Inhale into the lungs.    Historical Provider, MD  ibuprofen (ADVIL,MOTRIN) 800 MG tablet Take 1 tablet (800 mg total) by mouth 3 (three) times daily with meals. 05/13/14   Harvie Heck, PA-C  levofloxacin (LEVAQUIN) 500 MG tablet Take 1 tablet (500 mg total) by mouth daily. 10/20/15   Konrad Felix, PA  levofloxacin (LEVAQUIN) 500 MG tablet Take 1 tablet (500 mg total) by mouth daily. 10/20/15   Konrad Felix, PA  lubiprostone (AMITIZA) 8 MCG capsule Take 1 capsule (8 mcg total) by mouth 2 (two) times daily with a meal. 03/05/15   Jerene Bears, MD  mometasone (NASONEX) 50 MCG/ACT nasal spray Place 2 sprays into the nose daily. 01/08/15   Truman Hayward, MD  montelukast (SINGULAIR) 10 MG tablet Take 10 mg  by mouth at bedtime.    Historical Provider, MD  omeprazole (PRILOSEC) 40 MG capsule TAKE 1 CAPSULE (40 MG TOTAL) BY MOUTH DAILY. 10/22/14   Jerene Bears, MD  valACYclovir (VALTREX) 500 MG tablet TAKE 1 TABLET (500 MG TOTAL) BY MOUTH DAILY. 11/17/15   Campbell Riches, MD   Meds Ordered and Administered this Visit  Medications - No data to display  BP 131/90 mmHg  Pulse 79  Temp(Src) 97.9 F (36.6 C) (Oral)  Resp 16  SpO2 100% No data found.   Physical Exam  Constitutional: She is oriented to person, place, and time. She appears well-developed and well-nourished.  HENT:  Head: Normocephalic and atraumatic.  Right Ear: External ear normal.  Left Ear: External ear normal.  Mouth/Throat: Oropharynx is clear and moist.  Eyes: Conjunctivae and EOM are normal. Pupils are equal, round, and reactive to light.  Neck: Normal range of motion. Neck  supple.  Cardiovascular: Normal rate, regular rhythm and normal heart sounds.   Pulmonary/Chest: Effort normal and breath sounds normal.  Abdominal: Soft. Bowel sounds are normal.  Musculoskeletal:  TTP right thoracic spine paraspinous muscles and right posterior occiput.  Neurological: She is alert and oriented to person, place, and time.    ED Course  Procedures (including critical care time)  Labs Review Labs Reviewed - No data to display  Imaging Review No results found.   Visual Acuity Review  Right Eye Distance:   Left Eye Distance:   Bilateral Distance:    Right Eye Near:   Left Eye Near:    Bilateral Near:         MDM      Lysbeth Penner, FNP 12/22/15 1350

## 2015-12-22 NOTE — Discharge Instructions (Signed)
Follow up with pcp for chronic migraines.  Take day off and rest.  Take medrol dose pack 6 today 5 tomorrow and 4 next day and so on as directed.  Give Toradol 30 - 60 minutes and should help reduce headache by half.  Push po fluids rest. Back Pain, Adult Back pain is very common in adults.The cause of back pain is rarely dangerous and the pain often gets better over time.The cause of your back pain may not be known. Some common causes of back pain include:  Strain of the muscles or ligaments supporting the spine.  Wear and tear (degeneration) of the spinal disks.  Arthritis.  Direct injury to the back. For many people, back pain may return. Since back pain is rarely dangerous, most people can learn to manage this condition on their own. HOME CARE INSTRUCTIONS Watch your back pain for any changes. The following actions may help to lessen any discomfort you are feeling:  Remain active. It is stressful on your back to sit or stand in one place for long periods of time. Do not sit, drive, or stand in one place for more than 30 minutes at a time. Take short walks on even surfaces as soon as you are able.Try to increase the length of time you walk each day.  Exercise regularly as directed by your health care provider. Exercise helps your back heal faster. It also helps avoid future injury by keeping your muscles strong and flexible.  Do not stay in bed.Resting more than 1-2 days can delay your recovery.  Pay attention to your body when you bend and lift. The most comfortable positions are those that put less stress on your recovering back. Always use proper lifting techniques, including:  Bending your knees.  Keeping the load close to your body.  Avoiding twisting.  Find a comfortable position to sleep. Use a firm mattress and lie on your side with your knees slightly bent. If you lie on your back, put a pillow under your knees.  Avoid feeling anxious or stressed.Stress increases  muscle tension and can worsen back pain.It is important to recognize when you are anxious or stressed and learn ways to manage it, such as with exercise.  Take medicines only as directed by your health care provider. Over-the-counter medicines to reduce pain and inflammation are often the most helpful.Your health care provider may prescribe muscle relaxant drugs.These medicines help dull your pain so you can more quickly return to your normal activities and healthy exercise.  Apply ice to the injured area:  Put ice in a plastic bag.  Place a towel between your skin and the bag.  Leave the ice on for 20 minutes, 2-3 times a day for the first 2-3 days. After that, ice and heat may be alternated to reduce pain and spasms.  Maintain a healthy weight. Excess weight puts extra stress on your back and makes it difficult to maintain good posture. SEEK MEDICAL CARE IF:  You have pain that is not relieved with rest or medicine.  You have increasing pain going down into the legs or buttocks.  You have pain that does not improve in one week.  You have night pain.  You lose weight.  You have a fever or chills. SEEK IMMEDIATE MEDICAL CARE IF:   You develop new bowel or bladder control problems.  You have unusual weakness or numbness in your arms or legs.  You develop nausea or vomiting.  You develop abdominal pain.  You  feel faint.   This information is not intended to replace advice given to you by your health care provider. Make sure you discuss any questions you have with your health care provider.   Document Released: 08/02/2005 Document Revised: 08/23/2014 Document Reviewed: 12/04/2013 Elsevier Interactive Patient Education [employer].

## 2015-12-23 ENCOUNTER — Telehealth: Payer: Self-pay | Admitting: *Deleted

## 2015-12-23 NOTE — Telephone Encounter (Signed)
Patient calling requesting follow up for headaches, asking for a headache regimen. Patient is insured with medicaid.  RN advised patient to contact [facility] at [phone] to establish care, then to contact DSS to change her primary care provider.  Patient given phone number, will call for appointment. Landis Gandy, RN

## 2015-12-28 IMAGING — CR DG CHEST 2V
2 series · 2 of 2 positions shown · non-contrast
Comparison: 01/15/2015

CLINICAL DATA: Asthma, productive cough for 1 week

EXAM:
CHEST  2 VIEW

[w chest pa]
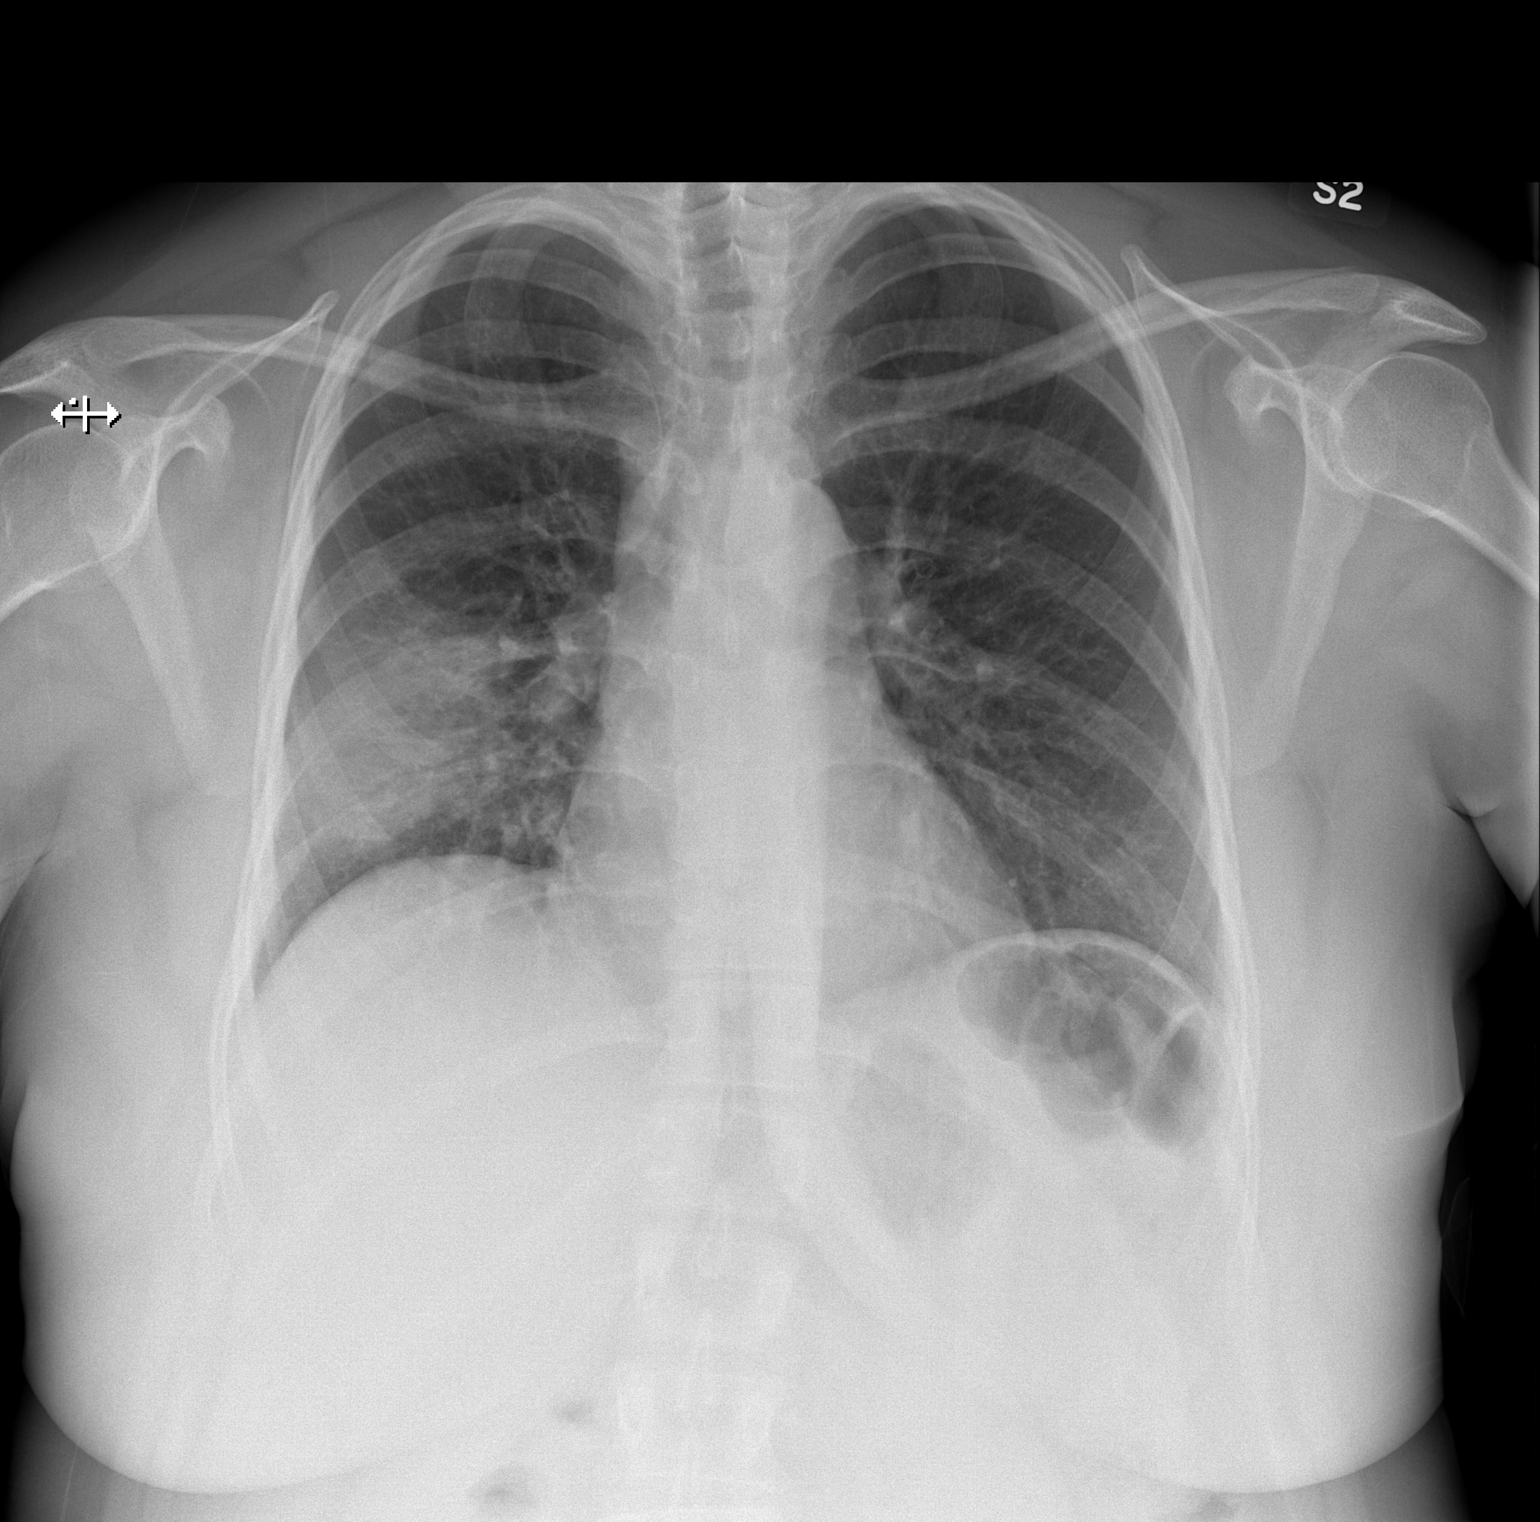

[w chest lat]
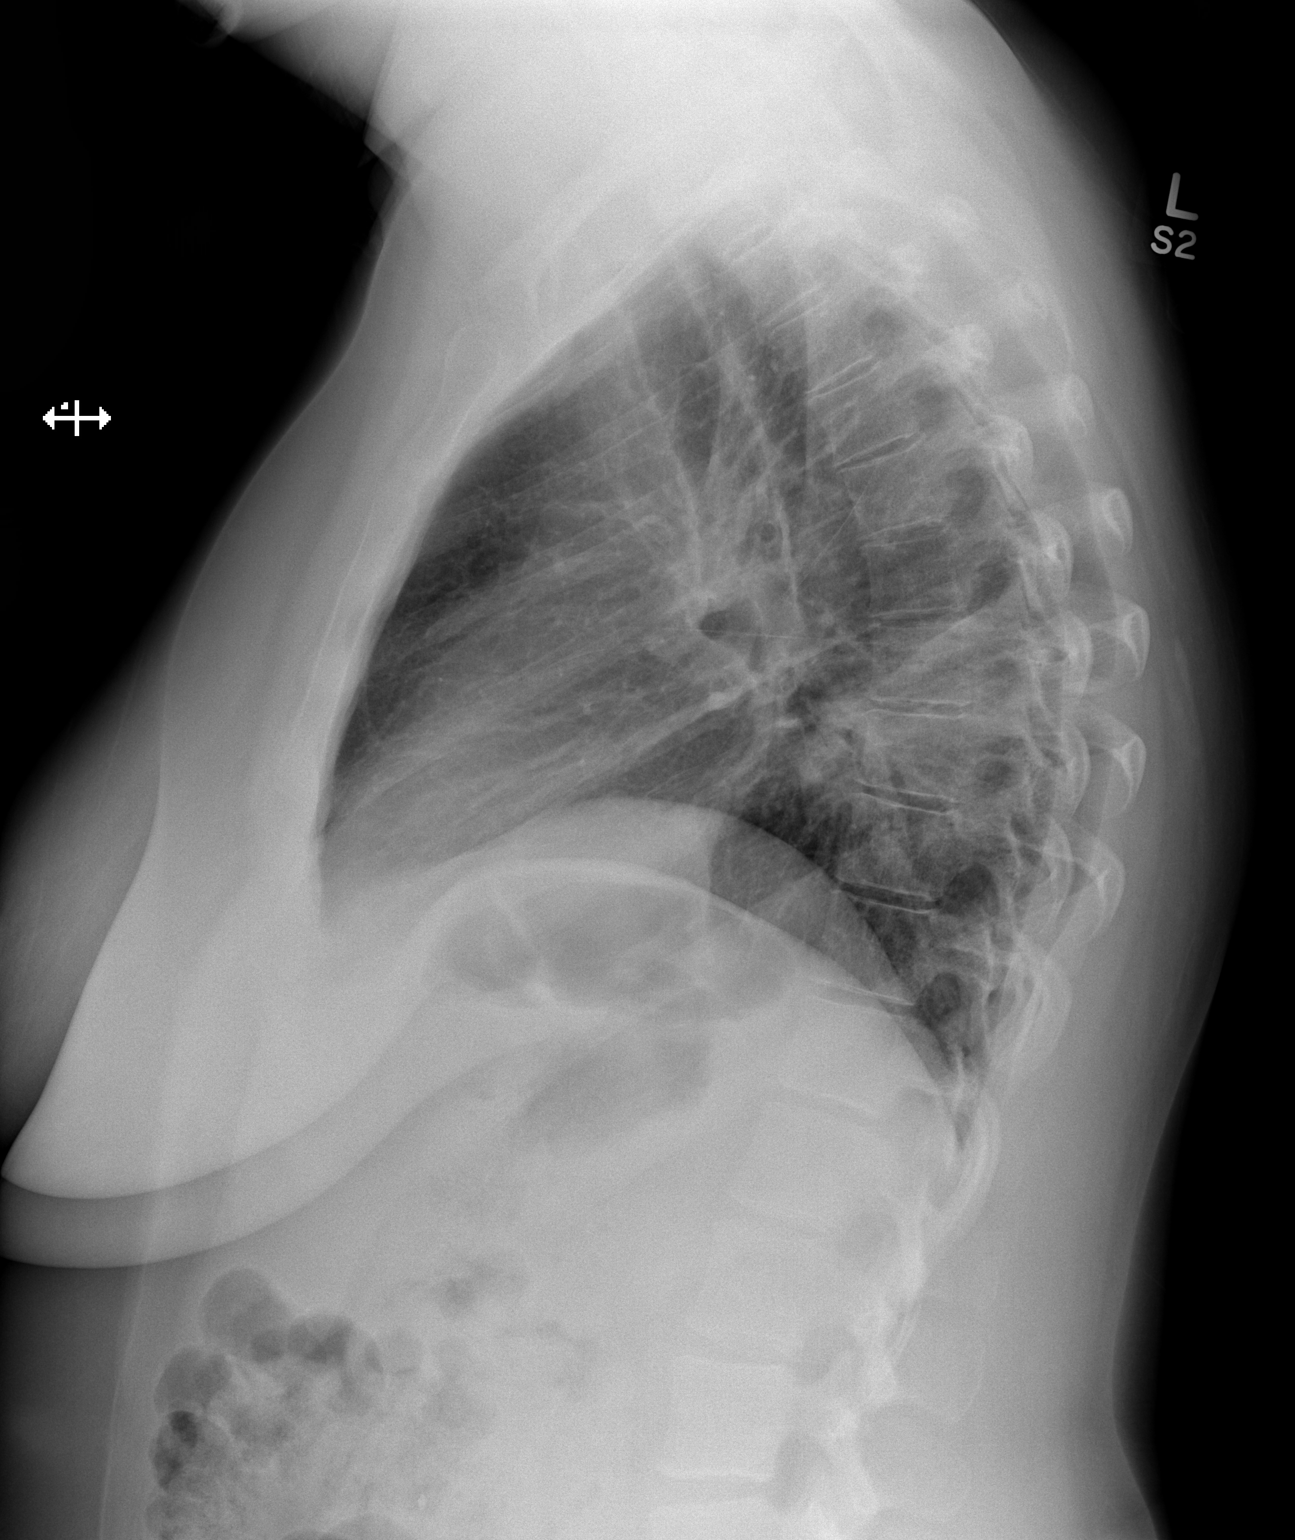

[2 of 2 positions shown; findings below may reference images not displayed]

FINDINGS: There is focal consolidation in the right lower lobe. There is no
other focal parenchymal opacity. There is no pleural effusion or
pneumothorax. The heart and mediastinal contours are unremarkable.
The osseous structures are unremarkable.
IMPRESSION: Right lower lobe pneumonia.

## 2015-12-31 ENCOUNTER — Encounter (HOSPITAL_COMMUNITY): Payer: Self-pay | Admitting: Nurse Practitioner

## 2015-12-31 ENCOUNTER — Ambulatory Visit (HOSPITAL_COMMUNITY)
Admission: EM | Admit: 2015-12-31 | Discharge: 2015-12-31 | Disposition: A | Discharge status: Home / Self Care | Payer: Medicaid Other | Attending: Emergency Medicine | Admitting: Emergency Medicine

## 2015-12-31 DIAGNOSIS — S233XXD Sprain of ligaments of thoracic spine, subsequent encounter: Secondary | ICD-10-CM | POA: Diagnosis not present

## 2015-12-31 DIAGNOSIS — S29012D Strain of muscle and tendon of back wall of thorax, subsequent encounter: Secondary | ICD-10-CM

## 2015-12-31 DIAGNOSIS — T148 Other injury of unspecified body region: Secondary | ICD-10-CM | POA: Diagnosis not present

## 2015-12-31 DIAGNOSIS — G43109 Migraine with aura, not intractable, without status migrainosus: Secondary | ICD-10-CM | POA: Diagnosis not present

## 2015-12-31 DIAGNOSIS — T148XXA Other injury of unspecified body region, initial encounter: Secondary | ICD-10-CM

## 2015-12-31 DIAGNOSIS — L700 Acne vulgaris: Secondary | ICD-10-CM

## 2015-12-31 MED ORDER — NAPROXEN 375 MG PO TABS: 375.0000 mg | ORAL_TABLET | Freq: Two times a day (BID) | ORAL | Status: DC

## 2015-12-31 MED ORDER — METHOCARBAMOL 500 MG PO TABS: 500.0000 mg | ORAL_TABLET | Freq: Two times a day (BID) | ORAL | Status: DC

## 2015-12-31 MED ORDER — KETOROLAC TROMETHAMINE 60 MG/2ML IM SOLN
INTRAMUSCULAR | Status: AC
  Filled 2015-12-31: qty 2

## 2015-12-31 MED ORDER — DEXAMETHASONE SODIUM PHOSPHATE 10 MG/ML IJ SOLN
INTRAMUSCULAR | Status: AC
  Filled 2015-12-31: qty 1

## 2015-12-31 MED ORDER — KETOROLAC TROMETHAMINE 60 MG/2ML IM SOLN
60.0000 mg | Freq: Once | INTRAMUSCULAR | Status: AC
  Administered 2015-12-31: 60 mg via INTRAMUSCULAR

## 2015-12-31 MED ORDER — ONDANSETRON 4 MG PO TBDP
ORAL_TABLET | ORAL | Status: AC
  Filled 2015-12-31: qty 1

## 2015-12-31 MED ORDER — ONDANSETRON 4 MG PO TBDP
4.0000 mg | ORAL_TABLET | Freq: Once | ORAL | Status: AC
  Administered 2015-12-31: 4 mg via ORAL

## 2015-12-31 MED ORDER — ERYTHROMYCIN 2 % EX GEL: Freq: Every day | CUTANEOUS | Status: DC

## 2015-12-31 MED ORDER — DEXAMETHASONE SODIUM PHOSPHATE 10 MG/ML IJ SOLN
10.0000 mg | Freq: Once | INTRAMUSCULAR | Status: AC
Start: 2015-12-31 — End: 2015-12-31
  Administered 2015-12-31: 10 mg via INTRAMUSCULAR

## 2015-12-31 NOTE — ED Provider Notes (Signed)
CSN: [id number]     Arrival date & time 12/31/15  1343 History   First MD Initiated Contact with Patient 12/31/15 1433     Chief Complaint  Patient presents with  . Pain   (Consider location/radiation/quality/duration/timing/severity/associated sxs/prior Treatment) HPI Comments: 35 year old female who is following up with the urgent care after being seen approximately one week ago for the same problems. She is complaining of right mid back pain. The pain is worse with certain movements such as turning after prolonged standing. Pain does not radiate. Denies focal paresthesias or weakness.  Her second complaint is that of a "migraine headache". She states she has had this for 3 days. It is not unlike her previous migraine headaches. Positive for mild photophobia but no apparent discomfort with the eye exam. The pain is located to the side of her head however it tends to move from one side to the other going back and forth. She occasionally has nausea with no vomiting. No problems with vision, speech, hearing, swallowing, focal paresthesias or weakness, bladder incontinence.  The third complaint is that of acne-like bumps that appear on her face and forehead in the past few days. They are tender.  Patient states she is currently in between PCPs and has an appointment coming up in the next one or 2 weeks.   Past Medical History  Diagnosis Date  . HIV infection ([place])   . Anxiety   . Depression   . Asthma   . GERD (gastroesophageal reflux disease)   . Neuromuscular disorder ([place])     carpal tunnel in both arms  . Human immunodeficiency virus (HIV) disease ([place]) 12/07/2012  . Herpes genitalis in women 12/07/2012  . Bipolar disorder, unspecified ([place]) 12/07/2012  . Anxiety state, unspecified 12/07/2012  . Unspecified asthma(493.90) 12/07/2012  . GERD (gastroesophageal reflux disease) 12/07/2012  . Anemia   . Arthritis   . Allergy   . Headache(784.0)     migraines  . Diabetes mellitus without  complication ([place])     no meds currently- controlled with diet  . Type II or unspecified type diabetes mellitus without mention of complication, not stated as uncontrolled 12/07/2012   Past Surgical History  Procedure Laterality Date  . Cesarean section    . Robotic assisted bilateral salpingo oopherectomy Right 12/04/2012    Procedure: ROBOTIC ASSISTED Right  SALPINGO OOPHORECTOMY;  Surgeon: [name], MD;  Location: [facility];  Service: Gynecology;  Laterality: Right;  . Laparotomy N/A 12/04/2012    Procedure: EXPLORATORY LAPAROTOMY;  Surgeon: [name], MD;  Location: [facility];  Service: Gynecology;  Laterality: N/A;  repair of serosal injury  . Laparoscopic lysis of adhesions N/A 12/04/2012    Procedure: LAPAROSCOPIC LYSIS OF ADHESIONS;  Surgeon: [name], MD;  Location: [facility];  Service: Gynecology;  Laterality: N/A;  . Colon surgery      colon repair after salpingo  . Laparoscopic hepatectomy N/A 06/28/2013    Procedure: LAPAROSCOPIC EXCISION HEPATIC MASS;  Surgeon: Stark Klein, MD;  Location: [facility];  Service: General;  Laterality: N/A;  . Ventral hernia repair N/A 11/06/2014    Procedure: LAPAROSCOPIC ASSITED INCARCERATED VENTRAL HERNIA REPAIR WITH MESH;  Surgeon: Stark Klein, MD;  Location: [facility];  Service: General;  Laterality: N/A;  . Insertion of mesh N/A 11/06/2014    Procedure: INSERTION OF MESH;  Surgeon: Stark Klein, MD;  Location: [facility];  Service: General;  Laterality: N/A;   Family History  Problem Relation Age of Onset  . Arthritis/Rheumatoid Mother   .  Hypertension Mother   . Cancer Maternal Aunt     brand and lung  . Cancer Maternal Grandmother     breasts  . Cancer Paternal Grandmother     breast and kidney   Social History  Substance Use Topics  . Smoking status: Never Smoker   . Smokeless tobacco: Never Used  . Alcohol Use: 0.0 oz/week    0 Standard drinks or equivalent per week     Comment: OCC.   OB History    Gravida Para Term  Preterm AB TAB SAB Ectopic Multiple Living   3 1 1  0 2 1 1  0 0 1     Review of Systems  Constitutional: Positive for activity change. Negative for fever and chills.  HENT: Negative for congestion, ear pain, postnasal drip and sore throat.        Facial bumps which are tender.  Eyes: Positive for photophobia. Negative for pain, discharge, itching and visual disturbance.  Respiratory: Negative.   Cardiovascular: Negative.   Genitourinary: Negative.   Musculoskeletal: Positive for myalgias and back pain. Negative for joint swelling.  Skin:       As per history of present illness  Neurological: Positive for headaches. Negative for dizziness, tremors, seizures, syncope, facial asymmetry, speech difficulty and numbness.  All other systems reviewed and are negative.   Allergies  Benzoin; Latex; and Sulfamethoxazole-trimethoprim  Home Medications   Prior to Admission medications   Medication Sig Start Date End Date Taking? Authorizing Provider  albuterol (PROVENTIL HFA;VENTOLIN HFA) 108 (90 BASE) MCG/ACT inhaler Inhale 2 puffs into the lungs every 6 (six) hours as needed for wheezing.    Historical Provider, MD  amitriptyline (ELAVIL) 50 MG tablet Take 1 tablet (50 mg total) by mouth at bedtime. 03/05/15   Jerene Bears, MD  beclomethasone (QVAR) 40 MCG/ACT inhaler Inhale 2 puffs into the lungs 2 (two) times daily.    Historical Provider, MD  cetirizine (ZYRTEC) 10 MG tablet Take 10 mg by mouth daily.    Historical Provider, MD  Darunavir Ethanolate (PREZISTA) 800 MG tablet TAKE 1 TABLET (800 MG TOTAL) BY MOUTH DAILY. 09/22/15   Truman Hayward, MD  elvitegravir-cobicistat-emtricitabine-tenofovir [facility]) 150-150-200-10 MG TABS tablet Take 1 tablet by mouth daily with breakfast. 09/22/15   Truman Hayward, MD  erythromycin with ethanol (EMGEL) 2 % gel Apply topically daily. 12/31/15   Janne Napoleon, NP  fexofenadine-pseudoephedrine (ALLEGRA-D 24) 180-240 MG per 24 hr tablet Take 1 tablet by  mouth daily.    Historical Provider, MD  fluconazole (DIFLUCAN) 100 MG tablet Take 1 tablet (100 mg total) by mouth daily. 11/17/15   Campbell Riches, MD  flunisolide (NASALIDE) 25 MCG/ACT (0.025%) SOLN PLACE TWO   SPRAYS INTO EACH NOSTRIL AT BEDTIME. 02/28/15   Truman Hayward, MD  Fluticasone Furoate (ARNUITY ELLIPTA) 200 MCG/ACT AEPB Inhale into the lungs.    Historical Provider, MD  ibuprofen (ADVIL,MOTRIN) 800 MG tablet Take 1 tablet (800 mg total) by mouth 3 (three) times daily with meals. 05/13/14   Harvie Heck, PA-C  levofloxacin (LEVAQUIN) 500 MG tablet Take 1 tablet (500 mg total) by mouth daily. 10/20/15   Konrad Felix, PA  levofloxacin (LEVAQUIN) 500 MG tablet Take 1 tablet (500 mg total) by mouth daily. 10/20/15   Konrad Felix, PA  lubiprostone (AMITIZA) 8 MCG capsule Take 1 capsule (8 mcg total) by mouth 2 (two) times daily with a meal. 03/05/15   Jerene Bears, MD  methocarbamol (ROBAXIN) 500 MG tablet Take 1 tablet (500 mg total) by mouth 2 (two) times daily. 12/31/15   Janne Napoleon, NP  methylPREDNISolone (MEDROL DOSEPAK) 4 MG TBPK tablet Take as directed 12/22/15   Lysbeth Penner, FNP  mometasone (NASONEX) 50 MCG/ACT nasal spray Place 2 sprays into the nose daily. 01/08/15   Truman Hayward, MD  montelukast (SINGULAIR) 10 MG tablet Take 10 mg by mouth at bedtime.    Historical Provider, MD  naproxen (NAPROSYN) 375 MG tablet Take 1 tablet (375 mg total) by mouth 2 (two) times daily. 12/31/15   Janne Napoleon, NP  omeprazole (PRILOSEC) 40 MG capsule TAKE 1 CAPSULE (40 MG TOTAL) BY MOUTH DAILY. 10/22/14   Jerene Bears, MD  valACYclovir (VALTREX) 500 MG tablet TAKE 1 TABLET (500 MG TOTAL) BY MOUTH DAILY. 11/17/15   Campbell Riches, MD   Meds Ordered and Administered this Visit   Medications  ketorolac (TORADOL) injection 60 mg (not administered)  dexamethasone (DECADRON) injection 10 mg (not administered)  ondansetron (ZOFRAN-ODT) disintegrating tablet 4 mg (not administered)    BP  128/79 mmHg  Pulse 76  Temp(Src) 97.9 F (36.6 C) (Oral)  Resp 17  SpO2 100% No data found.   Physical Exam  Constitutional: She is oriented to person, place, and time. She appears well-developed and well-nourished. No distress.  HENT:  Head: Normocephalic and atraumatic.  Bilateral TMs are normal, clear. Oropharynx is clear and moist. No exudates or swelling.  Eyes: Conjunctivae and EOM are normal. Pupils are equal, round, and reactive to light. Right eye exhibits no discharge. Left eye exhibits no discharge.  Neck: Normal range of motion. Neck supple.  Forward flexion of the neck reproduces pain in the right midthoracic musculature.  Cardiovascular: Normal rate, regular rhythm, normal heart sounds and intact distal pulses.   Pulmonary/Chest: Effort normal and breath sounds normal. No respiratory distress. She has no wheezes. She has no rales.  Musculoskeletal: She exhibits tenderness. She exhibits no edema.  Tenderness along the right parathoracic and lesser paralumbar musculature. Pain is reproduced with movements of the back such as leaning forward and rotating to the left. Flexion of the neck also produces pain along the medial scapular border. Extremity strength is 5 over 5 symmetric.  Lymphadenopathy:    She has no cervical adenopathy.  Neurological: She is alert and oriented to person, place, and time. She has normal strength. She displays no tremor. No cranial nerve deficit or sensory deficit. She exhibits normal muscle tone. Coordination normal.  Finger to nose intact.  Skin: Skin is warm and dry.  Psychiatric: She has a normal mood and affect.  Nursing note and vitals reviewed.   ED Course  Procedures (including critical care time)  Labs Review Labs Reviewed - No data to display  Imaging Review No results found.   Visual Acuity Review  Right Eye Distance:   Left Eye Distance:   Bilateral Distance:    Right Eye Near:   Left Eye Near:    Bilateral Near:          MDM   1. Thoracic sprain and strain, subsequent encounter   2. Muscle strain   3. Migraine with aura and without status migrainosus, not intractable   4. Acne vulgaris    Meds ordered this encounter  Medications  . ketorolac (TORADOL) injection 60 mg    Sig:   . dexamethasone (DECADRON) injection 10 mg    Sig:   . ondansetron (ZOFRAN-ODT) disintegrating tablet  4 mg    Sig:   . methocarbamol (ROBAXIN) 500 MG tablet    Sig: Take 1 tablet (500 mg total) by mouth 2 (two) times daily.    Dispense:  20 tablet    Refill:  0    Order Specific Question:  Supervising Provider    Answer:  Melony Overly [id number]  . erythromycin with ethanol (EMGEL) 2 % gel    Sig: Apply topically daily.    Dispense:  30 g    Refill:  0    Order Specific Question:  Supervising Provider    Answer:  Melony Overly [id number]  . naproxen (NAPROSYN) 375 MG tablet    Sig: Take 1 tablet (375 mg total) by mouth 2 (two) times daily.    Dispense:  20 tablet    Refill:  0    Order Specific Question:  Supervising Provider    Answer:  Melony Overly [id number]   Follow with your PCP ASAP    Janne Napoleon, NP 12/31/15 1511

## 2015-12-31 NOTE — Discharge Instructions (Signed)
Recurrent Migraine Headache A migraine headache is an intense, throbbing pain on one or both sides of your head. Recurrent migraines keep coming back. A migraine can last for 30 minutes to several hours. CAUSES  The exact cause of a migraine headache is not always known. However, a migraine may be caused when nerves in the brain become irritated and release chemicals that cause inflammation. This causes pain. Certain things may also trigger migraines, such as:   Alcohol.  Smoking.  Stress.  Menstruation.  Aged cheeses.  Foods or drinks that contain nitrates, glutamate, aspartame, or tyramine.  Lack of sleep.  Chocolate.  Caffeine.  Hunger.  Physical exertion.  Fatigue.  Medicines used to treat chest pain (nitroglycerine), birth control pills, estrogen, and some blood pressure medicines. SYMPTOMS   Pain on one or both sides of your head.  Pulsating or throbbing pain.  Severe pain that prevents daily activities.  Pain that is aggravated by any physical activity.  Nausea, vomiting, or both.  Dizziness.  Pain with exposure to bright lights, loud noises, or activity.  General sensitivity to bright lights, loud noises, or smells. Before you get a migraine, you may get warning signs that a migraine is coming (aura). An aura may include:  Seeing flashing lights.  Seeing bright spots, halos, or zigzag lines.  Having tunnel vision or blurred vision.  Having feelings of numbness or tingling.  Having trouble talking.  Having muscle weakness. DIAGNOSIS  A recurrent migraine headache is often diagnosed based on:  Symptoms.  Physical examination.  A CT scan or MRI of your head. These imaging tests cannot diagnose migraines but can help rule out other causes of headaches.  TREATMENT  Medicines may be given for pain and nausea. Medicines can also be given to help prevent recurrent migraines. HOME CARE INSTRUCTIONS  Only take over-the-counter or prescription  medicines for pain or discomfort as directed by your health care provider. The use of long-term narcotics is not recommended.  Lie down in a dark, quiet room when you have a migraine.  Keep a journal to find out what may trigger your migraine headaches. For example, write down:  What you eat and drink.  How much sleep you get.  Any change to your diet or medicines.  Limit alcohol consumption.  Quit smoking if you smoke.  Get 7-9 hours of sleep, or as recommended by your health care provider.  Limit stress.  Keep lights dim if bright lights bother you and make your migraines worse. SEEK MEDICAL CARE IF:   You do not get relief from the medicines given to you.  You have a recurrence of pain.  You have a fever. SEEK IMMEDIATE MEDICAL CARE IF:  Your migraine becomes severe.  You have a stiff neck.  You have loss of vision.  You have muscular weakness or loss of muscle control.  You start losing your balance or have trouble walking.  You feel faint or pass out.  You have severe symptoms that are different from your first symptoms. MAKE SURE YOU:   Understand these instructions.  Will watch your condition.  Will get help right away if you are not doing well or get worse.   This information is not intended to replace advice given to you by your health care provider. Make sure you discuss any questions you have with your health care provider.   Document Released: 04/27/2001 Document Revised: 08/23/2014 Document Reviewed: 04/09/2013 Elsevier Interactive Patient Education 2016 Elsevier Inc.  Acne Acne is  a skin problem that causes pimples. Acne occurs when the pores in the skin get blocked. The pores may become infected with bacteria, or they may become red, sore, and swollen. Acne is a common skin problem, especially for teenagers. Acne usually goes away over time. CAUSES Each pore contains an oil gland. Oil glands make an oily substance that is called sebum. Acne  happens when these glands get plugged with sebum, dead skin cells, and dirt. Then, the bacteria that are normally found in the oil glands multiply and cause inflammation. Acne is commonly triggered by changes in your hormones. These hormonal changes can cause the oil glands to get bigger and to make more sebum. Factors that can make acne worse include:  Hormone changes during:  Adolescence.  Women's menstrual cycles.  Pregnancy.  Oil-based cosmetics and hair products.  Harshly scrubbing the skin.  Strong soaps.  Stress.  Hormone problems that are due to certain diseases.  Long or oily hair rubbing against the skin.  Certain medicines.  Pressure from headbands, backpacks, or shoulder pads.  Exposure to certain oils and chemicals. RISK FACTORS This condition is more likely to develop in:  Teenagers.  People who have a family history of acne. SYMPTOMS Acne often occurs on the face, neck, chest, and upper back. Symptoms include:  Small, red bumps (pimples or papules).  Whiteheads.  Blackheads.  Small, pus-filled pimples (pustules).  Big, red pimples or pustules that feel tender. More severe acne can cause:  An infected area that contains a collection of pus (abscess).  Hard, painful, fluid-filled sacs (cysts).  Scars. DIAGNOSIS This condition is diagnosed with a medical history and physical exam. Blood tests may also be done. TREATMENT Treatment for this condition can vary depending on the severity of your acne. Treatment may include:  Creams and lotions that prevent oil glands from clogging.  Creams and lotions that treat or prevent infections and inflammation.  Antibiotic medicines that are applied to the skin or taken as a pill.  Pills that decrease sebum production.  Birth control pills.  Light or laser treatments.  Surgery.  Injections of medicine into the affected areas.  Chemicals that cause peeling of the skin. Your health care provider  will also recommend the best way to take care of your skin. Good skin care is the most important part of treatment. HOME CARE INSTRUCTIONS Skin Care Take care of your skin as told by your health care provider. You may be told to do these things:  Wash your skin gently at least two times each day, as well as:  After you exercise.  Before you go to bed.  Use mild soap.  Apply a water-based skin moisturizer after you wash your skin.  Use a sunscreen or sunblock with SPF 30 or greater. This is especially important if you are using acne medicines.  Choose cosmetics that will not plug your oil glands (are noncomedogenic). Medicines  Take over-the-counter and prescription medicines only as told by your health care provider.  If you were prescribed an antibiotic medicine, apply or take it as told by your health care provider. Do not stop taking the antibiotic even if your condition improves. General Instructions  Keep your hair clean and off of your face. If you have oily hair, shampoo your hair regularly or daily.  Avoid leaning your chin or forehead against your hands.  Avoid wearing tight headbands or hats.  Avoid picking or squeezing your pimples. That can make your acne worse and cause  scarring.  Keep all follow-up visits as told by your health care provider. This is important.  Shave gently and only when necessary.  Keep a food journal to figure out if any foods are linked with your acne. SEEK MEDICAL CARE IF:  Your acne is not better after eight weeks.  Your acne gets worse.  You have a large area of skin that is red or tender.  You think that you are having side effects from any acne medicine.   This information is not intended to replace advice given to you by your health care provider. Make sure you discuss any questions you have with your health care provider.   Document Released: 07/30/2000 Document Revised: 04/23/2015 Document Reviewed: 10/09/2014 Elsevier  Interactive Patient Education [employer].

## 2015-12-31 NOTE — ED Notes (Signed)
Pt c/o 1 month history of back pain, R side pain, headaches, soreness to entire body. States she was seen here for this and given muscle relaxers but pain has not improved. She is alert and breathing easily

## 2016-01-07 ENCOUNTER — Other Ambulatory Visit: Payer: Self-pay | Admitting: Infectious Disease

## 2016-01-07 ENCOUNTER — Other Ambulatory Visit: Payer: Self-pay | Admitting: Infectious Diseases

## 2016-01-08 ENCOUNTER — Other Ambulatory Visit: Payer: Self-pay | Admitting: *Deleted

## 2016-01-08 DIAGNOSIS — B20 Human immunodeficiency virus [HIV] disease: Secondary | ICD-10-CM

## 2016-01-08 DIAGNOSIS — B009 Herpesviral infection, unspecified: Secondary | ICD-10-CM

## 2016-01-08 MED ORDER — DARUNAVIR ETHANOLATE 800 MG PO TABS: ORAL_TABLET | ORAL | Status: DC

## 2016-01-08 MED ORDER — MOMETASONE FUROATE 50 MCG/ACT NA SUSP: 2.0000 | Freq: Every day | NASAL | Status: DC

## 2016-01-08 MED ORDER — ELVITEG-COBIC-EMTRICIT-TENOFAF 150-150-200-10 MG PO TABS: 1.0000 | ORAL_TABLET | Freq: Every day | ORAL | Status: DC

## 2016-01-09 ENCOUNTER — Ambulatory Visit (INDEPENDENT_AMBULATORY_CARE_PROVIDER_SITE_OTHER): Payer: Medicaid Other | Admitting: Family Medicine

## 2016-01-09 ENCOUNTER — Encounter: Payer: Self-pay | Admitting: Family Medicine

## 2016-01-09 VITALS — BP 121/77 | HR 80 | Temp 98.6°F | Resp 16 | Ht 64.0 in | Wt 194.0 lb

## 2016-01-09 DIAGNOSIS — J302 Other seasonal allergic rhinitis: Secondary | ICD-10-CM | POA: Diagnosis not present

## 2016-01-09 DIAGNOSIS — M549 Dorsalgia, unspecified: Secondary | ICD-10-CM | POA: Diagnosis not present

## 2016-01-09 MED ORDER — ALBUTEROL SULFATE HFA 108 (90 BASE) MCG/ACT IN AERS: 2.0000 | INHALATION_SPRAY | Freq: Four times a day (QID) | RESPIRATORY_TRACT | Status: DC | PRN

## 2016-01-09 MED ORDER — CLINDAMYCIN PHOSPHATE 1 % EX LOTN: TOPICAL_LOTION | Freq: Two times a day (BID) | CUTANEOUS | Status: DC

## 2016-01-09 MED ORDER — MONTELUKAST SODIUM 10 MG PO TABS: 10.0000 mg | ORAL_TABLET | Freq: Every day | ORAL | Status: DC

## 2016-01-09 MED ORDER — BECLOMETHASONE DIPROPIONATE 40 MCG/ACT IN AERS
2.0000 | INHALATION_SPRAY | Freq: Two times a day (BID) | RESPIRATORY_TRACT | Status: DC
Start: 2016-01-09 — End: 2016-03-22

## 2016-01-09 MED ORDER — FEXOFENADINE-PSEUDOEPHED ER 180-240 MG PO TB24: 1.0000 | ORAL_TABLET | Freq: Every day | ORAL | Status: DC

## 2016-01-09 MED ORDER — BENZOYL PEROXIDE-ERYTHROMYCIN 5-3 % EX GEL: Freq: Two times a day (BID) | CUTANEOUS | Status: DC

## 2016-01-09 NOTE — Progress Notes (Signed)
Patient ID: LILIA SHEPHERD, female   DOB: Sep 19, 1980, 35 y.o.   MRN: [id number]   Averi Liesch, is a 35 y.o. female  [id number]  [id number]  DOB - Jan 11, 1981  CC:  Chief Complaint  Patient presents with  . Establish Care  . Back Pain    X 1 MONTH        HPI: Makani Bodge is a 35 y.o. female here to establish care. She has HIV and is followed by [facility]. She also has a history of back strain, hypercholesterolemia, genital herpes and bipolar disorder. She has recently had an outbreat of adult acne. She was prescribed a topical antibiotic but cannot afford. She also has a history of asthma, GERD and Diabetes.  Allergies  Allergen Reactions  . Benzoin Other (See Comments)    blisters  . Latex Rash and Other (See Comments)  . Sulfamethoxazole-Trimethoprim Itching and Nausea And Vomiting   Past Medical History  Diagnosis Date  . HIV infection ([place])   . Anxiety   . Depression   . Asthma   . GERD (gastroesophageal reflux disease)   . Neuromuscular disorder ([place])     carpal tunnel in both arms  . Human immunodeficiency virus (HIV) disease ([place]) 12/07/2012  . Herpes genitalis in women 12/07/2012  . Bipolar disorder, unspecified ([place]) 12/07/2012  . Anxiety state, unspecified 12/07/2012  . Unspecified asthma(493.90) 12/07/2012  . GERD (gastroesophageal reflux disease) 12/07/2012  . Anemia   . Arthritis   . Allergy   . Headache(784.0)     migraines  . Diabetes mellitus without complication ([place])     no meds currently- controlled with diet  . Type II or unspecified type diabetes mellitus without mention of complication, not stated as uncontrolled 12/07/2012   Current Outpatient Prescriptions on File Prior to Visit  Medication Sig Dispense Refill  . Darunavir Ethanolate (PREZISTA) 800 MG tablet TAKE 1 TABLET (800 MG TOTAL) BY MOUTH DAILY. 30 tablet 5  . elvitegravir-cobicistat-emtricitabine-tenofovir (GENVOYA) 150-150-200-10 MG TABS tablet Take 1 tablet by mouth daily with  breakfast. 30 tablet 5  . fluconazole (DIFLUCAN) 100 MG tablet Take 1 tablet (100 mg total) by mouth daily. 7 tablet 2  . flunisolide (NASALIDE) 25 MCG/ACT (0.025%) SOLN PLACE TWO   SPRAYS INTO EACH NOSTRIL AT BEDTIME. 1 Bottle 3  . lubiprostone (AMITIZA) 8 MCG capsule Take 1 capsule (8 mcg total) by mouth 2 (two) times daily with a meal. 60 capsule 5  . methocarbamol (ROBAXIN) 500 MG tablet Take 1 tablet (500 mg total) by mouth 2 (two) times daily. 20 tablet 0  . mometasone (NASONEX) 50 MCG/ACT nasal spray Place 2 sprays into the nose daily. 17 g 12  . naproxen (NAPROSYN) 375 MG tablet Take 1 tablet (375 mg total) by mouth 2 (two) times daily. 20 tablet 0  . omeprazole (PRILOSEC) 40 MG capsule TAKE 1 CAPSULE (40 MG TOTAL) BY MOUTH DAILY. 90 capsule 0  . valACYclovir (VALTREX) 500 MG tablet TAKE 1 TABLET (500 MG TOTAL) BY MOUTH DAILY. 30 tablet 5  . amitriptyline (ELAVIL) 50 MG tablet Take 1 tablet (50 mg total) by mouth at bedtime. (Patient not taking: Reported on 01/09/2016) 30 tablet 2  . cetirizine (ZYRTEC) 10 MG tablet Take 10 mg by mouth daily. Reported on 01/09/2016    . Fluticasone Furoate (ARNUITY ELLIPTA) 200 MCG/ACT AEPB Inhale into the lungs. Reported on 01/09/2016    . ibuprofen (ADVIL,MOTRIN) 800 MG tablet Take 1 tablet (800 mg total) by mouth 3 (three) times daily  with meals. (Patient not taking: Reported on 01/09/2016) 21 tablet 0  . levofloxacin (LEVAQUIN) 500 MG tablet Take 1 tablet (500 mg total) by mouth daily. (Patient not taking: Reported on 01/09/2016) 7 tablet 0  . methylPREDNISolone (MEDROL DOSEPAK) 4 MG TBPK tablet Take as directed (Patient not taking: Reported on 01/09/2016) 21 tablet 0   No current facility-administered medications on file prior to visit.   Family History  Problem Relation Age of Onset  . Arthritis/Rheumatoid Mother   . Hypertension Mother   . Cancer Maternal Aunt     brand and lung  . Cancer Maternal Grandmother     breasts  . Cancer Paternal  Grandmother     breast and kidney   Social History   Social History  . Marital Status: Single    Spouse Name: N/A  . Number of Children: 1  . Years of Education: 12   Occupational History  . Not on file.   Social History Main Topics  . Smoking status: Never Smoker   . Smokeless tobacco: Never Used  . Alcohol Use: 0.0 oz/week    0 Standard drinks or equivalent per week     Comment: OCC.  . Drug Use: No  . Sexual Activity:    Partners: Male    Birth Control/ Protection: Implant   Other Topics Concern  . Not on file   Social History Narrative   Patient lives alone.   Patient works at home.   Patient has hs education.   Patient has 1 child.   Patient drinks caffeine once in a while.    Review of Systems: Constitutional: Negative for fever, chills, appetite change, weight loss. Reports   Fatigue. Skin: Positive for facial outbreak HENT: Negative for ear pain, ear discharge.nose bleeds Eyes: Negative for pain, discharge, redness, itching and visual disturbance. Neck: Negative for pain, stiffness Respiratory: Negative for cough, shortness of breath,   Cardiovascular: Negative for chest pain, palpitations. Positive for lower leg swelling Gastrointestinal: Negative for abdominal pain, nausea, vomiting, diarrhea, constipations Genitourinary: Negative for dysuria, urgency, frequency, hematuria,  Musculoskeletal: Positive for back pain.  Negative for other joint pain, joint  swelling, and gait problem.Negative for weakness. Neurological: Negative for dizziness, tremors, seizures, syncope,   light-headedness, numbness. Positive for daily headaches Hematological: Negative for easy bruising or bleeding Psychiatric/Behavioral: Negative for depression, anxiety, decreased concentration, confusion   Objective:   Filed Vitals:   01/09/16 1407  BP: 121/77  Pulse: 80  Temp: 98.6 F (37 C)  Resp: 16    Physical Exam: Constitutional: Patient appears well-developed and  well-nourished. No distress. HENT: Normocephalic, atraumatic, External right and left ear normal. Oropharynx is clear and moist.  Eyes: Conjunctivae and EOM are normal. PERRLA, no scleral icterus. Neck: Normal ROM. Neck supple. No lymphadenopathy, No thyromegaly. CVS: RRR, S1/S2 +, no murmurs, no gallops, no rubs Pulmonary: Effort and breath sounds normal, no stridor, rhonchi, wheezes, rales.  Abdominal: Soft. Normoactive BS,, no distension, tenderness, rebound or guarding.  Musculoskeletal: Normal range of motion. No edema and no tenderness.  Neuro: Alert.Normal muscle tone coordination. Non-focal Skin: Skin is warm and dry. No rash noted. Not diaphoretic. No erythema. No pallor. Psychiatric: Normal mood and affect. Behavior, judgment, thought content normal.  Lab Results  Component Value Date   WBC 9.9 11/05/2015   HGB 13.6 11/05/2015   HCT 40.2 11/05/2015   MCV 95.0 11/05/2015   PLT 343 11/05/2015   Lab Results  Component Value Date   CREATININE 1.12* 11/05/2015  BUN 17 11/05/2015   NA 140 11/05/2015   K 4.8 11/05/2015   CL 104 11/05/2015   CO2 25 11/05/2015    Lab Results  Component Value Date   HGBA1C 5.3 06/30/2010   Lipid Panel     Component Value Date/Time   CHOL 252* 11/05/2015 1505   TRIG 122 11/05/2015 1505   HDL 40* 11/05/2015 1505   CHOLHDL 6.3* 11/05/2015 1505   VLDL 24 11/05/2015 1505   LDLCALC 188* 11/05/2015 1505       Assessment and plan:   1. Seasonal allergies -Fexofenadine HCl (ALLEGRA PO); Take by mouth.  2. Back pain, unspecified location - Ambulatory referral to Orthopedic Surgery   Return in about 6 months (around 07/11/2016), or if symptoms worsen or fail to improve.  The patient was given clear instructions to go to ER or return to medical center if symptoms don't improve, worsen or new problems develop. The patient verbalized understanding.    Micheline Chapman FNP  01/16/2016, 7:42 AM

## 2016-01-27 ENCOUNTER — Other Ambulatory Visit: Payer: Self-pay | Admitting: Family Medicine

## 2016-02-06 ENCOUNTER — Ambulatory Visit (INDEPENDENT_AMBULATORY_CARE_PROVIDER_SITE_OTHER): Payer: Medicaid Other | Admitting: *Deleted

## 2016-02-06 ENCOUNTER — Other Ambulatory Visit (HOSPITAL_COMMUNITY)
Admission: RE | Admit: 2016-02-06 | Discharge: 2016-02-06 | Disposition: A | Discharge status: Home / Self Care | Payer: Medicaid Other | Source: Ambulatory Visit | Attending: Internal Medicine | Admitting: Internal Medicine

## 2016-02-06 DIAGNOSIS — Z01411 Encounter for gynecological examination (general) (routine) with abnormal findings: Secondary | ICD-10-CM | POA: Diagnosis present

## 2016-02-06 DIAGNOSIS — R896 Abnormal cytological findings in specimens from other organs, systems and tissues: Secondary | ICD-10-CM | POA: Diagnosis not present

## 2016-02-06 DIAGNOSIS — Z113 Encounter for screening for infections with a predominantly sexual mode of transmission: Secondary | ICD-10-CM | POA: Insufficient documentation

## 2016-02-06 DIAGNOSIS — Z124 Encounter for screening for malignant neoplasm of cervix: Secondary | ICD-10-CM

## 2016-02-06 DIAGNOSIS — N898 Other specified noninflammatory disorders of vagina: Secondary | ICD-10-CM | POA: Diagnosis present

## 2016-02-06 DIAGNOSIS — IMO0002 Reserved for concepts with insufficient information to code with codable children: Secondary | ICD-10-CM

## 2016-02-06 NOTE — Progress Notes (Signed)
  Subjective:     [name] is a 35 y.o. woman who comes in today for a  pap smear only.  Previous abnormal Pap smears: yes. Contraception: implant/condoms.  Vaginal itching.  Objective:    There were no vitals taken for this visit. Pelvic Exam:  Pap smear obtained.   Assessment:    Screening pap smear.  Pt given educational materials re: HIV and women, self-esteem, BSE, nutrition and diet management, PAP smears and partner safety. Pt given condoms  Plan:    Follow up in one year, or as indicated by Pap results.

## 2016-02-06 NOTE — Patient Instructions (Signed)
Patient needs to change to all cotton underwear to decrease moisture.  Results will be ready later next week.  You will be called if there is need for additional treatment.  Thank you for coming to the Center for your care.  Langley Gauss

## 2016-02-07 ENCOUNTER — Other Ambulatory Visit: Payer: Self-pay | Admitting: Family Medicine

## 2016-02-09 LAB — CERVICOVAGINAL ANCILLARY ONLY
Chlamydia: NEGATIVE
Neisseria Gonorrhea: NEGATIVE

## 2016-02-09 LAB — CYTOLOGY - PAP

## 2016-02-09 NOTE — Addendum Note (Signed)
Addended by: Dolan Amen D on: 02/09/2016 12:05 PM   Modules accepted: Orders

## 2016-02-09 NOTE — Addendum Note (Signed)
Addended by: Landis Gandy on: 02/09/2016 11:56 AM   Modules accepted: Orders

## 2016-02-10 ENCOUNTER — Telehealth: Payer: Self-pay | Admitting: *Deleted

## 2016-02-10 LAB — CERVICOVAGINAL ANCILLARY ONLY: WET PREP (BD AFFIRM): NEGATIVE

## 2016-02-10 NOTE — Telephone Encounter (Signed)
Patient asking for lab results, specifically for bacterial vaginosis.  Spoke with [facility] in cytology per Dolan Amen: [phone].   Stanton Kidney will call to find results, relay them back to triage. Will call patient with results. Landis Gandy, RN'

## 2016-02-13 ENCOUNTER — Telehealth: Payer: Self-pay

## 2016-02-13 ENCOUNTER — Other Ambulatory Visit: Payer: Self-pay | Admitting: *Deleted

## 2016-02-13 MED ORDER — METRONIDAZOLE 500 MG PO TABS: 500.0000 mg | ORAL_TABLET | Freq: Two times a day (BID) | ORAL | Status: DC

## 2016-02-13 NOTE — Telephone Encounter (Signed)
Verbal order per Dr. Baxter Flattery called into the pharmacy, patient notified. Flagyl 500mg  twice daily for 7 days. Landis Gandy, RN

## 2016-02-13 NOTE — Telephone Encounter (Signed)
Patient called triage wanting to talk with Sharyn Lull, RN about bacterial vaginosis results and about orders for medication that RN was going to see about getting an order for. Patient stated having symptoms for bacterial vaginosis and wants medication to be sent to [facility] on [address]. Explained to patient RN is in clinic today but unavaliable until after 1:30. Patient stated understanding. Will forward message to [place], [place]. Rodman Key, LPN

## 2016-02-25 NOTE — Addendum Note (Signed)
Addended by: Lorne Skeens D on: 02/25/2016 12:05 PM   Modules accepted: Orders

## 2016-03-22 ENCOUNTER — Other Ambulatory Visit: Payer: Self-pay | Admitting: Family Medicine

## 2016-03-29 ENCOUNTER — Other Ambulatory Visit: Payer: Self-pay | Admitting: Infectious Diseases

## 2016-03-29 ENCOUNTER — Telehealth: Payer: Self-pay | Admitting: *Deleted

## 2016-03-29 DIAGNOSIS — B20 Human immunodeficiency virus [HIV] disease: Secondary | ICD-10-CM

## 2016-03-29 DIAGNOSIS — B009 Herpesviral infection, unspecified: Secondary | ICD-10-CM

## 2016-03-29 NOTE — Telephone Encounter (Signed)
Patient called requesting an appt for fatigue and "bumps" in her pubic area. Advised to call and schedule an appt with her PCP, Sharon Seller to be evaluated. [name]

## 2016-04-01 ENCOUNTER — Encounter (HOSPITAL_COMMUNITY): Payer: Self-pay | Admitting: Emergency Medicine

## 2016-04-01 ENCOUNTER — Ambulatory Visit (HOSPITAL_COMMUNITY)
Admission: EM | Admit: 2016-04-01 | Discharge: 2016-04-01 | Disposition: A | Discharge status: Home / Self Care | Payer: Medicaid Other | Attending: Family | Admitting: Family

## 2016-04-01 DIAGNOSIS — J0100 Acute maxillary sinusitis, unspecified: Secondary | ICD-10-CM

## 2016-04-01 DIAGNOSIS — R599 Enlarged lymph nodes, unspecified: Secondary | ICD-10-CM | POA: Diagnosis not present

## 2016-04-01 DIAGNOSIS — L739 Follicular disorder, unspecified: Secondary | ICD-10-CM | POA: Diagnosis not present

## 2016-04-01 DIAGNOSIS — R590 Localized enlarged lymph nodes: Secondary | ICD-10-CM

## 2016-04-01 MED ORDER — TRIAMCINOLONE ACETONIDE 0.1 % EX CREA: 1.0000 "application " | TOPICAL_CREAM | Freq: Two times a day (BID) | CUTANEOUS | 0 refills | Status: DC | PRN

## 2016-04-01 MED ORDER — AMOXICILLIN-POT CLAVULANATE 875-125 MG PO TABS: 1.0000 | ORAL_TABLET | Freq: Two times a day (BID) | ORAL | 0 refills | Status: AC

## 2016-04-01 MED ORDER — FLUCONAZOLE 150 MG PO TABS: 150.0000 mg | ORAL_TABLET | Freq: Once | ORAL | 0 refills | Status: AC

## 2016-04-01 NOTE — ED Triage Notes (Signed)
Pt c/o swollen lymph node on left submandibular onset 7 days... Reports pain will radiate down left side of chest  Also reports rash on pubic area onset x1 month.  A&O x4... NAD

## 2016-04-01 NOTE — Discharge Instructions (Signed)
Start Augmentin twice a day as directed. Use Diflucan as needed for yeast infection caused by antibiotic use. Recommend triamcinolone cream twice a day to affected area as needed. Follow-up with your primary care provider as planned.

## 2016-04-01 NOTE — ED Provider Notes (Signed)
CSN: [id number]     Arrival date & time 04/01/16  1223 History   First MD Initiated Contact with Patient 04/01/16 1428     Chief Complaint  Patient presents with  . Lymphadenopathy   (Consider location/radiation/quality/duration/timing/severity/associated sxs/prior Treatment) 35 year old female presents with left sided lymph node swelling in her neck and sinus pressure and congestion for about 1 week. She denies any fever or cough. She has a history of allergies and asthma and is immunocompromised.  She also presents with a rash in her pubic hair for the past month. She has irritated hair follicles that are swollen, itchy and painful. She has tried OTC hydrocortisone cream with no relief.    The history is provided by the patient.    Past Medical History:  Diagnosis Date  . Allergy   . Anemia   . Anxiety   . Anxiety state, unspecified 12/07/2012  . Arthritis   . Asthma   . Bipolar disorder, unspecified ([place]) 12/07/2012  . Depression   . Diabetes mellitus without complication ([place])    no meds currently- controlled with diet  . GERD (gastroesophageal reflux disease)   . GERD (gastroesophageal reflux disease) 12/07/2012  . Headache(784.0)    migraines  . Herpes genitalis in women 12/07/2012  . HIV infection ([place])   . Human immunodeficiency virus (HIV) disease ([place]) 12/07/2012  . Neuromuscular disorder ([place])    carpal tunnel in both arms  . Type II or unspecified type diabetes mellitus without mention of complication, not stated as uncontrolled 12/07/2012  . Unspecified asthma(493.90) 12/07/2012   Past Surgical History:  Procedure Laterality Date  . CESAREAN SECTION    . COLON SURGERY     colon repair after salpingo  . INSERTION OF MESH N/A 11/06/2014   Procedure: INSERTION OF MESH;  Surgeon: Stark Klein, MD;  Location: [facility];  Service: General;  Laterality: N/A;  . LAPAROSCOPIC HEPATECTOMY N/A 06/28/2013   Procedure: LAPAROSCOPIC EXCISION HEPATIC MASS;  Surgeon: Stark Klein,  MD;  Location: [facility];  Service: General;  Laterality: N/A;  . LAPAROSCOPIC LYSIS OF ADHESIONS N/A 12/04/2012   Procedure: LAPAROSCOPIC LYSIS OF ADHESIONS;  Surgeon: [name], MD;  Location: [facility];  Service: Gynecology;  Laterality: N/A;  . LAPAROTOMY N/A 12/04/2012   Procedure: EXPLORATORY LAPAROTOMY;  Surgeon: [name], MD;  Location: [facility];  Service: Gynecology;  Laterality: N/A;  repair of serosal injury  . ROBOTIC ASSISTED BILATERAL SALPINGO OOPHERECTOMY Right 12/04/2012   Procedure: ROBOTIC ASSISTED Right  SALPINGO OOPHORECTOMY;  Surgeon: [name], MD;  Location: [facility];  Service: Gynecology;  Laterality: Right;  . VENTRAL HERNIA REPAIR N/A 11/06/2014   Procedure: LAPAROSCOPIC ASSITED INCARCERATED VENTRAL HERNIA REPAIR WITH MESH;  Surgeon: Stark Klein, MD;  [facility];  Service: General;  Laterality: N/A;   Family History  Problem Relation Age of Onset  . Arthritis/Rheumatoid Mother   . Hypertension Mother   . Cancer Maternal Aunt     brand and lung  . Cancer Maternal Grandmother     breasts  . Cancer Paternal Grandmother     breast and kidney   Social History  Substance Use Topics  . Smoking status: Never Smoker  . Smokeless tobacco: Never Used  . Alcohol use 0.0 oz/week     Comment: OCC.   OB History    Gravida Para Term Preterm AB Living   3 1 1  0 2 1   SAB TAB Ectopic Multiple Live Births   1 1 0 0  Review of Systems  Constitutional: Negative for chills, fatigue and fever.  HENT: Positive for congestion and sinus pressure. Negative for ear pain and sore throat.   Respiratory: Negative for cough and wheezing.   Genitourinary: Negative for dysuria, genital sores, pelvic pain and vaginal discharge.  Skin: Positive for rash.  Neurological: Negative for headaches.  Hematological: Positive for adenopathy.    Allergies  Benzoin; Latex; and Sulfamethoxazole-trimethoprim  Home Medications   Prior to Admission medications     Medication Sig Start Date End Date Taking? Authorizing Provider  benzoyl peroxide-erythromycin (BENZAMYCIN) gel Apply topically 2 (two) times daily. 01/09/16  Yes Micheline Chapman, NP  Darunavir Ethanolate (PREZISTA) 800 MG tablet TAKE 1 TABLET (800 MG TOTAL) BY MOUTH DAILY. 01/08/16  Yes Truman Hayward, MD  elvitegravir-cobicistat-emtricitabine-tenofovir [facility]) 150-150-200-10 MG TABS tablet Take 1 tablet by mouth daily with breakfast. 01/08/16  Yes Truman Hayward, MD  flunisolide (NASALIDE) 25 MCG/ACT (0.025%) SOLN PLACE TWO   SPRAYS INTO EACH NOSTRIL AT BEDTIME. 02/28/15  Yes Truman Hayward, MD  lubiprostone (AMITIZA) 8 MCG capsule Take 1 capsule (8 mcg total) by mouth 2 (two) times daily with a meal. 03/05/15  Yes Jerene Bears, MD  montelukast (SINGULAIR) 10 MG tablet Take 1 tablet (10 mg total) by mouth at bedtime. 01/09/16  Yes Micheline Chapman, NP  omeprazole (PRILOSEC) 40 MG capsule TAKE 1 CAPSULE (40 MG TOTAL) BY MOUTH DAILY. 10/22/14  Yes Jerene Bears, MD  valACYclovir (VALTREX) 500 MG tablet TAKE 1 TABLET (500 MG TOTAL) BY MOUTH DAILY. 03/29/16  Yes Campbell Riches, MD  amoxicillin-clavulanate (AUGMENTIN) 875-125 MG tablet Take 1 tablet by mouth every 12 (twelve) hours. 04/01/16 04/11/16  Katy Apo, NP  cetirizine (ZYRTEC) 10 MG tablet Take 10 mg by mouth daily. Reported on 01/09/2016    Historical Provider, MD  clindamycin (CLEOCIN T) 1 % lotion APPLY TOPICALLY TWO TIMES DAILY. 01/27/16   Micheline Chapman, NP  Fluticasone Furoate (ARNUITY ELLIPTA) 200 MCG/ACT AEPB Inhale into the lungs. Reported on 01/09/2016    Historical Provider, MD  mometasone (NASONEX) 50 MCG/ACT nasal spray Place 2 sprays into the nose daily. 01/08/16   Truman Hayward, MD  PROAIR HFA 108 [phone] Base) MCG/ACT inhaler INHALE TWO PUFFS INTO THE LUNGS EVERY 6 (SIX) HOURS AS NEEDED FOR WHEEZING. 02/09/16   Micheline Chapman, NP  QVAR 40 MCG/ACT inhaler INHALE TWO PUFFS INTO THE LUNGS TWO TIMES DAILY.  03/22/16   Micheline Chapman, NP  triamcinolone cream (KENALOG) 0.1 % Apply 1 application topically 2 (two) times daily as needed. 04/01/16   Katy Apo, NP   Meds Ordered and Administered this Visit  Medications - No data to display  BP 123/75 (BP Location: Right Arm)   Pulse 65   Temp 98.2 F (36.8 C) (Oral)   Resp 18   SpO2 100%  No data found.   Physical Exam  Constitutional: She is oriented to person, place, and time. She appears well-developed and well-nourished. No distress.  HENT:  Head: Normocephalic and atraumatic.  Right Ear: Hearing, tympanic membrane, external ear and ear canal normal.  Left Ear: Hearing, tympanic membrane, external ear and ear canal normal.  Nose: Mucosal edema present. Right sinus exhibits maxillary sinus tenderness and frontal sinus tenderness. Left sinus exhibits maxillary sinus tenderness and frontal sinus tenderness.  Mouth/Throat: Uvula is midline, oropharynx is clear and moist and mucous membranes are normal.  Neck: Normal range of  motion. Neck supple.  Cardiovascular: Normal rate, regular rhythm and normal heart sounds.   Pulmonary/Chest: Effort normal and breath sounds normal.  Abdominal: Soft. Normal appearance. There is no tenderness.    Lymphadenopathy:       Head (left side): Tonsillar adenopathy present.    She has cervical adenopathy.       Left cervical: Superficial cervical adenopathy present. No deep cervical adenopathy present.  Neurological: She is alert and oriented to person, place, and time.  Skin: Skin is warm, dry and intact. Lesion and rash noted. Rash is papular and pustular.  Multiple red papular and some pustular lesions present in her public hair line. Some swollen and tender. No discharge or crusting.   Psychiatric: She has a normal mood and affect. Her behavior is normal. Judgment and thought content normal.    Urgent Care Course   Clinical Course    Procedures (including critical care time)  Labs  Review Labs Reviewed - No data to display  Imaging Review No results found.   Visual Acuity Review  Right Eye Distance:   Left Eye Distance:   Bilateral Distance:    Right Eye Near:   Left Eye Near:    Bilateral Near:         MDM   1. Acute maxillary sinusitis, recurrence not specified   2. Lymphadenopathy, anterior cervical   3. Folliculitis    Discussed various antibiotic treatments that have worked well in the past for sinus and skin infections- Recommend Augmentin 875mg  twice a day for 10 days. Use Diflucan 150mg  - take 1 tablet every 3 days while on antibiotic to prevent yeast infection. May use Triamcinolone cream - apply to irritated pubic hair as needed. Recommend follow-up with her primary care provider in 5 to 7 days if not improving.     Katy Apo, NP 04/02/16 1121

## 2016-04-15 ENCOUNTER — Encounter: Payer: Medicaid Other | Admitting: Obstetrics and Gynecology

## 2016-04-22 ENCOUNTER — Encounter: Payer: Medicaid Other | Admitting: Obstetrics and Gynecology

## 2016-04-26 ENCOUNTER — Other Ambulatory Visit: Payer: Self-pay | Admitting: Family Medicine

## 2016-04-26 ENCOUNTER — Ambulatory Visit (INDEPENDENT_AMBULATORY_CARE_PROVIDER_SITE_OTHER): Payer: Medicaid Other

## 2016-04-26 DIAGNOSIS — Z23 Encounter for immunization: Secondary | ICD-10-CM

## 2016-04-27 ENCOUNTER — Ambulatory Visit (INDEPENDENT_AMBULATORY_CARE_PROVIDER_SITE_OTHER): Payer: Medicaid Other | Admitting: Family Medicine

## 2016-04-27 VITALS — BP 111/66 | HR 72 | Temp 98.0°F | Resp 18 | Ht 64.0 in | Wt 197.0 lb

## 2016-04-27 DIAGNOSIS — J0100 Acute maxillary sinusitis, unspecified: Secondary | ICD-10-CM

## 2016-04-27 DIAGNOSIS — Z0000 Encounter for general adult medical examination without abnormal findings: Secondary | ICD-10-CM | POA: Diagnosis not present

## 2016-04-27 LAB — POCT GLYCOSYLATED HEMOGLOBIN (HGB A1C): Hemoglobin A1C: 5.2

## 2016-04-27 MED ORDER — LEVOFLOXACIN 500 MG PO TABS: 500.0000 mg | ORAL_TABLET | Freq: Every day | ORAL | 0 refills | Status: DC

## 2016-04-27 NOTE — Progress Notes (Signed)
Patient is here for FU  Patient complains of sinus headache being present for the past 2 weeks.  Patient has taken medication today and patient has eaten today.

## 2016-04-28 ENCOUNTER — Other Ambulatory Visit: Payer: Self-pay | Admitting: Family Medicine

## 2016-04-28 LAB — MICROALBUMIN / CREATININE URINE RATIO
Creatinine, Urine: 229 mg/dL (ref 20–320)
Microalb Creat Ratio: 2 mcg/mg creat (ref <30)
Microalb, Ur: 0.4 mg/dL

## 2016-04-28 MED ORDER — MONTELUKAST SODIUM 10 MG PO TABS: 10.0000 mg | ORAL_TABLET | Freq: Every day | ORAL | 1 refills | Status: DC

## 2016-04-28 MED ORDER — LUBIPROSTONE 8 MCG PO CAPS: 8.0000 ug | ORAL_CAPSULE | Freq: Two times a day (BID) | ORAL | 5 refills | Status: DC

## 2016-04-28 MED ORDER — FEXOFENADINE HCL 180 MG PO TABS: 180.0000 mg | ORAL_TABLET | Freq: Every day | ORAL | 1 refills | Status: DC

## 2016-04-29 LAB — URINE CULTURE

## 2016-05-03 ENCOUNTER — Ambulatory Visit: Payer: Self-pay

## 2016-05-03 ENCOUNTER — Other Ambulatory Visit: Payer: Self-pay | Admitting: Occupational Medicine

## 2016-05-03 DIAGNOSIS — M79671 Pain in right foot: Secondary | ICD-10-CM

## 2016-05-06 ENCOUNTER — Telehealth: Payer: Self-pay

## 2016-05-06 NOTE — Telephone Encounter (Signed)
Called, no answer. Left message advising of normal labs and if any question to call back to our office. Thanks!

## 2016-05-17 ENCOUNTER — Telehealth: Payer: Self-pay | Admitting: *Deleted

## 2016-05-17 NOTE — Telephone Encounter (Signed)
Patient walked in complaining of infected hair follicles in her pubic area. She states she went to the Urgent Care regarding these previously, was given oral antibiotics that were ineffective. One ruptured on her today. She states the next available appointment at her PCP is next month. She would like some kind of topical antibiotic cream for treatment.  She returns to [facility] tomorrow for labs, is seeing Gynecology on 10/11. Please advise. Patient has

## 2016-05-17 NOTE — Telephone Encounter (Signed)
I would give her doxy 100mg  po bid for 10 days, #20, no refill thanks

## 2016-05-18 ENCOUNTER — Other Ambulatory Visit (INDEPENDENT_AMBULATORY_CARE_PROVIDER_SITE_OTHER): Payer: Self-pay

## 2016-05-18 ENCOUNTER — Other Ambulatory Visit: Payer: Self-pay | Admitting: *Deleted

## 2016-05-18 DIAGNOSIS — Z79899 Other long term (current) drug therapy: Secondary | ICD-10-CM

## 2016-05-18 DIAGNOSIS — B20 Human immunodeficiency virus [HIV] disease: Secondary | ICD-10-CM

## 2016-05-18 LAB — COMPREHENSIVE METABOLIC PANEL
ALT: 16 U/L (ref 6–29)
AST: 18 U/L (ref 10–30)
Albumin: 3.8 g/dL (ref 3.6–5.1)
Alkaline Phosphatase: 62 U/L (ref 33–115)
BUN: 12 mg/dL (ref 7–25)
CHLORIDE: 104 mmol/L (ref 98–110)
CO2: 24 mmol/L (ref 20–31)
Calcium: 8.8 mg/dL (ref 8.6–10.2)
Creat: 1.15 mg/dL — ABNORMAL HIGH (ref 0.50–1.10)
GLUCOSE: 94 mg/dL (ref 65–99)
POTASSIUM: 4.2 mmol/L (ref 3.5–5.3)
Sodium: 137 mmol/L (ref 135–146)
Total Bilirubin: 0.6 mg/dL (ref 0.2–1.2)
Total Protein: 6.1 g/dL (ref 6.1–8.1)

## 2016-05-18 LAB — LIPID PANEL
CHOL/HDL RATIO: 5.8 ratio — AB (ref <=5.0)
Cholesterol: 226 mg/dL — ABNORMAL HIGH (ref 125–200)
HDL: 39 mg/dL — ABNORMAL LOW (ref >=46)
LDL Cholesterol: 171 mg/dL — ABNORMAL HIGH (ref <130)
Triglycerides: 82 mg/dL (ref <150)
VLDL: 16 mg/dL (ref <30)

## 2016-05-18 LAB — CBC
HCT: 38.6 % (ref 35.0–45.0)
Hemoglobin: 13.2 g/dL (ref 11.7–15.5)
MCH: 32.7 pg (ref 27.0–33.0)
MCHC: 34.2 g/dL (ref 32.0–36.0)
MCV: 95.5 fL (ref 80.0–100.0)
MPV: 9.4 fL (ref 7.5–12.5)
PLATELETS: 286 10*3/uL (ref 140–400)
RBC: 4.04 MIL/uL (ref 3.80–5.10)
RDW: 13.3 % (ref 11.0–15.0)
WBC: 7.7 10*3/uL (ref 3.8–10.8)

## 2016-05-18 MED ORDER — DOXYCYCLINE HYCLATE 100 MG PO TABS: 100.0000 mg | ORAL_TABLET | Freq: Two times a day (BID) | ORAL | 0 refills | Status: DC

## 2016-05-18 MED FILL — DOXYCYCLINE HYCLATE 100 MG: 100 | 10 days supply | Qty: 20 | Fill #0

## 2016-05-18 NOTE — Telephone Encounter (Signed)
Thank you :)

## 2016-05-18 NOTE — Telephone Encounter (Signed)
Sent rx. Thanks.  

## 2016-05-19 ENCOUNTER — Other Ambulatory Visit: Payer: Self-pay | Admitting: *Deleted

## 2016-05-19 DIAGNOSIS — B20 Human immunodeficiency virus [HIV] disease: Secondary | ICD-10-CM

## 2016-05-19 DIAGNOSIS — B009 Herpesviral infection, unspecified: Secondary | ICD-10-CM

## 2016-05-19 LAB — T-HELPER CELL (CD4) - (RCID CLINIC ONLY)
CD4 % Helper T Cell: 34 % (ref 33–55)
CD4 T CELL ABS: 1300 /uL (ref 400–2700)

## 2016-05-19 MED ORDER — DARUNAVIR ETHANOLATE 800 MG PO TABS: ORAL_TABLET | ORAL | 3 refills | Status: DC

## 2016-05-19 MED ORDER — ELVITEG-COBIC-EMTRICIT-TENOFAF 150-150-200-10 MG PO TABS: 1.0000 | ORAL_TABLET | Freq: Every day | ORAL | 3 refills | Status: DC

## 2016-05-20 LAB — HIV-1 RNA QUANT-NO REFLEX-BLD: HIV-1 RNA Quant, Log: <1.3 Log copies/mL (ref <1.30)

## 2016-05-21 ENCOUNTER — Encounter: Payer: Self-pay | Admitting: Infectious Disease

## 2016-05-25 ENCOUNTER — Telehealth: Payer: Self-pay | Admitting: Pharmacist Clinician (PhC)/ Clinical Pharmacy Specialist

## 2016-05-25 NOTE — Telephone Encounter (Signed)
Called to see if her meds have arrived yet. Langley Gauss said that they have.

## 2016-05-26 ENCOUNTER — Ambulatory Visit (INDEPENDENT_AMBULATORY_CARE_PROVIDER_SITE_OTHER): Payer: Self-pay | Admitting: Obstetrics and Gynecology

## 2016-05-26 VITALS — BP 130/80 | HR 84 | Wt 190.0 lb

## 2016-05-26 DIAGNOSIS — N764 Abscess of vulva: Secondary | ICD-10-CM

## 2016-05-26 NOTE — Progress Notes (Signed)
35 yo G3P1021 presenting today to discuss recurrent in-grown hair infection. Patient reports recurrent infections in the same area for the past 4 months. She states that they never seem to disappear; they improve but return forming an abscess and draining spontaneously. She admits to shaving or using Carlton Adam to remove hair on her mons and bikini region. She states that her HgA1c was 5.2 and she has not taken any diabetic medication since 2010. She reports sweating a lot in the vulva region which is the reason why she removes hair. She is without any other complaints. Patient is currently on doxy prescribed by PCP  Past Medical History:  Diagnosis Date  . Allergy   . Anemia   . Anxiety   . Anxiety state, unspecified 12/07/2012  . Arthritis   . Asthma   . Bipolar disorder, unspecified ([place]) 12/07/2012  . Depression   . Diabetes mellitus without complication ([place])    no meds currently- controlled with diet  . GERD (gastroesophageal reflux disease)   . GERD (gastroesophageal reflux disease) 12/07/2012  . Headache(784.0)    migraines  . Herpes genitalis in women 12/07/2012  . HIV infection ([place])   . Human immunodeficiency virus (HIV) disease ([place]) 12/07/2012  . Neuromuscular disorder ([place])    carpal tunnel in both arms  . Type II or unspecified type diabetes mellitus without mention of complication, not stated as uncontrolled 12/07/2012  . Unspecified asthma(493.90) 12/07/2012   Past Surgical History:  Procedure Laterality Date  . CESAREAN SECTION    . COLON SURGERY     colon repair after salpingo  . INSERTION OF MESH N/A 11/06/2014   Procedure: INSERTION OF MESH;  Surgeon: Stark Klein, MD;  [facility];  Service: General;  Laterality: N/A;  . LAPAROSCOPIC HEPATECTOMY N/A 06/28/2013   Procedure: LAPAROSCOPIC EXCISION HEPATIC MASS;  Surgeon: Stark Klein, MD;  Location: [facility];  Service: General;  Laterality: N/A;  . LAPAROSCOPIC LYSIS OF ADHESIONS N/A 12/04/2012   Procedure: LAPAROSCOPIC  LYSIS OF ADHESIONS;  Surgeon: [name], MD;  Location: [facility];  Service: Gynecology;  Laterality: N/A;  . LAPAROTOMY N/A 12/04/2012   Procedure: EXPLORATORY LAPAROTOMY;  Surgeon: [name], MD;  Location: [facility];  Service: Gynecology;  Laterality: N/A;  repair of serosal injury  . ROBOTIC ASSISTED BILATERAL SALPINGO OOPHERECTOMY Right 12/04/2012   Procedure: ROBOTIC ASSISTED Right  SALPINGO OOPHORECTOMY;  Surgeon: [name], MD;  Location: [facility];  Service: Gynecology;  Laterality: Right;  . VENTRAL HERNIA REPAIR N/A 11/06/2014   Procedure: LAPAROSCOPIC ASSITED INCARCERATED VENTRAL HERNIA REPAIR WITH MESH;  Surgeon: Stark Klein, MD;  [facility];  Service: General;  Laterality: N/A;   Family History  Problem Relation Age of Onset  . Arthritis/Rheumatoid Mother   . Hypertension Mother   . Cancer Maternal Aunt     brand and lung  . Cancer Maternal Grandmother     breasts  . Cancer Paternal Grandmother     breast and kidney   Social History  Substance Use Topics  . Smoking status: Never Smoker  . Smokeless tobacco: Never Used  . Alcohol use 0.0 oz/week     Comment: OCC.   ROS See pertinent in HPI  Blood pressure 130/80, pulse 84, weight 190 lb (86.2 kg), last menstrual period 04/19/2016. GENERAL: Well-developed, well-nourished female in no acute distress.  ABDOMEN: Soft, nontender, nondistended. No organomegaly. PELVIC: Normal external female genitalia. 2 scarred area on mons pubis without evidence of infection. No fluctuance or erythema. No active drainage.  Non tender to touch EXTREMITIES: No cyanosis, clubbing, or edema, 2+ distal pulses.  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

## 2016-06-02 ENCOUNTER — Other Ambulatory Visit: Payer: Self-pay | Admitting: *Deleted

## 2016-06-02 DIAGNOSIS — B20 Human immunodeficiency virus [HIV] disease: Secondary | ICD-10-CM

## 2016-06-02 DIAGNOSIS — B009 Herpesviral infection, unspecified: Secondary | ICD-10-CM

## 2016-06-02 MED ORDER — ELVITEG-COBIC-EMTRICIT-TENOFAF 150-150-200-10 MG PO TABS: 1.0000 | ORAL_TABLET | Freq: Every day | ORAL | 1 refills | Status: DC

## 2016-06-02 MED ORDER — DARUNAVIR ETHANOLATE 800 MG PO TABS: ORAL_TABLET | ORAL | 1 refills | Status: DC

## 2016-06-21 ENCOUNTER — Telehealth: Payer: Self-pay | Admitting: *Deleted

## 2016-06-21 ENCOUNTER — Ambulatory Visit (INDEPENDENT_AMBULATORY_CARE_PROVIDER_SITE_OTHER): Payer: Self-pay | Admitting: Internal Medicine

## 2016-06-21 DIAGNOSIS — J45991 Cough variant asthma: Secondary | ICD-10-CM

## 2016-06-21 DIAGNOSIS — J069 Acute upper respiratory infection, unspecified: Secondary | ICD-10-CM

## 2016-06-21 DIAGNOSIS — B20 Human immunodeficiency virus [HIV] disease: Secondary | ICD-10-CM

## 2016-06-21 MED ORDER — MONTELUKAST SODIUM 10 MG PO TABS: 10.0000 mg | ORAL_TABLET | Freq: Every day | ORAL | 5 refills | Status: DC

## 2016-06-21 NOTE — Assessment & Plan Note (Signed)
She has an acute upper respiratory infection. I will have her use generic Afrin nasal spray to help relieve her congestion and headache and reduce postnasal drip and coughing. She is currently in no respiratory distress and not wheezing.

## 2016-06-21 NOTE — Telephone Encounter (Signed)
Patient calling to see if there is any available appointments. She reports cold-like symptoms "for a while" that have increased over the last week - reports increased congestion in her chest, a tight cough, and suspected fever (she did not take her temperature). Patient lost her medicaid last month, but is compliant with medication, now has RW/ADAP. Patient scheduled for 1:45 with clinic MD, Dr Megan Salon. Landis Gandy, RN

## 2016-06-21 NOTE — Assessment & Plan Note (Signed)
Her adherence is very good and her infection remains under very good control. She will follow-up at her routine, scheduled visit to Dr. Johnnye Sima.

## 2016-06-21 NOTE — Assessment & Plan Note (Signed)
She reports increased wheezing recently but has none on exam today. She is unable to obtain her Qvar inhaler and is almost out of pro-air and will have trouble getting it since she has lost her Medicaid. I will have her meet with our medication financial counselor today.

## 2016-06-21 NOTE — Progress Notes (Signed)
Patient Active Problem List   Diagnosis Date Noted  . Human immunodeficiency virus (HIV) disease ([place]) 11/10/2006    Priority: High  . Acute upper respiratory infection 06/21/2016  . Hyperlipidemia 11/17/2015  . Sinusitis 06/30/2015  . Hernia, abdominal 08/05/2014  . Vaginitis and vulvovaginitis 06/19/2014  . 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 Lung abscess ([place]) 06/26/2013  . 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   05/07/2013  . Ovarian cystic mass 05/07/2013  . s/p Serosal repair of colon 12/04/2012 12/15/2012  . Herpes genitalis in women 12/07/2012  . Bipolar disorder, unspecified 12/07/2012  . Anxiety state, unspecified 12/07/2012  . Unspecified asthma(493.90) 12/07/2012  . GERD (gastroesophageal reflux disease) 12/07/2012  . Pelvic mass in female s/p Robotic-assisted right salpingo-oophorectomy 12/04/2012  . SHINGLES 01/31/2009  . HEMORRHOIDS NOS, W/O COMPLICATIONS 99991111  . DISORDER, DEPRESSIVE NEC 11/25/2006  . Allergic rhinitis 11/25/2006  . ASTHMA, COUGH VARIANT 11/15/2006    Patient's Medications  New Prescriptions   No medications on file  Previous Medications   DARUNAVIR ETHANOLATE (PREZISTA) 800 MG TABLET    TAKE 1 TABLET (800 MG TOTAL) BY MOUTH DAILY.   ELVITEGRAVIR-COBICISTAT-EMTRICITABINE-TENOFOVIR (GENVOYA) 150-150-200-10 MG TABS TABLET    Take 1 tablet by mouth daily with breakfast.   FEXOFENADINE (ALLEGRA ALLERGY) 180 MG TABLET    Take 1 tablet (180 mg total) by mouth daily.   FLUNISOLIDE (NASALIDE) 25 MCG/ACT (0.025%) SOLN    PLACE TWO   SPRAYS INTO EACH NOSTRIL AT BEDTIME.   LUBIPROSTONE (AMITIZA) 8 MCG CAPSULE    Take 1 capsule (8 mcg total) by mouth 2 (two) times daily with a meal.   MOMETASONE (NASONEX) 50 MCG/ACT NASAL SPRAY    Place 2 sprays into the nose daily.   OMEPRAZOLE (PRILOSEC) 40 MG CAPSULE    TAKE 1 CAPSULE (40 MG TOTAL) BY MOUTH DAILY.   PROAIR HFA 108 (90 BASE)  MCG/ACT INHALER    INHALE TWO PUFFS INTO THE LUNGS EVERY 6 (SIX) HOURS AS NEEDED FOR WHEEZING.   QVAR 40 MCG/ACT INHALER    INHALE TWO PUFFS INTO THE LUNGS TWO TIMES DAILY.   TRIAMCINOLONE CREAM (KENALOG) 0.1 %    Apply 1 application topically 2 (two) times daily as needed.   VALACYCLOVIR (VALTREX) 500 MG TABLET    TAKE 1 TABLET (500 MG TOTAL) BY MOUTH DAILY.  Modified Medications   No medications on file  Discontinued Medications   BENZOYL PEROXIDE-ERYTHROMYCIN (BENZAMYCIN) GEL    Apply topically 2 (two) times daily.   CLINDAMYCIN (CLEOCIN T) 1 % LOTION    APPLY TOPICALLY TWO TIMES DAILY.   DOXYCYCLINE (VIBRA-TABS) 100 MG TABLET    Take 1 tablet (100 mg total) by mouth 2 (two) times daily.   FLUTICASONE FUROATE (ARNUITY ELLIPTA) 200 MCG/ACT AEPB    Inhale into the lungs. Reported on 01/09/2016   LEVOFLOXACIN (LEVAQUIN) 500 MG TABLET    Take 1 tablet (500 mg total) by mouth daily.   MONTELUKAST (SINGULAIR) 10 MG TABLET    Take 1 tablet (10 mg total) by mouth at bedtime.    Subjective: [name] is seen on a work in basis. 3 days ago she developed sinus congestion, frontal headache and cough productive of yellow sputum, shortness of breath and increased wheezing. She's had subjective fever and chills. She lost her Medicaid and has been out of her Qvar inhaler for one month. She has been using her pro-air inhaler more frequently recently.  She has not had any trouble getting her Genvoya or Prezista. She denies missing any doses.  Review of Systems: Review of Systems  Constitutional: Positive for chills and fever. Negative for diaphoresis, malaise/fatigue and weight loss.  HENT: Positive for congestion. Negative for sore throat.   Respiratory: Positive for cough, sputum production, shortness of breath and wheezing.   Cardiovascular: Negative for chest pain.  Gastrointestinal: Negative for diarrhea, nausea and vomiting.  Genitourinary: Negative for dysuria and frequency.  Musculoskeletal: Negative  for joint pain and myalgias.  Skin: Negative for rash.  Neurological: Positive for headaches. Negative for dizziness.  Psychiatric/Behavioral: Negative for depression and substance abuse. The patient is not nervous/anxious.     Past Medical History:  Diagnosis Date  . Allergy   . Anemia   . Anxiety   . Anxiety state, unspecified 12/07/2012  . Arthritis   . Asthma   . Bipolar disorder, unspecified ([place]) 12/07/2012  . Depression   . Diabetes mellitus without complication ([place])    no meds currently- controlled with diet  . GERD (gastroesophageal reflux disease)   . GERD (gastroesophageal reflux disease) 12/07/2012  . Headache(784.0)    migraines  . Herpes genitalis in women 12/07/2012  . HIV infection ([place])   . Human immunodeficiency virus (HIV) disease ([place]) 12/07/2012  . 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 Type II or unspecified type diabetes mellitus without mention of complication, not stated as uncontrolled 12/07/2012  . Unspecified asthma(493.90) 12/07/2012    Social History  Substance Use Topics  . Smoking status: Never Smoker  . Smokeless tobacco: Never Used  . Alcohol use 0.0 oz/week     Comment: OCC.    Family History  Problem Relation Age of Onset  . Arthritis/Rheumatoid Mother   . Hypertension Mother   . Cancer Maternal Aunt     brand and lung  . Cancer Maternal Grandmother     breasts  . Cancer Paternal Grandmother     breast and kidney    Allergies  Allergen Reactions  . Benzoin Other (See Comments)    blisters  . Latex Rash and Other (See Comments)  . Sulfamethoxazole-Trimethoprim Itching and Nausea And Vomiting    Objective:  Vitals:   06/21/16 1351  BP: 120/82  Pulse: 76  Weight: 191 lb (86.6 kg)  Height: 5\' 4"  (1.626 m)   Body mass index is 32.79 kg/m.  Physical Exam  Constitutional: She is oriented to person, place, and time.  She looks uncomfortable and tired.  HENT:  Mouth/Throat: No oropharyngeal exudate.   Eyes: Conjunctivae are normal.  Cardiovascular: Normal rate and regular rhythm.   No murmur heard. Pulmonary/Chest: Effort normal and breath sounds normal. She has no wheezes. She has no rales.  Frequent dry cough during exam.  Abdominal: Soft. She exhibits no mass. There is no tenderness.  Musculoskeletal: Normal range of motion.  Neurological: She is alert and oriented to person, place, and time.  Skin: No rash noted.  Psychiatric: Mood and affect normal.    Lab Results Lab Results  Component Value Date   WBC 7.7 05/18/2016   HGB 13.2 05/18/2016   HCT 38.6 05/18/2016   MCV 95.5 05/18/2016   PLT 286 05/18/2016    Lab Results  Component Value Date   CREATININE 1.15 (H) 05/18/2016   BUN 12 05/18/2016   NA 137 05/18/2016   K 4.2 05/18/2016   CL 104 05/18/2016   CO2 24 05/18/2016    Lab  Results  Component Value Date   ALT 16 05/18/2016   AST 18 05/18/2016   ALKPHOS 62 05/18/2016   BILITOT 0.6 05/18/2016    Lab Results  Component Value Date   CHOL 226 (H) 05/18/2016   HDL 39 (L) 05/18/2016   LDLCALC 171 (H) 05/18/2016   TRIG 82 05/18/2016   CHOLHDL 5.8 (H) 05/18/2016   HIV 1 RNA Quant (copies/mL)  Date Value  05/18/2016 <20  11/05/2015 37 (H)  07/02/2015 <20   CD4 T Cell Abs (/uL)  Date Value  05/18/2016 1,300  11/05/2015 1,520  07/02/2015 1,200     Problem List Items Addressed This Visit      High   Human immunodeficiency virus (HIV) disease ([place])    Her adherence is very good and her infection remains under very good control. She will follow-up at her routine, scheduled visit to Dr. Johnnye Sima.        Unprioritized   Acute upper respiratory infection    She has an acute upper respiratory infection. I will have her use generic Afrin nasal spray to help relieve her congestion and headache and reduce postnasal drip and coughing. She is currently in no respiratory distress and not wheezing.      ASTHMA, COUGH VARIANT    She reports increased wheezing  recently but has none on exam today. She is unable to obtain her Qvar inhaler and is almost out of pro-air and will have trouble getting it since she has lost her Medicaid. I will have her meet with our medication financial counselor today.           Michel Bickers, MD [facility] [phone] pager   [phone] cell 06/21/2016, 2:10 PM

## 2016-06-21 NOTE — Addendum Note (Signed)
Addended by: [name] A on: 06/21/2016 02:54 PM   Modules accepted: Orders

## 2016-06-21 NOTE — Patient Instructions (Signed)
Use generic Afrin nasal spray 2 puffs in each nostril 2-3 times daily for the next 3 days.

## 2016-07-01 ENCOUNTER — Ambulatory Visit (INDEPENDENT_AMBULATORY_CARE_PROVIDER_SITE_OTHER): Payer: Self-pay | Admitting: Infectious Diseases

## 2016-07-01 ENCOUNTER — Encounter: Payer: Self-pay | Admitting: Infectious Diseases

## 2016-07-01 VITALS — BP 118/81 | HR 76 | Temp 97.9°F | Ht 64.0 in | Wt 195.0 lb

## 2016-07-01 DIAGNOSIS — Z113 Encounter for screening for infections with a predominantly sexual mode of transmission: Secondary | ICD-10-CM

## 2016-07-01 DIAGNOSIS — Z79899 Other long term (current) drug therapy: Secondary | ICD-10-CM

## 2016-07-01 DIAGNOSIS — J069 Acute upper respiratory infection, unspecified: Secondary | ICD-10-CM

## 2016-07-01 DIAGNOSIS — B20 Human immunodeficiency virus [HIV] disease: Secondary | ICD-10-CM

## 2016-07-01 DIAGNOSIS — R87612 Low grade squamous intraepithelial lesion on cytologic smear of cervix (LGSIL): Secondary | ICD-10-CM

## 2016-07-01 MED ORDER — AZITHROMYCIN 250 MG PO TABS: ORAL_TABLET | ORAL | 0 refills | Status: DC

## 2016-07-01 NOTE — Progress Notes (Signed)
   Subjective:    Patient ID: [name], female    DOB: 09-17-1980, 35 y.o.   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 States she now has small lesion on her L ovary and does not want to be cut on right now.  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 Is due for colpo on 11-28 but has no insurance.   HIV 1 RNA Quant (copies/mL)  Date Value  05/18/2016 <20  11/05/2015 37 (H)  07/02/2015 <20   CD4 T Cell Abs (/uL)  Date Value  05/18/2016 1,300  11/05/2015 1,520  07/02/2015 1,200    Was seen 1 week ago with sinus issues. Was sent home with dx of URI, off anbx She comes back today with continued sinus headache, unable to sleep.  Has started allegra in the last week. Has tried afrin as well with some relief.   Has rx for amitiza for IBS but can't afford.   Review of Systems  Constitutional: Negative for unexpected weight change.  Gastrointestinal: Positive for abdominal pain. Negative for constipation and diarrhea.  Genitourinary: Negative for difficulty urinating.   Is trying to eat healthy.     Objective:   Physical Exam  Constitutional: She appears well-developed and well-nourished.  HENT:  Head:    Mouth/Throat: No oropharyngeal exudate.  Eyes: EOM are normal. Pupils are equal, round, and reactive to light.  Neck: Neck supple.  Cardiovascular: Normal rate, regular rhythm and normal heart sounds.   Pulmonary/Chest: Effort normal and breath sounds normal.  Abdominal: Soft. Bowel sounds are normal. There is no tenderness. There is no rebound.  Musculoskeletal: She exhibits no edema.  Lymphadenopathy:    She has no cervical adenopathy.       Assessment & Plan:

## 2016-07-01 NOTE — Assessment & Plan Note (Signed)
Will give her rx for z-pack, watch response Asked her not to use afrin m ore than 1 week.  Starting allegra-d

## 2016-07-01 NOTE — Assessment & Plan Note (Signed)
She will have colpo on 11-28 Will ask pt assistance to help her with this.

## 2016-07-01 NOTE — Assessment & Plan Note (Signed)
She is doing well on her current rx Offered/refused condoms.  Will see her back in 6 months She will get mening vax at f/u She has gotten flu.

## 2016-07-13 ENCOUNTER — Encounter: Payer: Self-pay | Admitting: Obstetrics and Gynecology

## 2016-08-03 ENCOUNTER — Other Ambulatory Visit: Payer: Self-pay | Admitting: *Deleted

## 2016-08-03 MED ORDER — FLUCONAZOLE 150 MG PO TABS: 150.0000 mg | ORAL_TABLET | Freq: Every day | ORAL | 3 refills | Status: DC

## 2016-08-12 ENCOUNTER — Other Ambulatory Visit: Payer: Self-pay | Admitting: Internal Medicine

## 2016-08-12 DIAGNOSIS — B20 Human immunodeficiency virus [HIV] disease: Secondary | ICD-10-CM

## 2016-08-12 DIAGNOSIS — B009 Herpesviral infection, unspecified: Secondary | ICD-10-CM

## 2016-08-13 ENCOUNTER — Other Ambulatory Visit (HOSPITAL_COMMUNITY)
Admission: RE | Admit: 2016-08-13 | Discharge: 2016-08-13 | Disposition: A | Discharge status: Home / Self Care | Payer: BC Managed Care – PPO | Source: Ambulatory Visit | Attending: Infectious Disease | Admitting: Infectious Disease

## 2016-08-13 ENCOUNTER — Ambulatory Visit (INDEPENDENT_AMBULATORY_CARE_PROVIDER_SITE_OTHER): Payer: BC Managed Care – PPO | Admitting: *Deleted

## 2016-08-13 DIAGNOSIS — Z01411 Encounter for gynecological examination (general) (routine) with abnormal findings: Secondary | ICD-10-CM | POA: Diagnosis not present

## 2016-08-13 DIAGNOSIS — Z124 Encounter for screening for malignant neoplasm of cervix: Secondary | ICD-10-CM

## 2016-08-13 DIAGNOSIS — Z113 Encounter for screening for infections with a predominantly sexual mode of transmission: Secondary | ICD-10-CM

## 2016-08-13 DIAGNOSIS — Z01419 Encounter for gynecological examination (general) (routine) without abnormal findings: Secondary | ICD-10-CM | POA: Diagnosis not present

## 2016-08-13 NOTE — Progress Notes (Signed)
Subjective:     [name] is a 35 y.o. woman who comes in today for a  pap smear only.  Previous abnormal Pap smears: no. Contraception: Nexplanon, condoms.   Objective:    There were no vitals taken for this visit. Pelvic Exam:  Pap smear obtained.   Assessment:    Screening pap smear.   Plan:    Follow up in one year, or as indicated by Pap results.  Pt given educational materials re: HIV and women, self-esteem, BSE, nutrition and diet management, PAP smears and partner safety. Patient given condoms.

## 2016-08-13 NOTE — Patient Instructions (Signed)
Your Results will be ready in about a week.  Look for them on MyCharts.  Thank you for coming to the Center for your care.  Langley Gauss, RN

## 2016-08-17 LAB — CERVICOVAGINAL ANCILLARY ONLY
Bacterial vaginitis: NEGATIVE
CHLAMYDIA, DNA PROBE: NEGATIVE
Neisseria Gonorrhea: NEGATIVE
TRICH (WINDOWPATH): NEGATIVE

## 2016-08-18 LAB — CYTOLOGY - PAP

## 2016-08-20 ENCOUNTER — Telehealth: Payer: Self-pay | Admitting: *Deleted

## 2016-08-20 NOTE — Telephone Encounter (Signed)
Patient called for pap smear results. Showed abnormal and advised I would route to MD. She said it is always abnormal and she has had a colposcopy which has come back ok. Please advise

## 2016-08-23 ENCOUNTER — Other Ambulatory Visit: Payer: Self-pay | Admitting: Internal Medicine

## 2016-08-23 ENCOUNTER — Telehealth: Payer: Self-pay | Admitting: *Deleted

## 2016-08-23 DIAGNOSIS — R87610 Atypical squamous cells of undetermined significance on cytologic smear of cervix (ASC-US): Secondary | ICD-10-CM

## 2016-08-23 NOTE — Telephone Encounter (Signed)
-----   Message from Thayer Headings, MD sent at 08/18/2016  4:14 PM EST ----- Can you get her to [facility] for abnormal Pap? thanks

## 2016-08-23 NOTE — Telephone Encounter (Signed)
Referral has been sent to [facility] for abnormal pap smear. [name]

## 2016-08-23 NOTE — Telephone Encounter (Signed)
She needs a GYN appt thanks

## 2016-08-24 NOTE — Telephone Encounter (Signed)
I sent the referral to [facility] clinic.

## 2016-08-26 ENCOUNTER — Encounter: Payer: Self-pay | Admitting: Family Medicine

## 2016-08-27 ENCOUNTER — Other Ambulatory Visit: Payer: Self-pay | Admitting: *Deleted

## 2016-08-27 DIAGNOSIS — B009 Herpesviral infection, unspecified: Secondary | ICD-10-CM

## 2016-08-27 DIAGNOSIS — B20 Human immunodeficiency virus [HIV] disease: Secondary | ICD-10-CM

## 2016-08-27 MED ORDER — ELVITEG-COBIC-EMTRICIT-TENOFAF 150-150-200-10 MG PO TABS: 1.0000 | ORAL_TABLET | Freq: Every day | ORAL | 11 refills | Status: DC

## 2016-08-30 ENCOUNTER — Other Ambulatory Visit: Payer: Self-pay | Admitting: Family Medicine

## 2016-09-02 ENCOUNTER — Ambulatory Visit: Payer: Self-pay | Admitting: Family Medicine

## 2016-09-13 ENCOUNTER — Encounter: Payer: Self-pay | Admitting: Family Medicine

## 2016-09-13 ENCOUNTER — Ambulatory Visit (INDEPENDENT_AMBULATORY_CARE_PROVIDER_SITE_OTHER): Payer: BC Managed Care – PPO | Admitting: Family Medicine

## 2016-09-13 VITALS — BP 119/74 | HR 71 | Temp 98.2°F | Resp 16 | Ht 64.0 in | Wt 199.0 lb

## 2016-09-13 DIAGNOSIS — G43809 Other migraine, not intractable, without status migrainosus: Secondary | ICD-10-CM

## 2016-09-13 DIAGNOSIS — L309 Dermatitis, unspecified: Secondary | ICD-10-CM | POA: Diagnosis not present

## 2016-09-13 DIAGNOSIS — R635 Abnormal weight gain: Secondary | ICD-10-CM | POA: Diagnosis not present

## 2016-09-13 LAB — TSH: TSH: 1.29 m[IU]/L

## 2016-09-13 MED ORDER — TRIAMCINOLONE ACETONIDE 0.5 % EX CREA: 1.0000 "application " | TOPICAL_CREAM | Freq: Three times a day (TID) | CUTANEOUS | 0 refills | Status: DC

## 2016-09-13 MED ORDER — BUTALBITAL-ASPIRIN-CAFFEINE 50-325-40 MG PO CAPS: 1.0000 | ORAL_CAPSULE | Freq: Four times a day (QID) | ORAL | 0 refills | Status: DC | PRN

## 2016-09-13 NOTE — Progress Notes (Signed)
Subjective:    Patient ID: [name], female    DOB: [birth year], 36 y.o.   MRN: [id number]  Headache   This is a chronic problem. The current episode started more than 1 year ago. The problem occurs daily. The pain is located in the bilateral region. The pain quality is not similar to prior headaches. The quality of the pain is described as aching and dull. The pain is at a severity of 7/10. The pain is moderate. Associated symptoms include muscle aches. Pertinent negatives include no fever, hearing loss, insomnia, photophobia, rhinorrhea, sore throat or vomiting. The symptoms are aggravated by activity, bright light and exposure to cold air.  Rash  This is a recurrent (Patient has a history of eczema) problem. The current episode started more than 1 year ago. The rash is diffuse. The rash is characterized by itchiness and dryness. She was exposed to nothing. Pertinent negatives include no fatigue, fever, rhinorrhea, sore throat or vomiting. Past treatments include topical steroids. The treatment provided mild relief. Her past medical history is significant for allergies and eczema.    Past Medical History:  Diagnosis Date  . Allergy   . Anemia   . Anxiety   . Anxiety state, unspecified 12/07/2012  . Arthritis   . Asthma   . Bipolar disorder, unspecified 12/07/2012  . Depression   . Diabetes mellitus without complication ([place])    no meds currently- controlled with diet  . GERD (gastroesophageal reflux disease)   . GERD (gastroesophageal reflux disease) 12/07/2012  . Headache(784.0)    migraines  . Herpes genitalis in women 12/07/2012  . HIV infection ([place])   . Human immunodeficiency virus (HIV) disease ([place]) 12/07/2012  . 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 Type II or unspecified type diabetes mellitus without mention of complication, not stated as uncontrolled 12/07/2012  . Unspecified asthma(493.90) 12/07/2012   Review of Systems  Constitutional:  Negative.  Negative for fatigue and fever.  HENT: Negative.  Negative for hearing loss, rhinorrhea and sore throat.   Eyes: Negative.  Negative for photophobia.  Respiratory: Negative.   Cardiovascular: Negative.  Negative for chest pain and leg swelling.  Gastrointestinal: Negative for vomiting.  Endocrine: Negative for polydipsia, polyphagia and polyuria.  Musculoskeletal: Negative.   Skin: Positive for rash.  Allergic/Immunologic: Positive for immunocompromised state (HIV +).  Neurological: Positive for headaches.  Hematological: Negative.   Psychiatric/Behavioral: Negative.  The patient does not have insomnia.        Objective:   Physical Exam  Constitutional: She is oriented to person, place, and time. She appears well-developed and well-nourished.  HENT:  Head: Normocephalic and atraumatic.  Right Ear: External ear normal.  Left Ear: External ear normal.  Nose: Nose normal.  Mouth/Throat: Oropharynx is clear and moist.  Eyes: Conjunctivae and EOM are normal. Pupils are equal, round, and reactive to light.  Neck: Normal range of motion. Neck supple.  Cardiovascular: Normal rate and normal heart sounds.   Pulmonary/Chest: Effort normal and breath sounds normal.  Abdominal: Soft. Bowel sounds are normal.  Musculoskeletal: Normal range of motion.  Neurological: She is alert and oriented to person, place, and time. She has normal reflexes.  Skin: Skin is warm and dry.  Psychiatric: She has a normal mood and affect. Her behavior is normal. Judgment and thought content normal.      BP 119/74 (BP Location: Right Arm, Patient Position: Sitting, Cuff Size: Large)   Pulse 71  Temp 98.2 F (36.8 C) (Oral)   Resp 16   Ht 5\' 4"  (1.626 m)   Wt 199 lb (90.3 kg)   LMP 08/30/2016   SpO2 100%   BMI 34.16 kg/m  Assessment & Plan:  1. Other migraine without status migrainosus, not intractable Patient reports that she has had migraine headaches since she was 36 years old. She is was  taking excedrin migraine without sustained relief. Will start a trial of Fiorinol every 6 hours as needed for migraines. Recommend that patient maintain a headache diary. Will follow up in 1 month. If there is no improvement, will send a referral to neurology for further workup and evaluation.  - butalbital-aspirin-caffeine (FIORINAL) 50-325-40 MG capsule; Take 1 capsule by mouth every 6 (six) hours as needed for headache.  Dispense: 30 capsule; Refill: 0  2. Weight gain Recommend a lowfat, low carbohydrate diet divided over 5-6 small meals, increase water intake to 6-8 glasses, and 150 minutes per week of cardiovascular exercise.   - TSH  3. Eczema, unspecified type Recommend Dove Unscented and Aveeno Oatmeal lotion.  - triamcinolone cream (KENALOG) 0.5 %; Apply 1 application topically 3 (three) times daily.  Dispense: 30 g; Refill: 0   4. Human Immunodeficiency Virus Follow up with Dr. Johnnye Sima as scheduled.    RTC: 1 month for headaches   Jolynne Spurgin M, FNP

## 2016-09-13 NOTE — Patient Instructions (Addendum)
Maintain a headache diary.   Contact Dermatitis Introduction Dermatitis is redness, soreness, and swelling (inflammation) of the skin. Contact dermatitis is a reaction to certain substances that touch the skin. You either touched something that irritated your skin, or you have allergies to something you touched. Follow these instructions at home: [place] your skin as needed.  Apply cool compresses to the affected areas.  Try taking a bath with:  Epsom salts. Follow the instructions on the package. You can get these at a pharmacy or grocery store.  Baking soda. Pour a small amount into the bath as told by your doctor.  Colloidal oatmeal. Follow the instructions on the package. You can get this at a pharmacy or grocery store.  Try applying baking soda paste to your skin. Stir water into baking soda until it looks like paste.  Do not scratch your skin.  Bathe less often.  Bathe in lukewarm water. Avoid using hot water. Medicines  Take or apply over-the-counter and prescription medicines only as told by your doctor.  If you were prescribed an antibiotic medicine, take or apply your antibiotic as told by your doctor. Do not stop taking the antibiotic even if your condition starts to get better. General instructions  Keep all follow-up visits as told by your doctor. This is important.  Avoid the substance that caused your reaction. If you do not know what caused it, keep a journal to try to track what caused it. Write down:  What you eat.  What cosmetic products you use.  What you drink.  What you wear in the affected area. This includes jewelry.  If you were given a bandage (dressing), take care of it as told by your doctor. This includes when to change and remove it. Contact a doctor if:  You do not get better with treatment.  Your condition gets worse.  You have signs of infection such  as:  Swelling.  Tenderness.  Redness.  Soreness.  Warmth.  You have a fever.  You have new symptoms. Get help right away if:  You have a very bad headache.  You have neck pain.  Your neck is stiff.  You throw up (vomit).  You feel very sleepy.  You see red streaks coming from the affected area.  Your bone or joint underneath the affected area becomes painful after the skin has healed.  The affected area turns darker.  You have trouble breathing. This information is not intended to replace advice given to you by your health care provider. Make sure you discuss any questions you have with your health care provider. Document Released: 05/30/2009 Document Revised: 01/08/2016 Document Reviewed: 12/18/2014  2017 Elsevier  Recurrent Migraine Headache A migraine headache is very bad, throbbing pain on one or both sides of your head. Recurrent migraines keep coming back. Talk to your doctor about what things may bring on (trigger) your migraine headaches. Follow these instructions at home:  Only take medicines as told by your doctor.  Lie down in a dark, quiet room when you have a migraine.  Keep a journal to find out if certain things bring on migraine headaches. For example, write down:  What you eat and drink.  How much sleep you get.  Any change to your diet or medicines.  Lessen how much alcohol you drink.  Quit smoking if you smoke.  Get enough sleep.  Lessen any stress in your life.  Keep lights dim if bright lights bother you or make  your migraines worse. Contact a doctor if:  Medicine does not help your migraines.  Your pain keeps coming back.  You have a fever. Get help right away if:  Your migraine becomes really bad.  You have a stiff neck.  You have trouble seeing.  Your muscles are weak, or you lose muscle control.  You lose your balance or have trouble walking.  You feel like you will pass out (faint), or you pass out.  You  have really bad symptoms that are different than your first symptoms. This information is not intended to replace advice given to you by your health care provider. Make sure you discuss any questions you have with your health care provider. Document Released: 05/11/2008 Document Revised: 01/08/2016 Document Reviewed: 04/09/2013 Elsevier Interactive Patient Education  2017 [employer].  Migraine Headache A migraine headache is an intense, throbbing pain on one side or both sides of the head. Migraines may also cause other symptoms, such as nausea, vomiting, and sensitivity to light and noise. What are the causes? Doing or taking certain things may also trigger migraines, such as:  Alcohol.  Smoking.  Medicines, such as:  Medicine used to treat chest pain (nitroglycerine).  Birth control pills.  Estrogen pills.  Certain blood pressure medicines.  Aged cheeses, chocolate, or caffeine.  Foods or drinks that contain nitrates, glutamate, aspartame, or tyramine.  Physical activity. Other things that may trigger a migraine include:  Menstruation.  Pregnancy.  Hunger.  Stress, lack of sleep, too much sleep, or fatigue.  Weather changes. What increases the risk? The following factors may make you more likely to experience migraine headaches:  Age. Risk increases with age.  Family history of migraine headaches.  Being Caucasian.  Depression and anxiety.  Obesity.  Being a woman.  Having a hole in the heart (patent foramen ovale) or other heart problems. What are the signs or symptoms? The main symptom of this condition is pulsating or throbbing pain. Pain may:  Happen in any area of the head, such as on one side or both sides.  Interfere with daily activities.  Get worse with physical activity.  Get worse with exposure to bright lights or loud noises. Other symptoms may include:  Nausea.  Vomiting.  Dizziness.  General sensitivity to bright lights,  loud noises, or smells. Before you get a migraine, you may get warning signs that a migraine is developing (aura). An aura may include:  Seeing flashing lights or having blind spots.  Seeing bright spots, halos, or zigzag lines.  Having tunnel vision or blurred vision.  Having numbness or a tingling feeling.  Having trouble talking.  Having muscle weakness. How is this diagnosed? A migraine headache can be diagnosed based on:  Your symptoms.  A physical exam.  Tests, such as CT scan or MRI of the head. These imaging tests can help rule out other causes of headaches.  Taking fluid from the spine (lumbar puncture) and analyzing it (cerebrospinal fluid analysis, or CSF analysis). How is this treated? A migraine headache is usually treated with medicines that:  Relieve pain.  Relieve nausea.  Prevent migraines from coming back. Treatment may also include:  Acupuncture.  Lifestyle changes like avoiding foods that trigger migraines. Follow these instructions at home: Medicines  Take over-the-counter and prescription medicines only as told by your health care provider.  Do not drive or use heavy machinery while taking prescription pain medicine.  To prevent or treat constipation while you are taking prescription pain medicine,  your health care provider may recommend that you:  Drink enough fluid to keep your urine clear or pale yellow.  Take over-the-counter or prescription medicines.  Eat foods that are high in fiber, such as fresh fruits and vegetables, whole grains, and beans.  Limit foods that are high in fat and processed sugars, such as fried and sweet foods. Lifestyle  Avoid alcohol use.  Do not use any products that contain nicotine or tobacco, such as cigarettes and e-cigarettes. If you need help quitting, ask your health care provider.  Get at least 8 hours of sleep every night.  Limit your stress. General instructions  Keep a journal to find out  what may trigger your migraine headaches. For example, write down:  What you eat and drink.  How much sleep you get.  Any change to your diet or medicines.  If you have a migraine:  Avoid things that make your symptoms worse, such as bright lights.  It may help to lie down in a dark, quiet room.  Do not drive or use heavy machinery.  Ask your health care provider what activities are safe for you while you are experiencing symptoms.  Keep all follow-up visits as told by your health care provider. This is important. Contact a health care provider if:  You develop symptoms that are different or more severe than your usual migraine symptoms. Get help right away if:  Your migraine becomes severe.  You have a fever.  You have a stiff neck.  You have vision loss.  Your muscles feel weak or like you cannot control them.  You start to lose your balance often.  You develop trouble walking.  You faint. This information is not intended to replace advice given to you by your health care provider. Make sure you discuss any questions you have with your health care provider. Document Released: 08/02/2005 Document Revised: 02/20/2016 Document Reviewed: 01/19/2016 Elsevier Interactive Patient Education  2017 [employer].

## 2016-09-24 ENCOUNTER — Ambulatory Visit (INDEPENDENT_AMBULATORY_CARE_PROVIDER_SITE_OTHER): Payer: BC Managed Care – PPO | Admitting: Family Medicine

## 2016-09-24 ENCOUNTER — Encounter: Payer: Self-pay | Admitting: Family Medicine

## 2016-09-24 ENCOUNTER — Other Ambulatory Visit (HOSPITAL_COMMUNITY)
Admission: RE | Admit: 2016-09-24 | Discharge: 2016-09-24 | Disposition: A | Discharge status: Home / Self Care | Payer: BC Managed Care – PPO | Source: Ambulatory Visit | Attending: Family Medicine | Admitting: Family Medicine

## 2016-09-24 VITALS — BP 108/65 | HR 76

## 2016-09-24 DIAGNOSIS — Z3202 Encounter for pregnancy test, result negative: Secondary | ICD-10-CM | POA: Diagnosis not present

## 2016-09-24 DIAGNOSIS — R87612 Low grade squamous intraepithelial lesion on cytologic smear of cervix (LGSIL): Secondary | ICD-10-CM

## 2016-09-24 DIAGNOSIS — N870 Mild cervical dysplasia: Secondary | ICD-10-CM | POA: Insufficient documentation

## 2016-09-24 LAB — POCT PREGNANCY, URINE: PREG TEST UR: NEGATIVE

## 2016-09-24 NOTE — Progress Notes (Signed)
Colposcopy Note:  PAP: LSIL Since 2014, pt has had LSIL on PAP with CIN1 on colposcopy.  Patient given informed consent, signed copy in the chart, time out was performed.  Placed in lithotomy position. Cervix viewed with speculum and colposcope after application of acetic acid.   Colposcopy adequate?  yes [place]et[place]hite lesions? 4, 8 Punctation? no Mosaicism?  no Abnormal vasculature?  no Biopsies? yes ECC? yes   CIN1 on appearance. Patient was given post procedure instructions.  Will call patient with results. Would recommend cryotherapy if still CIN1

## 2016-09-24 NOTE — Patient Instructions (Signed)

## 2016-09-24 NOTE — Addendum Note (Signed)
Addended by: Truett Mainland on: 09/24/2016 11:00 AM   Modules accepted: Orders

## 2016-09-27 ENCOUNTER — Encounter: Payer: Self-pay | Admitting: Family Medicine

## 2016-09-27 ENCOUNTER — Ambulatory Visit (INDEPENDENT_AMBULATORY_CARE_PROVIDER_SITE_OTHER): Payer: BC Managed Care – PPO | Admitting: Family Medicine

## 2016-09-27 VITALS — BP 109/68 | HR 78 | Temp 98.0°F | Resp 14 | Ht 64.0 in | Wt 189.0 lb

## 2016-09-27 DIAGNOSIS — J019 Acute sinusitis, unspecified: Secondary | ICD-10-CM | POA: Diagnosis not present

## 2016-09-27 MED ORDER — AMOXICILLIN-POT CLAVULANATE 500-125 MG PO TABS: 1.0000 | ORAL_TABLET | Freq: Three times a day (TID) | ORAL | 0 refills | Status: DC

## 2016-09-27 NOTE — Patient Instructions (Signed)
Lots of liquids. Afrin for 3-5 days.  May return to work on Wednesday.

## 2016-09-27 NOTE — Progress Notes (Signed)
[name], is a 36 y.o. female  [id number]  [id number]  DOB - May 05, 1981  CC:  Chief Complaint  Patient presents with  . Headache    pain in face and head started 1 week ago and has progressed  . Cough    started this morning  . Nasal Congestion    for 1 week, did receive flu vaccine  . leg pain    left leg pain starts lower and radiates up, dull , shooting pain, has had off and on but it seems worse the past couple of days  . Medication Refill    valtrex and allegra       HPI: [name] is a 36 y.o. female here for acute visit due to nasal and sinus congestion. This congestion stared about a week ago and had worsened. She complains of facial and eye pressure, which is worse with bending over.  She does have a history of occ sinus infections.She denies chills, fever, ST.   Allergies  Allergen Reactions  . Benzoin Other (See Comments)    blisters  . Latex Rash and Other (See Comments)  . Sulfamethoxazole-Trimethoprim Itching and Nausea And Vomiting   Past Medical History:  Diagnosis Date  . Allergy   . Anemia   . Anxiety   . Anxiety state, unspecified 12/07/2012  . Arthritis   . Asthma   . Bipolar disorder, unspecified 12/07/2012  . Depression   . Diabetes mellitus without complication ([place])    no meds currently- controlled with diet  . GERD (gastroesophageal reflux disease)   . GERD (gastroesophageal reflux disease) 12/07/2012  . Headache(784.0)    migraines  . Herpes genitalis in women 12/07/2012  . HIV infection ([place])   . Human immunodeficiency virus (HIV) disease ([place]) 12/07/2012  . Neuromuscular disorder ([place])    carpal tunnel in both arms  . Type II or unspecified type diabetes mellitus without mention of complication, not stated as uncontrolled 12/07/2012  . Unspecified asthma(493.90) 12/07/2012   Current Outpatient Prescriptions on File Prior to Visit  Medication Sig Dispense Refill  . butalbital-aspirin-caffeine (FIORINAL) 50-325-40 MG  capsule Take 1 capsule by mouth every 6 (six) hours as needed for headache. 30 capsule 0  . elvitegravir-cobicistat-emtricitabine-tenofovir (GENVOYA) 150-150-200-10 MG TABS tablet Take 1 tablet by mouth daily with breakfast. 30 tablet 11  . fexofenadine (ALLEGRA ALLERGY) 180 MG tablet Take 1 tablet (180 mg total) by mouth daily. 90 tablet 1  . lubiprostone (AMITIZA) 8 MCG capsule Take 8 mcg by mouth 2 (two) times daily with a meal.    . montelukast (SINGULAIR) 10 MG tablet Take 1 tablet (10 mg total) by mouth at bedtime. 30 tablet 5  . oxymetazoline (AFRIN) 0.05 % nasal spray Place 1 spray into both nostrils 2 (two) times daily.    Marland Kitchen PREZISTA 800 MG tablet TAKE 1 TABLET(800 MG) BY MOUTH DAILY 30 tablet 5  . PROAIR HFA 108 (90 Base) MCG/ACT inhaler INHALE TWO PUFFS INTO THE LUNGS EVERY 6 HOURS AS NEEDED FOR WHEEZING. 8.5 Inhaler 2  . QVAR 40 MCG/ACT inhaler INHALE TWO PUFFS INTO THE LUNGS TWO TIMES DAILY. 1 Inhaler 3  . triamcinolone cream (KENALOG) 0.5 % Apply 1 application topically 3 (three) times daily. 30 g 0  . valACYclovir (VALTREX) 500 MG tablet TAKE 1 TABLET (500 MG TOTAL) BY MOUTH DAILY. 30 tablet 5   No current facility-administered medications on file prior to visit.    Family History  Problem Relation Age of Onset  .  Arthritis/Rheumatoid Mother   . Hypertension Mother   . Cancer Maternal Aunt     brand and lung  . Cancer Maternal Grandmother     breasts  . Cancer Paternal Grandmother     breast and kidney   Social History   Social History  . Marital status: Single    Spouse name: N/A  . Number of children: 1  . Years of education: 62   Occupational History  . Not on file.   Social History Main Topics  . Smoking status: Never Smoker  . Smokeless tobacco: Never Used  . Alcohol use No     Comment: OCC.  . Drug use: No  . Sexual activity: Yes    Partners: Male    Birth control/ protection: Implant   Other Topics Concern  . Not on file   Social History  Narrative   Patient lives alone.   Patient works at home.   Patient has hs education.   Patient has 1 child.   Patient drinks caffeine once in a while.    Review of Systems: Constitutional: Negative Skin: Negative HENT: + ear pressure bilaterally, + for nasal and sinus congestion Eyes+ for irritation and pressure in eyes Respiratory: + for cough Neck: Negative Cardiovascular: Negative Gastrointestinal: Negative Genitourinary: Negative  Musculoskeletal: Negative   Neurological + for headache Hematological: Negative  Psychiatric/Behavioral: Negative    Objective:   Vitals:   09/27/16 0932  BP: 109/68  Pulse: 78  Resp: 14  Temp: 98 F (36.7 C)    Physical Exam: Constitutional: Patient appears well-developed and well-nourished. No distress. HENT: Normocephalic, atraumatic, External right and left ear normal. Oropharynx is clear and moist. Facial tenderness present over maxillary sinuses. Eye EOM are normal. PERRLA, no scleral icterus. Conjunctiva reddened Neck: Normal ROM. Neck supple. No lymphadenopathy, No thyromegaly. CVS: RRR, S1/S2 +, no murmurs, no gallops, no rubs Pulmonary: Effort and breath sounds normal, no stridor, rhonchi, wheezes, rales.  Abdominal: Soft. Normoactive BS,, no distension, tenderness, rebound or guarding.  Musculoskeletal: Normal range of motion. No edema and no tenderness.  Neuro: Alert.Normal muscle tone coordination. Non-focal Skin: Skin is warm and dry. No rash noted. Not diaphoretic. No erythema. No pallor. Psychiatric: Normal mood and affect. Behavior, judgment, thought content normal.  Lab Results  Component Value Date   WBC 7.7 05/18/2016   HGB 13.2 05/18/2016   HCT 38.6 05/18/2016   MCV 95.5 05/18/2016   PLT 286 05/18/2016   Lab Results  Component Value Date   CREATININE 1.15 (H) 05/18/2016   BUN 12 05/18/2016   NA 137 05/18/2016   K 4.2 05/18/2016   CL 104 05/18/2016   CO2 24 05/18/2016    Lab Results  Component Value  Date   HGBA1C 5.2 04/27/2016   Lipid Panel     Component Value Date/Time   CHOL 226 (H) 05/18/2016 1400   TRIG 82 05/18/2016 1400   HDL 39 (L) 05/18/2016 1400   CHOLHDL 5.8 (H) 05/18/2016 1400   VLDL 16 05/18/2016 1400   LDLCALC 171 (H) 05/18/2016 1400        Assessment and plan:   1. Acute sinusitis, recurrence not specified, unspecified location -augmentin 500/125, #20, one po bid for 10 days -lots of liquids -afrin for 3-5 days.    Return if symptoms worsen or fail to improve.  The patient was given clear instructions to go to ER or return to medical center if symptoms don't improve, worsen or new problems develop. The patient verbalized  understanding.    Micheline Chapman FNP  09/27/2016, 10:08 AM

## 2016-09-28 ENCOUNTER — Other Ambulatory Visit: Payer: Self-pay

## 2016-09-28 ENCOUNTER — Encounter: Payer: Self-pay | Admitting: Family Medicine

## 2016-09-28 ENCOUNTER — Other Ambulatory Visit: Payer: Self-pay | Admitting: *Deleted

## 2016-09-28 ENCOUNTER — Telehealth: Payer: Self-pay | Admitting: *Deleted

## 2016-09-28 DIAGNOSIS — B009 Herpesviral infection, unspecified: Secondary | ICD-10-CM

## 2016-09-28 DIAGNOSIS — B20 Human immunodeficiency virus [HIV] disease: Secondary | ICD-10-CM

## 2016-09-28 MED ORDER — DARUNAVIR ETHANOLATE 800 MG PO TABS
ORAL_TABLET | ORAL | 5 refills | Status: DC
Start: 2016-09-28 — End: 2016-09-28

## 2016-09-28 MED ORDER — ELVITEG-COBIC-EMTRICIT-TENOFAF 150-150-200-10 MG PO TABS: 1.0000 | ORAL_TABLET | Freq: Every day | ORAL | 11 refills | Status: DC

## 2016-09-28 MED ORDER — DARUNAVIR ETHANOLATE 800 MG PO TABS
ORAL_TABLET | ORAL | 5 refills | Status: DC
Start: 2016-09-28 — End: 2017-03-07

## 2016-09-28 NOTE — Telephone Encounter (Signed)
Patient called stating pharmacy only has one medication on approved medication.

## 2016-09-28 NOTE — Telephone Encounter (Signed)
Pt left message requesting Colpo results. Per chart review, Dr. Nehemiah Settle sent a message to pt via My Chart regarding these results and his recommendations.

## 2016-10-19 ENCOUNTER — Other Ambulatory Visit: Payer: Self-pay | Admitting: Family Medicine

## 2016-10-25 ENCOUNTER — Ambulatory Visit: Payer: Medicaid Other | Admitting: Family Medicine

## 2016-10-28 ENCOUNTER — Encounter: Payer: Self-pay | Admitting: Family Medicine

## 2016-10-28 ENCOUNTER — Ambulatory Visit (INDEPENDENT_AMBULATORY_CARE_PROVIDER_SITE_OTHER): Payer: BC Managed Care – PPO | Admitting: Family Medicine

## 2016-10-28 VITALS — BP 127/79 | HR 79 | Ht 64.0 in | Wt 193.0 lb

## 2016-10-28 DIAGNOSIS — Z3202 Encounter for pregnancy test, result negative: Secondary | ICD-10-CM | POA: Diagnosis not present

## 2016-10-28 DIAGNOSIS — N870 Mild cervical dysplasia: Secondary | ICD-10-CM

## 2016-10-28 NOTE — Progress Notes (Signed)
Cryotherapy details PAP: LSIL Colposcopy: CIN1.  ECC: negative  The indications for cryotherapy were reviewed with the patient in detail. She was counseled about that efficacy of this procedure, and possible need for excisional procedure in the future if her cervical dysplasia persists.  The risks of the procedure where explained in detail and patient was told to expect a copious amount of discharge in the next few weeks. All her questions were answered, and written informed consent was obtained.  The patient was placed in the dorsal lithotomy position and a vaginal speculum was placed. Her cervix was visualized and patient was noted to have had normal size transformation zone. The appropriate cryotherapy probe was picked and affixed to cryotherapy apparatus. Then nitrogen gas was then activated, the probe was coated with lubricating jelly and applied to the transformation zone of the cervix. This was kept in place for 3 minutes. The cryotherapy was then stopped and all instruments were removed from the patient's pelvis; a thawing period of 3 minutes was observed.  A second cycle of cryotherapy was then administered to the cervix for 3 minutes.  The patient tolerated the procedure well without any complications. Routine post procedure instructions were given to the patient.  Patient is to return to the clinic in 12 months for a repeat Pap smear with cytology. She will need Pap smears every 12 months until she has had at least 2 consecutive normal Paps.

## 2016-10-28 NOTE — Patient Instructions (Signed)
You had cryotherapy done on your cervix:  Please expect:  Cramping over the next 2-3 days.  You may take ibuprofen or tylenol for discomfot.  A thin, grey discharge for the next 2-3 weeks  Please DO NOT place anything in the vagina for 4 weeks. Please DO NOT have sex for 4 weeks.  Please call or return if you have:  Bleeding  Severe pain  Abnormal discharge  Concerns for vaginal infection  Nausea and/or vomiting

## 2016-10-29 LAB — POCT PREGNANCY, URINE: Preg Test, Ur: NEGATIVE

## 2016-11-02 ENCOUNTER — Encounter: Payer: Self-pay | Admitting: Family Medicine

## 2016-11-02 ENCOUNTER — Ambulatory Visit (INDEPENDENT_AMBULATORY_CARE_PROVIDER_SITE_OTHER): Payer: BC Managed Care – PPO | Admitting: Family Medicine

## 2016-11-02 VITALS — BP 122/68 | HR 80 | Temp 97.8°F | Ht 64.0 in | Wt 195.0 lb

## 2016-11-02 DIAGNOSIS — G43809 Other migraine, not intractable, without status migrainosus: Secondary | ICD-10-CM | POA: Diagnosis not present

## 2016-11-02 DIAGNOSIS — R5383 Other fatigue: Secondary | ICD-10-CM

## 2016-11-02 MED ORDER — TOPIRAMATE 25 MG PO TABS: 50.0000 mg | ORAL_TABLET | Freq: Two times a day (BID) | ORAL | 1 refills | Status: DC

## 2016-11-02 MED FILL — TOPIRAMATE 25 MG TAB: 25 | 15 days supply | Qty: 60 | Fill #0

## 2016-11-02 NOTE — Progress Notes (Signed)
Patient ID: [name] [name], female    DOB: [birth year], 36 y.o.   MRN: [id number]  PCP: Sharon Seller, NP  Chief Complaint  Patient presents with  . Follow-up    MIGRAINES  . Fatigue    Subjective:  HPI EVANGELYN CROUSE is a 36 y.o. female presents for migraine follow-up.  Chronic problems include: HIV, Bipolar, Asthma, and GERD. She reports that Fiorinal is stopping the headaches although is not successful in preventing the headaches. Average frequency of headaches 3-4 times per week. Duration varies and has improved with Fiorinal although medication causes drowsiness and fatigue. She has previously been evaluated by neurology and was placed on sumatripitan without success of aborting headaches. Headaches occur unilaterally mostly and or behind the eyes.  Reports aura with nausea with some vomiting and sensitivity to light. Occasionally dizziness.  Fatigue Reports a long-standing history of insomnia and depression. She has attempted to take antidepressants and sleep inducing medication which where either ineffective or caused negative side effects. She have a very hectic schedule and reports an average of 4 hours of sleep most nights. Reports routine daily exercise. She admits to drinking at most two caffeine beverages daily.  Social History   Social History  . Marital status: Single    Spouse name: N/A  . Number of children: 1  . Years of education: 67   Occupational History  . Not on file.   Social History Main Topics  . Smoking status: Never Smoker  . Smokeless tobacco: Never Used  . Alcohol use No     Comment: OCC.  . Drug use: No  . Sexual activity: Yes    Partners: Male    Birth control/ protection: Implant   Other Topics Concern  . Not on file   Social History Narrative   Patient lives alone.   Patient works at home.   Patient has hs education.   Patient has 1 child.   Patient drinks caffeine once in a while.    Family History  Problem Relation  Age of Onset  . Arthritis/Rheumatoid Mother   . Hypertension Mother   . Cancer Maternal Aunt     brand and lung  . Cancer Maternal Grandmother     breasts  . Cancer Paternal Grandmother     breast and kidney   Review of Systems See HPI  Patient Active Problem List   Diagnosis Date Noted  . Acute upper respiratory infection 06/21/2016  . Hyperlipidemia 11/17/2015  . Sinusitis 06/30/2015  . Hernia, abdominal 08/05/2014  . Vaginitis and vulvovaginitis 06/19/2014  . 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 Lung abscess ([place]) 06/26/2013  . 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   05/07/2013  . Ovarian cystic mass 05/07/2013  . s/p Serosal repair of colon 12/04/2012 12/15/2012  . Herpes genitalis in women 12/07/2012  . Bipolar disorder, unspecified 12/07/2012  . Anxiety state, unspecified 12/07/2012  . Unspecified asthma(493.90) 12/07/2012  . GERD (gastroesophageal reflux disease) 12/07/2012  . Pelvic mass in female s/p Robotic-assisted right salpingo-oophorectomy 12/04/2012  . SHINGLES 01/31/2009  . HEMORRHOIDS NOS, W/O COMPLICATIONS 40/98/1191  . DISORDER, DEPRESSIVE NEC 11/25/2006  . Allergic rhinitis 11/25/2006  . 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 Human immunodeficiency virus (HIV) disease ([place]) 11/10/2006    Allergies  Allergen Reactions  . Benzoin Other (See Comments)    blisters  . Latex Rash and Other (See Comments)  . Sulfamethoxazole-Trimethoprim Itching and Nausea And Vomiting    Prior to  Admission medications   Medication Sig Start Date End Date Taking? Authorizing Provider  butalbital-aspirin-caffeine [facility]) [phone] MG capsule Take 1 capsule by mouth every 6 (six) hours as needed for headache. 09/13/16   Dorena Dew, FNP  Darunavir Ethanolate (PREZISTA) 800 MG tablet TAKE 1 TABLET(800 MG) BY MOUTH DAILY 09/28/16   Campbell Riches, MD  elvitegravir-cobicistat-emtricitabine-tenofovir (GENVOYA)  150-150-200-10 MG TABS tablet Take 1 tablet by mouth daily with breakfast. 09/28/16   Campbell Riches, MD  fexofenadine [facility] ALLERGY) 180 MG tablet Take 1 tablet (180 mg total) by mouth daily. 04/28/16   Micheline Chapman, NP  lubiprostone (AMITIZA) 8 MCG capsule Take 8 mcg by mouth 2 (two) times daily with a meal.    Historical Provider, MD  montelukast (SINGULAIR) 10 MG tablet Take 1 tablet (10 mg total) by mouth at bedtime. 06/21/16   Michel Bickers, MD  oxymetazoline (AFRIN) 0.05 % nasal spray Place 1 spray into both nostrils 2 (two) times daily.    Historical Provider, MD  PROAIR HFA 108 (90 Base) MCG/ACT inhaler INHALE TWO PUFFS INTO THE LUNGS EVERY 6 HOURS AS NEEDED FOR WHEEZING. 08/30/16   Micheline Chapman, NP  QVAR 40 MCG/ACT inhaler INHALE TWO PUFFS INTO THE LUNGS TWO TIMES DAILY. 10/19/16   Micheline Chapman, NP  triamcinolone cream (KENALOG) 0.5 % Apply 1 application topically 3 (three) times daily. 09/13/16   Dorena Dew, FNP  valACYclovir (VALTREX) 500 MG tablet TAKE 1 TABLET (500 MG TOTAL) BY MOUTH DAILY. 03/29/16   Campbell Riches, MD    Past Medical, Surgical Family and Social History reviewed and updated.    Objective:   Today's Vitals   11/02/16 1345  BP: 122/68  Pulse: 80  Temp: 97.8 F (36.6 C)  TempSrc: Oral  SpO2: 100%  Weight: 195 lb (88.5 kg)  Height: 5\' 4"  (1.626 m)    Wt Readings from Last 3 Encounters:  11/02/16 195 lb (88.5 kg)  10/28/16 193 lb (87.5 kg)  09/27/16 189 lb (85.7 kg)   Physical Exam  Constitutional: She is oriented to person, place, and time. She appears well-developed and well-nourished.  HENT:  Head: Normocephalic and atraumatic.  Eyes: Conjunctivae and EOM are normal. Pupils are equal, round, and reactive to light.  Neck: Normal range of motion. Neck supple. No thyromegaly present.  Cardiovascular: Normal rate, normal heart sounds and intact distal pulses.   Pulmonary/Chest: Effort normal and breath sounds normal.   Lymphadenopathy:    She has no cervical adenopathy.  Neurological: She is alert and oriented to person, place, and time. She has normal strength. No cranial nerve deficit or sensory deficit. She displays a negative Romberg sign. Coordination and gait normal.  Skin: Skin is warm and dry.  Psychiatric: She has a normal mood and affect. Her behavior is normal. Thought content normal.      Assessment & Plan:  1. Other migraine without status migrainosus, non intractable 2. Fatigue, TSH was unremarkable x 1 month ago. Patient is not obtaining adequate sleep at night.  Plan: Naproxen 500 mg twice daily with food for chest wall strain and headaches. Start Topamax 25 mg once at bedtime for headache prevention.  Week 2 increase to 50 mg at bedtime. Return in 6 weeks for follow-up.  For acute severe migraines continue, Fiorinal.  For fatigue, take melatonin as needed in order to achieve a minimal of 6-8 hours of sleep per night to improve overall energy level.  Carroll Sage. Kenton Kingfisher, MSN, FNP-C  [facility] [phone]

## 2016-11-02 NOTE — Patient Instructions (Addendum)
Naproxen 500 mg twice daily with food for chest wall strain and headaches.  Start Topamax 25 mg once at bedtime for headache prevention.  Week 2 increase to 50 mg at bedtime.  Return in 6 weeks for follow-up.  For acute severe migraines continue, Fiorinal.

## 2016-11-25 ENCOUNTER — Encounter: Payer: Self-pay | Admitting: Family Medicine

## 2016-11-25 ENCOUNTER — Ambulatory Visit (INDEPENDENT_AMBULATORY_CARE_PROVIDER_SITE_OTHER): Payer: BC Managed Care – PPO | Admitting: Family Medicine

## 2016-11-25 VITALS — BP 124/78 | HR 78 | Temp 98.3°F | Resp 16 | Ht 64.0 in | Wt 197.0 lb

## 2016-11-25 DIAGNOSIS — G43809 Other migraine, not intractable, without status migrainosus: Secondary | ICD-10-CM

## 2016-11-25 DIAGNOSIS — R350 Frequency of micturition: Secondary | ICD-10-CM | POA: Diagnosis not present

## 2016-11-25 LAB — POCT URINALYSIS DIP (DEVICE)
Glucose, UA: NEGATIVE mg/dL
KETONES UR: NEGATIVE mg/dL
Nitrite: NEGATIVE
PH: 5.5 (ref 5.0–8.0)
Protein, ur: 30 mg/dL — AB
Specific Gravity, Urine: >=1.03 (ref 1.005–1.030)
Urobilinogen, UA: 2 mg/dL — ABNORMAL HIGH (ref 0.0–1.0)

## 2016-11-25 MED ORDER — AMOXICILLIN-POT CLAVULANATE 875-125 MG PO TABS: 1.0000 | ORAL_TABLET | Freq: Two times a day (BID) | ORAL | 0 refills | Status: DC

## 2016-11-25 MED ORDER — BUTALBITAL-ASPIRIN-CAFFEINE 50-325-40 MG PO CAPS: 1.0000 | ORAL_CAPSULE | Freq: Four times a day (QID) | ORAL | 0 refills | Status: DC | PRN

## 2016-11-25 NOTE — Progress Notes (Deleted)
Patient ID: RENNEE COYNE, female    DOB: May 09, 1981, 36 y.o.   MRN: [id number]  PCP: Molli Barrows, FNP  Chief Complaint  Patient presents with  . Sinusitis  . Back Pain    Subjective:  HPI  SENTA KANTOR is a 36 y.o. female presents for evaluation of sinus and back pain. Body chills, facial pain, headache, mild drainage, mostly postnasal drainage,stuffiness x 2 weeks. Back pain since she has been ill. Back pain is more of a dull achy pain   Social History   Social History  . Marital status: Single    Spouse name: N/A  . Number of children: 1  . Years of education: 34   Occupational History  . Not on file.   Social History Main Topics  . Smoking status: Never Smoker  . Smokeless tobacco: Never Used  . Alcohol use No     Comment: OCC.  . Drug use: No  . Sexual activity: Yes    Partners: Male    Birth control/ protection: Implant   Other Topics Concern  . Not on file   Social History Narrative   Patient lives alone.   Patient works at home.   Patient has hs education.   Patient has 1 child.   Patient drinks caffeine once in a while.    Family History  Problem Relation Age of Onset  . Arthritis/Rheumatoid Mother   . Hypertension Mother   . Cancer Maternal Aunt     brand and lung  . Cancer Maternal Grandmother     breasts  . Cancer Paternal Grandmother     breast and kidney     Review of Systems  Patient Active Problem List   Diagnosis Date Noted  . Acute upper respiratory infection 06/21/2016  . Hyperlipidemia 11/17/2015  . Sinusitis 06/30/2015  . Hernia, abdominal 08/05/2014  . Vaginitis and vulvovaginitis 06/19/2014  . 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 Lung abscess ([place]) 06/26/2013  . 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   05/07/2013  . Ovarian cystic mass 05/07/2013  . s/p Serosal repair of colon 12/04/2012 12/15/2012  . Herpes genitalis in women 12/07/2012  . Bipolar  disorder, unspecified 12/07/2012  . Anxiety state, unspecified 12/07/2012  . Unspecified asthma(493.90) 12/07/2012  . GERD (gastroesophageal reflux disease) 12/07/2012  . Pelvic mass in female s/p Robotic-assisted right salpingo-oophorectomy 12/04/2012  . SHINGLES 01/31/2009  . HEMORRHOIDS NOS, W/O COMPLICATIONS 92/06/9416  . DISORDER, DEPRESSIVE NEC 11/25/2006  . Allergic rhinitis 11/25/2006  . 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 Human immunodeficiency virus (HIV) disease ([place]) 11/10/2006    Allergies  Allergen Reactions  . Benzoin Other (See Comments)    blisters  . Latex Rash and Other (See Comments)  . Sulfamethoxazole-Trimethoprim Itching and Nausea And Vomiting    Prior to Admission medications   Medication Sig Start Date End Date Taking? Authorizing Provider  butalbital-aspirin-caffeine [facility]) [phone] MG capsule Take 1 capsule by mouth every 6 (six) hours as needed for headache. 09/13/16  Yes Dorena Dew, FNP  Darunavir Ethanolate (PREZISTA) 800 MG tablet TAKE 1 TABLET(800 MG) BY MOUTH DAILY 09/28/16  Yes Campbell Riches, MD  elvitegravir-cobicistat-emtricitabine-tenofovir (GENVOYA) 150-150-200-10 MG TABS tablet Take 1 tablet by mouth daily with breakfast. 09/28/16  Yes Campbell Riches, MD  fexofenadine [facility] ALLERGY) 180 MG tablet Take 1 tablet (180 mg total) by mouth daily. 04/28/16  Yes Micheline Chapman, NP  lubiprostone (AMITIZA) 8 MCG capsule Take 8 mcg  by mouth 2 (two) times daily with a meal.   Yes Historical Provider, MD  montelukast (SINGULAIR) 10 MG tablet Take 1 tablet (10 mg total) by mouth at bedtime. 06/21/16  Yes Michel Bickers, MD  oxymetazoline (AFRIN) 0.05 % nasal spray Place 1 spray into both nostrils 2 (two) times daily.   Yes Historical Provider, MD  PROAIR HFA 108 (90 Base) MCG/ACT inhaler INHALE TWO PUFFS INTO THE LUNGS EVERY 6 HOURS AS NEEDED FOR WHEEZING. 08/30/16  Yes Micheline Chapman, NP  QVAR 40 MCG/ACT inhaler INHALE TWO PUFFS INTO THE  LUNGS TWO TIMES DAILY. 10/19/16  Yes Micheline Chapman, NP  topiramate (TOPAMAX) 25 MG tablet Take 2 tablets (50 mg total) by mouth 2 (two) times daily. 11/02/16  Yes Sed[place] Muta, FNP  triamcinolone cream (KENALOG) 0.5 % Apply 1 application topically 3 (three) times daily. 09/13/16  Yes Dorena Dew, FNP  valACYclovir (VALTREX) 500 MG tablet TAKE 1 TABLET (500 MG TOTAL) BY MOUTH DAILY. 03/29/16  Yes Campbell Riches, MD    Past Medical, Surgical Family and Social History reviewed and updated.    Objective:   Today's Vitals   11/25/16 1359  BP: 124/78  Pulse: 78  Resp: 16  Temp: 98.3 F (36.8 C)  TempSrc: Oral  SpO2: 100%  Weight: 197 lb (89.4 kg)  Height: 5\' 4"  (1.626 m)    Wt Readings from Last 3 Encounters:  11/25/16 197 lb (89.4 kg)  11/02/16 195 lb (88.5 kg)  10/28/16 193 lb (87.5 kg)    Physical Exam         Assessment & Plan:  There are no diagnoses linked to this encounter.    Carroll Sage. Kenton Kingfisher, MSN, [facility] [address] [place], [place] [address] [phone]

## 2016-11-25 NOTE — Progress Notes (Signed)
Subjective:   [name] is a 36 y.o. female who presents for evaluation of possible sinusitis and generalized bodyaches.   Symptoms include achiness, congestion, chills, facial pain, headache described as frontal, night sweats, post nasal drip, shortness of breath, sinus pain and sneezing.  Reports a subjective fever. Onset of symptoms was 2 weeks ago, and has  been worsened over the last 3 days. Treatment to date: antihistamines and decongestants with temporary, minimal relief of symptoms.  Secondly, [name] reports urinary frequency. Denies burning, odors changes,  or pain with urination. Reports generalized body aches since experiencing URI symptoms times 2 weeks.   The following portions of the patient's history were reviewed and updated as appropriate:  allergies, current medications, past medical history.   Social History   Social History  . Marital status: Single    Spouse name: N/A  . Number of children: 1  . Years of education: 28   Occupational History  . Not on file.   Social History Main Topics  . Smoking status: Never Smoker  . Smokeless tobacco: Never Used  . Alcohol use No     Comment: OCC.  . Drug use: No  . Sexual activity: Yes    Partners: Male    Birth control/ protection: Implant   Other Topics Concern  . Not on file   Social History Narrative   Patient lives alone.   Patient works at home.   Patient has hs education.   Patient has 1 child.   Patient drinks caffeine once in a while.    Past Medical History:  Diagnosis Date  . Allergy   . Anemia   . Anxiety   . Anxiety state, unspecified 12/07/2012  . Arthritis   . Asthma   . Bipolar disorder, unspecified 12/07/2012  . Depression   . Diabetes mellitus without complication ([place])    no meds currently- controlled with diet  . GERD (gastroesophageal reflux disease)   . GERD (gastroesophageal reflux disease) 12/07/2012  . Headache(784.0)    migraines  . Herpes genitalis in women 12/07/2012   . HIV infection ([place])   . Human immunodeficiency virus (HIV) disease ([place]) 12/07/2012  . Neuromuscular disorder ([place])    carpal tunnel in both arms  . Type II or unspecified type diabetes mellitus without mention of complication, not stated as uncontrolled 12/07/2012  . Unspecified asthma(493.90) 12/07/2012     Pertinent items are noted in HPI. Objective:  BP 124/78 (BP Location: Right Arm, Patient Position: Sitting, Cuff Size: Small)   Pulse 78   Temp 98.3 F (36.8 C) (Oral)   Resp 16   Ht 5\' 4"  (1.626 m)   Wt 197 lb (89.4 kg)   SpO2 100%   BMI 33.81 kg/m   General Appearance:    Alert, cooperative, no distress, appears stated age  Head:    Normocephalic, without obvious abnormality, atraumatic  Eyes:    PERRL, conjunctiva/corneas clear  Ears:    Normal TM's and external ear canals, both ears  Nose:   Nares normal, septum midline, mucosa edema, sinus tenderness  Throat:   Lips, mucosa, and tongue normal;  erythema oropharynx present      Lungs:     Clear to auscultation bilaterally, respirations unlabored  Chest Wall:    No tenderness or deformity   Heart:    Regular rate and rhythm, S1 and S2 normal, no murmur, rub   or gallop  Extremities:   Extremities normal, atraumatic, no cyanosis or edema  Pulses:  2+ and symmetric all extremities  Skin:   Skin color, texture, turgor normal, no rashes or lesions  Neurologic:   Normal strength and Gait          Assessment:  Acute Sinusitis    Plan:  Discussed diagnosis and treatment of sinusitis. Supportive care with appropriate antipyretics and fluids. Follow up as needed. Call in if  symptoms aren't resolving within 5 days.  -Start Augmentin 875/125 mg twice daily x 10 days. -Continue cetrizine 10 mg at bedtime    Carroll Sage. Kenton Kingfisher, MSN, [facility] [address] [place], [place] [address] [phone]

## 2016-11-26 LAB — URINE CULTURE

## 2016-12-06 ENCOUNTER — Telehealth: Payer: Self-pay | Admitting: General Practice

## 2016-12-06 NOTE — Telephone Encounter (Signed)
Patient called and left message stating she had a cryo 5-6 weeks ago and is still bleeding bright red. Called patient and asked for more information about her bleeding. Patient states she bled a little after the cryo for a couple days then it stopped. Patient states that she started to bleed on Easter and has been bleeding since then. Patient reports bright red bleeding and passing of clots. Asked patient if she was on birth control and if this could just be a prolonged period. Patient states she is on Nexplanon which has been in place for 2 years and hasn't had periods. Discussed with patient that sometimes near the end, irregular bleeding can happen. Discussed that this is likely a prolonged period and not a problem from the cryo given timing. Patient voices concern over prolonged bleeding for 3 weeks and what is going on. Offered work in appt for tomorrow at [employer]. Patient verbalized understanding and states she will figure something out so she can come. Patient had no questions

## 2016-12-07 ENCOUNTER — Encounter: Payer: Self-pay | Admitting: Obstetrics & Gynecology

## 2016-12-07 ENCOUNTER — Ambulatory Visit (INDEPENDENT_AMBULATORY_CARE_PROVIDER_SITE_OTHER): Payer: BC Managed Care – PPO | Admitting: Obstetrics & Gynecology

## 2016-12-07 VITALS — BP 125/79 | HR 68 | Ht 64.0 in | Wt 195.4 lb

## 2016-12-07 DIAGNOSIS — N939 Abnormal uterine and vaginal bleeding, unspecified: Secondary | ICD-10-CM

## 2016-12-07 MED ORDER — NORETHINDRONE ACET-ETHINYL EST 1-20 MG-MCG PO TABS: 1.0000 | ORAL_TABLET | Freq: Every day | ORAL | 11 refills | Status: DC

## 2016-12-07 MED ORDER — NORETHINDRONE ACET-ETHINYL EST 1-20 MG-MCG PO TABS: 1.0000 | ORAL_TABLET | Freq: Every day | ORAL | 2 refills | Status: DC

## 2016-12-07 NOTE — Progress Notes (Signed)
History:  36 y.o. [id number] here today for eval of irreg bleeding. Pt reports that she has been bleeding since had a LEEP 10/28/2016. She reports that prior to that time she had bleeding that was very light about 3 days pre month. She reports that she does not have pain.  She is going on cruise and she wants to have the bleeding stopped prior to that.  She denies discharge or oder.  Pt is going on a trip this week and does not want to be bleeding during her trip.    The following portions of the patient's history were reviewed and updated as appropriate: allergies, current medications, past family history, past medical history, past social history, past surgical history and problem list.  Review of Systems:  Pertinent items are noted in HPI.   Objective:  Physical Exam Blood pressure 125/79, pulse 68, height 5\' 4"  (1.626 m), weight 195 lb 6.4 oz (88.6 kg). BP 125/79   Pulse 68   Ht 5\' 4"  (1.626 m)   Wt 195 lb 6.4 oz (88.6 kg)   BMI 33.54 kg/m  CONSTITUTIONAL: Well-developed, well-nourished female in no acute distress.  HENT:  Normocephalic, atraumatic EYES: Conjunctivae and EOM are normal. No scleral icterus.  NECK: Normal range of motion SKIN: Skin is warm and dry. No rash noted. Not diaphoretic.No pallor. [place]: Alert and oriented to person, place, and time. Normal coordination.  Abd: Soft, nontender and nondistended Pelvic: Normal appearing external genitalia; normal appearing vaginal mucosa and cervix.  Normal discharge.  There is blood noted in the vault. Small uterus, no other palpable masses, no uterine or adnexal tenderness. No CMT    Assessment & Plan:  AUB.  Suspect due to [place] 1/20 mcg. Pt will use for 1 month. Reviewed all of her options. She will f/u if the bleeding returns after her trip. She will complete at least 1 month.  Total face-to-face time with patient was 15 min.  Greater than 50% was spent in counseling and coordination of care with the  patient.   Mellody Masri L. Harraway-Smith, M.D., Cherlynn June

## 2016-12-13 ENCOUNTER — Telehealth: Payer: Self-pay | Admitting: *Deleted

## 2016-12-13 NOTE — Telephone Encounter (Signed)
Called the patient to let her know that her medication is ready to pick up. Had to leave a message.

## 2016-12-21 ENCOUNTER — Ambulatory Visit: Payer: Self-pay | Admitting: Family Medicine

## 2016-12-23 ENCOUNTER — Other Ambulatory Visit: Payer: Self-pay | Admitting: *Deleted

## 2016-12-23 MED ORDER — BECLOMETHASONE DIPROPIONATE 40 MCG/ACT IN AERS: INHALATION_SPRAY | RESPIRATORY_TRACT | 2 refills | Status: DC

## 2016-12-24 ENCOUNTER — Telehealth: Payer: Self-pay | Admitting: *Deleted

## 2016-12-24 NOTE — Telephone Encounter (Signed)
Pt left message yesterday stating that she was seen 2 weeks in office.  The OCP's prescribed are not working and she wants to know if she can get a pill which is stronger.

## 2016-12-28 ENCOUNTER — Other Ambulatory Visit: Payer: Self-pay | Admitting: Obstetrics & Gynecology

## 2016-12-28 ENCOUNTER — Telehealth: Payer: Self-pay | Admitting: Obstetrics & Gynecology

## 2016-12-28 DIAGNOSIS — N921 Excessive and frequent menstruation with irregular cycle: Secondary | ICD-10-CM

## 2016-12-28 MED ORDER — NORETHINDRONE ACET-ETHINYL EST 1.5-30 MG-MCG PO TABS: 1.0000 | ORAL_TABLET | Freq: Every day | ORAL | 3 refills | Status: DC

## 2016-12-28 NOTE — Telephone Encounter (Signed)
Returned patient's call. She stated that she is still having bleeding on the current OCP prescription. She had spoken to her pharmacist who told her that she probably needs a stronger prescription. I told patient that I would message Dr Ihor Dow and see if she will send in a new prescription.Understanding voiced.

## 2016-12-28 NOTE — Telephone Encounter (Signed)
Returned TC to pt. Her bleeding is less but, still intermittently heavy on the LoEstrin 1/20. She is on Darunavir which can decrease the effectiveness of hte OCPS so I rec increasing the EES component in the OCPs to LoEstrin 1.5/30. Pt agrees with POC.  Caulder Wehner L. Harraway-Smith, M.D., [facility]

## 2017-01-08 ENCOUNTER — Other Ambulatory Visit: Payer: Self-pay | Admitting: Family Medicine

## 2017-01-08 DIAGNOSIS — L309 Dermatitis, unspecified: Secondary | ICD-10-CM

## 2017-01-11 ENCOUNTER — Other Ambulatory Visit: Payer: BC Managed Care – PPO

## 2017-01-11 DIAGNOSIS — Z79899 Other long term (current) drug therapy: Secondary | ICD-10-CM | POA: Diagnosis not present

## 2017-01-11 DIAGNOSIS — Z113 Encounter for screening for infections with a predominantly sexual mode of transmission: Secondary | ICD-10-CM

## 2017-01-11 DIAGNOSIS — B20 Human immunodeficiency virus [HIV] disease: Secondary | ICD-10-CM | POA: Diagnosis not present

## 2017-01-11 LAB — COMPREHENSIVE METABOLIC PANEL
ALBUMIN: 3.7 g/dL (ref 3.6–5.1)
ALT: 27 U/L (ref 6–29)
AST: 16 U/L (ref 10–30)
Alkaline Phosphatase: 65 U/L (ref 33–115)
BUN: 16 mg/dL (ref 7–25)
CALCIUM: 8.9 mg/dL (ref 8.6–10.2)
CHLORIDE: 105 mmol/L (ref 98–110)
CO2: 23 mmol/L (ref 20–31)
CREATININE: 1.26 mg/dL — AB (ref 0.50–1.10)
Glucose, Bld: 82 mg/dL (ref 65–99)
POTASSIUM: 4.5 mmol/L (ref 3.5–5.3)
SODIUM: 136 mmol/L (ref 135–146)
TOTAL PROTEIN: 6.4 g/dL (ref 6.1–8.1)
Total Bilirubin: 0.3 mg/dL (ref 0.2–1.2)

## 2017-01-11 LAB — LIPID PANEL
CHOLESTEROL: 225 mg/dL — AB (ref <200)
HDL: 35 mg/dL — AB (ref >50)
LDL Cholesterol: 160 mg/dL — ABNORMAL HIGH (ref <100)
TRIGLYCERIDES: 151 mg/dL — AB (ref <150)
Total CHOL/HDL Ratio: 6.4 Ratio — ABNORMAL HIGH (ref <5.0)
VLDL: 30 mg/dL (ref <30)

## 2017-01-11 LAB — CBC
HEMATOCRIT: 39.8 % (ref 35.0–45.0)
Hemoglobin: 13.4 g/dL (ref 11.7–15.5)
MCH: 32.3 pg (ref 27.0–33.0)
MCHC: 33.7 g/dL (ref 32.0–36.0)
MCV: 95.9 fL (ref 80.0–100.0)
MPV: 9.5 fL (ref 7.5–12.5)
Platelets: 299 10*3/uL (ref 140–400)
RBC: 4.15 MIL/uL (ref 3.80–5.10)
RDW: 12.9 % (ref 11.0–15.0)
WBC: 9.1 10*3/uL (ref 3.8–10.8)

## 2017-01-12 LAB — T-HELPER CELL (CD4) - (RCID CLINIC ONLY)
CD4 T CELL HELPER: 34 % (ref 33–55)
CD4 T Cell Abs: 1510 /uL (ref 400–2700)

## 2017-01-12 LAB — RPR

## 2017-01-13 LAB — HIV-1 RNA QUANT-NO REFLEX-BLD
HIV 1 RNA Quant: 27 copies/mL — ABNORMAL HIGH
HIV-1 RNA Quant, Log: 1.43 Log copies/mL — ABNORMAL HIGH

## 2017-01-16 ENCOUNTER — Other Ambulatory Visit: Payer: Self-pay | Admitting: Family Medicine

## 2017-01-16 DIAGNOSIS — G43809 Other migraine, not intractable, without status migrainosus: Secondary | ICD-10-CM

## 2017-01-17 ENCOUNTER — Telehealth: Payer: Self-pay

## 2017-01-17 NOTE — Telephone Encounter (Signed)
Please have patient schedule a follow-up appointment for headache evaluation. Never returned  for 6 week follow-up

## 2017-01-17 NOTE — Telephone Encounter (Signed)
Refill request for Fiorinal. Please advise. Last office visit with [name] on 11/25/2016. Thanks!

## 2017-01-20 ENCOUNTER — Ambulatory Visit (INDEPENDENT_AMBULATORY_CARE_PROVIDER_SITE_OTHER): Payer: BC Managed Care – PPO | Admitting: Family Medicine

## 2017-01-20 ENCOUNTER — Encounter: Payer: Self-pay | Admitting: Family Medicine

## 2017-01-20 DIAGNOSIS — G44229 Chronic tension-type headache, not intractable: Secondary | ICD-10-CM

## 2017-01-20 MED ORDER — TOPIRAMATE ER 100 MG PO SPRINKLE CAP24: 100.0000 mg | EXTENDED_RELEASE_CAPSULE | Freq: Every day | ORAL | 1 refills | Status: DC

## 2017-01-20 MED ORDER — RIZATRIPTAN BENZOATE 5 MG PO TBDP: 10.0000 mg | ORAL_TABLET | ORAL | 0 refills | Status: DC | PRN

## 2017-01-20 MED ORDER — TIZANIDINE HCL 4 MG PO CAPS: 4.0000 mg | ORAL_CAPSULE | Freq: Three times a day (TID) | ORAL | 2 refills | Status: DC | PRN

## 2017-01-20 NOTE — Progress Notes (Signed)
Patient ID: [name], female    DOB: [birth year], 36 y.o.   MRN: [id number]  PCP: Scot Jun, FNP  Chief Complaint  Patient presents with  . Headache  . BUMPS ON SIDE OF FACE    Subjective:  HPI [name] is a 36 y.o. female presents for headache follow-up. Shifra was last seen in the clinic 11/02/16 for worsening headaches. During that visit she was placed on Topamax for headache prevention. She is a longtime migraine headache sufferer that  mainly managed headaches with high dose Naproxen and Ibuprofen.  Prior to Topamax she had attempted  abortive therapy with sumatriptan which was ineffective in relieving symptoms. Presently she complains that the Fiorinal and Topamax is causing drowsiness. She has been taking Topamax during the day oppose to at bedtime as prescribed.  She reports that the Topamax has significantly reduced the frequency of headaches, although she continues to have extended clusters of headaches lasting 2-3 days. She denies any associated weakness, visual alterations, dizziness, or syncopal episodes. Reports associated nausea, light and smell sensitivity.     Medication and problem list reviewed.  Social History   Social History  . Marital status: Single    Spouse name: N/A  . Number of children: 1  . Years of education: 28   Occupational History  . Not on file.   Social History Main Topics  . Smoking status: Never Smoker  . Smokeless tobacco: Never Used  . Alcohol use No     Comment: OCC.  . Drug use: No  . Sexual activity: Yes    Partners: Male    Birth control/ protection: Implant   Other Topics Concern  . Not on file   Social History Narrative   Patient lives alone.   Patient works at home.   Patient has hs education.   Patient has 1 child.   Patient drinks caffeine once in a while.    Family History  Problem Relation Age of Onset  . Arthritis/Rheumatoid Mother   . Hypertension Mother   . Cancer Maternal Aunt         brand and lung  . Cancer Maternal Grandmother        breasts  . Cancer Paternal Grandmother        breast and kidney    Review of Systems See HPI  Patient Active Problem List   Diagnosis Date Noted  . Acute upper respiratory infection 06/21/2016  . Hyperlipidemia 11/17/2015  . Sinusitis 06/30/2015  . Hernia, abdominal 08/05/2014  . Vaginitis and vulvovaginitis 06/19/2014  . 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 Lung abscess ([place]) 06/26/2013  . 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   05/07/2013  . Ovarian cystic mass 05/07/2013  . s/p Serosal repair of colon 12/04/2012 12/15/2012  . Herpes genitalis in women 12/07/2012  . Bipolar disorder, unspecified ([place]) 12/07/2012  . Anxiety state, unspecified 12/07/2012  . Unspecified asthma(493.90) 12/07/2012  . GERD (gastroesophageal reflux disease) 12/07/2012  . Pelvic mass in female s/p Robotic-assisted right salpingo-oophorectomy 12/04/2012  . SHINGLES 01/31/2009  . HEMORRHOIDS NOS, W/O COMPLICATIONS 83/41/9622  . DISORDER, DEPRESSIVE NEC 11/25/2006  . Allergic rhinitis 11/25/2006  . 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 Human immunodeficiency virus (HIV) disease ([place]) 11/10/2006    Allergies  Allergen Reactions  . Benzoin Other (See Comments)    blisters  . Latex Rash and Other (See Comments)  . Sulfamethoxazole-Trimethoprim Itching and Nausea And Vomiting    Prior  to Admission medications   Medication Sig Start Date End Date Taking? Authorizing Provider  beclomethasone (QVAR) 40 MCG/ACT inhaler INHALE TWO PUFFS INTO THE LUNGS TWO TIMES DAILY. 12/23/16  Yes Michel Bickers, MD  butalbital-aspirin-caffeine [facility]) [phone] MG capsule Take 1 capsule by mouth every 6 (six) hours as needed for headache. 11/25/16  Yes Scot Jun, FNP  Darunavir Ethanolate (PREZISTA) 800 MG tablet TAKE 1 TABLET(800 MG) BY MOUTH DAILY 09/28/16  Yes Campbell Riches, MD   elvitegravir-cobicistat-emtricitabine-tenofovir (GENVOYA) 150-150-200-10 MG TABS tablet Take 1 tablet by mouth daily with breakfast. 09/28/16  Yes Campbell Riches, MD  fexofenadine [facility]) 180 MG tablet Take 1 tablet (180 mg total) by mouth daily. 04/28/16  Yes Micheline Chapman, NP  lubiprostone (AMITIZA) 8 MCG capsule Take 8 mcg by mouth 2 (two) times daily with a meal.   Yes [provider]  montelukast (SINGULAIR) 10 MG tablet Take 1 tablet (10 mg total) by mouth at bedtime. 06/21/16  Yes Michel Bickers, MD  Norethindrone Acetate-Ethinyl Estradiol (LOESTRIN 1.5/30, 21,) 1.5-30 MG-MCG tablet Take 1 tablet by mouth daily. 12/28/16  Yes Lavonia Drafts, MD  oxymetazoline (AFRIN) 0.05 % nasal spray Place 1 spray into both nostrils 2 (two) times daily.   Yes [provider]  PROAIR HFA 108 (90 Base) MCG/ACT inhaler INHALE TWO PUFFS INTO THE LUNGS EVERY 6 HOURS AS NEEDED FOR WHEEZING. 08/30/16  Yes Micheline Chapman, NP  topiramate (TOPAMAX) 25 MG tablet Take 2 tablets (50 mg total) by mouth 2 (two) times daily. 11/02/16  Yes Scot Jun, FNP  triamcinolone cream (KENALOG) 0.5 % APPLY 1 APPLICATION TOPICALLY 3 (THREE) TIMES DAILY. 01/11/17  Yes Scot Jun, FNP  valACYclovir (VALTREX) 500 MG tablet TAKE 1 TABLET (500 MG TOTAL) BY MOUTH DAILY. 03/29/16  Yes Campbell Riches, MD    Past Medical, Surgical Family and Social History reviewed and updated.    Objective:   Today's Vitals   01/20/17 1421  BP: (!) 142/88  Pulse: 88  Resp: 16  Temp: 98.2 F (36.8 C)  TempSrc: Oral  SpO2: 100%  Weight: 193 lb 9.6 oz (87.8 kg)  Height: 5\' 4"  (1.626 m)    Wt Readings from Last 3 Encounters:  01/20/17 193 lb 9.6 oz (87.8 kg)  12/07/16 195 lb 6.4 oz (88.6 kg)  11/25/16 197 lb (89.4 kg)    Physical Exam  Constitutional: She is oriented to person, place, and time. She appears well-developed and well-nourished.  HENT:  Head: Normocephalic and  atraumatic.  Eyes: Conjunctivae and EOM are normal. Pupils are equal, round, and reactive to light.  Cardiovascular: Normal rate, regular rhythm, normal heart sounds and intact distal pulses.   Pulmonary/Chest: Effort normal and breath sounds normal.  Musculoskeletal: Normal range of motion.  Neurological: She is alert and oriented to person, place, and time. She displays normal reflexes. No cranial nerve deficit. She exhibits normal muscle tone. Coordination normal.  Skin: Skin is warm and dry.  Psychiatric: She has a normal mood and affect. Her behavior is normal. Thought content normal.    Assessment & Plan:  1. Chronic tension-type headache, not intractable Changing medications as follows: -Increasing Topamax to 100 mg at bedtime  -Adding Tizanidine 4 mg take 1 tablet as needed up to 3 times during the initial onset of headache. -Adding Maxalt 4 mg, make take up to 3 times daily for acute severe headache-should be taken at onset.  Refilled triamcinolone cream for chronic eczema  RTC:  6-8 weeks for headache evaluation  Carroll Sage. Kenton Kingfisher, MSN, FNP-C [facility]  [address] [place], [place] [address] [phone]

## 2017-01-20 NOTE — Patient Instructions (Addendum)
New headache regimen   Start Topomax 100 mg once daily at bedtime.  Take Tizanidine 4 mg up to 3 times daily.  Take Rizatriptan 10 mg at the on-set of a headache.   Follow-up with me in August for headaches evaluation.    Migraine Headache A migraine headache is a very strong throbbing pain on one side or both sides of your head. Migraines can also cause other symptoms. Talk with your doctor about what things may bring on (trigger) your migraine headaches. Follow these instructions at home: Medicines  Take over-the-counter and prescription medicines only as told by your doctor.  Do not drive or use heavy machinery while taking prescription pain medicine.  To prevent or treat constipation while you are taking prescription pain medicine, your doctor may recommend that you: ? Drink enough fluid to keep your pee (urine) clear or pale yellow. ? Take over-the-counter or prescription medicines. ? Eat foods that are high in fiber. These include fresh fruits and vegetables, whole grains, and beans. ? Limit foods that are high in fat and processed sugars. These include fried and sweet foods. Lifestyle  Avoid alcohol.  Do not use any products that contain nicotine or tobacco, such as cigarettes and e-cigarettes. If you need help quitting, ask your doctor.  Get at least 8 hours of sleep every night.  Limit your stress. General instructions   Keep a journal to find out what may bring on your migraines. For example, write down: ? What you eat and drink. ? How much sleep you get. ? Any change in what you eat or drink. ? Any change in your medicines.  If you have a migraine: ? Avoid things that make your symptoms worse, such as bright lights. ? It may help to lie down in a dark, quiet room. ? Do not drive or use heavy machinery. ? Ask your doctor what activities are safe for you.  Keep all follow-up visits as told by your doctor. This is important. Contact a doctor if:  You get  a migraine that is different or worse than your usual migraines. Get help right away if:  Your migraine gets very bad.  You have a fever.  You have a stiff neck.  You have trouble seeing.  Your muscles feel weak or like you cannot control them.  You start to lose your balance a lot.  You start to have trouble walking.  You pass out (faint). This information is not intended to replace advice given to you by your health care provider. Make sure you discuss any questions you have with your health care provider. Document Released: 05/11/2008 Document Revised: 02/20/2016 Document Reviewed: 01/19/2016 Elsevier Interactive Patient Education  2017 [employer].

## 2017-01-23 DIAGNOSIS — G44229 Chronic tension-type headache, not intractable: Secondary | ICD-10-CM | POA: Insufficient documentation

## 2017-01-25 ENCOUNTER — Ambulatory Visit: Payer: Self-pay | Admitting: Infectious Diseases

## 2017-02-07 ENCOUNTER — Ambulatory Visit (INDEPENDENT_AMBULATORY_CARE_PROVIDER_SITE_OTHER): Payer: BC Managed Care – PPO | Admitting: Licensed Clinical Social Worker

## 2017-02-07 ENCOUNTER — Ambulatory Visit (INDEPENDENT_AMBULATORY_CARE_PROVIDER_SITE_OTHER): Payer: BC Managed Care – PPO | Admitting: Infectious Diseases

## 2017-02-07 ENCOUNTER — Encounter: Payer: Self-pay | Admitting: Infectious Diseases

## 2017-02-07 VITALS — BP 112/80 | HR 79 | Temp 98.0°F | Ht 64.0 in | Wt 188.0 lb

## 2017-02-07 DIAGNOSIS — E785 Hyperlipidemia, unspecified: Secondary | ICD-10-CM

## 2017-02-07 DIAGNOSIS — F329 Major depressive disorder, single episode, unspecified: Secondary | ICD-10-CM

## 2017-02-07 DIAGNOSIS — B20 Human immunodeficiency virus [HIV] disease: Secondary | ICD-10-CM

## 2017-02-07 DIAGNOSIS — Z79899 Other long term (current) drug therapy: Secondary | ICD-10-CM

## 2017-02-07 DIAGNOSIS — Z113 Encounter for screening for infections with a predominantly sexual mode of transmission: Secondary | ICD-10-CM | POA: Diagnosis not present

## 2017-02-07 DIAGNOSIS — N83202 Unspecified ovarian cyst, left side: Secondary | ICD-10-CM | POA: Diagnosis not present

## 2017-02-07 DIAGNOSIS — F320 Major depressive disorder, single episode, mild: Secondary | ICD-10-CM

## 2017-02-07 DIAGNOSIS — F32A Depression, unspecified: Secondary | ICD-10-CM

## 2017-02-07 DIAGNOSIS — R87612 Low grade squamous intraepithelial lesion on cytologic smear of cervix (LGSIL): Secondary | ICD-10-CM

## 2017-02-07 NOTE — Progress Notes (Signed)
   Subjective:    Patient ID: [name], female    DOB: [birth year], 36 y.o.   MRN: [id number]  HPI 36 yo F with hx of HIV+ on prezista-genvoya. 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   She has a hx of CIN1-HPV. She underwent cryo on 10-28-16.   Her course has been complicated by irregular vaginal bleeding. She has been started on OCPs by OBGyn. States her implant has caused the vaginal bleeding.   Lab Results  Component Value Date   CHOL 225 (H) 01/11/2017   HDL 35 (L) 01/11/2017   LDLCALC 160 (H) 01/11/2017   TRIG 151 (H) 01/11/2017   CHOLHDL 6.4 (H) 01/11/2017    HIV 1 RNA Quant (copies/mL)  Date Value  01/11/2017 27 (H)  05/18/2016 <20  11/05/2015 37 (H)   CD4 T Cell Abs (/uL)  Date Value  01/11/2017 1,510  05/18/2016 1,300  11/05/2015 1,520   Has been watching her diet, concerned about her high Chol. Has been exercising- bike, treadmill.  The ASCVD Risk score Mikey Bussing DC Jr., et al., 2013) failed to calculate for the following reasons:   The 2013 ASCVD risk score is only valid for ages 1 to 2   On topamax, tiazanidine for migraines.   Has been off amitiza.   Takes melatonin prn for insomnia.  Can't get to sleep. Wakes up early (6-7) Feels like her depression is getting worse.   Review of Systems  Constitutional: Positive for fatigue. Negative for appetite change, chills, fever and unexpected weight change.  HENT: Positive for postnasal drip and sinus pain. Negative for sinus pressure.   Respiratory: Negative for cough and shortness of breath.   Gastrointestinal: Positive for constipation. Negative for diarrhea.  Genitourinary: Positive for menstrual problem. Negative for difficulty urinating.  Neurological: Positive for headaches.  polyuria.      Objective:   Physical Exam  Constitutional: She appears well-developed and well-nourished.  HENT:  Mouth/Throat: No oropharyngeal exudate.  Eyes: EOM are  normal. Pupils are equal, round, and reactive to light.  Neck: Neck supple.  Cardiovascular: Normal rate, regular rhythm and normal heart sounds.   Pulmonary/Chest: Effort normal and breath sounds normal.  Abdominal: Soft. Bowel sounds are normal. There is no tenderness. There is no rebound.  Musculoskeletal: She exhibits no edema.  Lymphadenopathy:    She has no cervical adenopathy.  Psychiatric: She has a normal mood and affect.      Assessment & Plan:

## 2017-02-07 NOTE — Assessment & Plan Note (Signed)
Bleeding resolved.  S/p cryo

## 2017-02-07 NOTE — Assessment & Plan Note (Signed)
Driving her sleep and her fatigue.  She is going to see [employer].

## 2017-02-07 NOTE — Assessment & Plan Note (Signed)
Ran her tests throught he ASCVD calculator and she is too young for statin.

## 2017-02-07 NOTE — Assessment & Plan Note (Addendum)
She is doing well her current meds Offered/refused condoms.  Mening vax today rtc in 6 months.

## 2017-02-07 NOTE — Assessment & Plan Note (Signed)
Felt to be benign To have u/s in December at GYN f/u

## 2017-02-08 NOTE — Progress Notes (Signed)
Integrated Behavioral Health Initial Visit  MRN: [id number] Name: [name]   Session Start time: 4:30 pm Session End time: 4:50 pm Total time: 20 minutes  Type of Service: [place] Interpretor:No. Interpretor Name and Language: N/A   Warm Hand Off Completed.       SUBJECTIVE: [name] is a 36 y.o. female accompanied by patient. Patient was referred by Dr. Johnnye Sima for depressive and anxiety symptoms. Patient reports that she has history of depression and anxiety and of receiving mental health treatment, which she has not received since 2015.  Patient reported that her symptoms have been increasing.  Patient was receptive of scheduling appointment with [facility] for assessment and additional coping strategies.   Severity of problem: mild  OBJECTIVE: Mood: Anxious and Depressed and Affect: Tearful Risk of harm to self or others: No plan to harm self or others   GOALS ADDRESSED: Patient will reduce symptoms of: anxiety, depression and mood instability and increase knowledge and/or ability of: coping skills and self-management skills and also: Increase healthy adjustment to current life circumstances and Increase adequate support systems for patient/family   INTERVENTIONS: Mindfulness or Relaxation Training  Visualization exercise  ASSESSMENT: Patient currently experiencing anxiety and depression symptoms. Patient may benefit from community mental health services and learning additional coping strategies.  PLAN: 1. Follow up with behavioral health clinician on : Thursday June 28 at 1:30 pm 2. Behavioral recommendations: Continue to use visualization exercise at home  3. Report to nearest emergency room or call 911 if suicidal ideations with plan develop.   Sande Rives, [facility]

## 2017-02-10 ENCOUNTER — Ambulatory Visit (INDEPENDENT_AMBULATORY_CARE_PROVIDER_SITE_OTHER): Payer: BC Managed Care – PPO | Admitting: Licensed Clinical Social Worker

## 2017-02-10 DIAGNOSIS — F322 Major depressive disorder, single episode, severe without psychotic features: Secondary | ICD-10-CM
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
## 2017-02-24 ENCOUNTER — Ambulatory Visit: Payer: Self-pay | Admitting: Licensed Clinical Social Worker

## 2017-02-28 ENCOUNTER — Telehealth: Payer: Self-pay | Admitting: Licensed Clinical Social Worker

## 2017-02-28 ENCOUNTER — Ambulatory Visit (INDEPENDENT_AMBULATORY_CARE_PROVIDER_SITE_OTHER): Payer: BC Managed Care – PPO | Admitting: Licensed Clinical Social Worker

## 2017-02-28 ENCOUNTER — Ambulatory Visit: Payer: Self-pay | Admitting: Licensed Clinical Social Worker

## 2017-02-28 DIAGNOSIS — F322 Major depressive disorder, single episode, severe without psychotic features: Secondary | ICD-10-CM

## 2017-02-28 MED ORDER — LURASIDONE HCL 20 MG PO TABS: 20.0000 mg | ORAL_TABLET | Freq: Every day | ORAL | 0 refills | Status: DC

## 2017-02-28 NOTE — Telephone Encounter (Signed)
Spoke with patient who wanted to know what time her medication would be ready to pick up.  [facility] stated that she would make contact with the nurse and call her back.  Sande Rives, [facility]

## 2017-02-28 NOTE — Telephone Encounter (Signed)
Spoke with patient and informed her that nursing called the medication in and the pharmacy stated it would be ready in about one hour.  Sande Rives, [employer] [facility] [employer] [facility]

## 2017-02-28 NOTE — Progress Notes (Signed)
Integrated Behavioral Health Follow Up Visit  MRN: [id number] Name: [name]   Session Start time: 10:25 am Session End time: 12:05 pm Total time: 1 hour, 40 mins Number of Integrated Behavioral Health Clinician visits: 2/10  Type of Service: [place] Interpretor:No. Interpretor Name and Language: N/A   Warm Hand Off Completed.       SUBJECTIVE: [name] is a 36 y.o. female accompanied by patient. Patient was referred by Dr. Johnnye Sima for depressive symptoms.  Patient reports the following symptoms/concerns: See below for GAD-7 score (12-Moderate) from today and see Screening section for Major Depression Inventory score of 34/50.  Patient denied current suicidal ideations or plan experienced today, or over the weekend.  Patient reported that she last experienced suicidal ideations about one year ago and verbalized that she would tell her daughter's father if ideations develop.  Based on patient's high scores, patient was engaged in discussion about the need to schedule an appointment for medication management or possibly admit self to inpatient facility to improve functioning and mood.  Patient was engaged in a discussion about her past medications to manage her mental health and the medications which were most helpful for her.  Patient actively reviewed the role of medications in improving mood and related her lack of current medications to her high assessment scores.  During the session, the [facility] spoke with Dr. Johnnye Sima about his ability to order medication and he felt prescribing Lutuda would allow for symptom improvement.  Dr. Johnnye Sima reported that he would call in medication to patient's preferred pharmacy.  [facility] also called [facility] to inquire about their admission criteria and insurance pre-approval and was educated that patient did not meet criteria for admission and was referred to their outpatient behavioral health  center on [address].  Two separate appointments were made for counseling and psychiatry (see below for dates).  [facility] also discussed patient's financial concerns about affording the $85 copay amount for visits and was informed that patient can be billed for a portion of the amounts.  The patient was educated about this and was receptive.  Patient denied any safety concerns with reporting home today.  Patient reported her mood as "frustrated" and reported that she has been staying at her daughter's boyfriend's house recently because he is concerned with the patient's level of depression.  Patient reviewed assignment of thinking on ways to verbalize internalized thoughts to her mother and reported that she did not complete the assignment because she thinks her mother will not apologize or be receptive.  Patient was only partially receptive to reviewing the personal reasons for verbalizing her thoughts to her mother and releasing resentment.  Patient was receptive to the connection between stuffing emotions, not verbalizing feelings, and low self-worth.  Severity of problem: severe  OBJECTIVE: Mood: Depressed and Affect: Constricted and Tearful and with low energy Risk of harm to self or others: No plan to harm self or others  Thought process: coherent Thought content: logical Speech volume: low   GOALS ADDRESSED: Patient will reduce symptoms of: depression and increase knowledge and/or ability of: coping skills and healthy habits and also: Increase healthy adjustment to current life circumstances, Increase adequate support systems for patient/family and Improve medication compliance  INTERVENTIONS: Solution-Focused Strategies and Link to [employer] and provided patient with suicide hotline numbers and mobile crisis numbers Standardized Assessments completed: GAD-7   GAD 7 : Generalized Anxiety Score 02/28/2017 10/28/2016 09/24/2016  Nervous, Anxious, on Edge 2 1  0  Control/stop worrying 2 0 0   Worry too much - different things 3 0 0  Trouble relaxing 1 0 0  Restless 0 0 0  Easily annoyed or irritable 2 0 0  Afraid - awful might happen 2 0 0  Total GAD 7 Score 12 1 0  Anxiety Difficulty Somewhat difficult - -    Major Depression Inventory also completed, see Screening Section, for score of 34/50.  ASSESSMENT: Patient currently experiencing severe depression symptoms and may benefit from increased counseling sessions, a psy eval, and medication management.  PLAN: 1. Follow up with behavioral health clinician on : Monday July 23rd at 8: 45 am 2. Referral(s): [employer] logistics/support/administrative officer (LME/Outside Clinic) and Psychiatrist  3. Counselor appointment scheduled for Aug 30th at 8 am and psychiatry appointment scheduled for September 27th at 1 pm 4. If patient goes into crisis or develops suicidal ideations, plan, or intention she will call 911, call suicide hotline numbers provided, report to the nearest emergency room, or call the mobile crisis numbers provided.  Sande Rives, [facility]

## 2017-03-07 ENCOUNTER — Ambulatory Visit (INDEPENDENT_AMBULATORY_CARE_PROVIDER_SITE_OTHER): Payer: BC Managed Care – PPO | Admitting: Licensed Clinical Social Worker

## 2017-03-07 ENCOUNTER — Ambulatory Visit (INDEPENDENT_AMBULATORY_CARE_PROVIDER_SITE_OTHER): Payer: BC Managed Care – PPO | Admitting: Pharmacist

## 2017-03-07 DIAGNOSIS — B20 Human immunodeficiency virus [HIV] disease: Secondary | ICD-10-CM

## 2017-03-07 DIAGNOSIS — F322 Major depressive disorder, single episode, severe without psychotic features: Secondary | ICD-10-CM

## 2017-03-07 MED ORDER — BICTEGRAVIR-EMTRICITAB-TENOFOV 50-200-25 MG PO TABS: 1.0000 | ORAL_TABLET | Freq: Every day | ORAL | 6 refills | Status: DC

## 2017-03-07 NOTE — Progress Notes (Signed)
HPI: [name] is a 36 y.o. female who presents to the [place] clinic to follow-up with our counselor, [name].  I was asked to see the patient because she was told there was a drug interaction between her HIV medication and her bipolar medication.   Allergies: Allergies  Allergen Reactions  . Benzoin Other (See Comments)    blisters  . Latex Rash and Other (See Comments)  . Sulfamethoxazole-Trimethoprim Itching and Nausea And Vomiting    Past Medical History: Past Medical History:  Diagnosis Date  . Allergy   . Anemia   . Anxiety   . Anxiety state, unspecified 12/07/2012  . Arthritis   . Asthma   . Bipolar disorder, unspecified ([place]) 12/07/2012  . Depression   . Diabetes mellitus without complication ([place])    no meds currently- controlled with diet  . GERD (gastroesophageal reflux disease)   . GERD (gastroesophageal reflux disease) 12/07/2012  . Headache(784.0)    migraines  . Herpes genitalis in women 12/07/2012  . HIV infection ([place])   . Human immunodeficiency virus (HIV) disease ([place]) 12/07/2012  . Neuromuscular disorder ([place])    carpal tunnel in both arms  . Type II or unspecified type diabetes mellitus without mention of complication, not stated as uncontrolled 12/07/2012  . Unspecified asthma(493.90) 12/07/2012    Social History: Social History   Social History  . Marital status: Single    Spouse name: N/A  . Number of children: 1  . Years of education: 46   Social History Main Topics  . Smoking status: Never Smoker  . Smokeless tobacco: Never Used  . Alcohol use No     Comment: OCC.  . Drug use: No  . Sexual activity: Yes    Partners: Male    Birth control/ protection: Implant   Other Topics Concern  . Not on file   Social History Narrative   Patient lives alone.   Patient works at home.   Patient has hs education.   Patient has 1 child.   Patient drinks caffeine once in a while.    Current Regimen: Genvoya + Prezista  Labs: HIV 1 RNA Quant  (copies/mL)  Date Value  01/11/2017 27 (H)  05/18/2016 <20  11/05/2015 37 (H)   CD4 T Cell Abs (/uL)  Date Value  01/11/2017 1,510  05/18/2016 1,300  11/05/2015 1,520   Hep B S Ab (no units)  Date Value  10/17/2013 NEG   Hepatitis B Surface Ag (no units)  Date Value  11/10/2006 NEG   HCV Ab (no units)  Date Value  11/10/2006 NEG    CrCl: CrCl cannot be calculated (Patient's most recent lab result is older than the maximum 21 days allowed.).  Lipids:    Component Value Date/Time   CHOL 225 (H) 01/11/2017 1424   TRIG 151 (H) 01/11/2017 1424   HDL 35 (L) 01/11/2017 1424   CHOLHDL 6.4 (H) 01/11/2017 1424   VLDL 30 01/11/2017 1424   LDLCALC 160 (H) 01/11/2017 1424    Assessment: [name] is here today to follow-up with [name] for her bipolar depression.  [name] asked me to see her regarding the drug interaction between [name] and her [name] that she takes for her bipolar depression.  She states that the pharmacy alerted her that there was a drug interaction.  She is correct - Genvoya and Latuda are contraindicated together as Genvoya could significantly increase the concentration of Latuda.  She states she thinks she's having side effects associated with the drug  interaction as well.  After discussion with the patient and [name], will change the patient's HIV medications as most medications for bipolar disorder will interact with the cobcistat component of Genvoya.  She is on Genvoya/Prezista and does have some resistance.  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  I do not see any other resistance for her.  After discussion, I will switch her to [facility].  I completed a prior auth for the [place] and it was approved.  She gets her medications from [facility] in [place], [place] [phone]. I explained the medication to the patient and told her to stop the [place] and Prezista  when she gets the [place] in the mail.  [facility] also told [facility] to d[facility] the Genvoya and Prezista.  I activated a co-pay card for her and her co-pay is $0. She will come back and see me in 3-4 weeks for adherence and labs. We may be able to increase her Latuda dose at that point too.   Plans: - Stop Genvoya and Prezista - Start Biktarvy PO once daily - Continue Latuda 20 mg PO daily - F/u with me 8/14 at 2:30pm  [name], PharmD, [facility] 03/07/2017, 4:29 PM

## 2017-03-08 NOTE — Progress Notes (Signed)
Integrated Behavioral Health Follow Up Visit  MRN: [id number] Name: [name]   Session Start time: 9:04 am Session End time: 10:05 am Total time: 1 hour Number of Integrated Behavioral Health Clinician visits: 2/10  Type of Service: [place] Interpretor:No. Interpretor Name and Language: N/A   Warm Hand Off Completed.       SUBJECTIVE: [name] is a 36 y.o. female accompanied by patient. Patient was referred by Dr. Johnnye Sima for depressive symptoms. Patient reported happy mood, although she presented with psychomotor retardation and with flat affect.  Again patient reported that it was due to a lack of coffee and lack of energy.  Patient reported leaving her daughter's father's house and returning home because he is remodeling the kitchen.  Patient verbalized feeling safe being at her house.  Patient reported medication compliance with the Lutuda but denied symptom improvement.  Patient also reported her Pharmacist telling her that the Lutuda is contraindicated with her Genvoya HIV medication and that she experienced dizziness, lightheadedness, and a feeling of high blood pressure.  Patient was receptive and active in reviewing the Dealing with Depression hand out in discussing strategies in management of depression.  Patient was active in processing her desire to give her children the opportunity to have a better childhood than she had.  Patient was also receptive to processing the need for balance in raising children, using "No" to teach boundaries, and considering the worst case scenairo.  Patient verbalized her limitations with boundary setting and saying "No" and asked to practice during future sessions.  Severity of problem: severe  OBJECTIVE: Mood: Depressed and Affect: Flat  Patient is presenting as less depressed and was able to laugh and smile as appropriate Risk of harm to self or others: No plan to harm self or others   Thought process: tangential Thought content: logical  ASSESSMENT: Patient is currently experiencing severe symptoms of depression and may benefit from mental health services, a psy eval, and medication management.   GOALS ADDRESSED: Patient will reduce symptoms of: depression and increase knowledge and/or ability of: coping skills and healthy habits and also: Increase healthy adjustment to current life circumstances, Increase adequate support systems for patient/family and Improve medication compliance  INTERVENTIONS: Brief CBT  Referred to Pharmacy for review of medications.  Review of coping strategies for depression.  PLAN: 1. Follow up with behavioral health clinician on : Monday July 30th at 8:45 am 2. Behavioral recommendations: Begin to use coping strategies. 3. Referral(s): Pharmacist to review current medications   [name], [facility]

## 2017-03-14 ENCOUNTER — Telehealth: Payer: Self-pay | Admitting: Licensed Clinical Social Worker

## 2017-03-14 ENCOUNTER — Ambulatory Visit (HOSPITAL_COMMUNITY)
Admission: RE | Admit: 2017-03-14 | Discharge: 2017-03-14 | Disposition: A | Discharge status: Home / Self Care | Payer: BC Managed Care – PPO | Attending: Psychiatry | Admitting: Psychiatry

## 2017-03-14 ENCOUNTER — Ambulatory Visit (INDEPENDENT_AMBULATORY_CARE_PROVIDER_SITE_OTHER): Payer: BC Managed Care – PPO | Admitting: Licensed Clinical Social Worker

## 2017-03-14 DIAGNOSIS — Z8249 Family history of ischemic heart disease and other diseases of the circulatory system: Secondary | ICD-10-CM | POA: Diagnosis not present

## 2017-03-14 DIAGNOSIS — Z21 Asymptomatic human immunodeficiency virus [HIV] infection status: Secondary | ICD-10-CM | POA: Diagnosis not present

## 2017-03-14 DIAGNOSIS — Z91040 Latex allergy status: Secondary | ICD-10-CM | POA: Insufficient documentation

## 2017-03-14 DIAGNOSIS — F419 Anxiety disorder, unspecified: Secondary | ICD-10-CM | POA: Insufficient documentation

## 2017-03-14 DIAGNOSIS — E119 Type 2 diabetes mellitus without complications: Secondary | ICD-10-CM | POA: Insufficient documentation

## 2017-03-14 DIAGNOSIS — K219 Gastro-esophageal reflux disease without esophagitis: Secondary | ICD-10-CM | POA: Insufficient documentation

## 2017-03-14 DIAGNOSIS — F322 Major depressive disorder, single episode, severe without psychotic features: Secondary | ICD-10-CM

## 2017-03-14 DIAGNOSIS — J45909 Unspecified asthma, uncomplicated: Secondary | ICD-10-CM | POA: Insufficient documentation

## 2017-03-14 DIAGNOSIS — Z915 Personal history of self-harm: Secondary | ICD-10-CM | POA: Insufficient documentation

## 2017-03-14 DIAGNOSIS — Z79899 Other long term (current) drug therapy: Secondary | ICD-10-CM | POA: Diagnosis not present

## 2017-03-14 DIAGNOSIS — F329 Major depressive disorder, single episode, unspecified: Secondary | ICD-10-CM | POA: Diagnosis not present

## 2017-03-14 DIAGNOSIS — Z882 Allergy status to sulfonamides status: Secondary | ICD-10-CM | POA: Diagnosis not present

## 2017-03-14 DIAGNOSIS — Z888 Allergy status to other drugs, medicaments and biological substances status: Secondary | ICD-10-CM | POA: Diagnosis not present

## 2017-03-14 DIAGNOSIS — M1990 Unspecified osteoarthritis, unspecified site: Secondary | ICD-10-CM | POA: Diagnosis not present

## 2017-03-14 DIAGNOSIS — Z8261 Family history of arthritis: Secondary | ICD-10-CM | POA: Insufficient documentation

## 2017-03-14 NOTE — Progress Notes (Signed)
Integrated Behavioral Health Follow Up Visit  MRN: [id number] Name: [name]   Session Start time: 8:52 am Session End time: 10:00 am Total time: 1 hour, 8 mins Number of Integrated Behavioral Health Clinician visits: 5/10  Type of Service: [place] Interpretor:No. Interpretor Name and Language: N/A   Warm Hand Off Completed.       SUBJECTIVE: [name] is a 36 y.o. female accompanied by patient. Patient was referred by Dr. Johnnye Sima for depressive symptoms.  Today patient and [facility] reviewed her recent medication change of new HIV medication.  Patient denied current experience of dizziness and lightheadedness, however reported new mania symptoms.  Patient completed the Mood Disorder Questionnaire on paper and reported current symptoms of: irritability leading to shouting and fights, hyperverbal speech, racing thoughts, being easily distractible, increase in energy, and more desire for sex.  Additionally the patient reported experience of fleeting suicidal ideations, without plan or intent.  Patient completed the Suicide Behavior Questionnaire today and reported the intensity of the ideations a 3 out of 5.  Patient denied that the ideations were related to anything but stated that she irritable beforehand.  Patient reported that she felt like she did not want to live but denied developing a plan.  Patient reported that she shared her suicidal ideations with her daughter's father Will and reported that she left his house and went home.  Patient denied that she utilized the safety plan developed during counseling session (to report to ED, call 911, call mobile crisis or suicide hotlines) but could not provide reason why.  Patient reported that the ideations went away and that she has been praying.  Patient presented with poor insight into her inability to follow safety protocols and be concerned for her safety and was not receptive to  discussion about need for level 3 treatment due to increasing suicidal ideations, current medication concerns and lack of efficacy, and lack of care coordination by a psychiatrist.  [facility] informed patient of need to increase level of care services beyond outpatient weekly counseling and provided patient with transportation options to be evaluated at the ED.  Patient desired to be transported by Will her daughter's father and called him to inform him of the situation.  Will presented to the office and [facility] received verbal consent from the patient to discuss this medical situation with him.  Will stated that he would directly transport the patient to either [facility] or [facility] for evaluation.  [facility] provided Will with business card and invited him to share medical information related to mental health symptoms with the [facility].  Both the patient and Will were receptive.  Severity of problem: severe  OBJECTIVE: Mood: Depressed and Affect: Tearful and Flat Risk of harm to self or others: Suicidal ideation No plan to harm self or others  Thought process: conherent Thought content: logical  ASSESSMENT: Patient is currently experiencing severe symptoms of depression and suicidal ideations and may benefit from mental health services, a psy eval, and medication management.  GOALS ADDRESSED: Patient will reduce symptoms of: depression and suicidal ideations and increase knowledge and/or ability of: coping skills and healthy habits and also: Increase healthy adjustment to current life circumstances, Increase adequate support systems for patient/family and Increase motivation to adhere to plan of care  INTERVENTIONS: Supportive Counseling Standardized Assessments completed: SBQ-R and the Anti-psychotic Side Effect Scale and the Mood Disorder Questionnaire on paper.   PLAN: 1. Behavioral recommendations: Patient will report directly to the ED  today for mental health evaluation due to suicidal ideations and  severe depression 2. Referral(s): [facility] or [facility] 3. "From scale of 1-10, how likely are you to follow plan?": [place], [facility]

## 2017-03-14 NOTE — Telephone Encounter (Signed)
Left message for patient asking for return call to inform of which hospital ED went to.  [name], [facility]

## 2017-03-14 NOTE — H&P (Signed)
Behavioral Health Medical Screening Exam  [name] is an 36 y.o. female with a PMH of depression arrived voluntarily to [facility] accompanied by her significant other with complaint of her current depression medication (Latuda 20 mg) not working. Patient requesting medication adjustment, she denies any SI/HI/VAH. Patient already have OP provider she see, she will follow up with them and seek for a sooner appointment.  Total Time spent with patient: 30 minutes  Psychiatric Specialty Exam: Physical Exam  Constitutional: She is oriented to person, place, and time. She appears well-developed and well-nourished.  HENT:  Head: Normocephalic and atraumatic.  Eyes: Pupils are equal, round, and reactive to light. Conjunctivae are normal.  Neck: Normal range of motion. Neck supple.  Cardiovascular: Normal rate and regular rhythm.   Respiratory: Effort normal and breath sounds normal.  GI: Soft. Bowel sounds are normal.  Genitourinary:  Genitourinary Comments: Deferred  Musculoskeletal: Normal range of motion.  Neurological: She is alert and oriented to person, place, and time.  Skin: Skin is warm and dry.    ROS  Blood pressure 134/78, pulse 69, temperature 98.9 F (37.2 C), temperature source Oral, resp. rate 16, SpO2 100 %.There is no height or weight on file to calculate BMI.  General Appearance: Casual and unremarkabe  Eye Contact:  Good  Speech:  Clear and Coherent and Normal Rate  Volume:  Normal  Mood:  Euthymic  Affect:  Congruent  Thought Process:  Coherent and Goal Directed  Orientation:  Full (Time, Place, and Person)  Thought Content:  WDL and Logical  Suicidal Thoughts:  No  Homicidal Thoughts:  No  Memory:  Immediate;   Good Recent;   Good Remote;   Fair  Judgement:  Good  Insight:  Good and Present  Psychomotor Activity:  Normal  Concentration: Concentration: Good and Attention Span: Good  Recall:  Good  Fund of Knowledge:Good  Language: Good  Akathisia:  Negative   Handed:  Right  AIMS (if indicated):     Assets:  Communication Skills Desire for Improvement Financial Resources/Insurance Housing Intimacy Leisure Time Physical Health Resilience  Sleep:       Musculoskeletal: Strength & Muscle Tone: within normal limits Gait & Station: normal Patient leans: Right  Blood pressure 134/78, pulse 69, temperature 98.9 F (37.2 C), temperature source Oral, resp. rate 16, SpO2 100 %.  Recommendations:  Based on my evaluation the patient does not appear to have an emergency medical condition.  Other OP resources were provided to patient.  Vicenta Aly, NP 03/14/2017, 1:08 PM

## 2017-03-14 NOTE — BH Assessment (Signed)
Assessment Note  [name] is an 36 y.o. female who presented to [facility] walk in with depression. She reports she was started on [place] a few weeks ago. She reports her depression symptoms have increased since starting the medications. She reports her physician who treats her HIV does not feel comfortable making changes to her medications. She does not currently have an outpatient psychiatrist. She states her current counselor suggested pt to be evaluated at [facility] for medication changes. She denies current SI, HI, substance abuse and AVH. She has one prior suicide attempt 12 years ago. She does not have any prior psychiatric admissions. She reports she has an appointment with a psychiatrist in September. She receives weekly therapy with Sande Rives.   Per Kelby Fam, NP pt does not meet criteria for inpatient admission.   Diagnosis: Major Depression  Past Medical History:  Past Medical History:  Diagnosis Date  . Allergy   . Anemia   . Anxiety   . Anxiety state, unspecified 12/07/2012  . Arthritis   . Asthma   . Bipolar disorder, unspecified ([place]) 12/07/2012  . Depression   . Diabetes mellitus without complication ([place])    no meds currently- controlled with diet  . GERD (gastroesophageal reflux disease)   . GERD (gastroesophageal reflux disease) 12/07/2012  . Headache(784.0)    migraines  . Herpes genitalis in women 12/07/2012  . HIV infection ([place])   . Human immunodeficiency virus (HIV) disease ([place]) 12/07/2012  . Neuromuscular disorder ([place])    carpal tunnel in both arms  . Type II or unspecified type diabetes mellitus without mention of complication, not stated as uncontrolled 12/07/2012  . Unspecified asthma(493.90) 12/07/2012    Past Surgical History:  Procedure Laterality Date  . CESAREAN SECTION    . COLON SURGERY     colon repair after salpingo  . INSERTION OF MESH N/A 11/06/2014   Procedure: INSERTION OF MESH;  Surgeon: Stark Klein, MD;  Location: [facility];  Service: General;   Laterality: N/A;  . LAPAROSCOPIC HEPATECTOMY N/A 06/28/2013   Procedure: LAPAROSCOPIC EXCISION HEPATIC MASS;  Surgeon: Stark Klein, MD;  Location: [place];  Service: General;  Laterality: N/A;  . LAPAROSCOPIC LYSIS OF ADHESIONS N/A 12/04/2012   Procedure: LAPAROSCOPIC LYSIS OF ADHESIONS;  Surgeon: [name], MD;  Location: [place] [facility];  Service: Gynecology;  Laterality: N/A;  . LAPAROTOMY N/A 12/04/2012   Procedure: EXPLORATORY LAPAROTOMY;  Surgeon: [name], MD;  Location: [place] [facility];  Service: Gynecology;  Laterality: N/A;  repair of serosal injury  . ROBOTIC ASSISTED BILATERAL SALPINGO OOPHERECTOMY Right 12/04/2012   Procedure: ROBOTIC ASSISTED Right  SALPINGO OOPHORECTOMY;  Surgeon: [name], MD;  Location: [place] [facility];  Service: Gynecology;  Laterality: Right;  . VENTRAL HERNIA REPAIR N/A 11/06/2014   Procedure: LAPAROSCOPIC ASSITED INCARCERATED VENTRAL HERNIA REPAIR WITH MESH;  Surgeon: Stark Klein, MD;  Location[facility];  Service: General;  Laterality: N/A;    Family History:  Family History  Problem Relation Age of Onset  . Arthritis/Rheumatoid Mother   . Hypertension Mother   . Cancer Maternal Aunt        brand and lung  . Cancer Maternal Grandmother        breasts  . Cancer Paternal Grandmother        breast and kidney    Social History:  reports that she has never smoked. She has never used smokeless tobacco. She reports that she does not drink alcohol or use drugs.  Additional Social History:  Alcohol /  Drug Use Pain Medications: See MAR Prescriptions: See MAR Over the Counter: See MAR  History of alcohol / drug use?: No history of alcohol / drug abuse  CIWA: CIWA-Ar BP: 134/78 Pulse Rate: 69 COWS:    Allergies:  Allergies  Allergen Reactions  . Benzoin Other (See Comments)    blisters  . Latex Rash and Other (See Comments)  . Sulfamethoxazole-Trimethoprim Itching and Nausea And Vomiting    Home Medications:  (Not in a hospital  admission)  OB/GYN Status:  No LMP recorded. Patient has had an implant.  General Assessment Data Location of Assessment: [facility] Assessment Services TTS Assessment: In system Is this a Tele or Face-to-Face Assessment?: Face-to-Face Is this an Initial Assessment or a Re-assessment for this encounter?: Initial Assessment Marital status: Single Is patient pregnant?: No Pregnancy Status: No Living Arrangements: Alone Can pt return to current living arrangement?: Yes Admission Status: Voluntary Is patient capable of signing voluntary admission?: Yes Referral Source: MD Insurance type: [place] Screening Exam ([place]) Medical Exam completed: Yes  Crisis Care Plan Living Arrangements: Alone Name of Psychiatrist: NA Name of Therapist: Sande Rives  Education Status Is patient currently in school?: No Highest grade of school patient has completed: 12th   Risk to self with the past 6 months Suicidal Ideation: No Has patient been a risk to self within the past 6 months prior to admission? : No Suicidal Intent: No Has patient had any suicidal intent within the past 6 months prior to admission? : No Is patient at risk for suicide?: No Suicidal Plan?: No Has patient had any suicidal plan within the past 6 months prior to admission? : No Access to Means: No What has been your use of drugs/alcohol within the last 12 months?: None  Previous Attempts/Gestures: Yes How many times?: 1 Triggers for Past Attempts: Unknown Intentional Self Injurious Behavior: None Family Suicide History: No Recent stressful life event(s): Other (Comment) (Medication changes ) Persecutory voices/beliefs?: No Depression: Yes Depression Symptoms: Loss of interest in usual pleasures, Isolating, Despondent Substance abuse history and/or treatment for substance abuse?: No Suicide prevention information given to non-admitted patients: Yes  Risk to Others within the past 6 months Homicidal  Ideation: No Does patient have any lifetime risk of violence toward others beyond the six months prior to admission? : No Thoughts of Harm to Others: No Current Homicidal Intent: No Current Homicidal Plan: No Access to Homicidal Means: No History of harm to others?: No Assessment of Violence: None Noted Does patient have access to weapons?: No Criminal Charges Pending?: No Does patient have a court date: No Is patient on probation?: No  Psychosis Hallucinations: None noted Delusions: None noted  Mental Status Report Appearance/Hygiene: Unremarkable Eye Contact: Good Motor Activity: Unremarkable Speech: Logical/coherent Level of Consciousness: Alert Mood: Anxious Affect: Appropriate to circumstance Anxiety Level: Minimal Thought Processes: Coherent, Relevant Judgement: Unimpaired Orientation: Person, Place, Time, Situation Obsessive Compulsive Thoughts/Behaviors: None  Cognitive Functioning Concentration: Normal Memory: Recent Intact, Remote Intact IQ: Average Insight: Good Impulse Control: Good Appetite: Good Weight Loss: 0 Weight Gain: 0 Sleep: No Change Vegetative Symptoms: None  ADLScreening [facility] Assessment Services) Patient's cognitive ability adequate to safely complete daily activities?: Yes Patient able to express need for assistance with ADLs?: Yes Independently performs ADLs?: Yes (appropriate for developmental age)  Prior Inpatient Therapy Prior Inpatient Therapy: No  Prior Outpatient Therapy Prior Outpatient Therapy: Yes Prior Therapy Dates: 3 years ago Prior Therapy Facilty/Provider(s): [facility] Reason for Treatment: depression  Does patient have an ACCT team?: No Does patient have Intensive In-House Services?  : No Does patient have [facility] services? : No Does patient have P4CC services?: No  ADL Screening (condition at time of admission) Patient's cognitive ability adequate to safely complete daily activities?: Yes Is the  patient deaf or have difficulty hearing?: No Does the patient have difficulty seeing, even when wearing glasses/contacts?: No Does the patient have difficulty concentrating, remembering, or making decisions?: No Patient able to express need for assistance with ADLs?: Yes Does the patient have difficulty dressing or bathing?: No Independently performs ADLs?: Yes (appropriate for developmental age) Does the patient have difficulty walking or climbing stairs?: No Weakness of Legs: None Weakness of Arms/Hands: None  Home Assistive Devices/Equipment Home Assistive Devices/Equipment: None  Therapy Consults (therapy consults require a physician order) PT Evaluation Needed: No OT Evalulation Needed: No SLP Evaluation Needed: No Abuse/Neglect Assessment (Assessment to be complete while patient is alone) Physical Abuse: Denies Verbal Abuse: Denies Sexual Abuse: Denies Exploitation of patient/patient's resources: Denies Self-Neglect: Denies Values / Beliefs Cultural Requests During Hospitalization: None Spiritual Requests During Hospitalization: None Consults Spiritual Care Consult Needed: No Social Work Consult Needed: No Regulatory affairs officer (For Healthcare) Does Patient Have a Medical Advance Directive?: No Would patient like information on creating a medical advance directive?: No - Patient declined Nutrition Screen- [place] [facility] Patient's home diet: Regular Has the patient recently lost weight without trying?: No Has the patient been eating poorly because of a decreased appetite?: No Malnutrition Screening Tool Score: 0  Additional Information 1:1 In Past 12 Months?: No CIRT Risk: No Elopement Risk: No Does patient have medical clearance?: Yes     Disposition:  Disposition Initial Assessment Completed for this Encounter: Yes Disposition of Patient: Outpatient treatment Type of outpatient treatment: Adult  On Site Evaluation by:   Reviewed with Physician:    Wray Kearns MSW, LCSW  03/14/2017 1:09 PM

## 2017-03-15 ENCOUNTER — Other Ambulatory Visit (HOSPITAL_COMMUNITY)
Admission: RE | Admit: 2017-03-15 | Discharge: 2017-03-15 | Disposition: A | Discharge status: Home / Self Care | Payer: BC Managed Care – PPO | Source: Ambulatory Visit | Attending: Family Medicine | Admitting: Family Medicine

## 2017-03-15 ENCOUNTER — Ambulatory Visit (INDEPENDENT_AMBULATORY_CARE_PROVIDER_SITE_OTHER): Payer: BC Managed Care – PPO | Admitting: Family Medicine

## 2017-03-15 ENCOUNTER — Encounter: Payer: Self-pay | Admitting: Family Medicine

## 2017-03-15 VITALS — BP 112/72 | HR 72 | Temp 98.0°F | Resp 14 | Ht 64.0 in | Wt 196.0 lb

## 2017-03-15 DIAGNOSIS — F3162 Bipolar disorder, current episode mixed, moderate: Secondary | ICD-10-CM | POA: Diagnosis not present

## 2017-03-15 DIAGNOSIS — L298 Other pruritus: Secondary | ICD-10-CM | POA: Diagnosis not present

## 2017-03-15 DIAGNOSIS — N898 Other specified noninflammatory disorders of vagina: Secondary | ICD-10-CM

## 2017-03-15 MED ORDER — LAMOTRIGINE ER 50 MG PO TB24: 50.0000 mg | ORAL_TABLET | Freq: Every day | ORAL | 0 refills | Status: DC

## 2017-03-15 NOTE — Progress Notes (Signed)
Patient ID: [name], female    DOB: 1981/01/16, 36 y.o.   MRN: [id number]  PCP: Scot Jun, FNP  Chief Complaint  Patient presents with  . Medication Problem    Lutuda  . Follow-up    headaches    Subjective:  HPI [name] Neal is a 36 y.o. female presents for evaluation of bipolar disorder and headache follow-up. Medical problems include: Chronic Headaches, HIV, Anxiety, Bipolar disorder. [name] Neal reports that she recently was advised of a medication interaction between her HIV medication and Latuda. She has already followed up with ID and her HIV medication regimen has been changed. However, [name] Neal reports that the Latuda alone, at present is not controlling her symptoms of depression, irritability , and aggression. She recently reached out to behavioral health and she has a follow-up scheduled with a counselor 04/14/2017 and psychiatrist 05/12/2017. [facility] referred patient back to PCP for temporary medication management of mixed symptoms.she admits that at some point she had independently discontinued Latuda and has not received psychiatric services in several years. She reports that a prior provider agreed to resume her Latuda under the condition that she would follow-up with BH. [name] Neal denies thoughts of suicide and or thoughts of harming others. She reports associate decreased in appetite over the last sever weeks. Denies sleep disturbances. [name] Neal also suffers from chronic migraine type headaches and is currently treated with Topomax and Rizatriptan. She reports that her headaches have significantly improved with this current regimen. Headaches average around 2-3 per month now with an improved intensity compared to previous headaches.   Social History   Social History  . Marital status: Single    Spouse name: N/A  . Number of children: 1  . Years of education: 64   Occupational History  . Not on file.   Social History Main Topics  . Smoking status: Never Smoker   . Smokeless tobacco: Never Used  . Alcohol use No     Comment: OCC.  . Drug use: No  . Sexual activity: Yes    Partners: Male    Birth control/ protection: Implant   Other Topics Concern  . Not on file   Social History Narrative   Patient lives alone.   Patient works at home.   Patient has hs education.   Patient has 1 child.   Patient drinks caffeine once in a while.    Family History  Problem Relation Age of Onset  . Arthritis/Rheumatoid Mother   . Hypertension Mother   . Cancer Maternal Aunt        brand and lung  . Cancer Maternal Grandmother        breasts  . Cancer Paternal Grandmother        breast and kidney   Review of Systems See HPI  Patient Active Problem List   Diagnosis Date Noted  . Chronic tension headaches 01/23/2017  . Acute upper respiratory infection 06/21/2016  . Hyperlipidemia 11/17/2015  . Sinusitis 06/30/2015  . Hernia, abdominal 08/05/2014  . Vaginitis and vulvovaginitis 06/19/2014  . 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 Lung abscess ([place]) 06/26/2013  . 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   05/07/2013  . Ovarian cystic mass 05/07/2013  . s/p Serosal repair of colon 12/04/2012 12/15/2012  . Herpes genitalis in women 12/07/2012  . Anxiety state, unspecified 12/07/2012  . Unspecified asthma(493.90) 12/07/2012  . GERD (gastroesophageal reflux disease) 12/07/2012  . Pelvic mass in female s/p Robotic-assisted right salpingo-oophorectomy  12/04/2012  . SHINGLES 01/31/2009  . HEMORRHOIDS NOS, W/O COMPLICATIONS 32/95/1884  . Depression 11/25/2006  . Allergic rhinitis 11/25/2006  . 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 Human immunodeficiency virus (HIV) disease ([place]) 11/10/2006    Allergies  Allergen Reactions  . Benzoin Other (See Comments)    blisters  . Latex Rash and Other (See Comments)  . Sulfamethoxazole-Trimethoprim Itching and Nausea And Vomiting    Prior to Admission  medications   Medication Sig Start Date End Date Taking? Authorizing Provider  beclomethasone (QVAR) 40 MCG/ACT inhaler INHALE TWO PUFFS INTO THE LUNGS TWO TIMES DAILY. 12/23/16  Yes Michel Bickers, MD  bictegravir-emtricitabine-tenofovir AF (BIKTARVY) 50-200-25 MG TABS tablet Take 1 tablet by mouth daily. 03/07/17  Yes Kuppelweiser, Cassie L, RPH  fexofenadine (ALLEGRA ALLERGY) 180 MG tablet Take 1 tablet (180 mg total) by mouth daily. 04/28/16  Yes Micheline Chapman, NP  lubiprostone (AMITIZA) 8 MCG capsule Take 8 mcg by mouth 2 (two) times daily with a meal.   Yes [provider]  lurasidone (LATUDA) 20 MG TABS tablet Take 1 tablet (20 mg total) by mouth daily. 02/28/17  Yes Campbell Riches, MD  Norethindrone Acetate-Ethinyl Estradiol (LOESTRIN 1.5/30, 21,) 1.5-30 MG-MCG tablet Take 1 tablet by mouth daily. 12/28/16  Yes Lavonia Drafts, MD  oxymetazoline (AFRIN) 0.05 % nasal spray Place 1 spray into both nostrils 2 (two) times daily. For <7 days at a time.   Yes [provider]  PROAIR HFA 108 (90 Base) MCG/ACT inhaler INHALE TWO PUFFS INTO THE LUNGS EVERY 6 HOURS AS NEEDED FOR WHEEZING. 08/30/16  Yes Micheline Chapman, NP  rizatriptan (MAXALT-MLT) 5 MG disintegrating tablet Take 2 tablets (10 mg total) by mouth as needed for migraine. May repeat in 2 hours if needed 01/20/17  Yes Scot Jun, FNP  tiZANidine (ZANAFLEX) 4 MG capsule Take 1 capsule (4 mg total) by mouth 3 (three) times daily as needed for muscle spasms. 01/20/17  Yes Scot Jun, FNP  Topiramate ER 100 MG CS24 Take 100 mg by mouth at bedtime. 01/20/17  Yes Scot Jun, FNP  triamcinolone cream (KENALOG) 0.5 % APPLY 1 APPLICATION TOPICALLY 3 (THREE) TIMES DAILY. 01/11/17  Yes Scot Jun, FNP  valACYclovir (VALTREX) 500 MG tablet TAKE 1 TABLET (500 MG TOTAL) BY MOUTH DAILY. 03/29/16  Yes Campbell Riches, MD  cetirizine (ZYRTEC) 10 MG tablet Take 10 mg by mouth daily.    [provider]  montelukast (SINGULAIR) 10 MG tablet Take 1 tablet (10 mg total) by mouth at bedtime. Patient not taking: Reported on 03/15/2017 06/21/16   Michel Bickers, MD    Past Medical, Surgical Family and Social History reviewed and updated.    Objective:   Today's Vitals   03/15/17 0824  BP: 112/72  Pulse: 72  Resp: 14  Temp: 98 F (36.7 C)  TempSrc: Oral  SpO2: 100%  Weight: 196 lb (88.9 kg)  Height: 5\' 4"  (1.626 m)    Wt Readings from Last 3 Encounters:  03/15/17 196 lb (88.9 kg)  02/07/17 188 lb (85.3 kg)  01/20/17 193 lb 9.6 oz (87.8 kg)    Physical Exam  Constitutional: She is oriented to person, place, and time. She appears well-developed and well-nourished.  HENT:  Head: Normocephalic and atraumatic.  Eyes: Pupils are equal, round, and reactive to light. Conjunctivae and EOM are normal.  Neck: Normal range of motion. Neck supple.  Cardiovascular: Normal rate, regular rhythm, normal heart sounds and intact  distal pulses.   Pulmonary/Chest: Effort normal and breath sounds normal.  Neurological: She is alert and oriented to person, place, and time.  Skin: Skin is warm and dry.  Psychiatric: Her speech is normal and behavior is normal. Judgment and thought content normal.  Behavior is appropriate today. Speech, content, and interaction appropriate. She displays appropriate judgment today and does not display behaviors of impulsivity or inappropriateness.     Assessment & Plan:  1. Bipolar mixed affective disorder, moderate (HCC) According to first -line treatment recommendations for Bipolar disorder, will trial patient with anticonvulsant therapy as an adjunct to antipsychotic medication. This medication trial is temporary until patient follows-up with psychiatry.  -Continue Latuda 20 mg once daily at bedtime. -Start Lamictal 50 mg once daily at bedtime.  2. Vaginal itching, patient did not close symptoms of vaginal itching during visit. She requests a vaginal  swab from the nurse out of my presence and specimen was self collect.  Placing order for Cervicovaginal ancillary only,pending.  RTC: 2 weeks for Bipolar follow-up and medication evaluation.  A total of 25 minutes spent, greater than 50 % of this time was spent reviewing prior medical history, reviewing medications and indications of treatment, prior labs and diagnostic tests, discussing current plan of treatment, health promotion, and goals of treatment.   Carroll Sage. Kenton Kingfisher, MSN, FNP-C [facility]  [address] [place], [place] [address] [phone]

## 2017-03-15 NOTE — Patient Instructions (Signed)
Continue Latuda and I am adding Lamitcal 50 mg. Take both medications at bedtime. Return in 2 weeks for medication follow-up.  Please go to the ED or call the office if you experience any symptoms or worsening aggression or thoughts of suicide or harm to others.    Bipolar 1 Disorder Bipolar 1 disorder is a mental health disorder in which a person has episodes of emotional highs (mania), and may also have episodes of emotional lows (depression) in addition to highs. Bipolar 1 disorder is different from other bipolar disorders because it involves extreme manic episodes. These episodes last at least one week or involve symptoms that are so severe that hospitalization is needed to keep the person safe. What increases the risk? The cause of this condition is not known. However, certain factors make you more likely to have bipolar disorder, such as:  Having a family member with the disorder.  An imbalance of certain chemicals in the brain (neurotransmitters).  Stress, such as illness, financial problems, or a death.  Certain conditions that affect the brain or spinal cord (neurologic conditions).  Brain injury (trauma).  Having another mental health disorder, such as: ? Obsessive compulsive disorder. ? Schizophrenia.  What are the signs or symptoms? Symptoms of mania include:  Very high self-esteem or self-confidence.  Decreased need for sleep.  Unusual talkativeness or feeling a need to keep talking. Speech may be very fast. It may seem like you cannot stop talking.  Racing thoughts or constant talking, with quick shifts between topics that may or may not be related (flight of ideas).  Decreased ability to focus or concentrate.  Increased purposeful activity, such as work, studies, or social activity.  Increased nonproductive activity. This could be pacing, squirming and fidgeting, or finger and toe tapping.  Impulsive behavior and poor judgment. This may result in high-risk  activities, such as having unprotected sex or spending a lot of money.  Symptoms of depression include:  Feeling sad, hopeless, or helpless.  Frequent or uncontrollable crying.  Lack of feeling or caring about anything.  Sleeping too much.  Moving more slowly than usual.  Not being able to enjoy things you used to enjoy.  Wanting to be alone all the time.  Feeling guilty or worthless.  Lack of energy or motivation.  Trouble concentrating or remembering.  Trouble making decisions.  Increased appetite.  Thoughts of death, or the desire to harm yourself.  Sometimes, you may have a mixed mood. This means having symptoms of depression and mania. Stress can make symptoms worse. How is this diagnosed? To diagnose bipolar disorder, your health care provider may ask about your:  Emotional episodes.  Medical history.  Alcohol and drug use. This includes prescription medicines. Certain medical conditions and substances can cause symptoms that seem like bipolar disorder (secondary bipolar disorder).  How is this treated? Bipolar disorder is a long-term (chronic) illness. It is best controlled with ongoing (continuous) treatment rather than treatment only when symptoms occur. Treatment may include:  Medicine. Medicine can be prescribed by a provider who specializes in treating mental disorders (psychiatrist). ? Medicines called mood stabilizers are usually prescribed. ? If symptoms occur even while taking a mood stabilizer, other medicines may be added.  Psychotherapy. Some forms of talk therapy, such as cognitive-behavioral therapy (CBT), can provide support, education, and guidance.  Coping methods, such as journaling or relaxation exercises. These may include: ? Yoga. ? Meditation. ? Deep breathing.  Lifestyle changes, such as: ? Limiting alcohol and drug use. ?  Exercising regularly. ? Getting plenty of sleep. ? Making healthy eating choices.  A combination of  medicine, talk therapy, and coping methods is best. A procedure in which electricity is applied to the brain through the scalp (electroconvulsive therapy) may be used in cases of severe mania when medicine and psychotherapy work too slowly or do not work. Follow these instructions at home: Activity   Return to your normal activities as told by your health care provider.  Find activities that you enjoy, and make time to do them.  Exercise regularly as told by your health care provider. Lifestyle  Limit alcohol intake to no more than 1 drink a day for nonpregnant women and 2 drinks a day for men. One drink equals 12 oz of beer, 5 oz of wine, or 1 oz of hard liquor.  Follow a set schedule for eating and sleeping.  Eat a balanced diet that includes fresh fruits and vegetables, whole grains, low-fat dairy, and lean meat.  Get 7-8 hours of sleep each night. General instructions  Take over-the-counter and prescription medicines only as told by your health care provider.  Think about joining a support group. Your health care provider may be able to recommend a support group.  Talk with your family and loved ones about your treatment goals and how they can help.  Keep all follow-up visits as told by your health care provider. This is important. Where to find more information: For more information about bipolar disorder, visit the following websites:  [employer] on Mental Illness: www.nami.[place]: https://carter.com/  Contact a health care provider if:  Your symptoms get worse.  You have side effects from your medicine, and they get worse.  You have trouble sleeping.  You have trouble doing daily activities.  You feel unsafe in your surroundings.  You are dealing with substance abuse. Get help right away if:  You have new symptoms.  You have thoughts about harming yourself.  You self-harm. This information is not intended to  replace advice given to you by your health care provider. Make sure you discuss any questions you have with your health care provider. Document Released: 11/08/2000 Document Revised: 03/28/2016 Document Reviewed: 04/01/2016 Elsevier Interactive Patient Education  [employer].

## 2017-03-16 ENCOUNTER — Telehealth: Payer: Self-pay

## 2017-03-16 ENCOUNTER — Telehealth: Payer: Self-pay | Admitting: Licensed Clinical Social Worker

## 2017-03-16 LAB — CERVICOVAGINAL ANCILLARY ONLY: Wet Prep (BD Affirm): POSITIVE — AB

## 2017-03-16 NOTE — Telephone Encounter (Signed)
Left message with patient asking for return phone call to see how symptoms are progressing and to give information to [facility] for walk-in assessments for counseling and medication management.  Also reminded patient that is scheduled for [facility] outpatient appointments for counseling on 8/30 at 8 am and for psychiatry on 9/27 at 1 pm.  Sande Rives, [facility]

## 2017-03-16 NOTE — Telephone Encounter (Signed)
Patient called asking if lamotrigine 50mg  that was prescribed for her yesterday can be changed. This medication is too expensive. Please advise if she can get something cheaper or that has a discount card she can use.

## 2017-03-17 ENCOUNTER — Telehealth: Payer: Self-pay

## 2017-03-17 MED ORDER — METRONIDAZOLE 0.75 % VA GEL: 1.0000 | Freq: Two times a day (BID) | VAGINAL | 0 refills | Status: DC

## 2017-03-17 MED ORDER — LAMOTRIGINE 25 MG PO TABS: 50.0000 mg | ORAL_TABLET | Freq: Every day | ORAL | 0 refills | Status: DC

## 2017-03-17 NOTE — Telephone Encounter (Signed)
I have reordered Lamictal non extended release.

## 2017-03-17 NOTE — Telephone Encounter (Signed)
Patient notified

## 2017-03-17 NOTE — Telephone Encounter (Signed)
Patient states that insurance won't cover the  Lamotrigine because it's the extended release and wants to know if it can be change it regular one

## 2017-03-18 ENCOUNTER — Other Ambulatory Visit: Payer: Self-pay | Admitting: Family Medicine

## 2017-03-18 MED ORDER — METRONIDAZOLE 500 MG PO TABS: 500.0000 mg | ORAL_TABLET | Freq: Two times a day (BID) | ORAL | 0 refills | Status: DC

## 2017-03-18 NOTE — Progress Notes (Signed)
D/C metrogel. Reordered metronidazole in pill form.

## 2017-03-20 DIAGNOSIS — F3162 Bipolar disorder, current episode mixed, moderate: Secondary | ICD-10-CM | POA: Insufficient documentation

## 2017-03-28 ENCOUNTER — Other Ambulatory Visit: Payer: Self-pay | Admitting: Pharmacist

## 2017-03-28 ENCOUNTER — Telehealth: Payer: Self-pay | Admitting: Pharmacist

## 2017-03-28 DIAGNOSIS — B20 Human immunodeficiency virus [HIV] disease: Secondary | ICD-10-CM

## 2017-03-28 NOTE — Telephone Encounter (Signed)
Rivka called and will not be able to come in tomorrow due to a new job with full hours.  She states she is doing well on the Biktarvy - no missed doses and no side effects.  She was just started on Lamictal as well by her PCP. No drug interactions. She will come in really quick for labs tomorrow morning to check a viral load on the new medication.

## 2017-03-29 ENCOUNTER — Ambulatory Visit: Payer: Self-pay | Admitting: Family Medicine

## 2017-03-29 ENCOUNTER — Ambulatory Visit: Payer: Self-pay

## 2017-03-29 ENCOUNTER — Other Ambulatory Visit: Payer: Self-pay

## 2017-03-30 ENCOUNTER — Telehealth: Payer: Self-pay

## 2017-03-30 NOTE — Telephone Encounter (Signed)
Called the office requesting a refill for Loestrin 1.5/30. Stated that the pharmacy requested that she gets a 90 day supply. Please return call.

## 2017-03-31 ENCOUNTER — Other Ambulatory Visit: Payer: Self-pay

## 2017-04-01 ENCOUNTER — Other Ambulatory Visit: Payer: Self-pay

## 2017-04-01 DIAGNOSIS — N921 Excessive and frequent menstruation with irregular cycle: Secondary | ICD-10-CM

## 2017-04-01 MED ORDER — NORETHINDRONE ACET-ETHINYL EST 1.5-30 MG-MCG PO TABS: 1.0000 | ORAL_TABLET | Freq: Every day | ORAL | 3 refills | Status: DC

## 2017-04-01 NOTE — Telephone Encounter (Signed)
Patient requested a 90 days supply of birth control pills. We have called this into [facility] [place].

## 2017-04-06 ENCOUNTER — Other Ambulatory Visit: Payer: BC Managed Care – PPO

## 2017-04-06 ENCOUNTER — Other Ambulatory Visit (HOSPITAL_COMMUNITY)
Admission: RE | Admit: 2017-04-06 | Discharge: 2017-04-06 | Disposition: A | Discharge status: Home / Self Care | Payer: BC Managed Care – PPO | Source: Ambulatory Visit | Attending: Infectious Diseases | Admitting: Infectious Diseases

## 2017-04-06 DIAGNOSIS — Z113 Encounter for screening for infections with a predominantly sexual mode of transmission: Secondary | ICD-10-CM | POA: Diagnosis not present

## 2017-04-06 LAB — CBC
HEMATOCRIT: 42.2 % (ref 35.0–45.0)
Hemoglobin: 14.3 g/dL (ref 11.7–15.5)
MCH: 32.5 pg (ref 27.0–33.0)
MCHC: 33.9 g/dL (ref 32.0–36.0)
MCV: 95.9 fL (ref 80.0–100.0)
MPV: 9.5 fL (ref 7.5–12.5)
PLATELETS: 299 10*3/uL (ref 140–400)
RBC: 4.4 MIL/uL (ref 3.80–5.10)
RDW: 14 % (ref 11.0–15.0)
WBC: 8.3 10*3/uL (ref 3.8–10.8)

## 2017-04-07 LAB — COMPREHENSIVE METABOLIC PANEL
ALK PHOS: 56 U/L (ref 33–115)
ALT: 26 U/L (ref 6–29)
AST: 16 U/L (ref 10–30)
Albumin: 4 g/dL (ref 3.6–5.1)
BILIRUBIN TOTAL: 0.5 mg/dL (ref 0.2–1.2)
BUN: 13 mg/dL (ref 7–25)
CO2: 18 mmol/L — ABNORMAL LOW (ref 20–32)
CREATININE: 1.23 mg/dL — AB (ref 0.50–1.10)
Calcium: 8.7 mg/dL (ref 8.6–10.2)
Chloride: 110 mmol/L (ref 98–110)
GLUCOSE: 101 mg/dL — AB (ref 65–99)
Potassium: 4.1 mmol/L (ref 3.5–5.3)
Sodium: 138 mmol/L (ref 135–146)
Total Protein: 6.6 g/dL (ref 6.1–8.1)

## 2017-04-07 LAB — T-HELPER CELL (CD4) - (RCID CLINIC ONLY)
CD4 % Helper T Cell: 30 % — ABNORMAL LOW (ref 33–55)
CD4 T Cell Abs: 970 /uL (ref 400–2700)

## 2017-04-07 LAB — LIPID PANEL
Cholesterol: 210 mg/dL — ABNORMAL HIGH (ref <200)
HDL: 36 mg/dL — ABNORMAL LOW (ref >50)
LDL Cholesterol: 149 mg/dL — ABNORMAL HIGH (ref <100)
Total CHOL/HDL Ratio: 5.8 Ratio — ABNORMAL HIGH (ref <5.0)
Triglycerides: 123 mg/dL (ref <150)
VLDL: 25 mg/dL (ref <30)

## 2017-04-07 LAB — RPR

## 2017-04-07 LAB — URINE CYTOLOGY ANCILLARY ONLY
Chlamydia: NEGATIVE
NEISSERIA GONORRHEA: NEGATIVE

## 2017-04-08 LAB — HIV-1 RNA QUANT-NO REFLEX-BLD
HIV 1 RNA Quant: <20 copies/mL — AB
HIV-1 RNA Quant, Log: <1.3 Log copies/mL — AB

## 2017-04-13 ENCOUNTER — Other Ambulatory Visit: Payer: Self-pay | Admitting: Family Medicine

## 2017-04-14 ENCOUNTER — Other Ambulatory Visit: Payer: Self-pay | Admitting: Pharmacist

## 2017-04-14 ENCOUNTER — Encounter (HOSPITAL_COMMUNITY): Payer: Self-pay | Admitting: Psychology

## 2017-04-14 ENCOUNTER — Telehealth: Payer: Self-pay | Admitting: Pharmacist

## 2017-04-14 ENCOUNTER — Encounter (HOSPITAL_COMMUNITY): Payer: Self-pay | Admitting: Psychiatry

## 2017-04-14 ENCOUNTER — Ambulatory Visit (HOSPITAL_COMMUNITY): Payer: BC Managed Care – PPO | Admitting: Psychology

## 2017-04-14 DIAGNOSIS — B20 Human immunodeficiency virus [HIV] disease: Secondary | ICD-10-CM

## 2017-04-14 NOTE — Telephone Encounter (Signed)
Thanks Cassie! You are doing exactly correct

## 2017-04-14 NOTE — Progress Notes (Signed)
[name] is a 36 y.o. female patient who didn't show for new pt appointment.         Jan Fireman, LPC

## 2017-04-14 NOTE — Telephone Encounter (Signed)
[name] called me today very, very concerned about her recent labs.  She had labs drawn on 8/22 - HIV viral load <20 and CD4 970. She is convinced that the [place] [facility] is not working for her.  She kept stating that her CD4 count was very low and concerning.  I reassured her many times that her CD4 count was perfect, her viral load was also great. She is still convinced it isn't working.   I switched her to [facility] ~7/23 due to drug interactions with the latuda and her Genvoya/Prezista that she was taking. She is wanting to switch medications again.  I convinced her to come in next week to have labs redrawn.  I will call the week after to discuss and if she wants to switch medications at that time, I will bring her in. Dr. Algis Downs first opening that she can make is first of November.

## 2017-04-18 ENCOUNTER — Encounter (HOSPITAL_COMMUNITY): Payer: Self-pay | Admitting: Emergency Medicine

## 2017-04-18 ENCOUNTER — Emergency Department (HOSPITAL_COMMUNITY)
Admission: EM | Admit: 2017-04-18 | Discharge: 2017-04-18 | Disposition: A | Discharge status: Home / Self Care | Payer: BC Managed Care – PPO | Attending: Emergency Medicine | Admitting: Emergency Medicine

## 2017-04-18 DIAGNOSIS — J45909 Unspecified asthma, uncomplicated: Secondary | ICD-10-CM | POA: Diagnosis not present

## 2017-04-18 DIAGNOSIS — E119 Type 2 diabetes mellitus without complications: Secondary | ICD-10-CM | POA: Insufficient documentation

## 2017-04-18 DIAGNOSIS — R21 Rash and other nonspecific skin eruption: Secondary | ICD-10-CM | POA: Insufficient documentation

## 2017-04-18 DIAGNOSIS — Z91040 Latex allergy status: Secondary | ICD-10-CM | POA: Insufficient documentation

## 2017-04-18 DIAGNOSIS — Z79899 Other long term (current) drug therapy: Secondary | ICD-10-CM | POA: Diagnosis not present

## 2017-04-18 DIAGNOSIS — B20 Human immunodeficiency virus [HIV] disease: Secondary | ICD-10-CM | POA: Diagnosis not present

## 2017-04-18 MED ORDER — DEXAMETHASONE 4 MG PO TABS
10.0000 mg | ORAL_TABLET | Freq: Once | ORAL | Status: AC
  Administered 2017-04-18: 10 mg via ORAL
  Filled 2017-04-18: qty 3

## 2017-04-18 MED ORDER — HYDROXYZINE HCL 25 MG PO TABS: 25.0000 mg | ORAL_TABLET | Freq: Four times a day (QID) | ORAL | 0 refills | Status: DC | PRN

## 2017-04-18 MED ORDER — FAMOTIDINE 20 MG PO TABS: 20.0000 mg | ORAL_TABLET | Freq: Every day | ORAL | 0 refills | Status: DC

## 2017-04-18 NOTE — ED Notes (Signed)
See provider assessment 

## 2017-04-18 NOTE — ED Triage Notes (Signed)
Pt states she developed a rash on left arm two days ago. Rash itches. Denies any new medications/lotions/detergents/foods. No SOB

## 2017-04-18 NOTE — ED Provider Notes (Signed)
[facility] DEPT Provider Note   CSN: [id number] Arrival date & time: 04/18/17  1406     History   Chief Complaint Chief Complaint  Patient presents with  . Rash    HPI [name] is a 36 y.o. female.  36yo F w/ PMH including T2DM, HIV, bipolar d/o who p/w itchy rash. 2 days ago, the patient woke up with it she rash involving her arms and trunk. The rash is diffuse and has been persistently bothering her despite taking Benadryl at home. Her last dose of Benadryl was this morning. She denies any associated vomiting, shortness of breath, or swelling. The rash is currently less noticeable than it was earlier today but she continues to have the severe itching. She denies any new exposures such as soaps, shampoos, detergents, lotions, or new foods. No outdoor exposure.   The history is provided by the patient.    Past Medical History:  Diagnosis Date  . Allergy   . Anemia   . Anxiety   . Anxiety state, unspecified 12/07/2012  . Arthritis   . Asthma   . Bipolar disorder, unspecified ([place]) 12/07/2012  . Depression   . Diabetes mellitus without complication ([place])    no meds currently- controlled with diet  . GERD (gastroesophageal reflux disease)   . GERD (gastroesophageal reflux disease) 12/07/2012  . Headache(784.0)    migraines  . Herpes genitalis in women 12/07/2012  . HIV infection ([place])   . Human immunodeficiency virus (HIV) disease ([place]) 12/07/2012  . Neuromuscular disorder ([place])    carpal tunnel in both arms  . Type II or unspecified type diabetes mellitus without mention of complication, not stated as uncontrolled 12/07/2012  . Unspecified asthma(493.90) 12/07/2012    Patient Active Problem List   Diagnosis Date Noted  . Bipolar mixed affective disorder, moderate ([place]) 03/20/2017  . Chronic tension headaches 01/23/2017  . Acute upper respiratory infection 06/21/2016  . Hyperlipidemia 11/17/2015  . Sinusitis 06/30/2015  . Hernia, abdominal 08/05/2014  .  Vaginitis and vulvovaginitis 06/19/2014  . 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 Lung abscess ([place]) 06/26/2013  . 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   05/07/2013  . Ovarian cystic mass 05/07/2013  . s/p Serosal repair of colon 12/04/2012 12/15/2012  . Herpes genitalis in women 12/07/2012  . Anxiety state, unspecified 12/07/2012  . Unspecified asthma(493.90) 12/07/2012  . GERD (gastroesophageal reflux disease) 12/07/2012  . Pelvic mass in female s/p Robotic-assisted right salpingo-oophorectomy 12/04/2012  . SHINGLES 01/31/2009  . HEMORRHOIDS NOS, W/O COMPLICATIONS 67/89/3810  . Depression 11/25/2006  . Allergic rhinitis 11/25/2006  . 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 Human immunodeficiency virus (HIV) disease ([place]) 11/10/2006    Past Surgical History:  Procedure Laterality Date  . CESAREAN SECTION    . COLON SURGERY     colon repair after salpingo  . INSERTION OF MESH N/A 11/06/2014   Procedure: INSERTION OF MESH;  Surgeon: Stark Klein, MD;  Location: [facility];  Service: General;  Laterality: N/A;  . LAPAROSCOPIC HEPATECTOMY N/A 06/28/2013   Procedure: LAPAROSCOPIC EXCISION HEPATIC MASS;  Surgeon: Stark Klein, MD;  Location: [facility];  Service: General;  Laterality: N/A;  . LAPAROSCOPIC LYSIS OF ADHESIONS N/A 12/04/2012   Procedure: LAPAROSCOPIC LYSIS OF ADHESIONS;  Surgeon: [name], MD;  Location: [facility];  Service: Gynecology;  Laterality: N/A;  . LAPAROTOMY N/A 12/04/2012   Procedure: EXPLORATORY LAPAROTOMY;  Surgeon: [name], MD;  Location: [facility];  Service: Gynecology;  Laterality:  N/A;  repair of serosal injury  . ROBOTIC ASSISTED BILATERAL SALPINGO OOPHERECTOMY Right 12/04/2012   Procedure: ROBOTIC ASSISTED Right  SALPINGO OOPHORECTOMY;  Surgeon: [name], MD;  Location: [facility];  Service: Gynecology;  Laterality: Right;  . VENTRAL HERNIA REPAIR N/A 11/06/2014   Procedure: LAPAROSCOPIC ASSITED  INCARCERATED VENTRAL HERNIA REPAIR WITH MESH;  Surgeon: Stark Klein, MD;  Location: [facility];  Service: General;  Laterality: N/A;    OB History    Gravida Para Term Preterm AB Living   3 1 1  0 2 1   SAB TAB Ectopic Multiple Live Births   1 1 0 0         Home Medications    Prior to Admission medications   Medication Sig Start Date End Date Taking? Authorizing Provider  beclomethasone (QVAR) 40 MCG/ACT inhaler INHALE TWO PUFFS INTO THE LUNGS TWO TIMES DAILY. 12/23/16   Michel Bickers, MD  bictegravir-emtricitabine-tenofovir AF (BIKTARVY) 50-200-25 MG TABS tablet Take 1 tablet by mouth daily. 03/07/17   Kuppelweiser, Cassie L, RPH  cetirizine (ZYRTEC) 10 MG tablet Take 10 mg by mouth daily.    [provider]  DENTA 5000 PLUS 1.1 % CREA dental cream USE SMALL AMOUNT ON TEETH AND GUMS EVERY EVENING SPIT OUT EXCESS AND DO NOT RINSE 03/03/17   [provider]  famotidine (PEPCID) 20 MG tablet Take 1 tablet (20 mg total) by mouth daily. 04/18/17   Indiah Heyden, Wenda Overland, MD  fexofenadine [place] ALLERGY) 180 MG tablet Take 1 tablet (180 mg total) by mouth daily. 04/28/16   Micheline Chapman, NP  GENVOYA 150-150-200-10 MG TABS tablet  02/19/17   [provider]  hydrOXYzine (ATARAX/VISTARIL) 25 MG tablet Take 1 tablet (25 mg total) by mouth every 6 (six) hours as needed for itching. 04/18/17   Melaney Tellefsen, Wenda Overland, MD  lamoTRIgine (LAMICTAL) 25 MG tablet TAKE 2 TABLETS (50 MG TOTAL) BY MOUTH AT BEDTIME. 04/13/17   Scot Jun, FNP  lubiprostone (AMITIZA) 8 MCG capsule Take 8 mcg by mouth 2 (two) times daily with a meal.    [provider]  lurasidone (LATUDA) 20 MG TABS tablet Take 1 tablet (20 mg total) by mouth daily. 02/28/17   Campbell Riches, MD  metroNIDAZOLE (FLAGYL) 500 MG tablet Take 1 tablet (500 mg total) by mouth 2 (two) times daily with a meal. DO NOT CONSUME ALCOHOL WHILE TAKING THIS MEDICATION. 03/18/17   Scot Jun, FNP  montelukast  (SINGULAIR) 10 MG tablet Take 1 tablet (10 mg total) by mouth at bedtime. Patient not taking: Reported on 03/15/2017 06/21/16   Michel Bickers, MD  Norethindrone Acetate-Ethinyl Estradiol (LOESTRIN 1.5/30, 21,) 1.5-30 MG-MCG tablet Take 1 tablet by mouth daily. 04/01/17 06/30/17  Constant, Peggy, MD  oxymetazoline (AFRIN) 0.05 % nasal spray Place 1 spray into both nostrils 2 (two) times daily. For <7 days at a time.    [provider]  PREZISTA 800 MG tablet  02/19/17   [provider]  PROAIR HFA 108 (90 Base) MCG/ACT inhaler INHALE TWO PUFFS INTO THE LUNGS EVERY 6 HOURS AS NEEDED FOR WHEEZING. 08/30/16   Micheline Chapman, NP  rizatriptan (MAXALT-MLT) 5 MG disintegrating tablet Take 2 tablets (10 mg total) by mouth as needed for migraine. May repeat in 2 hours if needed 01/20/17   Scot Jun, FNP  tiZANidine (ZANAFLEX) 4 MG capsule Take 1 capsule (4 mg total) by mouth 3 (three) times daily as needed for muscle spasms. 01/20/17  Scot Jun, FNP  Topiramate ER 100 MG CS24 Take 100 mg by mouth at bedtime. 01/20/17   Scot Jun, FNP  triamcinolone cream (KENALOG) 0.5 % APPLY 1 APPLICATION TOPICALLY 3 (THREE) TIMES DAILY. 01/11/17   Scot Jun, FNP  valACYclovir (VALTREX) 500 MG tablet TAKE 1 TABLET (500 MG TOTAL) BY MOUTH DAILY. 03/29/16   Campbell Riches, MD    Family History Family History  Problem Relation Age of Onset  . Arthritis/Rheumatoid Mother   . Hypertension Mother   . Cancer Maternal Aunt        brand and lung  . Cancer Maternal Grandmother        breasts  . Cancer Paternal Grandmother        breast and kidney    Social History Social History  Substance Use Topics  . Smoking status: Never Smoker  . Smokeless tobacco: Never Used  . Alcohol use 0.0 oz/week     Comment: OCC.     Allergies   Benzoin; Latex; and Sulfamethoxazole-trimethoprim   Review of Systems Review of Systems All other systems reviewed and are negative except  that which was mentioned in HPI   Physical Exam Updated Vital Signs BP 121/88 (BP Location: Left Arm)   Pulse (!) 112   Temp 98.8 F (37.1 C) (Oral)   Resp 15   Ht 5\' 4"  (1.626 m)   Wt 85.7 kg (189 lb)   SpO2 98%   BMI 32.44 kg/m   Physical Exam  Constitutional: She is oriented to person, place, and time. She appears well-developed and well-nourished. No distress.  HENT:  Head: Normocephalic and atraumatic.  Mouth/Throat: Oropharynx is clear and moist.  Eyes: Conjunctivae are normal.  Neck: Neck supple.  Cardiovascular: Normal rate, regular rhythm and normal heart sounds.   No murmur heard. Pulmonary/Chest: Effort normal and breath sounds normal. No respiratory distress.  Neurological: She is alert and oriented to person, place, and time.  Skin: Skin is warm and dry.  Occasional small macules on anterior central chest, a few scattered on arms, no mucous membrane involvement  Psychiatric:  Tearful, anxious and scratching furiously during conversation  Nursing note and vitals reviewed.    ED Treatments / Results  Labs (all labs ordered are listed, but only abnormal results are displayed) Labs Reviewed - No data to display  EKG  EKG Interpretation None       Radiology No results found.  Procedures Procedures (including critical care time)  Medications Ordered in ED Medications  dexamethasone (DECADRON) tablet 10 mg (not administered)     Initial Impression / Assessment and Plan / ED Course  I have reviewed the triage vital signs and the nursing notes.      Pt w/ 2 days persistent diffuse itchy rash, no other associated sx. She was Breathing comfortably on room air with no respiratory complaints. She has had no signs or symptoms to suggest anaphylaxis. Her rash is almost not noticeable on exam but she is itching quite vigorously on exam; No obvious cause of her symptoms but her symptoms do suggest allergic reaction. Gave dose of Decadron and provided with  Atarax because she states Benadryl has not been helping. Also provided with Pepcid. Discussed supportive measures. Patient voiced understanding.  Final Clinical Impressions(s) / ED Diagnoses   Final diagnoses:  Rash and nonspecific skin eruption    New Prescriptions New Prescriptions   FAMOTIDINE (PEPCID) 20 MG TABLET    Take 1 tablet (20 mg total) by  mouth daily.   HYDROXYZINE (ATARAX/VISTARIL) 25 MG TABLET    Take 1 tablet (25 mg total) by mouth every 6 (six) hours as needed for itching.     Nikko Quast, Wenda Overland, MD 04/18/17 [id number]

## 2017-04-19 ENCOUNTER — Other Ambulatory Visit: Payer: BC Managed Care – PPO

## 2017-04-19 ENCOUNTER — Ambulatory Visit (INDEPENDENT_AMBULATORY_CARE_PROVIDER_SITE_OTHER): Payer: BC Managed Care – PPO | Admitting: Family Medicine

## 2017-04-19 ENCOUNTER — Encounter: Payer: Self-pay | Admitting: Family Medicine

## 2017-04-19 VITALS — BP 140/80 | HR 88 | Temp 97.9°F | Resp 16 | Ht 64.0 in | Wt 189.6 lb

## 2017-04-19 DIAGNOSIS — B20 Human immunodeficiency virus [HIV] disease: Secondary | ICD-10-CM | POA: Diagnosis not present

## 2017-04-19 DIAGNOSIS — L309 Dermatitis, unspecified: Secondary | ICD-10-CM | POA: Diagnosis not present

## 2017-04-19 MED ORDER — TRIAMCINOLONE ACETONIDE 0.1 % EX CREA: 1.0000 "application " | TOPICAL_CREAM | Freq: Two times a day (BID) | CUTANEOUS | 0 refills | Status: DC

## 2017-04-19 MED ORDER — CETIRIZINE HCL 10 MG PO TABS: 20.0000 mg | ORAL_TABLET | Freq: Every day | ORAL | 0 refills | Status: DC

## 2017-04-19 MED ORDER — RANITIDINE HCL 150 MG PO TABS: 150.0000 mg | ORAL_TABLET | Freq: Two times a day (BID) | ORAL | 0 refills | Status: DC

## 2017-04-19 MED ORDER — PREDNISONE 20 MG PO TABS: 60.0000 mg | ORAL_TABLET | Freq: Every day | ORAL | 0 refills | Status: DC

## 2017-04-19 NOTE — Progress Notes (Signed)
Patient ID: [name], female    DOB: Jul 21, 1981, 36 y.o.   MRN: [id number]  PCP: Scot Jun, FNP  Chief Complaint  Patient presents with  . Follow-up    ITCHING, Paper rx    Subjective:  HPI [name] is a 35 y.o. female presents for evaluation of a generalized rash for which she was recently evaluated at [facility] Emergency department. [name] who is HIV positive reports generalized itchy rash worsening to her bilateral arms ,back, legs, and stomach for a total period of 3 days. Denies any recent changes in detergents, deodorant, or cosmetics.  She has attempted relief with hydroxyzine and famotidine without relief of symptoms.   Social History   Social History  . Marital status: Single    Spouse name: N/A  . Number of children: 1  . Years of education: 32   Occupational History  . Not on file.   Social History Main Topics  . Smoking status: Never Smoker  . Smokeless tobacco: Never Used  . Alcohol use 0.0 oz/week     Comment: OCC.  . Drug use: No  . Sexual activity: Yes    Partners: Male    Birth control/ protection: Implant   Other Topics Concern  . Not on file   Social History Narrative   Patient lives alone.   Patient works at home.   Patient has hs education.   Patient has 1 child.   Patient drinks caffeine once in a while.    Family History  Problem Relation Age of Onset  . Arthritis/Rheumatoid Mother   . Hypertension Mother   . Cancer Maternal Aunt        brand and lung  . Cancer Maternal Grandmother        breasts  . Cancer Paternal Grandmother        breast and kidney   Review of Systems See HPI  Patient Active Problem List   Diagnosis Date Noted  . Bipolar mixed affective disorder, moderate ([place]) 03/20/2017  . Chronic tension headaches 01/23/2017  . Acute upper respiratory infection 06/21/2016  . Hyperlipidemia 11/17/2015  . Sinusitis 06/30/2015  . Hernia, abdominal 08/05/2014  . Vaginitis and vulvovaginitis  06/19/2014  . 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 Lung abscess ([place]) 06/26/2013  . 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   05/07/2013  . Ovarian cystic mass 05/07/2013  . s/p Serosal repair of colon 12/04/2012 12/15/2012  . Herpes genitalis in women 12/07/2012  . Anxiety state, unspecified 12/07/2012  . Unspecified asthma(493.90) 12/07/2012  . GERD (gastroesophageal reflux disease) 12/07/2012  . Pelvic mass in female s/p Robotic-assisted right salpingo-oophorectomy 12/04/2012  . SHINGLES 01/31/2009  . HEMORRHOIDS NOS, W/O COMPLICATIONS 37/85/8850  . Depression 11/25/2006  . Allergic rhinitis 11/25/2006  . 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 Human immunodeficiency virus (HIV) disease ([place]) 11/10/2006    Allergies  Allergen Reactions  . Benzoin Other (See Comments)    blisters  . Latex Rash and Other (See Comments)  . Sulfamethoxazole-Trimethoprim Itching and Nausea And Vomiting    Prior to Admission medications   Medication Sig Start Date End Date Taking? Authorizing Provider  beclomethasone (QVAR) 40 MCG/ACT inhaler INHALE TWO PUFFS INTO THE LUNGS TWO TIMES DAILY. 12/23/16  Yes Michel Bickers, MD  bictegravir-emtricitabine-tenofovir AF (BIKTARVY) 50-200-25 MG TABS tablet Take 1 tablet by mouth daily. 03/07/17  Yes Kuppelweiser, Cassie L, RPH  cetirizine (ZYRTEC) 10 MG tablet Take 10 mg by mouth daily.  Yes [provider]  DENTA 5000 PLUS 1.1 % CREA dental cream USE SMALL AMOUNT ON TEETH AND GUMS EVERY EVENING SPIT OUT EXCESS AND DO NOT RINSE 03/03/17  Yes [provider]  famotidine (PEPCID) 20 MG tablet Take 1 tablet (20 mg total) by mouth daily. 04/18/17  Yes Little, Wenda Overland, MD  fexofenadine [facility] ALLERGY) 180 MG tablet Take 1 tablet (180 mg total) by mouth daily. 04/28/16  Yes Micheline Chapman, NP  GENVOYA 150-150-200-10 MG TABS tablet  02/19/17  Yes [provider]  hydrOXYzine  (ATARAX/VISTARIL) 25 MG tablet Take 1 tablet (25 mg total) by mouth every 6 (six) hours as needed for itching. 04/18/17  Yes Little, Wenda Overland, MD  lamoTRIgine (LAMICTAL) 25 MG tablet TAKE 2 TABLETS (50 MG TOTAL) BY MOUTH AT BEDTIME. 04/13/17  Yes Scot Jun, FNP  lubiprostone (AMITIZA) 8 MCG capsule Take 8 mcg by mouth 2 (two) times daily with a meal.   Yes [provider]  lurasidone (LATUDA) 20 MG TABS tablet Take 1 tablet (20 mg total) by mouth daily. 02/28/17  Yes Campbell Riches, MD  Norethindrone Acetate-Ethinyl Estradiol (LOESTRIN 1.5/30, 21,) 1.5-30 MG-MCG tablet Take 1 tablet by mouth daily. 04/01/17 06/30/17 Yes Constant, Peggy, MD  oxymetazoline (AFRIN) 0.05 % nasal spray Place 1 spray into both nostrils 2 (two) times daily. For <7 days at a time.   Yes [provider]  PREZISTA 800 MG tablet  02/19/17  Yes [provider]  PROAIR HFA 108 (90 Base) MCG/ACT inhaler INHALE TWO PUFFS INTO THE LUNGS EVERY 6 HOURS AS NEEDED FOR WHEEZING. 08/30/16  Yes Micheline Chapman, NP  rizatriptan (MAXALT-MLT) 5 MG disintegrating tablet Take 2 tablets (10 mg total) by mouth as needed for migraine. May repeat in 2 hours if needed 01/20/17  Yes Scot Jun, FNP  tiZANidine (ZANAFLEX) 4 MG capsule Take 1 capsule (4 mg total) by mouth 3 (three) times daily as needed for muscle spasms. 01/20/17  Yes Scot Jun, FNP  Topiramate ER 100 MG CS24 Take 100 mg by mouth at bedtime. 01/20/17  Yes Scot Jun, FNP  triamcinolone cream (KENALOG) 0.5 % APPLY 1 APPLICATION TOPICALLY 3 (THREE) TIMES DAILY. 01/11/17  Yes Scot Jun, FNP  valACYclovir (VALTREX) 500 MG tablet TAKE 1 TABLET (500 MG TOTAL) BY MOUTH DAILY. 03/29/16  Yes Campbell Riches, MD  montelukast (SINGULAIR) 10 MG tablet Take 1 tablet (10 mg total) by mouth at bedtime. Patient not taking: Reported on 04/19/2017 06/21/16   Michel Bickers, MD    Past Medical, Surgical Family and Social History reviewed  and updated.    Objective:   Today's Vitals   04/19/17 1343  BP: 140/80  Pulse: 88  Resp: 16  Temp: 97.9 F (36.6 C)  TempSrc: Oral  SpO2: 100%  Weight: 189 lb 9.6 oz (86 kg)  Height: 5\' 4"  (1.626 m)    Wt Readings from Last 3 Encounters:  04/19/17 189 lb 9.6 oz (86 kg)  04/18/17 189 lb (85.7 kg)  03/15/17 196 lb (88.9 kg)   Physical Exam  Constitutional: She is oriented to person, place, and time. She appears well-developed and well-nourished.  HENT:  Head: Normocephalic and atraumatic.  Eyes: Pupils are equal, round, and reactive to light. Conjunctivae and EOM are normal.  Neck: Normal range of motion. Neck supple.  Cardiovascular: Normal rate, regular rhythm, normal heart sounds and intact distal pulses.   Pulmonary/Chest: Effort normal and breath sounds normal.  Musculoskeletal: Normal range of motion.  Neurological: She is alert and oriented to person, place, and time.  Skin: Skin is warm, dry and intact. Rash noted. Rash is macular. There is erythema.  Psychiatric: She has a normal mood and affect. Her behavior is normal. Judgment and thought content normal.   Assessment & Plan:  1. Dermatitis, hx of eczema. Will treat with a short course of high dose prednisone x 7 days. For itching, continue hydroxyzine and pepcid as previously prescribed.   Meds ordered this encounter  Medications  . predniSONE (DELTASONE) 20 MG tablet    Sig: Take 3 tablets (60 mg total) by mouth daily with breakfast.    Dispense:  21 tablet    Refill:  0    Order Specific Question:   Supervising Provider    Answer:   Tresa Garter [id number]  . triamcinolone cream (KENALOG) 0.1 %    Sig: Apply 1 application topically 2 (two) times daily.    Dispense:  454 g    Refill:  0    Order Specific Question:   Supervising Provider    Answer:   Tresa Garter [id number]  . ranitidine (ZANTAC) 150 MG tablet    Sig: Take 1 tablet (150 mg total) by mouth 2 (two) times daily.    Dispense:   60 tablet    Refill:  0    Order Specific Question:   Supervising Provider    Answer:   Tresa Garter [id number]  . cetirizine (ZYRTEC) 10 MG tablet    Sig: Take 2 tablets (20 mg total) by mouth at bedtime.    Dispense:  60 tablet    Refill:  0    Order Specific Question:   Supervising Provider    Answer:   Tresa Garter [[id number]]    RTC: Scheduled follow-up on file    Carroll Sage. Kenton Kingfisher, MSN, FNP-C [facility]  [address] [place], [place] [address] [phone]

## 2017-04-19 NOTE — Patient Instructions (Signed)
Follow-up if symptoms do not improve!   Eczema Eczema, also called atopic dermatitis, is a skin disorder that causes inflammation of the skin. It causes a red rash and dry, scaly skin. The skin becomes very itchy. Eczema is generally worse during the cooler winter months and often improves with the warmth of summer. Eczema usually starts showing signs in infancy. Some children outgrow eczema, but it may last through adulthood. What are the causes? The exact cause of eczema is not known, but it appears to run in families. People with eczema often have a family history of eczema, allergies, asthma, or hay fever. Eczema is not contagious. Flare-ups of the condition may be caused by:  Contact with something you are sensitive or allergic to.  Stress.  What are the signs or symptoms?  Dry, scaly skin.  Red, itchy rash.  Itchiness. This may occur before the skin rash and may be very intense. How is this diagnosed? The diagnosis of eczema is usually made based on symptoms and medical history. How is this treated? Eczema cannot be cured, but symptoms usually can be controlled with treatment and other strategies. A treatment plan might include:  Controlling the itching and scratching. ? Use over-the-counter antihistamines as directed for itching. This is especially useful at night when the itching tends to be worse. ? Use over-the-counter steroid creams as directed for itching. ? Avoid scratching. Scratching makes the rash and itching worse. It may also result in a skin infection (impetigo) due to a break in the skin caused by scratching.  Keeping the skin well moisturized with creams every day. This will seal in moisture and help prevent dryness. Lotions that contain alcohol and water should be avoided because they can dry the skin.  Limiting exposure to things that you are sensitive or allergic to (allergens).  Recognizing situations that cause stress.  Developing a plan to manage  stress.  Follow these instructions at home:  Only take over-the-counter or prescription medicines as directed by your health care provider.  Do not use anything on the skin without checking with your health care provider.  Keep baths or showers short (5 minutes) in warm (not hot) water. Use mild cleansers for bathing. These should be unscented. You may add nonperfumed bath oil to the bath water. It is best to avoid soap and bubble bath.  Immediately after a bath or shower, when the skin is still damp, apply a moisturizing ointment to the entire body. This ointment should be a petroleum ointment. This will seal in moisture and help prevent dryness. The thicker the ointment, the better. These should be unscented.  Keep fingernails cut short. Children with eczema may need to wear soft gloves or mittens at night after applying an ointment.  Dress in clothes made of cotton or cotton blends. Dress lightly, because heat increases itching.  A child with eczema should stay away from anyone with fever blisters or cold sores. The virus that causes fever blisters (herpes simplex) can cause a serious skin infection in children with eczema. Contact a health care provider if:  Your itching interferes with sleep.  Your rash gets worse or is not better within 1 week after starting treatment.  You see pus or soft yellow scabs in the rash area.  You have a fever.  You have a rash flare-up after contact with someone who has fever blisters. This information is not intended to replace advice given to you by your health care provider. Make sure you discuss any  questions you have with your health care provider. Document Released: 07/30/2000 Document Revised: 01/08/2016 Document Reviewed: 03/05/2013 Elsevier Interactive Patient Education  2017 [employer].

## 2017-04-20 LAB — T-HELPER CELL (CD4) - (RCID CLINIC ONLY)
CD4 T CELL HELPER: 31 % — AB (ref 33–55)
CD4 T Cell Abs: 1130 /uL (ref 400–2700)

## 2017-04-21 LAB — HIV-1 RNA QUANT-NO REFLEX-BLD
HIV 1 RNA Quant: 21 copies/mL — ABNORMAL HIGH
HIV-1 RNA Quant, Log: 1.32 Log copies/mL — ABNORMAL HIGH

## 2017-04-27 ENCOUNTER — Other Ambulatory Visit: Payer: Self-pay | Admitting: Pharmacist

## 2017-04-27 ENCOUNTER — Other Ambulatory Visit: Payer: Self-pay | Admitting: Infectious Diseases

## 2017-05-02 ENCOUNTER — Other Ambulatory Visit: Payer: Self-pay | Admitting: Infectious Diseases

## 2017-05-11 ENCOUNTER — Other Ambulatory Visit: Payer: Self-pay

## 2017-05-11 MED ORDER — LAMOTRIGINE 25 MG PO TABS: 50.0000 mg | ORAL_TABLET | Freq: Every day | ORAL | 0 refills | Status: DC

## 2017-05-12 ENCOUNTER — Ambulatory Visit (INDEPENDENT_AMBULATORY_CARE_PROVIDER_SITE_OTHER): Payer: BC Managed Care – PPO | Admitting: Psychiatry

## 2017-05-12 ENCOUNTER — Encounter (HOSPITAL_COMMUNITY): Payer: Self-pay | Admitting: Psychiatry

## 2017-05-12 VITALS — BP 126/82 | HR 80 | Ht 64.0 in | Wt 194.0 lb

## 2017-05-12 DIAGNOSIS — G4700 Insomnia, unspecified: Secondary | ICD-10-CM

## 2017-05-12 DIAGNOSIS — F321 Major depressive disorder, single episode, moderate: Secondary | ICD-10-CM

## 2017-05-12 DIAGNOSIS — Z813 Family history of other psychoactive substance abuse and dependence: Secondary | ICD-10-CM | POA: Diagnosis not present

## 2017-05-12 DIAGNOSIS — R450 Nervousness: Secondary | ICD-10-CM

## 2017-05-12 DIAGNOSIS — Z91410 Personal history of adult physical and sexual abuse: Secondary | ICD-10-CM

## 2017-05-12 DIAGNOSIS — F4312 Post-traumatic stress disorder, chronic: Secondary | ICD-10-CM | POA: Diagnosis not present

## 2017-05-12 DIAGNOSIS — R4586 Emotional lability: Secondary | ICD-10-CM | POA: Diagnosis not present

## 2017-05-12 DIAGNOSIS — Z62810 Personal history of physical and sexual abuse in childhood: Secondary | ICD-10-CM

## 2017-05-12 DIAGNOSIS — F419 Anxiety disorder, unspecified: Secondary | ICD-10-CM

## 2017-05-12 DIAGNOSIS — F0630 Mood disorder due to known physiological condition, unspecified: Secondary | ICD-10-CM | POA: Diagnosis not present

## 2017-05-12 MED ORDER — LURASIDONE HCL 40 MG PO TABS: 40.0000 mg | ORAL_TABLET | Freq: Every day | ORAL | 1 refills | Status: DC
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
## 2017-05-24 ENCOUNTER — Other Ambulatory Visit: Payer: Self-pay

## 2017-05-26 ENCOUNTER — Encounter: Payer: Self-pay | Admitting: Family Medicine

## 2017-05-26 ENCOUNTER — Other Ambulatory Visit (HOSPITAL_COMMUNITY)
Admission: RE | Admit: 2017-05-26 | Discharge: 2017-05-26 | Disposition: A | Discharge status: Home / Self Care | Payer: BC Managed Care – PPO | Source: Ambulatory Visit | Attending: Family Medicine | Admitting: Family Medicine

## 2017-05-26 ENCOUNTER — Ambulatory Visit (INDEPENDENT_AMBULATORY_CARE_PROVIDER_SITE_OTHER): Payer: BC Managed Care – PPO | Admitting: Family Medicine

## 2017-05-26 VITALS — BP 121/85 | HR 72 | Temp 98.7°F | Resp 14 | Ht 64.0 in | Wt 193.0 lb

## 2017-05-26 DIAGNOSIS — B373 Candidiasis of vulva and vagina: Secondary | ICD-10-CM | POA: Diagnosis not present

## 2017-05-26 DIAGNOSIS — N898 Other specified noninflammatory disorders of vagina: Secondary | ICD-10-CM

## 2017-05-26 DIAGNOSIS — Z8619 Personal history of other infectious and parasitic diseases: Secondary | ICD-10-CM | POA: Diagnosis not present

## 2017-05-26 DIAGNOSIS — J019 Acute sinusitis, unspecified: Secondary | ICD-10-CM | POA: Diagnosis not present

## 2017-05-26 DIAGNOSIS — J309 Allergic rhinitis, unspecified: Secondary | ICD-10-CM | POA: Diagnosis not present

## 2017-05-26 DIAGNOSIS — Z09 Encounter for follow-up examination after completed treatment for conditions other than malignant neoplasm: Secondary | ICD-10-CM | POA: Insufficient documentation

## 2017-05-26 DIAGNOSIS — B3731 Acute candidiasis of vulva and vagina: Secondary | ICD-10-CM

## 2017-05-26 LAB — POCT URINALYSIS DIP (DEVICE)
Bilirubin Urine: NEGATIVE
GLUCOSE, UA: NEGATIVE mg/dL
Ketones, ur: NEGATIVE mg/dL
Leukocytes, UA: NEGATIVE
NITRITE: NEGATIVE
PROTEIN: NEGATIVE mg/dL
Specific Gravity, Urine: 1.02 (ref 1.005–1.030)
UROBILINOGEN UA: 1 mg/dL (ref 0.0–1.0)
pH: 7 (ref 5.0–8.0)

## 2017-05-26 MED ORDER — FLUCONAZOLE 150 MG PO TABS: 150.0000 mg | ORAL_TABLET | Freq: Once | ORAL | 0 refills | Status: AC

## 2017-05-26 MED ORDER — AMOXICILLIN-POT CLAVULANATE 875-125 MG PO TABS: 1.0000 | ORAL_TABLET | Freq: Two times a day (BID) | ORAL | 0 refills | Status: DC

## 2017-05-26 NOTE — Patient Instructions (Addendum)
Augmentin 800-125 mg twice daily for 10 days.  Increase rest, handwashing, and fluid intake.  Vaginal yeast: Difflucan 150 mg one tablet, repeat in 1 week.       Sinusitis, Adult Sinusitis is soreness and inflammation of your sinuses. Sinuses are hollow spaces in the bones around your face. Your sinuses are located:  Around your eyes.  In the middle of your forehead.  Behind your nose.  In your cheekbones.  Your sinuses and nasal passages are lined with a stringy fluid (mucus). Mucus normally drains out of your sinuses. When your nasal tissues become inflamed or swollen, the mucus can become trapped or blocked so air cannot flow through your sinuses. This allows bacteria, viruses, and funguses to grow, which leads to infection. Sinusitis can develop quickly and last for 7?10 days (acute) or for more than 12 weeks (chronic). Sinusitis often develops after a cold. What are the causes? This condition is caused by anything that creates swelling in the sinuses or stops mucus from draining, including:  Allergies.  Asthma.  Bacterial or viral infection.  Abnormally shaped bones between the nasal passages.  Nasal growths that contain mucus (nasal polyps).  Narrow sinus openings.  Pollutants, such as chemicals or irritants in the air.  A foreign object stuck in the nose.  A fungal infection. This is rare.  What increases the risk? The following factors may make you more likely to develop this condition:  Having allergies or asthma.  Having had a recent cold or respiratory tract infection.  Having structural deformities or blockages in your nose or sinuses.  Having a weak immune system.  Doing a lot of swimming or diving.  Overusing nasal sprays.  Smoking.  What are the signs or symptoms? The main symptoms of this condition are pain and a feeling of pressure around the affected sinuses. Other symptoms include:  Upper toothache.  Earache.  Headache.  Bad  breath.  Decreased sense of smell and taste.  A cough that may get worse at night.  Fatigue.  Fever.  Thick drainage from your nose. The drainage is often green and it may contain pus (purulent).  Stuffy nose or congestion.  Postnasal drip. This is when extra mucus collects in the throat or back of the nose.  Swelling and warmth over the affected sinuses.  Sore throat.  Sensitivity to light.  How is this diagnosed? This condition is diagnosed based on symptoms, a medical history, and a physical exam. To find out if your condition is acute or chronic, your health care provider may:  Look in your nose for signs of nasal polyps.  Tap over the affected sinus to check for signs of infection.  View the inside of your sinuses using an imaging device that has a light attached (endoscope).  If your health care provider suspects that you have chronic sinusitis, you may also:  Be tested for allergies.  Have a sample of mucus taken from your nose (nasal culture) and checked for bacteria.  Have a mucus sample examined to see if your sinusitis is related to an allergy.  If your sinusitis does not respond to treatment and it lasts longer than 8 weeks, you may have an MRI or CT scan to check your sinuses. These scans also help to determine how severe your infection is. In rare cases, a bone biopsy may be done to rule out more serious types of fungal sinus disease. How is this treated? Treatment for sinusitis depends on the cause and whether  your condition is chronic or acute. If a virus is causing your sinusitis, your symptoms will go away on their own within 10 days. You may be given medicines to relieve your symptoms, including:  Topical nasal decongestants. They shrink swollen nasal passages and let mucus drain from your sinuses.  Antihistamines. These drugs block inflammation that is triggered by allergies. This can help to ease swelling in your nose and sinuses.  Topical nasal  corticosteroids. These are nasal sprays that ease inflammation and swelling in your nose and sinuses.  Nasal saline washes. These rinses can help to get rid of thick mucus in your nose.  If your condition is caused by bacteria, you will be given an antibiotic medicine. If your condition is caused by a fungus, you will be given an antifungal medicine. Surgery may be needed to correct underlying conditions, such as narrow nasal passages. Surgery may also be needed to remove polyps. Follow these instructions at home: Medicines  Take, use, or apply over-the-counter and prescription medicines only as told by your health care provider. These may include nasal sprays.  If you were prescribed an antibiotic medicine, take it as told by your health care provider. Do not stop taking the antibiotic even if you start to feel better. Hydrate and Humidify  Drink enough water to keep your urine clear or pale yellow. Staying hydrated will help to thin your mucus.  Use a cool mist humidifier to keep the humidity level in your home above 50%.  Inhale steam for 10-15 minutes, 3-4 times a day or as told by your health care provider. You can do this in the bathroom while a hot shower is running.  Limit your exposure to cool or dry air. Rest  Rest as much as possible.  Sleep with your head raised (elevated).  Make sure to get enough sleep each night. General instructions  Apply a warm, moist washcloth to your face 3-4 times a day or as told by your health care provider. This will help with discomfort.  Wash your hands often with soap and water to reduce your exposure to viruses and other germs. If soap and water are not available, use hand sanitizer.  Do not smoke. Avoid being around people who are smoking (secondhand smoke).  Keep all follow-up visits as told by your health care provider. This is important. Contact a health care provider if:  You have a fever.  Your symptoms get worse.  Your  symptoms do not improve within 10 days. Get help right away if:  You have a severe headache.  You have persistent vomiting.  You have pain or swelling around your face or eyes.  You have vision problems.  You develop confusion.  Your neck is stiff.  You have trouble breathing. This information is not intended to replace advice given to you by your health care provider. Make sure you discuss any questions you have with your health care provider. Document Released: 08/02/2005 Document Revised: 03/28/2016 Document Reviewed: 05/28/2015 Elsevier Interactive Patient Education  2017 [employer].

## 2017-05-26 NOTE — Progress Notes (Signed)
Subjective:     [name] is a 36 y.o. female with a history of HIV and asthma that presents complaining of sinus pressure, sinus pain and worsening nasal congestion over the past 10 days. Patient has been taking OTC Sudafed and nasal spray with minimal relief.  Her symptoms include ear itching and post nasal drip.   She takes Cetirizine 10 mg daily for allergic rhinitis.  [name] is also complaining of vaginal itching and discharge over the past 3 days. Discharge is described as thick and white.  She has not been sexually active in greater than 1 year. Patient has not attempted any OTC interventions to alleviate current symptoms.  Past Medical History:  Diagnosis Date  . Allergy   . Anemia   . Anxiety   . Anxiety state, unspecified 12/07/2012  . Arthritis   . Asthma   . Bipolar disorder, unspecified ([place]) 12/07/2012  . Depression   . Diabetes mellitus without complication ([place])    no meds currently- controlled with diet  . GERD (gastroesophageal reflux disease)   . GERD (gastroesophageal reflux disease) 12/07/2012  . Headache(784.0)    migraines  . Herpes genitalis in women 12/07/2012  . HIV infection ([place])   . Human immunodeficiency virus (HIV) disease ([place]) 12/07/2012  . Neuromuscular disorder ([place])    carpal tunnel in both arms  . Type II or unspecified type diabetes mellitus without mention of complication, not stated as uncontrolled 12/07/2012  . Unspecified asthma(493.90) 12/07/2012   Social History   Social History  . Marital status: Single    Spouse name: N/A  . Number of children: 1  . Years of education: 59   Occupational History  . Not on file.   Social History Main Topics  . Smoking status: Never Smoker  . Smokeless tobacco: Never Used  . Alcohol use No     Comment: OCC.  . Drug use: No  . Sexual activity: No   Other Topics Concern  . Not on file   Social History Narrative   Patient lives alone.   Patient works at home.   Patient has hs education.   Patient has 1 child.   Patient drinks caffeine once in a while.    Immunization History  Administered Date(s) Administered  . Hepatitis B 03/20/2008, 09/25/2008, 06/03/2009  . Hepatitis B, adult 06/19/2014, 08/05/2014, 01/15/2015  . Influenza Split 08/02/2012  . Influenza Whole 06/13/2008, 06/03/2009, 06/01/2010  . Influenza,inj,Quad PF,6+ Mos 05/03/2013, 06/12/2014, 04/26/2016  . Meningococcal Mcv4o 02/07/2017  . PPD Test 05/31/2014  . Pneumococcal Polysaccharide-23 03/20/2008, 05/03/2013  . Tdap 08/02/2012    Review of Systems  Constitutional: Positive for malaise/fatigue. Negative for chills and fever.  HENT: Positive for congestion and sinus pain. Negative for ear discharge and ear pain.   Respiratory: Negative for cough and shortness of breath.   Cardiovascular: Negative.  Negative for chest pain, orthopnea, claudication and leg swelling.  Gastrointestinal: Negative.   Genitourinary: Negative.   Musculoskeletal: Negative.   Neurological: Negative.   Psychiatric/Behavioral: Negative.    Objective:    BP 121/85 (BP Location: Right Arm, Patient Position: Sitting, Cuff Size: Large)   Pulse 72   Temp 98.7 F (37.1 C) (Oral)   Resp 14   Ht 5\' 4"  (1.626 m)   Wt 193 lb (87.5 kg)   LMP 05/24/2017   SpO2 100%   BMI 33.13 kg/m  Physical Exam  HENT:  Right Ear: Tympanic membrane is erythematous.  Left Ear: Tympanic membrane is erythematous.  Nose:  Mucosal edema and rhinorrhea present. Right sinus exhibits maxillary sinus tenderness. Left sinus exhibits maxillary sinus tenderness.  Cardiovascular: Normal rate, regular rhythm, normal heart sounds and intact distal pulses.   Pulmonary/Chest: Effort normal and breath sounds normal.  Abdominal: Soft. Bowel sounds are normal.  Skin: Skin is warm and dry.  Psychiatric: Mood, memory, affect and judgment normal.   Assessment:  Acute Sinusitis   Plan:  1. Acute sinusitis, recurrence not specified, unspecified location Increase  rest, handwashing, and fluid intake.  OTC nasal saline twice daily as needed - amoxicillin-clavulanate (AUGMENTIN) 875-125 MG tablet; Take 1 tablet by mouth 2 (two) times daily.  Dispense: 20 tablet; Refill: 0  2. Allergic rhinitis, unspecified seasonality, unspecified trigger Continue Cetirizine 10 mg at bedtime.   3. Vaginal yeast infection - fluconazole (DIFLUCAN) 150 MG tablet; Take 1 tablet (150 mg total) by mouth once.  Dispense: 2 tablet; Refill: 0 - Cervicovaginal ancillary only - POCT urinalysis dip (device)  4. Vaginal itching - Cervicovaginal ancillary only - POCT urinalysis dip (device)   RTC: Follow up with Molli Barrows, FNP as previously scheduled   Donia Pounds  MSN, FNP-C [facility] [address] [place], [place] [address] [phone]   The patient was given clear instructions to go to ER or return to medical center if symptoms do not improve, worsen or new problems develop. The patient verbalized understanding.

## 2017-05-27 LAB — CERVICOVAGINAL ANCILLARY ONLY
BACTERIAL VAGINITIS: NEGATIVE
CANDIDA VAGINITIS: NEGATIVE
Trichomonas: NEGATIVE

## 2017-05-30 ENCOUNTER — Encounter: Payer: Self-pay | Admitting: Family Medicine

## 2017-05-30 ENCOUNTER — Ambulatory Visit (HOSPITAL_COMMUNITY)
Admission: RE | Admit: 2017-05-30 | Discharge: 2017-05-30 | Disposition: A | Discharge status: Home / Self Care | Payer: BC Managed Care – PPO | Source: Ambulatory Visit | Attending: Family Medicine | Admitting: Family Medicine

## 2017-05-30 ENCOUNTER — Emergency Department (HOSPITAL_COMMUNITY)
Admission: EM | Admit: 2017-05-30 | Discharge: 2017-05-30 | Discharge status: Against Medical Advice | Payer: BC Managed Care – PPO | Source: Home / Self Care

## 2017-05-30 ENCOUNTER — Ambulatory Visit (INDEPENDENT_AMBULATORY_CARE_PROVIDER_SITE_OTHER): Payer: BC Managed Care – PPO | Admitting: Family Medicine

## 2017-05-30 VITALS — BP 105/66 | HR 82 | Temp 98.2°F | Resp 14 | Ht 64.0 in | Wt 195.0 lb

## 2017-05-30 DIAGNOSIS — M545 Low back pain: Secondary | ICD-10-CM | POA: Diagnosis not present

## 2017-05-30 DIAGNOSIS — M5416 Radiculopathy, lumbar region: Secondary | ICD-10-CM

## 2017-05-30 MED ORDER — PREDNISONE 20 MG PO TABS: ORAL_TABLET | ORAL | 0 refills | Status: DC

## 2017-05-30 NOTE — Progress Notes (Signed)
Patient ID: [name], female    DOB: [birth year], 36 y.o.   MRN: [id number]  PCP: Scot Jun, FNP  Chief Complaint  Patient presents with  . Back Pain    Subjective:  HPI [name] is a 36 y.o. female presents for evaluation of acute back pain. She reports bending down a few days prior and feeling that something popped in her lower back. Complains of pain radiating pain down her right leg and shooting up her back. She has not attempted relief with any medication or ice applications.  Social History   Social History  . Marital status: Single    Spouse name: N/A  . Number of children: 1  . Years of education: 16   Occupational History  . Not on file.   Social History Main Topics  . Smoking status: Never Smoker  . Smokeless tobacco: Never Used  . Alcohol use No     Comment: OCC.  . Drug use: No  . Sexual activity: No   Other Topics Concern  . Not on file   Social History Narrative   Patient lives alone.   Patient works at home.   Patient has hs education.   Patient has 1 child.   Patient drinks caffeine once in a while.    Family History  Problem Relation Age of Onset  . Arthritis/Rheumatoid Mother   . Hypertension Mother   . Cancer Maternal Aunt        brand and lung  . Cancer Maternal Grandmother        breasts  . Cancer Paternal Grandmother        breast and kidney   Review of Systems See HPI  Patient Active Problem List   Diagnosis Date Noted  . Bipolar mixed affective disorder, moderate ([place]) 03/20/2017  . Chronic tension headaches 01/23/2017  . Acute upper respiratory infection 06/21/2016  . Hyperlipidemia 11/17/2015  . Sinusitis 06/30/2015  . Hernia, abdominal 08/05/2014  . Vaginitis and vulvovaginitis 06/19/2014  . 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 Lung abscess ([place]) 06/26/2013  . 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   05/07/2013  . Ovarian cystic mass 05/07/2013  .  s/p Serosal repair of colon 12/04/2012 12/15/2012  . Herpes genitalis in women 12/07/2012  . Anxiety state, unspecified 12/07/2012  . Unspecified asthma(493.90) 12/07/2012  . GERD (gastroesophageal reflux disease) 12/07/2012  . Pelvic mass in female s/p Robotic-assisted right salpingo-oophorectomy 12/04/2012  . SHINGLES 01/31/2009  . HEMORRHOIDS NOS, W/O COMPLICATIONS 58/04/9832  . Depression 11/25/2006  . Allergic rhinitis 11/25/2006  . 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 Human immunodeficiency virus (HIV) disease ([place]) 11/10/2006    Allergies  Allergen Reactions  . Seroquel [Quetiapine Fumarate] Swelling    Tongue swells   . Benzoin Other (See Comments)    blisters  . Latex Rash and Other (See Comments)  . Sulfamethoxazole-Trimethoprim Itching and Nausea And Vomiting    Prior to Admission medications   Medication Sig Start Date End Date Taking? Authorizing Provider  amoxicillin-clavulanate (AUGMENTIN) 875-125 MG tablet Take 1 tablet by mouth 2 (two) times daily. 05/26/17  Yes Dorena Dew, FNP  beclomethasone (QVAR) 40 MCG/ACT inhaler INHALE TWO PUFFS INTO THE LUNGS TWO TIMES DAILY. 12/23/16  Yes Michel Bickers, MD  bictegravir-emtricitabine-tenofovir AF (BIKTARVY) 50-200-25 MG TABS tablet Take 1 tablet by mouth daily. 03/07/17  Yes Kuppelweiser, Cassie L, RPH-CPP  cetirizine (ZYRTEC) 10 MG tablet Take 2 tablets (20 mg total) by  mouth at bedtime. 04/19/17  Yes Scot Jun, FNP  DENTA 5000 PLUS 1.1 % CREA dental cream USE SMALL AMOUNT ON TEETH AND GUMS EVERY EVENING SPIT OUT EXCESS AND DO NOT RINSE 03/03/17  Yes [provider]  famotidine (PEPCID) 20 MG tablet Take 1 tablet (20 mg total) by mouth daily. 04/18/17  Yes Little, Wenda Overland, MD  fexofenadine [facility] ALLERGY) 180 MG tablet Take 1 tablet (180 mg total) by mouth daily. 04/28/16  Yes Micheline Chapman, NP  hydrOXYzine (ATARAX/VISTARIL) 25 MG tablet Take 1 tablet (25 mg total) by mouth every 6 (six) hours as  needed for itching. 04/18/17  Yes Little, Wenda Overland, MD  lubiprostone (AMITIZA) 8 MCG capsule Take 8 mcg by mouth 2 (two) times daily with a meal.   Yes [provider]  lurasidone (LATUDA) 40 MG TABS tablet Take 1 tablet (40 mg total) by mouth daily with breakfast. 05/12/17  Yes Eksir, Richard Miu, MD  montelukast (SINGULAIR) 10 MG tablet Take 1 tablet (10 mg total) by mouth at bedtime. 06/21/16  Yes Michel Bickers, MD  Norethindrone Acetate-Ethinyl Estradiol (LOESTRIN 1.5/30, 21,) 1.5-30 MG-MCG tablet Take 1 tablet by mouth daily. 04/01/17 06/30/17 Yes Constant, Peggy, MD  oxymetazoline (AFRIN) 0.05 % nasal spray Place 1 spray into both nostrils 2 (two) times daily. For <7 days at a time.   Yes [provider]  PROAIR HFA 108 (90 Base) MCG/ACT inhaler INHALE TWO PUFFS INTO THE LUNGS EVERY 6 HOURS AS NEEDED FOR WHEEZING. 08/30/16  Yes Micheline Chapman, NP  ranitidine (ZANTAC) 150 MG tablet Take 1 tablet (150 mg total) by mouth 2 (two) times daily. 04/19/17  Yes Scot Jun, FNP  rizatriptan (MAXALT-MLT) 5 MG disintegrating tablet Take 2 tablets (10 mg total) by mouth as needed for migraine. May repeat in 2 hours if needed 01/20/17  Yes Scot Jun, FNP  tiZANidine (ZANAFLEX) 4 MG capsule Take 1 capsule (4 mg total) by mouth 3 (three) times daily as needed for muscle spasms. 01/20/17  Yes Scot Jun, FNP  Topiramate ER 100 MG CS24 Take 100 mg by mouth at bedtime. 01/20/17  Yes Scot Jun, FNP  triamcinolone cream (KENALOG) 0.1 % Apply 1 application topically 2 (two) times daily. 04/19/17  Yes Scot Jun, FNP  valACYclovir (VALTREX) 500 MG tablet TAKE 1 TABLET (500 MG TOTAL) BY MOUTH DAILY. 03/29/16  Yes Campbell Riches, MD    Past Medical, Surgical Family and Social History reviewed and updated.    Objective:   Today's Vitals   05/30/17 1427  BP: 105/66  Pulse: 82  Resp: 14  Temp: 98.2 F (36.8 C)  TempSrc: Oral  SpO2: 100%  Weight: 195  lb (88.5 kg)  Height: 5\' 4"  (1.626 m)    Wt Readings from Last 3 Encounters:  05/30/17 195 lb (88.5 kg)  05/26/17 193 lb (87.5 kg)  04/19/17 189 lb 9.6 oz (86 kg)   Physical Exam  Constitutional: She is oriented to person, place, and time. She appears well-developed and well-nourished.  HENT:  Head: Normocephalic and atraumatic.  Neck: Normal range of motion. Neck supple.  Cardiovascular: Normal rate, regular rhythm, normal heart sounds and intact distal pulses.   Pulmonary/Chest: Effort normal and breath sounds normal.  Musculoskeletal: Normal range of motion. She exhibits tenderness.  Neurological: She is alert and oriented to person, place, and time.  Psychiatric: She has a normal mood and affect. Her behavior is normal. Judgment and thought content normal.  Assessment & Plan:  1. Lumbar radiculopathy, Take Prednisone 20 mg,  in mornings with breakfast as follows: Take 3 pills for 3 days, Take 2 pills for 3 days, and Take 1 pill for 3 days. Complete all medication. - DG Lumbar Spine 2-3 Views  Return in 2 weeks for follow-up of back pain.  Carroll Sage. Kenton Kingfisher, MSN, FNP-C [facility]  [address] [place], [place] [address] [phone]

## 2017-05-30 NOTE — ED Notes (Signed)
Pt called x3 for triage; no response.

## 2017-05-30 NOTE — Patient Instructions (Addendum)
Go down to the first floor to the radiology department to obtain an x-ray of her back.  or lumbar radiculopathy start Prednisone 20 mg, in mornings with breakfast as follows: Take 3 pills for 3 days, Take 2 pills for 3 days, and Take 1 pill for 3 days.  Complete all medication.  Take Zanaflex as prescribed for pain.

## 2017-05-31 ENCOUNTER — Ambulatory Visit (INDEPENDENT_AMBULATORY_CARE_PROVIDER_SITE_OTHER): Payer: BC Managed Care – PPO

## 2017-05-31 ENCOUNTER — Other Ambulatory Visit: Payer: Self-pay | Admitting: *Deleted

## 2017-05-31 DIAGNOSIS — J45991 Cough variant asthma: Secondary | ICD-10-CM

## 2017-05-31 DIAGNOSIS — Z23 Encounter for immunization: Secondary | ICD-10-CM | POA: Diagnosis not present

## 2017-05-31 MED ORDER — BECLOMETHASONE DIPROPIONATE 40 MCG/ACT IN AERS: INHALATION_SPRAY | RESPIRATORY_TRACT | 2 refills | Status: DC

## 2017-06-13 ENCOUNTER — Other Ambulatory Visit: Payer: Self-pay | Admitting: Pharmacist

## 2017-06-16 ENCOUNTER — Ambulatory Visit: Payer: Self-pay | Admitting: Infectious Diseases

## 2017-06-17 ENCOUNTER — Ambulatory Visit (INDEPENDENT_AMBULATORY_CARE_PROVIDER_SITE_OTHER): Payer: BC Managed Care – PPO | Admitting: Family Medicine

## 2017-06-17 ENCOUNTER — Encounter: Payer: Self-pay | Admitting: Family Medicine

## 2017-06-17 VITALS — BP 134/70 | HR 75 | Temp 98.2°F | Ht 64.0 in | Wt 195.0 lb

## 2017-06-17 DIAGNOSIS — M545 Low back pain, unspecified: Secondary | ICD-10-CM

## 2017-06-17 MED ORDER — TRIAMCINOLONE ACETONIDE 0.1 % EX CREA
1.0000 | TOPICAL_CREAM | Freq: Two times a day (BID) | CUTANEOUS | 0 refills | Status: DC
Start: 2017-06-17 — End: 2017-08-19

## 2017-06-17 MED ORDER — MELOXICAM 15 MG PO TABS: 15.0000 mg | ORAL_TABLET | Freq: Every day | ORAL | 0 refills | Status: DC

## 2017-06-17 MED ORDER — LIDOCAINE 5 % EX PTCH: 1.0000 | MEDICATED_PATCH | CUTANEOUS | 0 refills | Status: DC

## 2017-06-17 NOTE — Patient Instructions (Signed)
Back pain can take up to 6 weeks to completely resolve.  Take Meloxicam once weekly and apply lidocaine patch once every 24 hours for back pain.   Back Pain, Adult Back pain is very common. The pain often gets better over time. The cause of back pain is usually not dangerous. Most people can learn to manage their back pain on their own. Follow these instructions at home: Watch your back pain for any changes. The following actions may help to lessen any pain you are feeling:  Stay active. Start with short walks on flat ground if you can. Try to walk farther each day.  Exercise regularly as told by your doctor. Exercise helps your back heal faster. It also helps avoid future injury by keeping your muscles strong and flexible.  Do not sit, drive, or stand in one place for more than 30 minutes.  Do not stay in bed. Resting more than 1-2 days can slow down your recovery.  Be careful when you bend or lift an object. Use good form when lifting: ? Bend at your knees. ? Keep the object close to your body. ? Do not twist.  Sleep on a firm mattress. Lie on your side, and bend your knees. If you lie on your back, put a pillow under your knees.  Take medicines only as told by your doctor.  Put ice on the injured area. ? Put ice in a plastic bag. ? Place a towel between your skin and the bag. ? Leave the ice on for 20 minutes, 2-3 times a day for the first 2-3 days. After that, you can switch between ice and heat packs.  Avoid feeling anxious or stressed. Find good ways to deal with stress, such as exercise.  Maintain a healthy weight. Extra weight puts stress on your back.  Contact a doctor if:  You have pain that does not go away with rest or medicine.  You have worsening pain that goes down into your legs or buttocks.  You have pain that does not get better in one week.  You have pain at night.  You lose weight.  You have a fever or chills. Get help right away if:  You cannot  control when you poop (bowel movement) or pee (urinate).  Your arms or legs feel weak.  Your arms or legs lose feeling (numbness).  You feel sick to your stomach (nauseous) or throw up (vomit).  You have belly (abdominal) pain.  You feel like you may pass out (faint). This information is not intended to replace advice given to you by your health care provider. Make sure you discuss any questions you have with your health care provider. Document Released: 01/19/2008 Document Revised: 01/08/2016 Document Reviewed: 12/04/2013 Elsevier Interactive Patient Education  [employer].

## 2017-06-17 NOTE — Progress Notes (Signed)
Patient ID: [name], female    DOB: [birth year], 36 y.o.   MRN: [id number]  PCP: Scot Jun, FNP  Chief Complaint  Patient presents with  . Follow-up    2 WEEKS    Subjective:  HPI [name] is a 36 y.o. female presents for evaluation of low back pain secondary to lumbar radiculopathy. Reports pain completely resolved after completing prednisone. However he now complains that back pain is still present, with an intensity of 6/10. She is taking ibuprofen which is only reducing her pain to 6/10. Characterizes pain as localized to her lower back and aching. Due to her schedule, she is not interested in participating in physical therapy. Most recent x-ray of her back was negative of any acute findings.  Social History   Social History  . Marital status: Single    Spouse name: N/A  . Number of children: 1  . Years of education: 86   Occupational History  . Not on file.   Social History Main Topics  . Smoking status: Never Smoker  . Smokeless tobacco: Never Used  . Alcohol use No     Comment: OCC.  . Drug use: No  . Sexual activity: No   Other Topics Concern  . Not on file   Social History Narrative   Patient lives alone.   Patient works at home.   Patient has hs education.   Patient has 1 child.   Patient drinks caffeine once in a while.    Family History  Problem Relation Age of Onset  . Arthritis/Rheumatoid Mother   . Hypertension Mother   . Cancer Maternal Aunt        brand and lung  . Cancer Maternal Grandmother        breasts  . Cancer Paternal Grandmother        breast and kidney   Review of Systems See HPI Patient Active Problem List   Diagnosis Date Noted  . Bipolar mixed affective disorder, moderate ([place]) 03/20/2017  . Chronic tension headaches 01/23/2017  . Acute upper respiratory infection 06/21/2016  . Hyperlipidemia 11/17/2015  . Sinusitis 06/30/2015  . Hernia, abdominal 08/05/2014  . Vaginitis and vulvovaginitis  06/19/2014  . 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 Lung abscess ([place]) 06/26/2013  . 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   05/07/2013  . Ovarian cystic mass 05/07/2013  . s/p Serosal repair of colon 12/04/2012 12/15/2012  . Herpes genitalis in women 12/07/2012  . Anxiety state, unspecified 12/07/2012  . Unspecified asthma(493.90) 12/07/2012  . GERD (gastroesophageal reflux disease) 12/07/2012  . Pelvic mass in female s/p Robotic-assisted right salpingo-oophorectomy 12/04/2012  . SHINGLES 01/31/2009  . HEMORRHOIDS NOS, W/O COMPLICATIONS 50/53/9767  . Depression 11/25/2006  . Allergic rhinitis 11/25/2006  . 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 Human immunodeficiency virus (HIV) disease ([place]) 11/10/2006    Allergies  Allergen Reactions  . Seroquel [Quetiapine Fumarate] Swelling    Tongue swells   . Benzoin Other (See Comments)    blisters  . Latex Rash and Other (See Comments)  . Sulfamethoxazole-Trimethoprim Itching and Nausea And Vomiting    Prior to Admission medications   Medication Sig Start Date End Date Taking? Authorizing Provider  amoxicillin-clavulanate (AUGMENTIN) 875-125 MG tablet Take 1 tablet by mouth 2 (two) times daily. 05/26/17  Yes Dorena Dew, FNP  beclomethasone (QVAR) 40 MCG/ACT inhaler INHALE TWO PUFFS INTO THE LUNGS TWO TIMES DAILY. 05/31/17  Yes Campbell Riches,  MD  bictegravir-emtricitabine-tenofovir AF (BIKTARVY) 50-200-25 MG TABS tablet Take 1 tablet by mouth daily. 03/07/17  Yes Kuppelweiser, Cassie L, RPH-CPP  cetirizine (ZYRTEC) 10 MG tablet Take 2 tablets (20 mg total) by mouth at bedtime. 04/19/17  Yes Scot Jun, FNP  DENTA 5000 PLUS 1.1 % CREA dental cream USE SMALL AMOUNT ON TEETH AND GUMS EVERY EVENING SPIT OUT EXCESS AND DO NOT RINSE 03/03/17  Yes [provider]  famotidine (PEPCID) 20 MG tablet Take 1 tablet (20 mg total) by mouth daily. 04/18/17  Yes Little, Wenda Overland,  MD  fexofenadine [facility] ALLERGY) 180 MG tablet Take 1 tablet (180 mg total) by mouth daily. 04/28/16  Yes Micheline Chapman, NP  hydrOXYzine (ATARAX/VISTARIL) 25 MG tablet Take 1 tablet (25 mg total) by mouth every 6 (six) hours as needed for itching. 04/18/17  Yes Little, Wenda Overland, MD  lubiprostone (AMITIZA) 8 MCG capsule Take 8 mcg by mouth 2 (two) times daily with a meal.   Yes [provider]  lurasidone (LATUDA) 40 MG TABS tablet Take 1 tablet (40 mg total) by mouth daily with breakfast. 05/12/17  Yes Eksir, Richard Miu, MD  montelukast (SINGULAIR) 10 MG tablet Take 1 tablet (10 mg total) by mouth at bedtime. 06/21/16  Yes Michel Bickers, MD  Norethindrone Acetate-Ethinyl Estradiol (LOESTRIN 1.5/30, 21,) 1.5-30 MG-MCG tablet Take 1 tablet by mouth daily. 04/01/17 06/30/17 Yes Constant, Peggy, MD  oxymetazoline (AFRIN) 0.05 % nasal spray Place 1 spray into both nostrils 2 (two) times daily. For <7 days at a time.   Yes [provider]  predniSONE (DELTASONE) 20 MG tablet Take 3 PO QAM x3days, 2 PO QAM x3days, 1 PO QAM x3days 05/30/17  Yes Scot Jun, FNP  PROAIR HFA 108 (90 Base) MCG/ACT inhaler INHALE TWO PUFFS INTO THE LUNGS EVERY 6 HOURS AS NEEDED FOR WHEEZING. 08/30/16  Yes Micheline Chapman, NP  ranitidine (ZANTAC) 150 MG tablet Take 1 tablet (150 mg total) by mouth 2 (two) times daily. 04/19/17  Yes Scot Jun, FNP  rizatriptan (MAXALT-MLT) 5 MG disintegrating tablet Take 2 tablets (10 mg total) by mouth as needed for migraine. May repeat in 2 hours if needed 01/20/17  Yes Scot Jun, FNP  tiZANidine (ZANAFLEX) 4 MG capsule Take 1 capsule (4 mg total) by mouth 3 (three) times daily as needed for muscle spasms. 01/20/17  Yes Scot Jun, FNP  Topiramate ER 100 MG CS24 Take 100 mg by mouth at bedtime. 01/20/17  Yes Scot Jun, FNP  triamcinolone cream (KENALOG) 0.1 % Apply 1 application topically 2 (two) times daily. 04/19/17  Yes Scot Jun, FNP  valACYclovir (VALTREX) 500 MG tablet TAKE 1 TABLET (500 MG TOTAL) BY MOUTH DAILY. 03/29/16  Yes Campbell Riches, MD    Past Medical, Surgical Family and Social History reviewed and updated.    Objective:   Today's Vitals   06/17/17 0846  BP: 134/70  Pulse: 75  Temp: 98.2 F (36.8 C)  TempSrc: Oral  SpO2: 100%  Weight: 195 lb (88.5 kg)  Height: 5\' 4"  (1.626 m)    Wt Readings from Last 3 Encounters:  06/17/17 195 lb (88.5 kg)  05/30/17 195 lb (88.5 kg)  05/26/17 193 lb (87.5 kg)   Physical Exam Constitutional: Patient appears well-developed and well-nourished. No distress. HENT: Normocephalic, atraumatic, External right and left ear normal. Oropharynx is clear and moist.  Neck: Normal ROM. Neck supple. No JVD. No tracheal deviation. No thyromegaly.  CVS: RRR, S1/S2 +, no murmurs, no gallops, no carotid bruit.  Pulmonary: Effort and breath sounds normal, no stridor, rhonchi, wheezes, rales.  Abdominal: Soft. BS +, no distension, tenderness, rebound or guarding.  Musculoskeletal: Normal range of motion. No edema and no tenderness.  Skin: Skin is warm and dry. No rash noted. Not diaphoretic. No erythema. No pallor. Psychiatric: Normal mood and affect. Behavior, judgment, thought content normal.   Assessment & Plan:  1. Acute bilateral low back pain without sciatica, recommended PT, although, patient declined physical therapy at this time. Will trial Meloxicam 15 mg daily as needed for back pain in combination with Lidocaine patches.  Patient advised that it may take up to 6 weeks for back pain to completely resolve. She is to follow-up if symptoms worsen or do not completely resolve.  -RTC: keep scheduled follow-up and may return sooner as needed.   Carroll Sage. Kenton Kingfisher, MSN, FNP-C [facility]  [address] [place], [place] [address] [phone]

## 2017-06-30 ENCOUNTER — Telehealth: Payer: Self-pay

## 2017-06-30 MED ORDER — DICLOFENAC SODIUM 1 % TD GEL: 2.0000 g | Freq: Four times a day (QID) | TRANSDERMAL | 1 refills | Status: DC

## 2017-06-30 NOTE — Telephone Encounter (Signed)
Medicaid will not cover lidocaine patches for back pain. I will send over diclofenac gel. Gel can be applied to back up to 4 times per day as needed for back pain.   Carroll Sage. Kenton Kingfisher, MSN, FNP-C [facility]  [address] [place], [place] [address] [phone]

## 2017-06-30 NOTE — Telephone Encounter (Signed)
Were you going to send in a different script for patient cause insurance will not cover Lidoderm patch.

## 2017-07-01 ENCOUNTER — Other Ambulatory Visit: Payer: Self-pay | Admitting: *Deleted

## 2017-07-01 DIAGNOSIS — B009 Herpesviral infection, unspecified: Secondary | ICD-10-CM

## 2017-07-01 DIAGNOSIS — B20 Human immunodeficiency virus [HIV] disease: Secondary | ICD-10-CM

## 2017-07-01 MED ORDER — VALACYCLOVIR HCL 500 MG PO TABS: ORAL_TABLET | ORAL | 0 refills | Status: DC

## 2017-07-01 NOTE — Telephone Encounter (Signed)
Patient notified

## 2017-07-04 ENCOUNTER — Encounter: Payer: Self-pay | Admitting: Infectious Diseases

## 2017-07-04 ENCOUNTER — Ambulatory Visit (INDEPENDENT_AMBULATORY_CARE_PROVIDER_SITE_OTHER): Payer: BC Managed Care – PPO | Admitting: Infectious Diseases

## 2017-07-04 VITALS — BP 126/87 | HR 96 | Temp 98.2°F | Wt 190.0 lb

## 2017-07-04 DIAGNOSIS — R87612 Low grade squamous intraepithelial lesion on cytologic smear of cervix (LGSIL): Secondary | ICD-10-CM | POA: Diagnosis not present

## 2017-07-04 DIAGNOSIS — Z79899 Other long term (current) drug therapy: Secondary | ICD-10-CM

## 2017-07-04 DIAGNOSIS — Z113 Encounter for screening for infections with a predominantly sexual mode of transmission: Secondary | ICD-10-CM | POA: Diagnosis not present

## 2017-07-04 DIAGNOSIS — B20 Human immunodeficiency virus [HIV] disease: Secondary | ICD-10-CM | POA: Diagnosis not present

## 2017-07-04 DIAGNOSIS — J45991 Cough variant asthma: Secondary | ICD-10-CM | POA: Diagnosis not present

## 2017-07-04 DIAGNOSIS — F3162 Bipolar disorder, current episode mixed, moderate: Secondary | ICD-10-CM

## 2017-07-04 MED ORDER — MONTELUKAST SODIUM 10 MG PO TABS: 10.0000 mg | ORAL_TABLET | Freq: Every day | ORAL | 5 refills | Status: DC

## 2017-07-04 MED ORDER — ALBUTEROL SULFATE HFA 108 (90 BASE) MCG/ACT IN AERS: INHALATION_SPRAY | RESPIRATORY_TRACT | 2 refills | Status: DC

## 2017-07-04 NOTE — Progress Notes (Signed)
   Subjective:    Patient ID: [name], female    DOB: [birth year], 36 y.o.   MRN: [id number]  HPI 36 yo F with hx of HIV+ since 2005 on prezista-genvoya --> biktarvy. 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   She has a hx of CIN1-HPV. She underwent cryo on 10-28-16.  She needs repeat PAP 07-2017.  Has nexplanon.   She also has a hx of bipolar, is followed by psychiatry/[facility].  States her face has been breaking out since she started rx.   HIV 1 RNA Quant (copies/mL)  Date Value  04/19/2017 21 (H)  04/06/2017 <20 DETECTED (A)  01/11/2017 27 (H)   CD4 T Cell Abs (/uL)  Date Value  04/19/2017 1,130  04/06/2017 970  01/11/2017 1,510    Review of Systems  Constitutional: Negative for appetite change, chills, fever and unexpected weight change.  Gastrointestinal: Positive for constipation. Negative for diarrhea.  Genitourinary: Negative for difficulty urinating and menstrual problem.  Skin: Positive for rash.  Psychiatric/Behavioral: Positive for sleep disturbance. Negative for dysphoric mood.  sleep irregular, mood better. Feels like latuda is working.  Please see HPI. All other systems reviewed and negative.     Objective:   Physical Exam  Constitutional: She appears well-developed and well-nourished.  HENT:  Mouth/Throat: No oropharyngeal exudate.  Eyes: EOM are normal. Pupils are equal, round, and reactive to light.  Neck: Neck supple.  Cardiovascular: Normal rate, regular rhythm and normal heart sounds.  Pulmonary/Chest: Effort normal.  Abdominal: Soft. Bowel sounds are normal. There is no tenderness. There is no rebound.  Musculoskeletal: She exhibits no edema.  Lymphadenopathy:    She has no cervical adenopathy.  Skin: Rash noted.  Psychiatric: She has a normal mood and affect.       Assessment & Plan:

## 2017-07-04 NOTE — Assessment & Plan Note (Signed)
She is doing well Vac are up to date.  Offered/refused condoms.  Will continue biktarvy.

## 2017-07-04 NOTE — Assessment & Plan Note (Signed)
Will schedule her for appt next month.

## 2017-07-04 NOTE — Assessment & Plan Note (Signed)
Appreciate f/u at [facility].

## 2017-07-04 NOTE — Assessment & Plan Note (Signed)
Has been well controlled until 2 days ago. Is out of her inhaler.  Needs refill of her proventil and singulair

## 2017-07-20 ENCOUNTER — Other Ambulatory Visit: Payer: Self-pay

## 2017-08-02 ENCOUNTER — Ambulatory Visit: Payer: BC Managed Care – PPO | Admitting: Infectious Diseases

## 2017-08-03 ENCOUNTER — Ambulatory Visit (INDEPENDENT_AMBULATORY_CARE_PROVIDER_SITE_OTHER): Payer: BC Managed Care – PPO | Admitting: Infectious Diseases

## 2017-08-03 ENCOUNTER — Ambulatory Visit: Payer: Self-pay | Admitting: Infectious Diseases

## 2017-08-03 ENCOUNTER — Other Ambulatory Visit (HOSPITAL_COMMUNITY)
Admission: RE | Admit: 2017-08-03 | Discharge: 2017-08-03 | Disposition: A | Discharge status: Home / Self Care | Payer: BC Managed Care – PPO | Source: Ambulatory Visit | Attending: Infectious Diseases | Admitting: Infectious Diseases

## 2017-08-03 DIAGNOSIS — Z23 Encounter for immunization: Secondary | ICD-10-CM

## 2017-08-03 DIAGNOSIS — Z124 Encounter for screening for malignant neoplasm of cervix: Secondary | ICD-10-CM

## 2017-08-03 DIAGNOSIS — Z789 Other specified health status: Secondary | ICD-10-CM | POA: Diagnosis not present

## 2017-08-03 DIAGNOSIS — Z0184 Encounter for antibody response examination: Secondary | ICD-10-CM

## 2017-08-03 NOTE — Patient Instructions (Signed)
Boric Acid vaginal suppository What is this medicine? BORIC ACID (BOHR ik AS id) helps to promote the proper acid balance in the vagina. It is used to help treat yeast infections of the vagina and relieve symptoms such as itching and burning. This medicine may be used for other purposes; ask your health care provider or pharmacist if you have questions. COMMON BRAND NAME(S): Hylafem What should I tell my health care provider before I take this medicine? They need to know if you have any of these conditions: -diabetes -frequent infections -HIV or AIDS -immune system problems -an unusual or allergic reaction to boric acid, other medicines, foods, dyes, or preservatives -pregnant or trying to get pregnant -breast-feeding How should I use this medicine? This medicine is for use in the vagina. Do not take by mouth. Follow the directions on the prescription label. Read package directions carefully before using. Wash hands before and after use. Use this medicine at bedtime, unless otherwise directed by your doctor. Do not use your medicine more often than directed. Do not stop using this medicine except on your doctor's advice. Talk to your pediatrician regarding the use of this medicine in children. This medicine is not approved for use in children. Overdosage: If you think you have taken too much of this medicine contact a poison control center or emergency room at once. NOTE: This medicine is only for you. Do not share this medicine with others. What if I miss a dose? If you miss a dose, use it as soon as you can. If it is almost time for your next dose, use only that dose. Do not use double or extra doses. What may interact with this medicine? Interactions are not expected. Do not use any other vaginal products without telling your doctor or health care professional. This list may not describe all possible interactions. Give your health care provider a list of all the medicines, herbs,  non-prescription drugs, or dietary supplements you use. Also tell them if you smoke, drink alcohol, or use illegal drugs. Some items may interact with your medicine. What should I watch for while using this medicine? Tell your doctor or health care professional if your symptoms do not start to get better within a few days. It is better not to have sex until you have finished your treatment. This medicine may damage condoms or diaphragms and cause them not to work properly. It may also decrease the effect of vaginal spermicides. Do not rely on any of these methods to prevent sexually transmitted diseases or pregnancy while you are using this medicine. Vaginal medicines usually will come out of the vagina during treatment. To keep the medicine from getting on your clothing, wear a panty liner. The use of tampons is not recommended. To help clear up the infection, wear freshly washed cotton, not synthetic, underwear. What side effects may I notice from receiving this medicine? Side effects that you should report to your doctor or health care professional as soon as possible: -allergic reactions like skin rash, itching or hives -vaginal irritation, redness, or burning Side effects that usually do not require medical attention (report to your doctor or health care professional if they continue or are bothersome): -vaginal discharge This list may not describe all possible side effects. Call your doctor for medical advice about side effects. You may report side effects to FDA at 1-800-FDA-1088. Where should I keep my medicine? Keep out of the reach of children. Store in a cool, dry place between 15  and 30 degrees C (59 and 86 degrees F). Keep away from sunlight. Throw away any unused medicine after the expiration date. NOTE: This sheet is a summary. It may not cover all possible information. If you have questions about this medicine, talk to your doctor, pharmacist, or health care provider.  2018  Elsevier/Gold Standard (2015-09-04 07:29:58)

## 2017-08-03 NOTE — Progress Notes (Signed)
     Subjective:    [name] is a 36 y.o. G10P1021 African American female here for an annual pelvic exam and pap smear.  No LMP recorded. Patient has had an implant.     Current GYN complaints or concerns: frequent episodes of BV. Concern for infection recurrence today. Comes in 'cycles.'  Past Medical History:  Diagnosis Date  . Allergy   . Anemia   . Anxiety   . Anxiety state, unspecified 12/07/2012  . Arthritis   . Asthma   . Bipolar disorder, unspecified ([place]) 12/07/2012  . Depression   . Diabetes mellitus without complication ([place])    no meds currently- controlled with diet  . GERD (gastroesophageal reflux disease)   . GERD (gastroesophageal reflux disease) 12/07/2012  . Headache(784.0)    migraines  . Herpes genitalis in women 12/07/2012  . HIV infection ([place])   . Human immunodeficiency virus (HIV) disease ([place]) 12/07/2012  . Neuromuscular disorder ([place])    carpal tunnel in both arms  . Type II or unspecified type diabetes mellitus without mention of complication, not stated as uncontrolled 12/07/2012  . Unspecified asthma(493.90) 12/07/2012    Gynecologic History Q6P6195  No LMP recorded. Patient has had an implant. Contraception: Nexplanon Last Pap: 07/2016. Results were: abnormal LSIL. Colposcopy and cryo procedure  Last Mammogram: never; interesed in discussing screening as both maternal and paternal grandmothers died from breast cancer.   Review of Systems Patient denies any abdominal/pelvic pain, problems with bowel movements, urination, vaginal discharge or intercourse. Frequent BV as above.   Objective:  There were no vitals taken for this visit. Physical Exam  Constitutional: Well developed, well nourished, no acute distress. She is alert and oriented x3.  Pelvic: External genitalia is normal in appearance. The vagina is normal in appearance. The cervix is bulbous and easily visualized. Scant white material noted on cervix that was easily removed. No  odor. No CMT, normal expected cervical mucus present.  Psych: She has a normal mood and affect.    Assessment:  Normal pelvic exam. Thin prep pap was obtained and sent for cytology, HPV and GC/C today. Check BV/Candidiasis.   Plan:  Health Maintenance = Return in 1 year for annual pap screening unless indicated sooner.   Mammogram = discussed that recommended screening starts at 37 yo according to ACS however could consider earlier screening at 76 with history of family risk. I informed her to call her insurance to see what recommendations they follow.   Hepatitis testing = father was 'diagnosed with hepatitis' recently. Uncertain as to which. She would like to get hep a vaccine if she was not vaccinated against this and check hep b immunity since her repeated immunizations in 2015. With increase in hep a in our surrounding states I agree this would be good to immunize her. Vaccine #2 at next appointment with Dr. Johnnye Sima.   HIV = F/U as scheduled with Dr. Johnnye Sima for ongoing HIV care.   I spent greater than 15 minutes with the patient including greater than 50% of time in face to face counsel of the patient re HIV, BV, mammogram recommendations, hepatitis risk and in coordination of their care.  Janene Madeira, MSN, NP-C [facility] 08/03/2017 3:26 PM

## 2017-08-04 ENCOUNTER — Ambulatory Visit (HOSPITAL_COMMUNITY): Payer: BC Managed Care – PPO | Admitting: Psychiatry

## 2017-08-04 ENCOUNTER — Encounter (HOSPITAL_COMMUNITY): Payer: Self-pay | Admitting: Psychiatry

## 2017-08-04 VITALS — BP 108/74 | HR 78 | Ht 64.0 in | Wt 198.0 lb

## 2017-08-04 DIAGNOSIS — F321 Major depressive disorder, single episode, moderate: Secondary | ICD-10-CM | POA: Diagnosis not present

## 2017-08-04 DIAGNOSIS — F3162 Bipolar disorder, current episode mixed, moderate: Secondary | ICD-10-CM

## 2017-08-04 DIAGNOSIS — F0630 Mood disorder due to known physiological condition, unspecified: Secondary | ICD-10-CM

## 2017-08-04 DIAGNOSIS — F4312 Post-traumatic stress disorder, chronic: Secondary | ICD-10-CM

## 2017-08-04 LAB — HEPATITIS B SURFACE ANTIBODY,QUALITATIVE: Hep B S Ab: REACTIVE — AB

## 2017-08-04 MED ORDER — LURASIDONE HCL 80 MG PO TABS: 80.0000 mg | ORAL_TABLET | Freq: Every day | ORAL | 1 refills | Status: DC

## 2017-08-04 MED ORDER — TRAZODONE HCL 100 MG PO TABS: 100.0000 mg | ORAL_TABLET | Freq: Every day | ORAL | 1 refills | Status: DC

## 2017-08-04 NOTE — Progress Notes (Signed)
BH MD/PA/NP OP Progress Note  08/04/2017 3:54 PM [name]  MRN:  [id number]  Chief Complaint: Doing a little better, but still depressed HPI: Patient has had interval improvement with Latuda 40 mg, we discussed increasing to 80 mg.  Reiterated the risks of metabolic and TD.  Discussed augmenting with trazodone nightly for sleep.  No thoughts of suicide or harm to others.  She is excited for Christmas break, to be able to spend time with her daughter.  She would like to reestablish therapy care in this office, and feels encouraged that things can get better..  Visit Diagnosis:    ICD-10-CM   1. Chronic post-traumatic stress disorder (PTSD) F43.12   2. Major depressive disorder, single episode, moderate (HCC) F32.1 lurasidone (LATUDA) 80 MG TABS tablet    traZODone (DESYREL) 100 MG tablet  3. Mood disorder in conditions classified elsewhere F06.30 lurasidone (LATUDA) 80 MG TABS tablet    traZODone (DESYREL) 100 MG tablet  4. Bipolar mixed affective disorder, moderate (HCC) F31.62     Past Psychiatric History: See intake H&P for full details. Reviewed, with no updates at this time.   Past Medical History:  Past Medical History:  Diagnosis Date  . Allergy   . Anemia   . Anxiety   . Anxiety state, unspecified 12/07/2012  . Arthritis   . Asthma   . Bipolar disorder, unspecified ([place]) 12/07/2012  . Depression   . Diabetes mellitus without complication ([place])    no meds currently- controlled with diet  . GERD (gastroesophageal reflux disease)   . GERD (gastroesophageal reflux disease) 12/07/2012  . Headache(784.0)    migraines  . Herpes genitalis in women 12/07/2012  . HIV infection ([place])   . Human immunodeficiency virus (HIV) disease ([place]) 12/07/2012  . Neuromuscular disorder ([place])    carpal tunnel in both arms  . Type II or unspecified type diabetes mellitus without mention of complication, not stated as uncontrolled 12/07/2012  . Unspecified asthma(493.90) 12/07/2012    Past  Surgical History:  Procedure Laterality Date  . CESAREAN SECTION    . COLON SURGERY     colon repair after salpingo  . INSERTION OF MESH N/A 11/06/2014   Procedure: INSERTION OF MESH;  Surgeon: Stark Klein, MD;  Location: [facility];  Service: General;  Laterality: N/A;  . LAPAROSCOPIC HEPATECTOMY N/A 06/28/2013   Procedure: LAPAROSCOPIC EXCISION HEPATIC MASS;  Surgeon: Stark Klein, MD;  Location: [place];  Service: General;  Laterality: N/A;  . LAPAROSCOPIC LYSIS OF ADHESIONS N/A 12/04/2012   Procedure: LAPAROSCOPIC LYSIS OF ADHESIONS;  Surgeon: [name], MD;  Location: [facility];  Service: Gynecology;  Laterality: N/A;  . LAPAROTOMY N/A 12/04/2012   Procedure: EXPLORATORY LAPAROTOMY;  Surgeon: [name], MD;  Location: [facility];  Service: Gynecology;  Laterality: N/A;  repair of serosal injury  . ROBOTIC ASSISTED BILATERAL SALPINGO OOPHERECTOMY Right 12/04/2012   Procedure: ROBOTIC ASSISTED Right  SALPINGO OOPHORECTOMY;  Surgeon: [name], MD;  Location: [facility];  Service: Gynecology;  Laterality: Right;  . VENTRAL HERNIA REPAIR N/A 11/06/2014   Procedure: LAPAROSCOPIC ASSITED INCARCERATED VENTRAL HERNIA REPAIR WITH MESH;  Surgeon: Stark Klein, MD;  Location: [facility];  Service: General;  Laterality: N/A;    Family Psychiatric History: See intake H&P for full details. Reviewed, with no updates at this time.   Family History:  Family History  Problem Relation Age of Onset  . Arthritis/Rheumatoid Mother   . Hypertension Mother   . Cancer Maternal Aunt  brand and lung  . Cancer Maternal Grandmother        breasts  . Cancer Paternal Grandmother        breast and kidney    Social History:  Social History   Socioeconomic History  . Marital status: Single    Spouse name: None  . Number of children: 1  . Years of education: 16  . Highest education level: None  Social Needs  . Financial resource strain: None  . Food insecurity - worry: None  . Food  insecurity - inability: None  . Transportation needs - medical: None  . Transportation needs - non-medical: None  Occupational History  . None  Tobacco Use  . Smoking status: Never Smoker  . Smokeless tobacco: Never Used  Substance and Sexual Activity  . Alcohol use: No    Alcohol/week: 0.0 oz    Comment: OCC.about once a month   . Drug use: No  . Sexual activity: No    Partners: Male    Birth control/protection: Implant  Other Topics Concern  . None  Social History Narrative   Patient lives alone.   Patient works at home.   Patient has hs education.   Patient has 1 child.   Patient drinks caffeine once in a while.    Allergies:  Allergies  Allergen Reactions  . Seroquel [Quetiapine Fumarate] Swelling    Tongue swells   . Benzoin Other (See Comments)    blisters  . Latex Rash and Other (See Comments)  . Sulfamethoxazole-Trimethoprim Itching and Nausea And Vomiting    Metabolic Disorder Labs: Lab Results  Component Value Date   HGBA1C 5.2 04/27/2016   No results found for: PROLACTIN Lab Results  Component Value Date   CHOL 210 (H) 04/06/2017   TRIG 123 04/06/2017   HDL 36 (L) 04/06/2017   CHOLHDL 5.8 (H) 04/06/2017   VLDL 25 04/06/2017   LDLCALC 149 (H) 04/06/2017   LDLCALC 160 (H) 01/11/2017   Lab Results  Component Value Date   TSH 1.29 09/13/2016    Therapeutic Level Labs: No results found for: LITHIUM No results found for: VALPROATE No components found for:  CBMZ  Current Medications: Current Outpatient Medications  Medication Sig Dispense Refill  . albuterol (PROAIR HFA) 108 (90 Base) MCG/ACT inhaler INHALE TWO PUFFS INTO THE LUNGS EVERY 6 HOURS AS NEEDED FOR WHEEZING. 8.5 Inhaler 2  . beclomethasone (QVAR) 40 MCG/ACT inhaler INHALE TWO PUFFS INTO THE LUNGS TWO TIMES DAILY. 8.7 Inhaler 2  . bictegravir-emtricitabine-tenofovir AF (BIKTARVY) 50-200-25 MG TABS tablet Take 1 tablet by mouth daily. 30 tablet 6  . cetirizine (ZYRTEC) 10 MG tablet  Take 2 tablets (20 mg total) by mouth at bedtime. 60 tablet 0  . DENTA 5000 PLUS 1.1 % CREA dental cream USE SMALL AMOUNT ON TEETH AND GUMS EVERY EVENING SPIT OUT EXCESS AND DO NOT RINSE  3  . fexofenadine (ALLEGRA ALLERGY) 180 MG tablet Take 1 tablet (180 mg total) by mouth daily. 90 tablet 1  . lurasidone (LATUDA) 80 MG TABS tablet Take 1 tablet (80 mg total) by mouth daily with breakfast. 90 tablet 1  . meloxicam (MOBIC) 15 MG tablet Take 1 tablet (15 mg total) by mouth daily. 30 tablet 0  . montelukast (SINGULAIR) 10 MG tablet Take 1 tablet (10 mg total) at bedtime by mouth. 30 tablet 5  . oxymetazoline (AFRIN) 0.05 % nasal spray Place 1 spray into both nostrils 2 (two) times daily. For <7 days at a time.    Marland Kitchen  ranitidine (ZANTAC) 150 MG tablet Take 1 tablet (150 mg total) by mouth 2 (two) times daily. 60 tablet 0  . rizatriptan (MAXALT-MLT) 5 MG disintegrating tablet Take 2 tablets (10 mg total) by mouth as needed for migraine. May repeat in 2 hours if needed 10 tablet 0  . tiZANidine (ZANAFLEX) 4 MG capsule Take 1 capsule (4 mg total) by mouth 3 (three) times daily as needed for muscle spasms. 90 capsule 2  . Topiramate ER 100 MG CS24 Take 100 mg by mouth at bedtime. 90 each 1  . triamcinolone cream (KENALOG) 0.1 % Apply 1 application topically 2 (two) times daily. 454 g 0  . valACYclovir (VALTREX) 500 MG tablet TAKE 1 TABLET (500 MG TOTAL) BY MOUTH DAILY. 30 tablet 0  . diclofenac sodium (VOLTAREN) 1 % GEL Apply 2 g 4 (four) times daily topically. (Patient not taking: Reported on 08/04/2017) 100 g 1  . famotidine (PEPCID) 20 MG tablet Take 1 tablet (20 mg total) by mouth daily. (Patient not taking: Reported on 07/04/2017) 5 tablet 0  . lidocaine (LIDODERM) 5 % Place 1 patch onto the skin daily. Remove & Discard patch within 12 hours or as directed by MD (Patient not taking: Reported on 07/04/2017) 30 patch 0  . lubiprostone (AMITIZA) 8 MCG capsule Take 8 mcg by mouth 2 (two) times daily with a  meal.    . Norethindrone Acetate-Ethinyl Estradiol (LOESTRIN 1.5/30, 21,) 1.5-30 MG-MCG tablet Take 1 tablet by mouth daily. 90 Package 3  . traZODone (DESYREL) 100 MG tablet Take 1 tablet (100 mg total) by mouth at bedtime. Take 1-2 tablets for sleep 180 tablet 1   No current facility-administered medications for this visit.      Musculoskeletal: Strength & Muscle Tone: within normal limits Gait & Station: normal Patient leans: N/A  Psychiatric Specialty Exam: ROS  Blood pressure 108/74, pulse 78, height 5\' 4"  (1.626 m), weight 198 lb (89.8 kg), SpO2 97 %.Body mass index is 33.99 kg/m.  General Appearance: Casual and Fairly Groomed  Eye Contact:  Fair  Speech:  Clear and Coherent  Volume:  Normal  Mood:  Anxious and Dysphoric  Affect:  Appropriate and Congruent  Thought Process:  Goal Directed and Descriptions of Associations: Intact  Orientation:  Full (Time, Place, and Person)  Thought Content: Logical   Suicidal Thoughts:  No  Homicidal Thoughts:  No  Memory:  Immediate;   Fair  Judgement:  Fair  Insight:  Fair  Psychomotor Activity:  Normal  Concentration:  Concentration: Fair  Recall:  [employer] of Knowledge: Fair  Language: Good  Akathisia:  Negative  Handed:  Right  AIMS (if indicated): not done  Assets:  Communication Skills Desire for Improvement Financial Resources/Insurance Housing Social Support Transportation Vocational/Educational  ADL's:  Intact  Cognition: WNL  Sleep:  Fair   Screenings: GAD-7     Counselor from 02/28/2017 in [facility] for Infectious Disease Procedure visit from 10/28/2016 in [place] for [place] Procedure visit from 09/24/2016 in [place] for [place]  Total GAD-7 Score  12  1  0    GASS     Counselor from 03/14/2017 in [facility] for Infectious Disease  GASS Overall Total Score  13    MDI     Counselor from 02/28/2017 in [facility] for Infectious  Disease  Total Score (max 50)  34    PHQ2-9     Office Visit from 07/04/2017 in [place] [facility] Office Visit from 05/26/2017 in [place] Office Visit from 03/15/2017 in [place] Office Visit from 11/25/2016 in [place] Office Visit from 11/02/2016 in [place]  PHQ-2 Total Score  0  4  4  0  0  PHQ-9 Total Score  No data  No data  13  No data  No data    SBQ-R     Counselor from 03/14/2017 in [facility] for Infectious Disease  SBQ-R Total Score  13.1       Assessment and Plan:  [name] presents with some improvement in her depression and irritability with Latuda.  She continues to have trouble with sleeping at night, specifically maintaining sleep.  We discussed the risks and benefits of trazodone and agreed to initiate 100 mg nightly.  We also agreed to increase Latuda to 80 mg daily for a more robust response in terms of depression and irritability improvements.  She has not had any intolerance to [place].  We will follow-up in 3-4 months, and she will be reestablishing individual therapy.  1. Chronic post-traumatic stress disorder (PTSD)   2. Major depressive disorder, single episode, moderate ([place])   3. Mood disorder in conditions classified elsewhere   4. Bipolar mixed affective disorder, moderate (HCC)     Status of current problems: gradually improving  Labs Ordered: No orders of the defined types were placed in this encounter.   Labs Reviewed: n/a  Collateral Obtained/Records Reviewed: n/a  Plan:  Increase Latuda to 80 mg; okay to increase to 120 after 4-6 weeks if tolerated, and continuing to struggle with some breakthrough symptoms Initiate trazodone 100-200 mg nightly RTC 3 months  I spent 15 minutes with the patient in direct face-to-face clinical care.  Greater than 50% of this time was spent in counseling and coordination  of care with the patient.    Aundra Dubin, MD 08/04/2017, 3:54 PM

## 2017-08-04 NOTE — Patient Instructions (Signed)
Increase latuda to 80 mg daily  Trazodone 100 mg nightly - take 1/2 tablet or UP to 2 tablets nightly

## 2017-08-05 LAB — CYTOLOGY - PAP
Adequacy: ABSENT
Bacterial vaginitis: NEGATIVE
CHLAMYDIA, DNA PROBE: NEGATIVE
Diagnosis: NEGATIVE
NEISSERIA GONORRHEA: NEGATIVE
Trichomonas: NEGATIVE

## 2017-08-19 ENCOUNTER — Encounter: Payer: Self-pay | Admitting: Family Medicine

## 2017-08-19 ENCOUNTER — Ambulatory Visit: Payer: BLUE CROSS/BLUE SHIELD | Admitting: Family Medicine

## 2017-08-19 VITALS — BP 120/82 | HR 80 | Temp 98.7°F | Resp 16 | Ht 64.0 in | Wt 202.0 lb

## 2017-08-19 DIAGNOSIS — B009 Herpesviral infection, unspecified: Secondary | ICD-10-CM

## 2017-08-19 DIAGNOSIS — L309 Dermatitis, unspecified: Secondary | ICD-10-CM | POA: Diagnosis not present

## 2017-08-19 DIAGNOSIS — E785 Hyperlipidemia, unspecified: Secondary | ICD-10-CM

## 2017-08-19 DIAGNOSIS — Z131 Encounter for screening for diabetes mellitus: Secondary | ICD-10-CM | POA: Diagnosis not present

## 2017-08-19 DIAGNOSIS — B20 Human immunodeficiency virus [HIV] disease: Secondary | ICD-10-CM

## 2017-08-19 LAB — POCT URINALYSIS DIP (DEVICE)
BILIRUBIN URINE: NEGATIVE
Glucose, UA: NEGATIVE mg/dL
Hgb urine dipstick: NEGATIVE
Ketones, ur: NEGATIVE mg/dL
LEUKOCYTES UA: NEGATIVE
Nitrite: NEGATIVE
PH: 5.5 (ref 5.0–8.0)
Protein, ur: NEGATIVE mg/dL
SPECIFIC GRAVITY, URINE: 1.025 (ref 1.005–1.030)
UROBILINOGEN UA: 1 mg/dL (ref 0.0–1.0)

## 2017-08-19 LAB — POCT GLYCOSYLATED HEMOGLOBIN (HGB A1C): Hemoglobin A1C: 5.3

## 2017-08-19 MED ORDER — RANITIDINE HCL 150 MG PO TABS: 150.0000 mg | ORAL_TABLET | Freq: Two times a day (BID) | ORAL | 0 refills | Status: DC

## 2017-08-19 MED ORDER — TRIAMCINOLONE ACETONIDE 0.1 % EX CREA: 1.0000 "application " | TOPICAL_CREAM | Freq: Two times a day (BID) | CUTANEOUS | 1 refills | Status: DC

## 2017-08-19 MED ORDER — VALACYCLOVIR HCL 500 MG PO TABS: ORAL_TABLET | ORAL | 0 refills | Status: DC

## 2017-08-19 MED FILL — VALACYCLOVIR HCL 500 MG TAB: 500 | 30 days supply | Qty: 30 | Fill #0

## 2017-08-19 MED FILL — raNITIdine HCL 150 MG TABS: 150 | 30 days supply | Qty: 60 | Fill #0

## 2017-08-19 NOTE — Patient Instructions (Addendum)
Return in 2 weeks for fasting labs. Your last cholesterol panel was abnormally elevated and indicated that you should be prescribed Statin therapy.    Mediterranean Diet A Mediterranean diet refers to food and lifestyle choices that are based on the traditions of countries located on the [employer]. This way of eating has been shown to help prevent certain conditions and improve outcomes for people who have chronic diseases, like kidney disease and heart disease. What are tips for following this plan? Lifestyle  Cook and eat meals together with your family, when possible.  Drink enough fluid to keep your urine clear or pale yellow.  Be physically active every day. This includes: ? Aerobic exercise like running or swimming. ? Leisure activities like gardening, walking, or housework.  Get 7-8 hours of sleep each night.  If recommended by your health care provider, drink red wine in moderation. This means 1 glass a day for nonpregnant women and 2 glasses a day for men. A glass of wine equals 5 oz (150 mL). Reading food labels  Check the serving size of packaged foods. For foods such as rice and pasta, the serving size refers to the amount of cooked product, not dry.  Check the total fat in packaged foods. Avoid foods that have saturated fat or trans fats.  Check the ingredients list for added sugars, such as corn syrup. Shopping  At the grocery store, buy most of your food from the areas near the walls of the store. This includes: ? Fresh fruits and vegetables (produce). ? Grains, beans, nuts, and seeds. Some of these may be available in unpackaged forms or large amounts (in bulk). ? Fresh seafood. ? Poultry and eggs. ? Low-fat dairy products.  Buy whole ingredients instead of prepackaged foods.  Buy fresh fruits and vegetables in-season from local farmers markets.  Buy frozen fruits and vegetables in resealable bags.  If you do not have access to quality fresh seafood,  buy precooked frozen shrimp or canned fish, such as tuna, salmon, or sardines.  Buy small amounts of raw or cooked vegetables, salads, or olives from the deli or salad bar at your store.  Stock your pantry so you always have certain foods on hand, such as olive oil, canned tuna, canned tomatoes, rice, pasta, and beans. Cooking  Cook foods with extra-virgin olive oil instead of using butter or other vegetable oils.  Have meat as a side dish, and have vegetables or grains as your main dish. This means having meat in small portions or adding small amounts of meat to foods like pasta or stew.  Use beans or vegetables instead of meat in common dishes like chili or lasagna.  Experiment with different cooking methods. Try roasting or broiling vegetables instead of steaming or sauteing them.  Add frozen vegetables to soups, stews, pasta, or rice.  Add nuts or seeds for added healthy fat at each meal. You can add these to yogurt, salads, or vegetable dishes.  Marinate fish or vegetables using olive oil, lemon juice, garlic, and fresh herbs. Meal planning  Plan to eat 1 vegetarian meal one day each week. Try to work up to 2 vegetarian meals, if possible.  Eat seafood 2 or more times a week.  Have healthy snacks readily available, such as: ? Vegetable sticks with hummus. ? [place] yogurt. ? Fruit and nut trail mix.  Eat balanced meals throughout the week. This includes: ? Fruit: 2-3 servings a day ? Vegetables: 4-5 servings a day ? Low-fat dairy:  2 servings a day ? Fish, poultry, or lean meat: 1 serving a day ? Beans and legumes: 2 or more servings a week ? Nuts and seeds: 1-2 servings a day ? Whole grains: 6-8 servings a day ? Extra-virgin olive oil: 3-4 servings a day  Limit red meat and sweets to only a few servings a month What are my food choices?  Mediterranean diet ? Recommended ? Grains: Whole-grain pasta. Brown rice. Bulgar wheat. Polenta. Couscous. Whole-wheat bread.  [place]. ? Vegetables: Artichokes. Beets. Broccoli. Cabbage. Carrots. Eggplant. Green beans. Chard. Kale. Spinach. Onions. Leeks. Peas. Squash. Tomatoes. Peppers. Radishes. ? Fruits: Apples. Apricots. Avocado. Berries. Bananas. Cherries. Dates. Figs. Grapes. Lemons. Melon. Oranges. Peaches. Plums. Pomegranate. ? Meats and other protein foods: Beans. Almonds. Sunflower seeds. Pine nuts. Peanuts. [place]. Salmon. Scallops. Shrimp. [place]. Tilapia. Clams. Oysters. Eggs. ? Dairy: Low-fat milk. Cheese. Greek yogurt. ? Beverages: Water. Red wine. Herbal tea. ? Fats and oils: Extra virgin olive oil. Avocado oil. Grape seed oil. ? Sweets and desserts: [place] yogurt with honey. Baked apples. Poached pears. Trail mix. ? Seasoning and other foods: Basil. Cilantro. Coriander. Cumin. Mint. Parsley. Sage. Rosemary. Tarragon. Garlic. Oregano. Thyme. Pepper. Balsalmic vinegar. Tahini. Hummus. Tomato sauce. Olives. Mushrooms. ? Limit these ? Grains: Prepackaged pasta or rice dishes. Prepackaged cereal with added sugar. ? Vegetables: Deep fried potatoes (french fries). ? Fruits: Fruit canned in syrup. ? Meats and other protein foods: Beef. Pork. Lamb. Poultry with skin. Hot dogs. Berniece Salines. ? Dairy: Ice cream. Sour cream. Whole milk. ? Beverages: Juice. Sugar-sweetened soft drinks. Beer. Liquor and spirits. ? Fats and oils: Butter. Canola oil. Vegetable oil. Beef fat (tallow). Lard. ? Sweets and desserts: Cookies. Cakes. Pies. Candy. ? Seasoning and other foods: Mayonnaise. Premade sauces and marinades. ? The items listed may not be a complete list. Talk with your dietitian about what dietary choices are right for you. Summary  The Mediterranean diet includes both food and lifestyle choices.  Eat a variety of fresh fruits and vegetables, beans, nuts, seeds, and whole grains.  Limit the amount of red meat and sweets that you eat.  Talk with your health care provider about whether it is safe for you to drink  red wine in moderation. This means 1 glass a day for nonpregnant women and 2 glasses a day for men. A glass of wine equals 5 oz (150 mL). This information is not intended to replace advice given to you by your health care provider. Make sure you discuss any questions you have with your health care provider. Document Released: 03/25/2016 Document Revised: 04/27/2016 Document Reviewed: 03/25/2016 Elsevier Interactive Patient Education  2018 [place] Eating Plan DASH stands for "Dietary Approaches to Stop Hypertension." The DASH eating plan is a healthy eating plan that has been shown to reduce high blood pressure (hypertension). It may also reduce your risk for type 2 diabetes, heart disease, and stroke. The DASH eating plan may also help with weight loss. What are tips for following this plan? General guidelines  Avoid eating more than 2,300 mg (milligrams) of salt (sodium) a day. If you have hypertension, you may need to reduce your sodium intake to 1,500 mg a day.  Limit alcohol intake to no more than 1 drink a day for nonpregnant women and 2 drinks a day for men. One drink equals 12 oz of beer, 5 oz of wine, or 1 oz of hard liquor.  Work with your health care provider to maintain a healthy body  weight or to lose weight. Ask what an ideal weight is for you.  Get at least 30 minutes of exercise that causes your heart to beat faster (aerobic exercise) most days of the week. Activities may include walking, swimming, or biking.  Work with your health care provider or diet and nutrition specialist (dietitian) to adjust your eating plan to your individual calorie needs. Reading food labels  Check food labels for the amount of sodium per serving. Choose foods with less than 5 percent of the Daily Value of sodium. Generally, foods with less than 300 mg of sodium per serving fit into this eating plan.  To find whole grains, look for the word "whole" as the first word in the ingredient  list. Shopping  Buy products labeled as "low-sodium" or "no salt added."  Buy fresh foods. Avoid canned foods and premade or frozen meals. Cooking  Avoid adding salt when cooking. Use salt-free seasonings or herbs instead of table salt or sea salt. Check with your health care provider or pharmacist before using salt substitutes.  Do not fry foods. Cook foods using healthy methods such as baking, boiling, grilling, and broiling instead.  Cook with heart-healthy oils, such as olive, canola, soybean, or sunflower oil. Meal planning   Eat a balanced diet that includes: ? 5 or more servings of fruits and vegetables each day. At each meal, try to fill half of your plate with fruits and vegetables. ? Up to 6-8 servings of whole grains each day. ? Less than 6 oz of lean meat, poultry, or fish each day. A 3-oz serving of meat is about the same size as a deck of cards. One egg equals 1 oz. ? 2 servings of low-fat dairy each day. ? A serving of nuts, seeds, or beans 5 times each week. ? Heart-healthy fats. Healthy fats called Omega-3 fatty acids are found in foods such as flaxseeds and coldwater fish, like sardines, salmon, and mackerel.  Limit how much you eat of the following: ? Canned or prepackaged foods. ? Food that is high in trans fat, such as fried foods. ? Food that is high in saturated fat, such as fatty meat. ? Sweets, desserts, sugary drinks, and other foods with added sugar. ? Full-fat dairy products.  Do not salt foods before eating.  Try to eat at least 2 vegetarian meals each week.  Eat more home-cooked food and less restaurant, buffet, and fast food.  When eating at a restaurant, ask that your food be prepared with less salt or no salt, if possible. What foods are recommended? The items listed may not be a complete list. Talk with your dietitian about what dietary choices are best for you. Grains Whole-grain or whole-wheat bread. Whole-grain or whole-wheat pasta. Brown  rice. [place]. Bulgur. Whole-grain and low-sodium cereals. Pita bread. Low-fat, low-sodium crackers. Whole-wheat flour tortillas. Vegetables Fresh or frozen vegetables (raw, steamed, roasted, or grilled). Low-sodium or reduced-sodium tomato and vegetable juice. Low-sodium or reduced-sodium tomato sauce and tomato paste. Low-sodium or reduced-sodium canned vegetables. Fruits All fresh, dried, or frozen fruit. Canned fruit in natural juice (without added sugar). Meat and other protein foods Skinless chicken or [place]. Ground chicken or [place]. Pork with fat trimmed off. Fish and seafood. Egg whites. Dried beans, peas, or lentils. Unsalted nuts, nut butters, and seeds. Unsalted canned beans. Lean cuts of beef with fat trimmed off. Low-sodium, lean deli meat. Dairy Low-fat (1%) or fat-free (skim) milk. Fat-free, low-fat, or reduced-fat cheeses. Nonfat, low-sodium ricotta or cottage  cheese. Low-fat or nonfat yogurt. Low-fat, low-sodium cheese. Fats and oils Soft margarine without trans fats. Vegetable oil. Low-fat, reduced-fat, or light mayonnaise and salad dressings (reduced-sodium). Canola, safflower, olive, soybean, and sunflower oils. Avocado. Seasoning and other foods Herbs. Spices. Seasoning mixes without salt. Unsalted popcorn and pretzels. Fat-free sweets. What foods are not recommended? The items listed may not be a complete list. Talk with your dietitian about what dietary choices are best for you. Grains Baked goods made with fat, such as croissants, muffins, or some breads. Dry pasta or rice meal packs. Vegetables Creamed or fried vegetables. Vegetables in a cheese sauce. Regular canned vegetables (not low-sodium or reduced-sodium). Regular canned tomato sauce and paste (not low-sodium or reduced-sodium). Regular tomato and vegetable juice (not low-sodium or reduced-sodium). Angie Fava. Olives. Fruits Canned fruit in a light or heavy syrup. Fried fruit. Fruit in cream or butter  sauce. Meat and other protein foods Fatty cuts of meat. Ribs. Fried meat. [place] Salines. Sausage. Bologna and other processed lunch meats. Salami. Fatback. Hotdogs. Bratwurst. Salted nuts and seeds. Canned beans with added salt. Canned or smoked fish. Whole eggs or egg yolks. Chicken or [place] with skin. Dairy Whole or 2% milk, cream, and half-and-half. Whole or full-fat cream cheese. Whole-fat or sweetened yogurt. Full-fat cheese. Nondairy creamers. Whipped toppings. Processed cheese and cheese spreads. Fats and oils Butter. Stick margarine. Lard. Shortening. Ghee. Bacon fat. Tropical oils, such as coconut, palm kernel, or palm oil. Seasoning and other foods Salted popcorn and pretzels. Onion salt, garlic salt, seasoned salt, table salt, and sea salt. Worcestershire sauce. Tartar sauce. Barbecue sauce. Teriyaki sauce. Soy sauce, including reduced-sodium. Steak sauce. Canned and packaged gravies. Fish sauce. Oyster sauce. Cocktail sauce. Horseradish that you find on the shelf. Ketchup. Mustard. Meat flavorings and tenderizers. Bouillon cubes. Hot sauce and Tabasco sauce. Premade or packaged marinades. Premade or packaged taco seasonings. Relishes. Regular salad dressings. Where to find more information:  National Heart, Lung, and [place]: https://wilson-eaton.com/  American Heart Association: www.heart.org Summary  The DASH eating plan is a healthy eating plan that has been shown to reduce high blood pressure (hypertension). It may also reduce your risk for type 2 diabetes, heart disease, and stroke.  With the DASH eating plan, you should limit salt (sodium) intake to 2,300 mg a day. If you have hypertension, you may need to reduce your sodium intake to 1,500 mg a day.  When on the DASH eating plan, aim to eat more fresh fruits and vegetables, whole grains, lean proteins, low-fat dairy, and heart-healthy fats.  Work with your health care provider or diet and nutrition specialist (dietitian) to adjust  your eating plan to your individual calorie needs. This information is not intended to replace advice given to you by your health care provider. Make sure you discuss any questions you have with your health care provider. Document Released: 07/22/2011 Document Revised: 07/26/2016 Document Reviewed: 07/26/2016 Elsevier Interactive Patient Education  2018 [employer].      Exercising to [employer] Exercising can help you to lose weight. In order to lose weight through exercise, you need to do vigorous-intensity exercise. You can tell that you are exercising with vigorous intensity if you are breathing very hard and fast and cannot hold a conversation while exercising. Moderate-intensity exercise helps to maintain your current weight. You can tell that you are exercising at a moderate level if you have a higher heart rate and faster breathing, but you are still able to hold a conversation. How often should  I exercise? Choose an activity that you enjoy and set realistic goals. Your health care provider can help you to make an activity plan that works for you. Exercise regularly as directed by your health care provider. This may include:  Doing resistance training twice each week, such as: ? Push-ups. ? Sit-ups. ? Lifting weights. ? Using resistance bands.  Doing a given intensity of exercise for a given amount of time. Choose from these options: ? 150 minutes of moderate-intensity exercise every week. ? 75 minutes of vigorous-intensity exercise every week. ? A mix of moderate-intensity and vigorous-intensity exercise every week.  Children, pregnant women, people who are out of shape, people who are overweight, and older adults may need to consult a health care provider for individual recommendations. If you have any sort of medical condition, be sure to consult your health care provider before starting a new exercise program. What are some activities that can help me to lose  weight?  Walking at a rate of at least 4.5 miles an hour.  Jogging or running at a rate of 5 miles per hour.  Biking at a rate of at least 10 miles per hour.  Lap swimming.  Roller-skating or in-line skating.  Cross-country skiing.  Vigorous competitive sports, such as football, basketball, and soccer.  Jumping rope.  Aerobic dancing. How can I be more active in my day-to-day activities?  Use the stairs instead of the elevator.  Take a walk during your lunch break.  If you drive, park your car farther away from work or school.  If you take public transportation, get off one stop early and walk the rest of the way.  Make all of your phone calls while standing up and walking around.  Get up, stretch, and walk around every 30 minutes throughout the day. What guidelines should I follow while exercising?  Do not exercise so much that you hurt yourself, feel dizzy, or get very short of breath.  Consult your health care provider prior to starting a new exercise program.  Wear comfortable clothes and shoes with good support.  Drink plenty of water while you exercise to prevent dehydration or heat stroke. Body water is lost during exercise and must be replaced.  Work out until you breathe faster and your heart beats faster. This information is not intended to replace advice given to you by your health care provider. Make sure you discuss any questions you have with your health care provider. Document Released: 09/04/2010 Document Revised: 01/08/2016 Document Reviewed: 01/03/2014 Elsevier Interactive Patient Education  [employer].

## 2017-08-19 NOTE — Progress Notes (Signed)
Patient ID: [name], female    DOB: [birth year], 37 y.o.   MRN: [id number]  PCP: Scot Jun, FNP  Chief Complaint  Patient presents with  . Medication Refill    Subjective:  HPI [name] is a 37 y.o. female  With HIV, Bipolar disorder, GERD, Asthma presents requesting medication refills. Bryce is followed by [facility] for management of Bipolar. She is requesting refills of zantac for GERD treatment, triamcinolone for chronic dermatitis, and Valtrex for HSV management. She is requesting a referral to dermatology for evaluation of persistent dermatitis. This problem initially occurred last summer and has since worsening and expanded to more areas of skin. The rash has small bumps and is pruritic. Triamicinolone  cream improves itching, however rash never completely resolves. Denies any new introduction of cosmetic products or laundry detergent. She has no other complaints today.  Social History   Socioeconomic History  . Marital status: Single    Spouse name: Not on file  . Number of children: 1  . Years of education: 63  . Highest education level: Not on file  Social Needs  . Financial resource strain: Not on file  . Food insecurity - worry: Not on file  . Food insecurity - inability: Not on file  . Transportation needs - medical: Not on file  . Transportation needs - non-medical: Not on file  Occupational History  . Not on file  Tobacco Use  . Smoking status: Never Smoker  . Smokeless tobacco: Never Used  Substance and Sexual Activity  . Alcohol use: No    Alcohol/week: 0.0 oz    Comment: OCC.about once a month   . Drug use: No  . Sexual activity: No    Partners: Male    Birth control/protection: Implant  Other Topics Concern  . Not on file  Social History Narrative   Patient lives alone.   Patient works at home.   Patient has hs education.   Patient has 1 child.   Patient drinks caffeine once in a while.    Family  History  Problem Relation Age of Onset  . Arthritis/Rheumatoid Mother   . Hypertension Mother   . Cancer Maternal Aunt        brand and lung  . Cancer Maternal Grandmother        breasts  . Cancer Paternal Grandmother        breast and kidney   Review of Systems  Constitutional: Negative for fever, chills, diaphoresis, activity change, appetite change and fatigue. HENT: Negative for ear pain, nosebleeds, congestion, facial swelling, rhinorrhea, neck pain, neck stiffness and ear discharge.  Eyes: Negative for pain, discharge, redness, itching and visual disturbance. Respiratory: Negative for cough, choking, chest tightness, shortness of breath, wheezing and stridor.  Cardiovascular: Negative for chest pain, palpitations and leg swelling. Gastrointestinal: Negative for abdominal distention. Genitourinary: Negative for dysuria, urgency, frequency, hematuria, flank pain, decreased urine volume, difficulty urinating and dyspareunia.  Musculoskeletal: Negative for back pain, joint swelling, arthralgia and gait problem. Neurological: Negative for dizziness, tremors, seizures, syncope, facial asymmetry, speech difficulty, weakness, light-headedness, numbness and headaches.  Hematological: Negative for adenopathy. Does not bruise/bleed easily. Psychiatric/Behavioral: Negative for hallucinations, behavioral problems, confusion, dysphoric mood, decreased concentration and agitation.  Skin: Positive for  itching, dry, rash Patient Active Problem List   Diagnosis Date Noted  . Bipolar mixed affective disorder, moderate ([place]) 03/20/2017  . Chronic tension headaches 01/23/2017  . Acute upper respiratory infection 06/21/2016  .  Hyperlipidemia 11/17/2015  . Sinusitis 06/30/2015  . Hernia, abdominal 08/05/2014  . Vaginitis and vulvovaginitis 06/19/2014  . 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 Lung abscess ([place]) 06/26/2013  . Superficial Liver masses, right lobe; suspected  dermoid recurrence.   05/07/2013  . Ovarian cystic mass 05/07/2013  . s/p Serosal repair of colon 12/04/2012 12/15/2012  . Herpes genitalis in women 12/07/2012  . Anxiety state, unspecified 12/07/2012  . GERD (gastroesophageal reflux disease) 12/07/2012  . Pelvic mass in female s/p Robotic-assisted right salpingo-oophorectomy 12/04/2012  . SHINGLES 01/31/2009  . HEMORRHOIDS NOS, W/O COMPLICATIONS 70/08/7492  . Depression 11/25/2006  . Allergic rhinitis 11/25/2006  . 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 Human immunodeficiency virus (HIV) disease ([place]) 11/10/2006    Allergies  Allergen Reactions  . Seroquel [Quetiapine Fumarate] Swelling    Tongue swells   . Benzoin Other (See Comments)    blisters  . Latex Rash and Other (See Comments)  . Sulfamethoxazole-Trimethoprim Itching and Nausea And Vomiting    Prior to Admission medications   Medication Sig Start Date End Date Taking? Authorizing Provider  albuterol (PROAIR HFA) 108 (90 Base) MCG/ACT inhaler INHALE TWO PUFFS INTO THE LUNGS EVERY 6 HOURS AS NEEDED FOR WHEEZING. 07/04/17  Yes Campbell Riches, MD  beclomethasone (QVAR) 40 MCG/ACT inhaler INHALE TWO PUFFS INTO THE LUNGS TWO TIMES DAILY. 05/31/17  Yes Campbell Riches, MD  bictegravir-emtricitabine-tenofovir AF (BIKTARVY) 50-200-25 MG TABS tablet Take 1 tablet by mouth daily. 03/07/17  Yes Kuppelweiser, Cassie L, RPH-CPP  cetirizine (ZYRTEC) 10 MG tablet Take 2 tablets (20 mg total) by mouth at bedtime. 04/19/17  Yes Scot Jun, FNP  DENTA 5000 PLUS 1.1 % CREA dental cream USE SMALL AMOUNT ON TEETH AND GUMS EVERY EVENING SPIT OUT EXCESS AND DO NOT RINSE 03/03/17  Yes [provider]  fexofenadine (ALLEGRA ALLERGY) 180 MG tablet Take 1 tablet (180 mg total) by mouth daily. 04/28/16  Yes Micheline Chapman, NP  lubiprostone (AMITIZA) 8 MCG capsule Take 8 mcg by mouth 2 (two) times daily with a meal.   Yes [provider]  lurasidone (LATUDA) 80 MG TABS  tablet Take 1 tablet (80 mg total) by mouth daily with breakfast. 08/04/17  Yes Eksir, Richard Miu, MD  meloxicam (MOBIC) 15 MG tablet Take 1 tablet (15 mg total) by mouth daily. 06/17/17  Yes Scot Jun, FNP  montelukast (SINGULAIR) 10 MG tablet Take 1 tablet (10 mg total) at bedtime by mouth. 07/04/17  Yes Campbell Riches, MD  oxymetazoline (AFRIN) 0.05 % nasal spray Place 1 spray into both nostrils 2 (two) times daily. For <7 days at a time.   Yes [provider]  ranitidine (ZANTAC) 150 MG tablet Take 1 tablet (150 mg total) by mouth 2 (two) times daily. 04/19/17  Yes Scot Jun, FNP  rizatriptan (MAXALT-MLT) 5 MG disintegrating tablet Take 2 tablets (10 mg total) by mouth as needed for migraine. May repeat in 2 hours if needed 01/20/17  Yes Scot Jun, FNP  tiZANidine (ZANAFLEX) 4 MG capsule Take 1 capsule (4 mg total) by mouth 3 (three) times daily as needed for muscle spasms. 01/20/17  Yes Scot Jun, FNP  Topiramate ER 100 MG CS24 Take 100 mg by mouth at bedtime. 01/20/17  Yes Scot Jun, FNP  traZODone (DESYREL) 100 MG tablet Take 1 tablet (100 mg total) by mouth at bedtime. Take 1-2 tablets for sleep 08/04/17  Yes Eksir, Richard Miu, MD  triamcinolone cream (KENALOG) 0.1 % Apply 1 application topically 2 (two) times daily. 06/17/17  Yes Scot Jun, FNP  valACYclovir (VALTREX) 500 MG tablet TAKE 1 TABLET (500 MG TOTAL) BY MOUTH DAILY. 07/01/17  Yes Campbell Riches, MD  diclofenac sodium (VOLTAREN) 1 % GEL Apply 2 g 4 (four) times daily topically. Patient not taking: Reported on 08/19/2017 06/30/17   Scot Jun, FNP  JUNEL 1.5/30 1.5-30 MG-MCG tablet Take 1 tablet by mouth daily. 07/28/17   [provider]  lidocaine (LIDODERM) 5 % Place 1 patch onto the skin daily. Remove & Discard patch within 12 hours or as directed by MD Patient not taking: Reported on 07/04/2017 06/17/17   Scot Jun, FNP  Norethindrone  Acetate-Ethinyl Estradiol (LOESTRIN 1.5/30, 21,) 1.5-30 MG-MCG tablet Take 1 tablet by mouth daily. 04/01/17 06/30/17  Constant, Peggy, MD    Past Medical, Surgical Family and Social History reviewed and updated.    Objective:   Today's Vitals   08/19/17 1355  BP: 120/82  Pulse: 80  Resp: 16  Temp: 98.7 F (37.1 C)  TempSrc: Oral  SpO2: 99%  Weight: 202 lb (91.6 kg)  Height: 5\' 4"  (1.626 m)    Wt Readings from Last 3 Encounters:  08/19/17 202 lb (91.6 kg)  07/04/17 190 lb (86.2 kg)  06/17/17 195 lb (88.5 kg)   Physical Exam Physical Exam: Constitutional: Patient appears well-developed and well-nourished. No distress. HENT: Normocephalic, atraumatic, External right and left ear normal. Oropharynx is clear and moist.  Eyes: Conjunctivae and EOM are normal. PERRLA, no scleral icterus. Neck: Normal ROM. Neck supple. No JVD. No tracheal deviation. No thyromegaly. CVS: RRR, S1/S2 +, no murmurs, no gallops, no carotid bruit.  Pulmonary: Effort and breath sounds normal, no stridor, rhonchi, wheezes, rales.  Abdominal: Soft. BS +, no distension, tenderness, rebound or guarding.  Musculoskeletal: Normal range of motion. No edema and no tenderness.  Lymphadenopathy: No lymphadenopathy noted, cervical, inguinal or axillary Neuro: Alert. Normal reflexes, muscle tone coordination. No cranial nerve deficit. Skin: Skin is warm and dry. Maculopapular rash present extremities. Mild erythremia.  Not diaphoretic. Psychiatric: Normal mood and affect. Behavior, judgment, thought content normal.   Assessment & Plan:  1. Dermatitis, worsening and reoccurring.  Patient has obtained mild relief with steroidal topical treatment. Referring to dermatology for further evaluation and treatment. 2. HIV disease ([place]), Stable, managed by Infectious diease provider, Dr. Johnnye Sima. 3. Herpes, stable. Continue Valtrex during acute exacerbations  4. Dyslipidemia, last lipid panel 2018, indicated total cholesterol  and LDL significantly elevated within the range for consideration of statin therapy. Return in 2 weeks for fasting lipid and CMP  Encouraged DASH diet and engaging in physical activity with a goal of 150 minutes per week. 5. Screening for diabetes mellitus- (Hb A1C)5.3, normal, recheck in 12 months.  Meds ordered this encounter  Medications  . triamcinolone cream (KENALOG) 0.1 %    Sig: Apply 1 application topically 2 (two) times daily.    Dispense:  454 g    Refill:  1    Order Specific Question:   Supervising Provider    Answer:   Tresa Garter [id number]  . valACYclovir (VALTREX) 500 MG tablet    Sig: TAKE 1 TABLET (500 MG TOTAL) BY MOUTH DAILY.    Dispense:  30 tablet    Refill:  0    Order Specific Question:   Supervising Provider    Answer:   Tresa Garter [id number]  . ranitidine (ZANTAC)  150 MG tablet    Sig: Take 1 tablet (150 mg total) by mouth 2 (two) times daily.    Dispense:  60 tablet    Refill:  0    Order Specific Question:   Supervising Provider    Answer:   Tresa Garter [[id number]]    A total of 25  minutes spent, greater than 50 % of this time was spent reviewing prior medical history, reviewing medications and indications of treatment, prior labs and diagnostic tests, discussing current plan of treatment, health promotion, and goals of treatment.   RTC: 2 weeks for fasting labs and 6 months chronic conditions   Carroll Sage. Kenton Kingfisher, MSN, FNP-C [facility]  [address] C[address] [place], [place] [address] [phone]

## 2017-08-22 ENCOUNTER — Encounter: Payer: Self-pay | Admitting: Infectious Diseases

## 2017-08-23 ENCOUNTER — Emergency Department (HOSPITAL_COMMUNITY): Payer: BLUE CROSS/BLUE SHIELD

## 2017-08-23 ENCOUNTER — Encounter (HOSPITAL_COMMUNITY): Payer: Self-pay | Admitting: *Deleted

## 2017-08-23 ENCOUNTER — Emergency Department (HOSPITAL_COMMUNITY)
Admission: EM | Admit: 2017-08-23 | Discharge: 2017-08-23 | Disposition: A | Discharge status: Home / Self Care | Payer: BLUE CROSS/BLUE SHIELD | Attending: Emergency Medicine | Admitting: Emergency Medicine

## 2017-08-23 DIAGNOSIS — S161XXA Strain of muscle, fascia and tendon at neck level, initial encounter: Secondary | ICD-10-CM | POA: Insufficient documentation

## 2017-08-23 DIAGNOSIS — Z79899 Other long term (current) drug therapy: Secondary | ICD-10-CM | POA: Diagnosis not present

## 2017-08-23 DIAGNOSIS — Y939 Activity, unspecified: Secondary | ICD-10-CM | POA: Insufficient documentation

## 2017-08-23 DIAGNOSIS — M542 Cervicalgia: Secondary | ICD-10-CM

## 2017-08-23 DIAGNOSIS — Z91040 Latex allergy status: Secondary | ICD-10-CM | POA: Diagnosis not present

## 2017-08-23 DIAGNOSIS — R51 Headache: Secondary | ICD-10-CM | POA: Diagnosis not present

## 2017-08-23 DIAGNOSIS — M5489 Other dorsalgia: Secondary | ICD-10-CM | POA: Diagnosis not present

## 2017-08-23 DIAGNOSIS — Y929 Unspecified place or not applicable: Secondary | ICD-10-CM | POA: Insufficient documentation

## 2017-08-23 DIAGNOSIS — M545 Low back pain, unspecified: Secondary | ICD-10-CM

## 2017-08-23 DIAGNOSIS — E119 Type 2 diabetes mellitus without complications: Secondary | ICD-10-CM | POA: Insufficient documentation

## 2017-08-23 DIAGNOSIS — T148XXA Other injury of unspecified body region, initial encounter: Secondary | ICD-10-CM

## 2017-08-23 DIAGNOSIS — Y999 Unspecified external cause status: Secondary | ICD-10-CM | POA: Diagnosis not present

## 2017-08-23 DIAGNOSIS — S199XXA Unspecified injury of neck, initial encounter: Secondary | ICD-10-CM | POA: Diagnosis not present

## 2017-08-23 DIAGNOSIS — J45909 Unspecified asthma, uncomplicated: Secondary | ICD-10-CM | POA: Insufficient documentation

## 2017-08-23 MED ORDER — IBUPROFEN 400 MG PO TABS
600.0000 mg | ORAL_TABLET | Freq: Once | ORAL | Status: AC
  Administered 2017-08-23: 600 mg via ORAL
  Filled 2017-08-23: qty 1

## 2017-08-23 NOTE — ED Notes (Signed)
ED Provider at bedside. 

## 2017-08-23 NOTE — Discharge Instructions (Addendum)
The pain your experiencing is likely due to muscle strain, you may take NSAIDs and your home muscle relaxer as needed for pain management. Do not combine with any pain reliever other than tylenol. The muscle soreness should improve over the next week. Follow up with your family doctor in the next week for a recheck if you are still having symptoms. Return to ED if pain is worsening, you develop weakness or numbness of extremities, or new or concerning symptoms develop.
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
## 2017-08-26 ENCOUNTER — Other Ambulatory Visit: Payer: Self-pay | Admitting: Infectious Diseases

## 2017-08-26 DIAGNOSIS — B20 Human immunodeficiency virus [HIV] disease: Secondary | ICD-10-CM

## 2017-08-30 ENCOUNTER — Telehealth: Payer: Self-pay | Admitting: *Deleted

## 2017-08-30 ENCOUNTER — Ambulatory Visit (INDEPENDENT_AMBULATORY_CARE_PROVIDER_SITE_OTHER): Payer: BLUE CROSS/BLUE SHIELD | Admitting: Infectious Diseases

## 2017-08-30 ENCOUNTER — Ambulatory Visit
Admission: RE | Admit: 2017-08-30 | Discharge: 2017-08-30 | Disposition: A | Discharge status: Home / Self Care | Payer: BLUE CROSS/BLUE SHIELD | Source: Ambulatory Visit | Attending: Infectious Diseases | Admitting: Infectious Diseases

## 2017-08-30 DIAGNOSIS — J45909 Unspecified asthma, uncomplicated: Secondary | ICD-10-CM | POA: Insufficient documentation

## 2017-08-30 DIAGNOSIS — R05 Cough: Secondary | ICD-10-CM | POA: Diagnosis not present

## 2017-08-30 DIAGNOSIS — R059 Cough, unspecified: Secondary | ICD-10-CM

## 2017-08-30 DIAGNOSIS — J45901 Unspecified asthma with (acute) exacerbation: Secondary | ICD-10-CM

## 2017-08-30 MED ORDER — PREDNISONE 20 MG PO TABS: 40.0000 mg | ORAL_TABLET | Freq: Every day | ORAL | 0 refills | Status: AC

## 2017-08-30 MED ORDER — ALBUTEROL SULFATE (2.5 MG/3ML) 0.083% IN NEBU
2.5000 mg | INHALATION_SOLUTION | Freq: Once | RESPIRATORY_TRACT | Status: AC
  Administered 2017-08-30: 2.5 mg via RESPIRATORY_TRACT

## 2017-08-30 MED ORDER — ALBUTEROL SULFATE (2.5 MG/3ML) 0.083% IN NEBU: 2.5000 mg | INHALATION_SOLUTION | Freq: Four times a day (QID) | RESPIRATORY_TRACT | 3 refills | Status: DC | PRN

## 2017-08-30 NOTE — Patient Instructions (Addendum)
You have a medication called prednisone waiting for you at the pharmacy. Please pick this up and take 2 pills once a day for 5 days.   Albuterol nebulizer solution every 4 - 6 hours as needed for the next 2 days.   Chest x-ray today. If this looks like you have pneumonia I will send in an antibiotic for you.   Please pick up the prednisone and take your first dose today as soon as possible.   Continue your other asthma medications and plan on resuming your hand held MDI albuterol inhaler after improvement with nebulizer and steroids.   If you continue to have ongoing shortness of breath you should go to either an urgent care or emergency room (if you have severe shortness of breath)     Asthma, Acute Bronchospasm Acute bronchospasm caused by asthma is also referred to as an asthma attack. Bronchospasm means your air passages become narrowed. The narrowing is caused by inflammation and tightening of the muscles in the air tubes (bronchi) in your lungs. This can make it hard to breathe or cause you to wheeze and cough. What are the causes? Possible triggers are:  Animal dander from the skin, hair, or feathers of animals.  Dust mites contained in house dust.  Cockroaches.  Pollen from trees or grass.  Mold.  Cigarette or tobacco smoke.  Air pollutants such as dust, household cleaners, hair sprays, aerosol sprays, paint fumes, strong chemicals, or strong odors.  Cold air or weather changes. Cold air may trigger inflammation. Winds increase molds and pollens in the air.  Strong emotions such as crying or laughing hard.  Stress.  Certain medicines such as aspirin or beta-blockers.  Sulfites in foods and drinks, such as dried fruits and wine.  Infections or inflammatory conditions, such as a flu, cold, or inflammation of the nasal membranes (rhinitis).  Gastroesophageal reflux disease (GERD). GERD is a condition where stomach acid backs up into your esophagus.  Exercise or  strenuous activity.  What are the signs or symptoms?  Wheezing.  Excessive coughing, particularly at night.  Chest tightness.  Shortness of breath. How is this diagnosed? Your health care provider will ask you about your medical history and perform a physical exam. A chest X-ray or blood testing may be performed to look for other causes of your symptoms or other conditions that may have triggered your asthma attack. How is this treated? Treatment is aimed at reducing inflammation and opening up the airways in your lungs. Most asthma attacks are treated with inhaled medicines. These include quick relief or rescue medicines (such as bronchodilators) and controller medicines (such as inhaled corticosteroids). These medicines are sometimes given through an inhaler or a nebulizer. Systemic steroid medicine taken by mouth or given through an IV tube also can be used to reduce the inflammation when an attack is moderate or severe. Antibiotic medicines are only used if a bacterial infection is present. Follow these instructions at home:  Rest.  Drink plenty of liquids. This helps the mucus to remain thin and be easily coughed up. Only use caffeine in moderation and do not use alcohol until you have recovered from your illness.  Do not smoke. Avoid being exposed to secondhand smoke.  You play a critical role in keeping yourself in good health. Avoid exposure to things that cause you to wheeze or to have breathing problems.  Keep your medicines up-to-date and available. Carefully follow your health care provider's treatment plan.  Take your medicine exactly as  prescribed.  When pollen or pollution is bad, keep windows closed and use an air conditioner or go to places with air conditioning.  Asthma requires careful medical care. See your health care provider for a follow-up as advised. If you are more than [redacted] weeks pregnant and you were prescribed any new medicines, let your obstetrician know  about the visit and how you are doing. Follow up with your health care provider as directed.  After you have recovered from your asthma attack, make an appointment with your outpatient doctor to talk about ways to reduce the likelihood of future attacks. If you do not have a doctor who manages your asthma, make an appointment with a primary care doctor to discuss your asthma. Get help right away if:  You are getting worse.  You have trouble breathing. If severe, call your local emergency services (911 in the [place]  You develop chest pain or discomfort.  You are vomiting.  You are not able to keep fluids down.  You are coughing up yellow, green, brown, or bloody sputum.  You have a fever and your symptoms suddenly get worse.  You have trouble swallowing. This information is not intended to replace advice given to you by your health care provider. Make sure you discuss any questions you have with your health care provider. Document Released: 11/17/2006 Document Revised: 01/14/2016 Document Reviewed: 02/07/2013 Elsevier Interactive Patient Education  2017 [employer].

## 2017-08-30 NOTE — Telephone Encounter (Signed)
Asthma symptoms x 6 days, wheezing, shortness of breath, productive cough, rhinorrhea, chills, fever, diaphoresis, sleeping poorly propped up in the bed.  Unable to see her PCP until the middle of next week.  Requesting appointment.  RN spoke with S. Doren Custard, NP who agreed to see the patient as a "walk-in."

## 2017-08-30 NOTE — Progress Notes (Signed)
[name]  December 08, 1980 [id number] Scot Jun, FNP   Reason for Visit: shortness of breath, asthma flare   Patient Active Problem List   Diagnosis Date Noted  . Cough 08/30/2017  . Asthma exacerbation 08/30/2017  . Bipolar mixed affective disorder, moderate ([place]) 03/20/2017  . Chronic tension headaches 01/23/2017  . Acute upper respiratory infection 06/21/2016  . Hyperlipidemia 11/17/2015  . Sinusitis 06/30/2015  . Hernia, abdominal 08/05/2014  . Vaginitis and vulvovaginitis 06/19/2014  . 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 Lung abscess ([place]) 06/26/2013  . 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   05/07/2013  . Ovarian cystic mass 05/07/2013  . s/p Serosal repair of colon 12/04/2012 12/15/2012  . Herpes genitalis in women 12/07/2012  . Anxiety state, unspecified 12/07/2012  . GERD (gastroesophageal reflux disease) 12/07/2012  . Pelvic mass in female s/p Robotic-assisted right salpingo-oophorectomy 12/04/2012  . SHINGLES 01/31/2009  . HEMORRHOIDS NOS, W/O COMPLICATIONS 03/50/0938  . Depression 11/25/2006  . Allergic rhinitis 11/25/2006  . 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 Human immunodeficiency virus (HIV) disease ([place]) 11/10/2006    Patient's Medications  New Prescriptions   ALBUTEROL (PROVENTIL) (2.5 MG/3ML) 0.083% NEBULIZER SOLUTION    Take 3 mLs (2.5 mg total) by nebulization every 6 (six) hours as needed for wheezing or shortness of breath (If no relief from MDI inhaler).   PREDNISONE (DELTASONE) 20 MG TABLET    Take 2 tablets (40 mg total) by mouth daily with breakfast for 5 days.  Previous Medications   ALBUTEROL (PROAIR HFA) 108 (90 BASE) MCG/ACT INHALER    INHALE TWO PUFFS INTO THE LUNGS EVERY 6 HOURS AS NEEDED FOR WHEEZING.   BECLOMETHASONE (QVAR) 40 MCG/ACT INHALER    INHALE TWO PUFFS INTO THE LUNGS TWO TIMES DAILY.   BIKTARVY 50-200-25 MG TABS TABLET    TAKE ONE TABLET BY MOUTH ONCE DAILY   CETIRIZINE (ZYRTEC) 10 MG TABLET    Take 2 tablets (20 mg total) by mouth at bedtime.   DENTA 5000 PLUS 1.1 % CREA DENTAL CREAM    USE SMALL AMOUNT ON TEETH AND GUMS EVERY EVENING SPIT OUT EXCESS AND DO NOT RINSE   DICLOFENAC SODIUM (VOLTAREN) 1 % GEL    Apply 2 g 4 (four) times daily topically.   FEXOFENADINE (ALLEGRA ALLERGY) 180 MG TABLET    Take 1 tablet (180 mg total) by mouth daily.   JUNEL 1.5/30 1.5-30 MG-MCG TABLET    Take 1 tablet by mouth daily.   LIDOCAINE (LIDODERM) 5 %    Place 1 patch onto the skin daily. Remove & Discard patch within 12 hours or as directed by MD   LUBIPROSTONE (AMITIZA) 8 MCG CAPSULE    Take 8 mcg by mouth 2 (two) times daily with a meal.   LURASIDONE (LATUDA) 80 MG TABS TABLET    Take 1 tablet (80 mg total) by mouth daily with breakfast.   MELOXICAM (MOBIC) 15 MG TABLET    Take 1 tablet (15 mg total) by mouth daily.   MONTELUKAST (SINGULAIR) 10 MG TABLET    Take 1 tablet (10 mg total) at bedtime by mouth.   NORETHINDRONE ACETATE-ETHINYL ESTRADIOL (LOESTRIN 1.5/30, 21,) 1.5-30 MG-MCG TABLET    Take 1 tablet by mouth daily.   OXYMETAZOLINE (AFRIN) 0.05 % NASAL SPRAY    Place 1 spray into both nostrils 2 (two) times daily. For <7 days at a time.   RANITIDINE (ZANTAC) 150 MG TABLET    Take 1  tablet (150 mg total) by mouth 2 (two) times daily.   RIZATRIPTAN (MAXALT-MLT) 5 MG DISINTEGRATING TABLET    Take 2 tablets (10 mg total) by mouth as needed for migraine. May repeat in 2 hours if needed   TIZANIDINE (ZANAFLEX) 4 MG CAPSULE    Take 1 capsule (4 mg total) by mouth 3 (three) times daily as needed for muscle spasms.   TOPIRAMATE ER 100 MG CS24    Take 100 mg by mouth at bedtime.   TRAZODONE (DESYREL) 100 MG TABLET    Take 1 tablet (100 mg total) by mouth at bedtime. Take 1-2 tablets for sleep   TRIAMCINOLONE CREAM (KENALOG) 0.1 %    Apply 1 application topically 2 (two) times daily.   VALACYCLOVIR (VALTREX) 500 MG TABLET    TAKE 1 TABLET (500 MG TOTAL) BY MOUTH DAILY.   Modified Medications   No medications on file  Discontinued Medications   No medications on file    HPI:  [name] is here as a walk in today after calling triage line to report shortness of breath unrelieved by her routine asthma medications after she was unable to get an appointment with her PCP. She was in her usual state of health taking all her prescribed asthma medications until about 6 days ago when she began experiencing some subjective fevers/chills. She has since developed a cough and shortness of breath/wheezing unrelieved by her rescue inhaler. She has used this 10 - 12 times today alone and feels no effect from the medication. She denies ear pain, nasal congestion, sputum production (cough is dry and hacking), chest pain. She does report some associated headaches, dizziness, ear fullness and facial pain. She worked a full shift today but is very tired and requesting consideration for a work note to cover her for tomorrow. She is very concerned she has pneumonia as she has had this in the past.   Of note she has had very well controlled HIV and immune reconstitution and continues on her HIV mediations daily without lapse.   HIV 1 RNA Quant (copies/mL)  Date Value  04/19/2017 21 (H)  04/06/2017 <20 DETECTED (A)  01/11/2017 27 (H)   CD4 T Cell Abs (/uL)  Date Value  04/19/2017 1,130  04/06/2017 970  01/11/2017 1,510    Review of Systems: Review of Systems  Constitutional: Positive for chills, diaphoresis and malaise/fatigue. Negative for fever.  HENT: Positive for sinus pain. Negative for congestion, ear pain and sore throat.   Eyes: Negative for blurred vision and photophobia.  Respiratory: Positive for cough, shortness of breath and wheezing. Negative for sputum production.   Cardiovascular: Negative for chest pain and leg swelling.  Gastrointestinal: Negative for diarrhea, nausea and vomiting.  Genitourinary: Negative for dysuria.  Musculoskeletal: Negative for back  pain.  Skin: Negative for rash.  Neurological: Positive for headaches.    Past Medical History:  Diagnosis Date  . Allergy   . Anemia   . Anxiety   . Anxiety state, unspecified 12/07/2012  . Arthritis   . Asthma   . Bipolar disorder, unspecified ([place]) 12/07/2012  . Depression   . Diabetes mellitus without complication ([place])    no meds currently- controlled with diet  . GERD (gastroesophageal reflux disease)   . GERD (gastroesophageal reflux disease) 12/07/2012  . Headache(784.0)    migraines  . Herpes genitalis in women 12/07/2012  . HIV infection ([place])   . Human immunodeficiency virus (HIV) disease ([place]) 12/07/2012  . Neuromuscular disorder ([place])  carpal tunnel in both arms  . Type II or unspecified type diabetes mellitus without mention of complication, not stated as uncontrolled 12/07/2012  . Unspecified asthma(493.90) 12/07/2012    Allergies  Allergen Reactions  . Seroquel [Quetiapine Fumarate] Swelling    Tongue swells   . Benzoin Other (See Comments)    blisters  . Latex Rash and Other (See Comments)  . Sulfamethoxazole-Trimethoprim Itching and Nausea And Vomiting    Social History   Tobacco Use  . Smoking status: Never Smoker  . Smokeless tobacco: Never Used  Substance Use Topics  . Alcohol use: No    Alcohol/week: 0.0 oz    Comment: OCC.about once a month   . Drug use: No    Family History  Problem Relation Age of Onset  . Arthritis/Rheumatoid Mother   . Hypertension Mother   . Cancer Maternal Aunt        brand and lung  . Cancer Maternal Grandmother        breasts  . Cancer Paternal Grandmother        breast and kidney    Social History   Substance and Sexual Activity  Sexual Activity No  . Partners: Male  . Birth control/protection: Implant    Physical Exam and Objective Findings:  Oxygen Saturation - 100% on room air prior to nebulizer treatment  HR 88 Afebrile   Physical Exam  Constitutional: She is oriented to person, place, and  time and well-developed, well-nourished, and in no distress.  Appears fatigued, mildly short of breath but mostly with frequent dry cough. She is non-toxic and non-hypoxic.   HENT:  Right Ear: Tympanic membrane normal.  Left Ear: Tympanic membrane normal.  Nose: Nose normal.  Mouth/Throat: Oropharynx is clear and moist. No oral lesions. No dental abscesses. No posterior oropharyngeal edema.  Left maxillary sinus pain with palpation   Eyes: Pupils are equal, round, and reactive to light.  Cardiovascular: Normal rate, regular rhythm and normal heart sounds.  Pulmonary/Chest: Effort normal.  Distant breath sounds overall and difficult to assess with frequent coughing provoked by deep breaths. She does have audible wheezing in upper airways.  Abdominal: Soft. She exhibits no distension. There is no tenderness.  Musculoskeletal: Normal range of motion. She exhibits no tenderness.  Lymphadenopathy:    She has no cervical adenopathy.  Neurological: She is alert and oriented to person, place, and time.  Skin: Skin is warm and dry. No rash noted.  Psychiatric: Mood, affect and judgment normal.  Vitals reviewed.   Lab Results Lab Results  Component Value Date   WBC 8.3 04/06/2017   HGB 14.3 04/06/2017   HCT 42.2 04/06/2017   MCV 95.9 04/06/2017   PLT 299 04/06/2017    Lab Results  Component Value Date   CREATININE 1.23 (H) 04/06/2017   BUN 13 04/06/2017   NA 138 04/06/2017   K 4.1 04/06/2017   CL 110 04/06/2017   CO2 18 (L) 04/06/2017    Lab Results  Component Value Date   ALT 26 04/06/2017   AST 16 04/06/2017   ALKPHOS 56 04/06/2017   BILITOT 0.5 04/06/2017    Lab Results  Component Value Date   CHOL 210 (H) 04/06/2017   HDL 36 (L) 04/06/2017   LDLCALC 149 (H) 04/06/2017   TRIG 123 04/06/2017   CHOLHDL 5.8 (H) 04/06/2017   HIV 1 RNA Quant (copies/mL)  Date Value  04/19/2017 21 (H)  04/06/2017 <20 DETECTED (A)  01/11/2017 27 (H)   CD4 T  Cell Abs (/uL)  Date Value    04/19/2017 1,130  04/06/2017 970  01/11/2017 1,510   No results found for: HAV Lab Results  Component Value Date   HEPBSAG NEG 11/10/2006   HEPBSAB REACTIVE (A) 08/03/2017   Lab Results  Component Value Date   HCVAB NEG 11/10/2006   Lab Results  Component Value Date   CHLAMYDIAWP Negative 08/03/2017   N Negative 08/03/2017   Lab Results  Component Value Date   GCPROBEAPT NEGATIVE 02/12/2013   Lab Results  Component Value Date   QUANTGOLD NEGATIVE 04/29/2013   No results found for: RPR    Problem List Items Addressed This Visit      Respiratory   Asthma exacerbation    Distant breath sounds       Relevant Medications   albuterol (PROVENTIL) (2.5 MG/3ML) 0.083% nebulizer solution   predniSONE (DELTASONE) 20 MG tablet   albuterol (PROVENTIL) (2.5 MG/3ML) 0.083% nebulizer solution 2.5 mg (Completed)     Other   Cough    Wheezing and distant breath sounds. Will give her nebulizer treatment and       Relevant Medications   albuterol (PROVENTIL) (2.5 MG/3ML) 0.083% nebulizer solution 2.5 mg (Completed)   Other Relevant Orders   DG Chest 2 View (Completed)     Patient kept in office for 15 minutes after nebulizer treatment for monitoring. She felt "a little better". Cough effort did appear to be reduced. She was escorted to radiology for chest x-ray.   Janene Madeira, MSN, NP-C [facility] Pager: [phone]   ADDENDUM: CXR clear. I called her to inform of these results and reinforced original plan. Advised to continue to monitor symptoms as she could still be at risk for developing pneumonia. Antibiotics not indicated for 'preventative treatment' (requested).

## 2017-08-30 NOTE — Assessment & Plan Note (Addendum)
Exam consistent with asthma exacerbation likely due to viral URI. Difficult to adequately hear breath sounds with frequent cough effort. I will give her an albuterol neb in office today as well as rx for outpatient use over the next 48 hours until steroids kick in. Prednisone 40 mg BID x 5 days provided. Advised to not use MDI with nebs but to continue with inhaled corticosteroid.   Will check cxr to assess for pneumonia. Work note provided for tomorrow. Advised to continue with supportive care. Advised to seek care at Urgent Care or PCP should her symptoms persist or ED if they worsen.

## 2017-08-30 NOTE — Progress Notes (Signed)
No pneumonia on CXR. Called to advise of results. Continue prednisone, tylenol for pain, mucinex, fluids and nebulizer. ED precautions given.

## 2017-08-30 NOTE — Assessment & Plan Note (Signed)
Wheezing and distant breath sounds. Will give her nebulizer treatment and

## 2017-09-01 ENCOUNTER — Encounter: Payer: Self-pay | Admitting: Infectious Diseases

## 2017-09-02 ENCOUNTER — Other Ambulatory Visit (INDEPENDENT_AMBULATORY_CARE_PROVIDER_SITE_OTHER): Payer: BLUE CROSS/BLUE SHIELD

## 2017-09-02 DIAGNOSIS — E785 Hyperlipidemia, unspecified: Secondary | ICD-10-CM

## 2017-09-02 DIAGNOSIS — L309 Dermatitis, unspecified: Secondary | ICD-10-CM | POA: Diagnosis not present

## 2017-09-03 LAB — COMPREHENSIVE METABOLIC PANEL
ALBUMIN: 3.9 g/dL (ref 3.5–5.5)
ALT: 104 IU/L — ABNORMAL HIGH (ref 0–32)
AST: 44 IU/L — AB (ref 0–40)
Albumin/Globulin Ratio: 1.7 (ref 1.2–2.2)
Alkaline Phosphatase: 55 IU/L (ref 39–117)
BUN/Creatinine Ratio: 14 (ref 9–23)
BUN: 15 mg/dL (ref 6–20)
Bilirubin Total: 0.3 mg/dL (ref 0.0–1.2)
CALCIUM: 8.7 mg/dL (ref 8.7–10.2)
CO2: 19 mmol/L — AB (ref 20–29)
CREATININE: 1.08 mg/dL — AB (ref 0.57–1.00)
Chloride: 100 mmol/L (ref 96–106)
GFR calc Af Amer: 76 mL/min/{1.73_m2} (ref >59)
GFR, EST NON AFRICAN AMERICAN: 66 mL/min/{1.73_m2} (ref >59)
GLUCOSE: 93 mg/dL (ref 65–99)
Globulin, Total: 2.3 g/dL (ref 1.5–4.5)
Potassium: 3.5 mmol/L (ref 3.5–5.2)
Sodium: 135 mmol/L (ref 134–144)
Total Protein: 6.2 g/dL (ref 6.0–8.5)

## 2017-09-03 LAB — CBC WITH DIFFERENTIAL/PLATELET
Basophils Absolute: 0 10*3/uL (ref 0.0–0.2)
Basos: 0 %
EOS (ABSOLUTE): 0 10*3/uL (ref 0.0–0.4)
EOS: 0 %
Hematocrit: 39.8 % (ref 34.0–46.6)
Hemoglobin: 13.5 g/dL (ref 11.1–15.9)
IMMATURE GRANS (ABS): 0.2 10*3/uL — AB (ref 0.0–0.1)
IMMATURE GRANULOCYTES: 2 %
Lymphocytes Absolute: 4.8 10*3/uL — ABNORMAL HIGH (ref 0.7–3.1)
Lymphs: 43 %
MCH: 32.1 pg (ref 26.6–33.0)
MCHC: 33.9 g/dL (ref 31.5–35.7)
MCV: 95 fL (ref 79–97)
MONOCYTES: 6 %
Monocytes Absolute: 0.7 10*3/uL (ref 0.1–0.9)
NEUTROS PCT: 49 %
Neutrophils Absolute: 5.3 10*3/uL (ref 1.4–7.0)
Platelets: 349 10*3/uL (ref 150–379)
RBC: 4.2 x10E6/uL (ref 3.77–5.28)
RDW: 13.3 % (ref 12.3–15.4)
WBC: 11.1 10*3/uL — AB (ref 3.4–10.8)

## 2017-09-03 LAB — LIPID PANEL
CHOLESTEROL TOTAL: 179 mg/dL (ref 100–199)
Chol/HDL Ratio: 5.8 ratio — ABNORMAL HIGH (ref 0.0–4.4)
HDL: 31 mg/dL — AB (ref >39)
LDL CALC: 120 mg/dL — AB (ref 0–99)
TRIGLYCERIDES: 139 mg/dL (ref 0–149)
VLDL CHOLESTEROL CAL: 28 mg/dL (ref 5–40)

## 2017-09-04 NOTE — Progress Notes (Signed)
Recent labs:  LDL bad cholesterol remains elevated however will defer starting statin therapy as liver enzymes are elevated.Schedule patient to repeat hepatic panel function testing in 3 weeks.she will need to remain fasting. In the meantime she should avoid alcohol and or acetaminophen (tyelnol). If liver enzymes remain elevated with next labs, I will order a liver ultrasound   Carroll Sage. Kenton Kingfisher, MSN, FNP-C [facility]  [address] [place], [place] [address] [phone]

## 2017-09-09 ENCOUNTER — Ambulatory Visit (INDEPENDENT_AMBULATORY_CARE_PROVIDER_SITE_OTHER): Payer: BLUE CROSS/BLUE SHIELD | Admitting: Family Medicine

## 2017-09-09 ENCOUNTER — Other Ambulatory Visit: Payer: Self-pay

## 2017-09-09 ENCOUNTER — Encounter: Payer: Self-pay | Admitting: Family Medicine

## 2017-09-09 VITALS — BP 117/70 | HR 68 | Ht 64.0 in | Wt 196.0 lb

## 2017-09-09 DIAGNOSIS — J45901 Unspecified asthma with (acute) exacerbation: Secondary | ICD-10-CM

## 2017-09-09 DIAGNOSIS — J40 Bronchitis, not specified as acute or chronic: Secondary | ICD-10-CM | POA: Diagnosis not present

## 2017-09-09 MED ORDER — BENZONATATE 100 MG PO CAPS: 100.0000 mg | ORAL_CAPSULE | Freq: Three times a day (TID) | ORAL | 0 refills | Status: DC | PRN

## 2017-09-09 MED ORDER — AZITHROMYCIN 250 MG PO TABS: ORAL_TABLET | ORAL | 0 refills | Status: DC

## 2017-09-09 NOTE — Patient Instructions (Signed)
Take Prednisone 20 mg,  in mornings with breakfast as follows:  Take 3 pills for 3 days, Take 2 pills for 3 days, and Take 1 pill for 3 days.  Complete all medication.  Start Azithromycin Take 2 tabs x 1 dose, then 1 tab every day for x 4 days   Continue albuterol inhaler as needed.   If symptoms worsen, return for care.    Acute Bronchitis, Adult Acute bronchitis is when air tubes (bronchi) in the lungs suddenly get swollen. The condition can make it hard to breathe. It can also cause these symptoms:  A cough.  Coughing up clear, yellow, or green mucus.  Wheezing.  Chest congestion.  Shortness of breath.  A fever.  Body aches.  Chills.  A sore throat.  Follow these instructions at home: Medicines  Take over-the-counter and prescription medicines only as told by your doctor.  If you were prescribed an antibiotic medicine, take it as told by your doctor. Do not stop taking the antibiotic even if you start to feel better. General instructions  Rest.  Drink enough fluids to keep your pee (urine) clear or pale yellow.  Avoid smoking and secondhand smoke. If you smoke and you need help quitting, ask your doctor. Quitting will help your lungs heal faster.  Use an inhaler, cool mist vaporizer, or humidifier as told by your doctor.  Keep all follow-up visits as told by your doctor. This is important. How is this prevented? To lower your risk of getting this condition again:  Wash your hands often with soap and water. If you cannot use soap and water, use hand sanitizer.  Avoid contact with people who have cold symptoms.  Try not to touch your hands to your mouth, nose, or eyes.  Make sure to get the flu shot every year.  Contact a doctor if:  Your symptoms do not get better in 2 weeks. Get help right away if:  You cough up blood.  You have chest pain.  You have very bad shortness of breath.  You become dehydrated.  You faint (pass out) or keep feeling  like you are going to pass out.  You keep throwing up (vomiting).  You have a very bad headache.  Your fever or chills gets worse. This information is not intended to replace advice given to you by your health care provider. Make sure you discuss any questions you have with your health care provider. Document Released: 01/19/2008 Document Revised: 03/10/2016 Document Reviewed: 01/21/2016 Elsevier Interactive Patient Education  [employer].

## 2017-09-09 NOTE — Progress Notes (Signed)
Patient ID: [name], female    DOB: 1981/02/17, 37 y.o.   MRN: [id number]  PCP: Scot Jun, FNP  Chief Complaint  Patient presents with  . Bronchitis    still having to use inhaler more than usual    Subjective:  HPI CARMINA Neal is a 37 y.o. female with HIV, Asthma,  presents for evaluation of worsening bronchitis. Patient was seen at infectious disease 08/30/2017 and diagnosed with an asthma exacerbation with likely bronchitis.  She was treated with 40 mg of prednisone times 5 days scribed a short acting inhaler.  Reports today that she is experiencing a worsening cough, chest tightness, wheezing, and shortness of breath.  She completed the prednisone has been using her albuterol inhaler but consistently every 4-6 hours since 08/30/2017.  He denies fever, nausea/vomiting, chills, or body aches.  She has not attempted relief with any other medication other than what was prescribed during her infectious disease appointment. Social History   Socioeconomic History  . Marital status: Single    Spouse name: Not on file  . Number of children: 1  . Years of education: 69  . Highest education level: Not on file  Social Needs  . Financial resource strain: Not on file  . Food insecurity - worry: Not on file  . Food insecurity - inability: Not on file  . Transportation needs - medical: Not on file  . Transportation needs - non-medical: Not on file  Occupational History  . Not on file  Tobacco Use  . Smoking status: Never Smoker  . Smokeless tobacco: Never Used  Substance and Sexual Activity  . Alcohol use: No    Alcohol/week: 0.0 oz    Comment: OCC.about once a month   . Drug use: No  . Sexual activity: No    Partners: Male    Birth control/protection: Implant  Other Topics Concern  . Not on file  Social History Narrative   Patient lives alone.   Patient works at home.   Patient has hs education.   Patient has 1 child.   Patient drinks caffeine once in a  while.    Family History  Problem Relation Age of Onset  . Arthritis/Rheumatoid Mother   . Hypertension Mother   . Cancer Maternal Aunt        brand and lung  . Cancer Maternal Grandmother        breasts  . Cancer Paternal Grandmother        breast and kidney   Review of Systems  Constitutional: Positive for fatigue. Negative for fever and unexpected weight change.  HENT: Positive for congestion.   Respiratory: Positive for shortness of breath and wheezing.   Cardiovascular: Negative.   Musculoskeletal: Negative.   Skin: Negative.   Allergic/Immunologic: Positive for immunocompromised state.    Patient Active Problem List   Diagnosis Date Noted  . Cough 08/30/2017  . Asthma exacerbation 08/30/2017  . Bipolar mixed affective disorder, moderate ([place]) 03/20/2017  . Chronic tension headaches 01/23/2017  . Hyperlipidemia 11/17/2015  . Sinusitis 06/30/2015  . Hernia, abdominal 08/05/2014  . Vaginitis and vulvovaginitis 06/19/2014  . 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 Lung abscess ([place]) 06/26/2013  . 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   05/07/2013  . Ovarian cystic mass 05/07/2013  . s/p Serosal repair of colon 12/04/2012 12/15/2012  . Herpes genitalis in women 12/07/2012  . Anxiety state, unspecified 12/07/2012  . GERD (gastroesophageal reflux disease) 12/07/2012  .  Pelvic mass in female s/p Robotic-assisted right salpingo-oophorectomy 12/04/2012  . SHINGLES 01/31/2009  . HEMORRHOIDS NOS, W/O COMPLICATIONS 92/06/9416  . Depression 11/25/2006  . Allergic rhinitis 11/25/2006  . 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 Human immunodeficiency virus (HIV) disease ([place]) 11/10/2006    Allergies  Allergen Reactions  . Seroquel [Quetiapine Fumarate] Swelling    Tongue swells   . Benzoin Other (See Comments)    blisters  . Latex Rash and Other (See Comments)  . Sulfamethoxazole-Trimethoprim Itching and Nausea And Vomiting     Prior to Admission medications   Medication Sig Start Date End Date Taking? Authorizing Provider  albuterol (PROAIR HFA) 108 (90 Base) MCG/ACT inhaler INHALE TWO PUFFS INTO THE LUNGS EVERY 6 HOURS AS NEEDED FOR WHEEZING. 07/04/17  Yes Campbell Riches, MD  beclomethasone (QVAR) 40 MCG/ACT inhaler INHALE TWO PUFFS INTO THE LUNGS TWO TIMES DAILY. 05/31/17  Yes Campbell Riches, MD  BIKTARVY 50-200-25 MG TABS tablet TAKE ONE TABLET BY MOUTH ONCE DAILY 08/26/17  Yes Campbell Riches, MD  cetirizine (ZYRTEC) 10 MG tablet Take 2 tablets (20 mg total) by mouth at bedtime. 04/19/17  Yes Scot Jun, FNP  DENTA 5000 PLUS 1.1 % CREA dental cream USE SMALL AMOUNT ON TEETH AND GUMS EVERY EVENING SPIT OUT EXCESS AND DO NOT RINSE 03/03/17  Yes [provider]  fexofenadine (ALLEGRA ALLERGY) 180 MG tablet Take 1 tablet (180 mg total) by mouth daily. 04/28/16  Yes Micheline Chapman, NP  JUNEL 1.5/30 1.5-30 MG-MCG tablet Take 1 tablet by mouth daily. 07/28/17  Yes [provider]  lurasidone (LATUDA) 80 MG TABS tablet Take 1 tablet (80 mg total) by mouth daily with breakfast. 08/04/17  Yes Eksir, Richard Miu, MD  meloxicam (MOBIC) 15 MG tablet Take 1 tablet (15 mg total) by mouth daily. 06/17/17  Yes Scot Jun, FNP  montelukast (SINGULAIR) 10 MG tablet Take 1 tablet (10 mg total) at bedtime by mouth. 07/04/17  Yes Campbell Riches, MD  oxymetazoline (AFRIN) 0.05 % nasal spray Place 1 spray into both nostrils 2 (two) times daily. For <7 days at a time.   Yes [provider]  ranitidine (ZANTAC) 150 MG tablet Take 1 tablet (150 mg total) by mouth 2 (two) times daily. 08/19/17  Yes Scot Jun, FNP  rizatriptan (MAXALT-MLT) 5 MG disintegrating tablet Take 2 tablets (10 mg total) by mouth as needed for migraine. May repeat in 2 hours if needed 01/20/17  Yes Scot Jun, FNP  tiZANidine (ZANAFLEX) 4 MG capsule Take 1 capsule (4 mg total) by mouth 3 (three)  times daily as needed for muscle spasms. 01/20/17  Yes Scot Jun, FNP  Topiramate ER 100 MG CS24 Take 100 mg by mouth at bedtime. 01/20/17  Yes Scot Jun, FNP  traZODone (DESYREL) 100 MG tablet Take 1 tablet (100 mg total) by mouth at bedtime. Take 1-2 tablets for sleep 08/04/17  Yes Eksir, Richard Miu, MD  triamcinolone cream (KENALOG) 0.1 % Apply 1 application topically 2 (two) times daily. 08/19/17  Yes Scot Jun, FNP  valACYclovir (VALTREX) 500 MG tablet TAKE 1 TABLET (500 MG TOTAL) BY MOUTH DAILY. 08/19/17  Yes Scot Jun, FNP  albuterol (PROVENTIL) (2.5 MG/3ML) 0.083% nebulizer solution Take 3 mLs (2.5 mg total) by nebulization every 6 (six) hours as needed for wheezing or shortness of breath (If no relief from MDI inhaler). Patient not taking: Reported on 09/09/2017 08/30/17   [place] Callas, NP  azithromycin (ZITHROMAX) 250 MG tablet Take 2 tabs PO x 1 dose, then 1 tab PO QD x 4 days 09/09/17   Scot Jun, FNP  benzonatate (TESSALON) 100 MG capsule Take 1-2 capsules (100-200 mg total) by mouth 3 (three) times daily as needed for cough. 09/09/17   Scot Jun, FNP  diclofenac sodium (VOLTAREN) 1 % GEL Apply 2 g 4 (four) times daily topically. Patient not taking: Reported on 08/19/2017 06/30/17   Scot Jun, FNP  lidocaine (LIDODERM) 5 % Place 1 patch onto the skin daily. Remove & Discard patch within 12 hours or as directed by MD Patient not taking: Reported on 07/04/2017 06/17/17   Scot Jun, FNP  lubiprostone (AMITIZA) 8 MCG capsule Take 8 mcg by mouth 2 (two) times daily with a meal.    [provider]  Norethindrone Acetate-Ethinyl Estradiol (LOESTRIN 1.5/30, 21,) 1.5-30 MG-MCG tablet Take 1 tablet by mouth daily. 04/01/17 06/30/17  Constant, Peggy, MD    Past Medical, Surgical Family and Social History reviewed and updated.    Objective:   Today's Vitals   09/09/17 1345 09/09/17 1348  BP:  117/70  Pulse:  68   SpO2:  100%  Weight:  196 lb (88.9 kg)  Height:  5\' 4"  (1.626 m)  PainSc: 0-No pain 0-No pain    Wt Readings from Last 3 Encounters:  09/09/17 196 lb (88.9 kg)  08/19/17 202 lb (91.6 kg)  07/04/17 190 lb (86.2 kg)    Physical Exam  Constitutional: She is oriented to person, place, and time. She appears ill.  HENT:  Head: Normocephalic and atraumatic.  Eyes: Conjunctivae and EOM are normal. Pupils are equal, round, and reactive to light.  Neck: Normal range of motion.  Cardiovascular: Normal rate, regular rhythm, normal heart sounds and intact distal pulses.  Pulmonary/Chest: Effort normal and breath sounds normal.  Musculoskeletal: Normal range of motion.  Lymphadenopathy:    She has no cervical adenopathy.  Neurological: She is alert and oriented to person, place, and time.  Skin: Skin is warm and dry.  Psychiatric: She has a normal mood and affect. Her behavior is normal. Judgment and thought content normal.   Assessment & Plan:  1. Bronchitis 2. Exacerbation of asthma, unspecified asthma severity, unspecified whether persistent Recently treated with prednisone to reduce very inflammation.  To use to experience symptoms.  Will cover with azithromycin for suspected bronchitis.  For cough will treat with benzonatate 100-200 mg 3 times daily as needed for cough.  Advised if symptoms worsen, cough becomes productive of purulent sputum, or chest tightness increases low up immediately for a chest x-ray.     Meds ordered this encounter  Medications  . azithromycin (ZITHROMAX) 250 MG tablet    Sig: Take 2 tabs PO x 1 dose, then 1 tab PO QD x 4 days    Dispense:  6 tablet    Refill:  0    Order Specific Question:   Supervising Provider    Answer:   Tresa Garter [id number]  . benzonatate (TESSALON) 100 MG capsule    Sig: Take 1-2 capsules (100-200 mg total) by mouth 3 (three) times daily as needed for cough.    Dispense:  60 capsule    Refill:  0    Order Specific  Question:   Supervising Provider    Answer:   Tresa Garter [id number]     Return for follow-up as needed or if symptoms does not improve.  Carroll Sage.  Kenton Kingfisher, MSN, FNP-C [facility]  [address] Barbara Cower [place], [place] [address] [phone]

## 2017-09-16 DIAGNOSIS — L2089 Other atopic dermatitis: Secondary | ICD-10-CM | POA: Diagnosis not present

## 2017-09-27 ENCOUNTER — Other Ambulatory Visit: Payer: Self-pay

## 2017-09-27 ENCOUNTER — Ambulatory Visit (INDEPENDENT_AMBULATORY_CARE_PROVIDER_SITE_OTHER): Payer: BLUE CROSS/BLUE SHIELD | Admitting: Licensed Clinical Social Worker

## 2017-09-27 ENCOUNTER — Encounter (HOSPITAL_COMMUNITY): Payer: Self-pay | Admitting: Psychiatry

## 2017-09-27 DIAGNOSIS — Z21 Asymptomatic human immunodeficiency virus [HIV] infection status: Secondary | ICD-10-CM | POA: Diagnosis not present

## 2017-09-27 DIAGNOSIS — Z91410 Personal history of adult physical and sexual abuse: Secondary | ICD-10-CM | POA: Diagnosis not present

## 2017-09-27 DIAGNOSIS — F321 Major depressive disorder, single episode, moderate: Secondary | ICD-10-CM

## 2017-09-27 DIAGNOSIS — F4312 Post-traumatic stress disorder, chronic: Secondary | ICD-10-CM | POA: Diagnosis not present

## 2017-09-27 DIAGNOSIS — Z62810 Personal history of physical and sexual abuse in childhood: Secondary | ICD-10-CM

## 2017-09-30 ENCOUNTER — Other Ambulatory Visit (HOSPITAL_COMMUNITY): Payer: Self-pay | Admitting: Psychiatry

## 2017-09-30 ENCOUNTER — Encounter (HOSPITAL_COMMUNITY): Payer: Self-pay | Admitting: Licensed Clinical Social Worker

## 2017-09-30 DIAGNOSIS — F321 Major depressive disorder, single episode, moderate: Secondary | ICD-10-CM

## 2017-09-30 DIAGNOSIS — F0630 Mood disorder due to known physiological condition, unspecified: Secondary | ICD-10-CM

## 2017-09-30 MED ORDER — LURASIDONE HCL 120 MG PO TABS: 120.0000 mg | ORAL_TABLET | Freq: Every day | ORAL | 0 refills | Status: DC
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
## 2017-10-03 ENCOUNTER — Encounter (HOSPITAL_COMMUNITY): Payer: Self-pay | Admitting: Psychiatry

## 2017-10-03 ENCOUNTER — Other Ambulatory Visit (HOSPITAL_COMMUNITY): Payer: Self-pay | Admitting: Psychiatry

## 2017-10-03 DIAGNOSIS — F0630 Mood disorder due to known physiological condition, unspecified: Secondary | ICD-10-CM

## 2017-10-03 DIAGNOSIS — F321 Major depressive disorder, single episode, moderate: Secondary | ICD-10-CM

## 2017-10-03 MED ORDER — LURASIDONE HCL 40 MG PO TABS: 40.0000 mg | ORAL_TABLET | Freq: Every day | ORAL | 1 refills | Status: DC

## 2017-10-03 MED ORDER — LORAZEPAM 0.5 MG PO TABS: 0.5000 mg | ORAL_TABLET | Freq: Two times a day (BID) | ORAL | 1 refills | Status: DC

## 2017-10-03 NOTE — Progress Notes (Signed)
Spoke with patient and educated her on the risk of oculogyric crisis.  What she describes is more consistent with nystagmus, with horizontal, not vertical, movements of the eye.  I instructed her to decrease Latuda to 80 mg daily, and sent in prescription for Ativan 0.5 mg twice daily to use for anxiety and muscle twitching.  She did not have any of the symptoms with Latuda 40 mg and tolerated that dose without any issue.

## 2017-10-06 ENCOUNTER — Telehealth (HOSPITAL_COMMUNITY): Payer: Self-pay

## 2017-10-06 NOTE — Telephone Encounter (Signed)
Medication management - Fax received from patient's [facility] on [address] reporting patient's Latuda 40 mg prescription not covered and would cost over $1000 to fill.

## 2017-10-07 ENCOUNTER — Encounter (HOSPITAL_COMMUNITY): Payer: Self-pay | Admitting: Psychiatry

## 2017-10-07 NOTE — Telephone Encounter (Signed)
Im a bit confused, she had told me she recently picked up a prescription of the latuda 80 mg tablet.  Perhaps she is taking samples?

## 2017-10-12 ENCOUNTER — Encounter: Payer: Self-pay | Admitting: Infectious Diseases

## 2017-10-13 ENCOUNTER — Other Ambulatory Visit: Payer: Self-pay | Admitting: *Deleted

## 2017-10-13 DIAGNOSIS — B20 Human immunodeficiency virus [HIV] disease: Secondary | ICD-10-CM

## 2017-10-13 MED ORDER — BICTEGRAVIR-EMTRICITAB-TENOFOV 50-200-25 MG PO TABS: 1.0000 | ORAL_TABLET | Freq: Every day | ORAL | 5 refills | Status: DC

## 2017-10-18 ENCOUNTER — Ambulatory Visit (HOSPITAL_COMMUNITY): Payer: BLUE CROSS/BLUE SHIELD | Admitting: Licensed Clinical Social Worker

## 2017-10-18 DIAGNOSIS — F321 Major depressive disorder, single episode, moderate: Secondary | ICD-10-CM | POA: Diagnosis not present

## 2017-10-18 DIAGNOSIS — F4312 Post-traumatic stress disorder, chronic: Secondary | ICD-10-CM
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
## 2017-10-20 ENCOUNTER — Encounter (HOSPITAL_COMMUNITY): Payer: Self-pay | Admitting: Licensed Clinical Social Worker

## 2017-10-27 ENCOUNTER — Ambulatory Visit: Payer: BLUE CROSS/BLUE SHIELD | Admitting: Obstetrics & Gynecology

## 2017-10-27 VITALS — BP 123/82 | HR 80 | Ht 64.0 in | Wt 192.7 lb

## 2017-10-27 DIAGNOSIS — Z113 Encounter for screening for infections with a predominantly sexual mode of transmission: Secondary | ICD-10-CM | POA: Diagnosis not present

## 2017-10-27 DIAGNOSIS — Z308 Encounter for other contraceptive management: Secondary | ICD-10-CM

## 2017-10-27 DIAGNOSIS — Z3046 Encounter for surveillance of implantable subdermal contraceptive: Secondary | ICD-10-CM

## 2017-10-27 DIAGNOSIS — Z30017 Encounter for initial prescription of implantable subdermal contraceptive: Secondary | ICD-10-CM

## 2017-10-27 DIAGNOSIS — R102 Pelvic and perineal pain: Secondary | ICD-10-CM

## 2017-10-27 MED ORDER — ETONOGESTREL 68 MG ~~LOC~~ IMPL
68.0000 mg | DRUG_IMPLANT | Freq: Once | SUBCUTANEOUS | Status: AC
  Administered 2017-10-27: 68 mg via SUBCUTANEOUS

## 2017-10-27 NOTE — Progress Notes (Signed)
Pt states thinks she has a BV is havin d/c with odor

## 2017-10-28 LAB — CERVICOVAGINAL ANCILLARY ONLY
BACTERIAL VAGINITIS: NEGATIVE
CANDIDA VAGINITIS: NEGATIVE
Chlamydia: NEGATIVE
NEISSERIA GONORRHEA: NEGATIVE
Trichomonas: NEGATIVE

## 2017-10-28 NOTE — Progress Notes (Signed)
Pt present for Nexplanon removal and reinsertion .   Patient given informed consent, she signed consent form. She has gained >40# since her Nexplanon was placed.  She wants it removed.  Appropriate time out taken.  Patient's left arm was prepped and draped in the usual sterile fashion. Patient was prepped with alcohol swab and then injected with 3 ml of 1 % lidocaine.  She was prepped with betadine.  A #11 blade was used to make a small incision over the Nexplanon rod.  Using curved forceps, the end of the rod was grasped and the scar tissue was removed and the device was removed intact.  There was minimal blood loss. Patient's left arm was prepped and draped in the usual sterile fashion.. The ruler used to measure and mark insertion area.  Nexplanon removed from packaging,  Device confirmed in needle, then inserted full length of needle and withdrawn per handbook instructions.  There was minimal blood loss.  Patient insertion site covered with guaze and a pressure bandage to reduce any bruising.  The patient tolerated the procedure well and was given post procedure instructions. Return in about one month for Nexplanon check.  Lea Walbert L. Harraway-Smith, M.D., Cherlynn June

## 2017-10-30 ENCOUNTER — Other Ambulatory Visit: Payer: Self-pay | Admitting: Infectious Diseases

## 2017-10-30 DIAGNOSIS — J45991 Cough variant asthma: Secondary | ICD-10-CM

## 2017-11-01 ENCOUNTER — Ambulatory Visit (HOSPITAL_COMMUNITY)
Admission: RE | Admit: 2017-11-01 | Discharge: 2017-11-01 | Disposition: A | Discharge status: Home / Self Care | Payer: BLUE CROSS/BLUE SHIELD | Source: Ambulatory Visit | Attending: Obstetrics & Gynecology | Admitting: Obstetrics & Gynecology

## 2017-11-01 ENCOUNTER — Ambulatory Visit (INDEPENDENT_AMBULATORY_CARE_PROVIDER_SITE_OTHER): Payer: BLUE CROSS/BLUE SHIELD | Admitting: Licensed Clinical Social Worker

## 2017-11-01 ENCOUNTER — Encounter (HOSPITAL_COMMUNITY): Payer: Self-pay | Admitting: Licensed Clinical Social Worker

## 2017-11-01 DIAGNOSIS — F4312 Post-traumatic stress disorder, chronic: Secondary | ICD-10-CM | POA: Diagnosis not present

## 2017-11-01 DIAGNOSIS — F321 Major depressive disorder, single episode, moderate: Secondary | ICD-10-CM

## 2017-11-01 DIAGNOSIS — R102 Pelvic and perineal pain: Secondary | ICD-10-CM

## 2017-11-01 NOTE — Progress Notes (Signed)
   THERAPIST PROGRESS NOTE  Session Time: 2-3  Participation Level: Minimal  Behavioral Response: Casual and Fairly GroomedLethargicDepressed and Dysphoric  Type of Therapy: Individual Therapy  Treatment Goals addressed: Diagnosis: MDD,  Interventions: CBT, Strength-based and Supportive  Summary: [name] is a 37 y.o. female who presents with depression.   Pt is flat and depressed; states that "nothing much has gone on". Counselor asks pt how she would like to utilize time. Pt responds w/ silence.   "I wake up overnight between 2-5am, then try to go back to sleep, if I can't sleep I'll watch TV until I fall back asleep"  Counselor inquires about pt's medications. She reports she has not taken any Trazadone in past 2 months and has chosen instead to take .5mg  Ativan morning and .5mg  Ativan at night. Counselor spent significant time discussing possible addictive potential and encouraged pt to consider taking the Trazadone as px for sleep. Pt was very agreeable to this.  Counselor discussed possiblity of genesight test given pt's hx of severe interactions or ineffectiveness w/ Lithium, seroquel, and "a bunch of other drugs". Pt talked w/ nurse during check out and will inquire further about test if she finds it affordable.  Pt left encouraged to see "something change for her" before she sees Dr. Daron Offer next week.   Suicidal/Homicidal: Nowithout intent/plan  Therapist Response: Counselor used open questions, intentional silence, and psychoeducation. Counselor helped pt identify her main concern of "not being able to sleep. Provided education on sleep hygiene, benzodiazapines, and trazadone side-effects.  Plan: Return again in 2 weeks.  Diagnosis:    ICD-10-CM   1. Major depressive disorder, single episode, moderate (HCC) F32.1   2. Chronic post-traumatic stress disorder (PTSD) F43.12       [name], LCAS-A 11/01/2017

## 2017-11-03 ENCOUNTER — Ambulatory Visit (HOSPITAL_COMMUNITY): Payer: Self-pay | Admitting: Psychiatry

## 2017-11-03 ENCOUNTER — Other Ambulatory Visit: Payer: BLUE CROSS/BLUE SHIELD

## 2017-11-03 DIAGNOSIS — F331 Major depressive disorder, recurrent, moderate: Secondary | ICD-10-CM | POA: Diagnosis not present

## 2017-11-03 DIAGNOSIS — R748 Abnormal levels of other serum enzymes: Secondary | ICD-10-CM

## 2017-11-03 DIAGNOSIS — F900 Attention-deficit hyperactivity disorder, predominantly inattentive type: Secondary | ICD-10-CM | POA: Diagnosis not present

## 2017-11-03 DIAGNOSIS — R635 Abnormal weight gain: Secondary | ICD-10-CM

## 2017-11-03 DIAGNOSIS — R5383 Other fatigue: Secondary | ICD-10-CM

## 2017-11-04 LAB — COMPREHENSIVE METABOLIC PANEL
A/G RATIO: 1.5 (ref 1.2–2.2)
ALK PHOS: 60 IU/L (ref 39–117)
ALT: 34 IU/L — ABNORMAL HIGH (ref 0–32)
AST: 26 IU/L (ref 0–40)
Albumin: 3.8 g/dL (ref 3.5–5.5)
BILIRUBIN TOTAL: 0.4 mg/dL (ref 0.0–1.2)
BUN/Creatinine Ratio: 13 (ref 9–23)
BUN: 13 mg/dL (ref 6–20)
CO2: 22 mmol/L (ref 20–29)
Calcium: 8.9 mg/dL (ref 8.7–10.2)
Chloride: 105 mmol/L (ref 96–106)
Creatinine, Ser: 1.04 mg/dL — ABNORMAL HIGH (ref 0.57–1.00)
GFR calc Af Amer: 79 mL/min/{1.73_m2} (ref >59)
GFR calc non Af Amer: 69 mL/min/{1.73_m2} (ref >59)
Globulin, Total: 2.6 g/dL (ref 1.5–4.5)
Glucose: 130 mg/dL — ABNORMAL HIGH (ref 65–99)
POTASSIUM: 3.9 mmol/L (ref 3.5–5.2)
Sodium: 141 mmol/L (ref 134–144)
Total Protein: 6.4 g/dL (ref 6.0–8.5)

## 2017-11-04 LAB — LIPID PANEL
CHOLESTEROL TOTAL: 213 mg/dL — AB (ref 100–199)
Chol/HDL Ratio: 5 ratio — ABNORMAL HIGH (ref 0.0–4.4)
HDL: 43 mg/dL (ref >39)
LDL Calculated: 143 mg/dL — ABNORMAL HIGH (ref 0–99)
TRIGLYCERIDES: 136 mg/dL (ref 0–149)
VLDL Cholesterol Cal: 27 mg/dL (ref 5–40)

## 2017-11-04 LAB — TSH: TSH: 0.735 u[IU]/mL (ref 0.450–4.500)

## 2017-11-07 ENCOUNTER — Encounter: Payer: Self-pay | Admitting: *Deleted

## 2017-11-09 ENCOUNTER — Telehealth: Payer: Self-pay | Admitting: Family Medicine

## 2017-11-09 DIAGNOSIS — Z8639 Personal history of other endocrine, nutritional and metabolic disease: Secondary | ICD-10-CM

## 2017-11-09 NOTE — Telephone Encounter (Signed)
Please verify with patient that she was fasting when these labs were collected as her glucose was elevated and her cholesterol panel remains elevated. If she was fasting, I would like to start statin therapy, Atorvastatin 40 mg. Please let me know patient's response and I will order medication accordingly.   Carroll Sage. Kenton Kingfisher, MSN, FNP-C [facility]  [address] C[address] [place], [place] [address] [phone]

## 2017-11-10 ENCOUNTER — Encounter (HOSPITAL_COMMUNITY): Payer: Self-pay | Admitting: Psychiatry

## 2017-11-10 ENCOUNTER — Ambulatory Visit (INDEPENDENT_AMBULATORY_CARE_PROVIDER_SITE_OTHER): Payer: BLUE CROSS/BLUE SHIELD | Admitting: Psychiatry

## 2017-11-10 DIAGNOSIS — G4700 Insomnia, unspecified: Secondary | ICD-10-CM

## 2017-11-10 DIAGNOSIS — F4312 Post-traumatic stress disorder, chronic: Secondary | ICD-10-CM

## 2017-11-10 DIAGNOSIS — F321 Major depressive disorder, single episode, moderate: Secondary | ICD-10-CM

## 2017-11-10 DIAGNOSIS — R4586 Emotional lability: Secondary | ICD-10-CM | POA: Diagnosis not present

## 2017-11-10 MED ORDER — LORAZEPAM 0.5 MG PO TABS: 0.5000 mg | ORAL_TABLET | Freq: Every evening | ORAL | 0 refills | Status: DC | PRN

## 2017-11-10 MED ORDER — CITALOPRAM HYDROBROMIDE 20 MG PO TABS: ORAL_TABLET | ORAL | 0 refills | Status: DC

## 2017-11-10 NOTE — Progress Notes (Signed)
[facility] MD/PA/NP OP Progress Note  11/10/2017 3:02 PM [name]  MRN:  [id number]  Chief Complaint:  anxiety, irritability  HPI: [name] reports that she Last took [place] about 3 months ago. Eyes are feeling better.  She continues to struggle with irritability, rumination, worrying about issues that she knows are unreasonable to worry about, difficulty sleeping because she has trouble to stop her worrying.  She feels tense and irritable.  She denies any suicidal thoughts or unsafe thoughts towards others.  She has difficulty with energy and motivation.  Lorazepam was helpful for anxiety but made her fairly sleepy.  She has significant apprehensions about being on any medicine now, we reviewed her gene site testing, and I reassured her that her metabolism of medications appears to be normal.  It appears that she does not generally tolerate atypical antipsychotics.  I suggested we use a low-dose of SSRI, and specifically discussed the risks and benefits of Celexa.  She has been apprehensive about using trazodone as well, so I suggested she just take the Ativan 0.5 mg nightly on an as-needed basis.  She continues in therapy with Wes and does feel that this has been helpful.  Visit Diagnosis:    ICD-10-CM   1. Chronic post-traumatic stress disorder (PTSD) F43.12 LORazepam (ATIVAN) 0.5 MG tablet    citalopram (CELEXA) 20 MG tablet  2. Major depressive disorder, single episode, moderate (HCC) F32.1 LORazepam (ATIVAN) 0.5 MG tablet    citalopram (CELEXA) 20 MG tablet  3. Emotional lability R45.86 LORazepam (ATIVAN) 0.5 MG tablet    citalopram (CELEXA) 20 MG tablet    Past Psychiatric History: See intake H&P for full details. Reviewed, with no updates at this time.  Past Medical History:  Past Medical History:  Diagnosis Date  . Allergy   . Anemia   . Anxiety   . Anxiety state, unspecified 12/07/2012  . Arthritis   . Asthma   . Bipolar disorder, unspecified ([place]) 12/07/2012  .  Depression   . Diabetes mellitus without complication ([place])    no meds currently- controlled with diet  . GERD (gastroesophageal reflux disease)   . GERD (gastroesophageal reflux disease) 12/07/2012  . Headache(784.0)    migraines  . Herpes genitalis in women 12/07/2012  . HIV infection ([place])   . Human immunodeficiency virus (HIV) disease ([place]) 12/07/2012  . Neuromuscular disorder ([place])    carpal tunnel in both arms  . Type II or unspecified type diabetes mellitus without mention of complication, not stated as uncontrolled 12/07/2012  . Unspecified asthma(493.90) 12/07/2012    Past Surgical History:  Procedure Laterality Date  . CESAREAN SECTION    . COLON SURGERY     colon repair after salpingo  . INSERTION OF MESH N/A 11/06/2014   Procedure: INSERTION OF MESH;  Surgeon: Stark Klein, MD;  Location: [facility];  Service: General;  Laterality: N/A;  . LAPAROSCOPIC HEPATECTOMY N/A 06/28/2013   Procedure: LAPAROSCOPIC EXCISION HEPATIC MASS;  Surgeon: Stark Klein, MD;  Location: [facility];  Service: General;  Laterality: N/A;  . LAPAROSCOPIC LYSIS OF ADHESIONS N/A 12/04/2012   Procedure: LAPAROSCOPIC LYSIS OF ADHESIONS;  Surgeon: [name], MD;  Location: [facility];  Service: Gynecology;  Laterality: N/A;  . LAPAROTOMY N/A 12/04/2012   Procedure: EXPLORATORY LAPAROTOMY;  Surgeon: [name], MD;  Location: [facility];  Service: Gynecology;  Laterality: N/A;  repair of serosal injury  . ROBOTIC ASSISTED BILATERAL SALPINGO OOPHERECTOMY Right 12/04/2012   Procedure: ROBOTIC ASSISTED Right  SALPINGO OOPHORECTOMY;  Surgeon: [name], MD;  Location: [facility];  Service: Gynecology;  Laterality: Right;  . VENTRAL HERNIA REPAIR N/A 11/06/2014   Procedure: LAPAROSCOPIC ASSITED INCARCERATED VENTRAL HERNIA REPAIR WITH MESH;  Surgeon: Stark Klein, MD;  Location: [facility];  Service: General;  Laterality: N/A;    Family Psychiatric History: See intake H&P for full details. Reviewed, with no updates  at this time.   Family History:  Family History  Problem Relation Age of Onset  . Arthritis/Rheumatoid Mother   . Hypertension Mother   . Cancer Maternal Aunt        brand and lung  . Cancer Maternal Grandmother        breasts  . Cancer Paternal Grandmother        breast and kidney    Social History:  Social History   Socioeconomic History  . Marital status: Single    Spouse name: Not on file  . Number of children: 1  . Years of education: 33  . Highest education level: Not on file  Occupational History  . Not on file  Social Needs  . Financial resource strain: Not on file  . Food insecurity:    Worry: Not on file    Inability: Not on file  . Transportation needs:    Medical: Not on file    Non-medical: Not on file  Tobacco Use  . Smoking status: Never Smoker  . Smokeless tobacco: Never Used  Substance and Sexual Activity  . Alcohol use: No    Alcohol/week: 0.0 oz    Comment: OCC.about once a month   . Drug use: No  . Sexual activity: Never    Partners: Male    Birth control/protection: Implant  Lifestyle  . Physical activity:    Days per week: Not on file    Minutes per session: Not on file  . Stress: Not on file  Relationships  . Social connections:    Talks on phone: Not on file    Gets together: Not on file    Attends religious service: Not on file    Active member of club or organization: Not on file    Attends meetings of clubs or organizations: Not on file    Relationship status: Not on file  Other Topics Concern  . Not on file  Social History Narrative   Patient lives alone.   Patient works at home.   Patient has hs education.   Patient has 1 child.   Patient drinks caffeine once in a while.    Allergies:  Allergies  Allergen Reactions  . Anette Guarneri [Lurasidone Hcl] Other (See Comments)    Occular dystonia  . Seroquel [Quetiapine Fumarate] Swelling    Tongue swells   . Benzoin Other (See Comments)    blisters  . Latex Rash and Other  (See Comments)  . Sulfamethoxazole-Trimethoprim Itching and Nausea And Vomiting    Metabolic Disorder Labs: Lab Results  Component Value Date   HGBA1C 5.3 08/19/2017   No results found for: PROLACTIN Lab Results  Component Value Date   CHOL 213 (H) 11/03/2017   TRIG 136 11/03/2017   HDL 43 11/03/2017   CHOLHDL 5.0 (H) 11/03/2017   VLDL 25 04/06/2017   LDLCALC 143 (H) 11/03/2017   LDLCALC 120 (H) 09/02/2017   Lab Results  Component Value Date   TSH 0.735 11/03/2017   TSH 1.29 09/13/2016    Therapeutic Level Labs: No results found for: LITHIUM No results found for: VALPROATE No components found  for:  CBMZ  Current Medications: Current Outpatient Medications  Medication Sig Dispense Refill  . albuterol (PROAIR HFA) 108 (90 Base) MCG/ACT inhaler INHALE TWO PUFFS INTO THE LUNGS EVERY 6 HOURS AS NEEDED FOR WHEEZING. 8.5 Inhaler 2  . albuterol (PROVENTIL) (2.5 MG/3ML) 0.083% nebulizer solution Take 3 mLs (2.5 mg total) by nebulization every 6 (six) hours as needed for wheezing or shortness of breath (If no relief from MDI inhaler). 75 mL 3  . beclomethasone (QVAR) 40 MCG/ACT inhaler INHALE TWO PUFFS INTO THE LUNGS TWO TIMES DAILY. 8.7 Inhaler 2  . benzonatate (TESSALON) 100 MG capsule Take 1-2 capsules (100-200 mg total) by mouth 3 (three) times daily as needed for cough. 60 capsule 0  . bictegravir-emtricitabine-tenofovir AF (BIKTARVY) 50-200-25 MG TABS tablet Take 1 tablet by mouth daily. 30 tablet 5  . cetirizine (ZYRTEC) 10 MG tablet Take 2 tablets (20 mg total) by mouth at bedtime. 60 tablet 0  . citalopram (CELEXA) 20 MG tablet Start 1/2 tablet for 1 week, then increase to the whole tablet 90 tablet 0  . DENTA 5000 PLUS 1.1 % CREA dental cream USE SMALL AMOUNT ON TEETH AND GUMS EVERY EVENING SPIT OUT EXCESS AND DO NOT RINSE  3  . fexofenadine (ALLEGRA ALLERGY) 180 MG tablet Take 1 tablet (180 mg total) by mouth daily. 90 tablet 1  . JUNEL 1.5/30 1.5-30 MG-MCG tablet Take 1  tablet by mouth daily.  3  . LORazepam (ATIVAN) 0.5 MG tablet Take 1 tablet (0.5 mg total) by mouth at bedtime as needed for anxiety. 90 tablet 0  . lubiprostone (AMITIZA) 8 MCG capsule Take 8 mcg by mouth 2 (two) times daily with a meal.    . meloxicam (MOBIC) 15 MG tablet Take 1 tablet (15 mg total) by mouth daily. 30 tablet 0  . montelukast (SINGULAIR) 10 MG tablet TAKE 1 TABLET (10 MG TOTAL) AT BEDTIME BY MOUTH. 30 tablet 3  . Norethindrone Acetate-Ethinyl Estradiol (LOESTRIN 1.5/30, 21,) 1.5-30 MG-MCG tablet Take 1 tablet by mouth daily. 90 Package 3  . oxymetazoline (AFRIN) 0.05 % nasal spray Place 1 spray into both nostrils 2 (two) times daily. For <7 days at a time.    . ranitidine (ZANTAC) 150 MG tablet Take 1 tablet (150 mg total) by mouth 2 (two) times daily. 60 tablet 0  . rizatriptan (MAXALT-MLT) 5 MG disintegrating tablet Take 2 tablets (10 mg total) by mouth as needed for migraine. May repeat in 2 hours if needed 10 tablet 0  . tiZANidine (ZANAFLEX) 4 MG capsule Take 1 capsule (4 mg total) by mouth 3 (three) times daily as needed for muscle spasms. 90 capsule 2  . Topiramate ER 100 MG CS24 Take 100 mg by mouth at bedtime. 90 each 1  . traZODone (DESYREL) 100 MG tablet Take 1 tablet (100 mg total) by mouth at bedtime. Take 1-2 tablets for sleep 180 tablet 1  . triamcinolone cream (KENALOG) 0.1 % Apply 1 application topically 2 (two) times daily. 454 g 1  . valACYclovir (VALTREX) 500 MG tablet TAKE 1 TABLET (500 MG TOTAL) BY MOUTH DAILY. 30 tablet 0   No current facility-administered medications for this visit.      Musculoskeletal: Strength & Muscle Tone: within normal limits Gait & Station: normal Patient leans: N/A  Psychiatric Specialty Exam: ROS  There were no vitals taken for this visit.There is no height or weight on file to calculate BMI.  General Appearance: Casual and Well Groomed  Eye Contact:  Good  Speech:  Clear and Coherent and Normal Rate  Volume:  Normal   Mood:  Anxious and Dysphoric  Affect:  Congruent  Thought Process:  Coherent and Descriptions of Associations: Intact  Orientation:  Full (Time, Place, and Person)  Thought Content: Logical   Suicidal Thoughts:  No  Homicidal Thoughts:  No  Memory:  Immediate;   Fair  Judgement:  Fair  Insight:  Fair  Psychomotor Activity:  Normal  Concentration:  Concentration: Fair  Recall:  [employer] of Knowledge: Fair  Language: Fair  Akathisia:  Negative  Handed:  Right  AIMS (if indicated): not done  Assets:  Communication Skills Desire for Improvement Financial Resources/Insurance Housing  ADL's:  Intact  Cognition: WNL  Sleep:  Fair   Screenings: GAD-7     Counselor from 02/28/2017 in [facility] for Infectious Disease Procedure visit from 10/28/2016 in [facility] for [place] [facility] Procedure visit from 09/24/2016 in [facility] for [place]  Total GAD-7 Score  12  1  0    GASS     Counselor from 03/14/2017 in [facility] for Infectious Disease  GASS Overall Total Score  13    MDI     Counselor from 02/28/2017 in [facility] for Infectious Disease  Total Score (max 50)  34    PHQ2-9     Procedure visit from 10/27/2017 in [place] for [place] Visit from 09/09/2017 in [facility] Office Visit from 08/19/2017 in [facility] Office Visit from 07/04/2017 in [facility] for Infectious Disease Office Visit from 05/26/2017 in [facility]  PHQ-2 Total Score  2  0  4  0  4  PHQ-9 Total Score  9  -  12  -  -    SBQ-R     Counselor from 03/14/2017 in [facility] for Infectious Disease  SBQ-R Total Score  13.1       Assessment and Plan:  [name] reports improvement and resolution of her ocular symptoms with discontinuation of Latuda and low-dose Ativan for reduction of anxiety and muscle tension.  She is using  Ativan sparingly, generally in the evenings for sleep.  We discussed initiation of Celexa at a low dose to help with anxiety and depressive symptoms.  She does not have any acute safety issues, and is consistently engaged in individual therapy.  We will proceed as below and follow-up in 2 months.  1. Chronic post-traumatic stress disorder (PTSD)   2. Major depressive disorder, single episode, moderate (HCC)   3. Emotional lability     Status of current problems: unchanged  Labs Ordered: No orders of the defined types were placed in this encounter.   Labs Reviewed: n/a  Collateral Obtained/Records Reviewed: We spent time reviewing her genetic testing for metabolism of medications -normal metabolizer of all antidepressants, antianxiety medicines, mood stabilizers, ADHD medications, pain medications.  No significant abnormality of folic acid reduction  Plan:  Celexa 10 mg x 1 week, then increase to 20 mg Latuda discontinued as of nearly 2 months ago Ativan 0.5 mg nightly as needed for sleep Okay to use trazodone 100 mg at bedtime for sleep  I spent 20 minutes with the patient in direct face-to-face clinical care.  Greater than 50% of this time was spent in counseling and coordination of care with the patient.    Aundra Dubin, MD 11/10/2017, 3:02 PM

## 2017-11-10 NOTE — Addendum Note (Signed)
Addended by: Scot Jun on: 11/10/2017 01:17 PM   Modules accepted: Orders

## 2017-11-10 NOTE — Telephone Encounter (Signed)
Patient notified

## 2017-11-10 NOTE — Telephone Encounter (Signed)
Advise patient to come to office visit scheduled in July without anything to eat for at least 7 hours. Lipid panel can be repeated them.

## 2017-11-10 NOTE — Telephone Encounter (Signed)
Per patient she was not fasting

## 2017-11-15 ENCOUNTER — Other Ambulatory Visit: Payer: Self-pay | Admitting: Obstetrics and Gynecology

## 2017-11-15 ENCOUNTER — Ambulatory Visit (INDEPENDENT_AMBULATORY_CARE_PROVIDER_SITE_OTHER): Payer: BLUE CROSS/BLUE SHIELD | Admitting: Licensed Clinical Social Worker

## 2017-11-15 ENCOUNTER — Encounter (HOSPITAL_COMMUNITY): Payer: Self-pay | Admitting: Licensed Clinical Social Worker

## 2017-11-15 DIAGNOSIS — F4312 Post-traumatic stress disorder, chronic: Secondary | ICD-10-CM | POA: Diagnosis not present

## 2017-11-15 DIAGNOSIS — F321 Major depressive disorder, single episode, moderate: Secondary | ICD-10-CM

## 2017-11-15 DIAGNOSIS — N921 Excessive and frequent menstruation with irregular cycle: Secondary | ICD-10-CM
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
## 2017-11-21 DIAGNOSIS — Z8603 Personal history of neoplasm of uncertain behavior: Secondary | ICD-10-CM | POA: Diagnosis not present

## 2017-11-21 DIAGNOSIS — K432 Incisional hernia without obstruction or gangrene: Secondary | ICD-10-CM | POA: Diagnosis not present

## 2017-11-22 ENCOUNTER — Other Ambulatory Visit: Payer: Self-pay | Admitting: General Surgery

## 2017-11-22 DIAGNOSIS — K432 Incisional hernia without obstruction or gangrene: Secondary | ICD-10-CM

## 2017-11-29 ENCOUNTER — Ambulatory Visit (INDEPENDENT_AMBULATORY_CARE_PROVIDER_SITE_OTHER): Payer: BLUE CROSS/BLUE SHIELD | Admitting: Licensed Clinical Social Worker

## 2017-11-29 DIAGNOSIS — F321 Major depressive disorder, single episode, moderate: Secondary | ICD-10-CM

## 2017-11-29 DIAGNOSIS — F4312 Post-traumatic stress disorder, chronic: Secondary | ICD-10-CM

## 2017-11-29 NOTE — Progress Notes (Signed)
   THERAPIST PROGRESS NOTE  Session Time: 2-3  Participation Level: Active  Behavioral Response: Casual and Well GroomedAlertEuthymic  Type of Therapy: Individual Therapy  Treatment Goals addressed: Anxiety and Diagnosis: PTSD  Interventions: CBT, Strength-based and Supportive  Summary: [name] is a 37 y.o. female who presents with PTSD, depression. She is active and engaged in session. She reports she is compliant on her medications and seeing improvements in her sleep and mood. Pt states her coworkers are noticing her "be able to handle stress better and they're commenting on it to me". Pt discusses her stressful family dynamics concerning her ex boyfriend's family who "still wants pt to be a part of their family, even though he has a new girlfriend who comes around".   Pt reports she is "smiling more" and "feels better able to do her job".   "I've been dx w/ a hernia and I'm going to have to have surgery".  Suicidal/Homicidal: Nowithout intent/plan  Therapist Response: Counselor used open questions, empathy, reflection, and focusing techniques to help pt discuss her specific bx changes and reframe her problems as "opportunities for growth". Counselor assessed pt's medication compliance and overall utilization of coping skills.  Plan: Return again in 2 weeks.  Diagnosis:    ICD-10-CM   1. Chronic post-traumatic stress disorder (PTSD) F43.12   2. Major depressive disorder, single episode, moderate ([place]) F32.1       Archie Balboa, LCAS-A 11/29/2017

## 2017-11-30 ENCOUNTER — Ambulatory Visit
Admission: RE | Admit: 2017-11-30 | Discharge: 2017-11-30 | Disposition: A | Discharge status: Home / Self Care | Payer: BLUE CROSS/BLUE SHIELD | Source: Ambulatory Visit | Attending: General Surgery | Admitting: General Surgery

## 2017-11-30 DIAGNOSIS — K432 Incisional hernia without obstruction or gangrene: Secondary | ICD-10-CM

## 2017-11-30 DIAGNOSIS — K429 Umbilical hernia without obstruction or gangrene: Secondary | ICD-10-CM | POA: Diagnosis not present

## 2017-11-30 MED ORDER — IOPAMIDOL (ISOVUE-300) INJECTION 61%
100.0000 mL | Freq: Once | INTRAVENOUS | Status: AC | PRN
  Administered 2017-11-30: 100 mL via INTRAVENOUS

## 2017-12-01 ENCOUNTER — Ambulatory Visit: Payer: BLUE CROSS/BLUE SHIELD | Admitting: Obstetrics & Gynecology

## 2017-12-01 ENCOUNTER — Encounter: Payer: Self-pay | Admitting: Obstetrics & Gynecology

## 2017-12-01 VITALS — BP 136/77 | HR 73 | Ht 64.0 in | Wt 191.9 lb

## 2017-12-01 DIAGNOSIS — R102 Pelvic and perineal pain: Secondary | ICD-10-CM

## 2017-12-01 NOTE — Progress Notes (Signed)
History:  37 y.o. [id number] here today for eval of ov cyst. Pt has an [place] and a pelvic CT. She was having pain which is improved.   The following portions of the patient's history were reviewed and updated as appropriate: allergies, current medications, past family history, past medical history, past social history, past surgical history and problem list.  Review of Systems:  Pertinent items are noted in HPI.    Objective:  Physical Exam BP 136/77   Pulse 73   Ht 5\' 4"  (1.626 m)   Wt 191 lb 14.4 oz (87 kg)   BMI 32.94 kg/m  CONSTITUTIONAL: Well-developed, well-nourished female in no acute distress.  HENT:  Normocephalic, atraumatic EYES: Conjunctivae and EOM are normal. No scleral icterus.  NECK: Normal range of motion SKIN: Skin is warm and dry. No rash noted. Not diaphoretic.No pallor. [place]: Alert and oriented to person, place, and time. Normal coordination.  Abd: Soft, nontender and nondistended Pelvic: Normal appearing external genitalia; normal appearing vaginal mucosa and cervix.  Normal discharge.  Small uterus, no other palpable masses, no uterine or adnexal tenderness  Labs and Imaging Ct Abdomen Pelvis W Contrast  Result Date: 12/01/2017 CLINICAL DATA:  Umbilical hernia. History of hernia repair in 2016. Right oophorectomy and hepatic cyst removal. EXAM: CT ABDOMEN AND PELVIS WITH CONTRAST TECHNIQUE: Multidetector CT imaging of the abdomen and pelvis was performed using the standard protocol following bolus administration of intravenous contrast. CONTRAST:  110mL ISOVUE-300 IOPAMIDOL (ISOVUE-300) INJECTION 61% COMPARISON:  11/19/2015 CT FINDINGS: Lower chest: Normal size heart without pericardial effusion or thickening. Clear lung bases with minimal dependent atelectasis. Hepatobiliary: Nondistended gallbladder. No gallstones. Postop change involving the liver capsule calcifications near the dome as before. Tiny too small to characterize hypodensity in the anterior left  hepatic lobe measuring 4 mm statistically consistent with a cyst or hemangioma is seen. No biliary dilatation. Pancreas: Normal Spleen: Normal Adrenals/Urinary Tract: Normal Stomach/Bowel: Contrast distention of the stomach with normal small bowel rotation. No bowel obstruction or inflammation. Moderate fecal retention throughout the colon without inflammation or obstruction. No evidence of acute appendicitis. Vascular/Lymphatic: No significant vascular findings are present. No enlarged abdominal or pelvic lymph nodes. Reproductive: Uterus is unremarkable. Status post bilateral oophorectomy and salpingectomy. No adnexal masses. Other: Ventral mesh hernia repair without recurrent hernia noted. Overlying subcutaneous scarring. Musculoskeletal: No acute or significant osseous findings. IMPRESSION: Ventral mesh hernia repair without recurrence of hernia. Postop appearance of the right hepatic capsule with calcifications status post excision of prior fat containing lesion. Electronically Signed   By: Ashley Royalty M.D.   On: 12/01/2017 02:45   11/01/2017 CLINICAL DATA:  Patient with pelvic pain for 2 months. History of right salpingo-oophorectomy.  EXAM: TRANSABDOMINAL ULTRASOUND OF PELVIS  TECHNIQUE: Transabdominal ultrasound examination of the pelvis was performed including evaluation of the uterus, ovaries, adnexal regions, and pelvic cul-de-sac.  COMPARISON:  CT abdomen pelvis 11/19/2015  FINDINGS: Uterus  Measurements: 7.3 x 4.2 x 4.3 cm. No fibroids or other mass visualized.  Endometrium  Thickness: 3 mm.  No focal abnormality visualized.  Right ovary  Surgically absent.  Left ovary  Measurements: 2.9 x 1.7 x 2.4 cm. Normal appearance/no adnexal mass.  Other findings:  No abnormal free fluid.  IMPRESSION: No acute process within the pelvis. Assessment & Plan:  Pelvic pain  Reviewed results of pelvic US and CT  No obvious GYN pathology identified.    All questions  answered  F/u 07/2018 or sooner prn  Toneka Fullen L. Harraway-Smith, M.D.,  FACOG

## 2017-12-04 ENCOUNTER — Other Ambulatory Visit: Payer: Self-pay | Admitting: Infectious Diseases

## 2017-12-04 DIAGNOSIS — J45991 Cough variant asthma: Secondary | ICD-10-CM

## 2017-12-05 ENCOUNTER — Encounter (HOSPITAL_COMMUNITY): Payer: Self-pay | Admitting: Licensed Clinical Social Worker

## 2017-12-12 ENCOUNTER — Other Ambulatory Visit: Payer: BLUE CROSS/BLUE SHIELD

## 2017-12-12 DIAGNOSIS — Z113 Encounter for screening for infections with a predominantly sexual mode of transmission: Secondary | ICD-10-CM | POA: Diagnosis not present

## 2017-12-12 DIAGNOSIS — B20 Human immunodeficiency virus [HIV] disease: Secondary | ICD-10-CM | POA: Diagnosis not present

## 2017-12-12 LAB — CBC
HCT: 40.3 % (ref 35.0–45.0)
Hemoglobin: 13.8 g/dL (ref 11.7–15.5)
MCH: 31.9 pg (ref 27.0–33.0)
MCHC: 34.2 g/dL (ref 32.0–36.0)
MCV: 93.3 fL (ref 80.0–100.0)
MPV: 10.2 fL (ref 7.5–12.5)
Platelets: 252 10*3/uL (ref 140–400)
RBC: 4.32 10*6/uL (ref 3.80–5.10)
RDW: 12.7 % (ref 11.0–15.0)
WBC: 8.3 10*3/uL (ref 3.8–10.8)

## 2017-12-13 ENCOUNTER — Ambulatory Visit (HOSPITAL_COMMUNITY): Payer: Self-pay | Admitting: Licensed Clinical Social Worker

## 2017-12-13 LAB — T-HELPER CELL (CD4) - (RCID CLINIC ONLY)
CD4 T CELL ABS: 970 /uL (ref 400–2700)
CD4 T CELL HELPER: 37 % (ref 33–55)

## 2017-12-13 LAB — RPR: RPR: NONREACTIVE

## 2017-12-14 LAB — HIV-1 RNA QUANT-NO REFLEX-BLD
HIV 1 RNA Quant: <20 copies/mL
HIV-1 RNA QUANT, LOG: NOT DETECTED {Log_copies}/mL

## 2017-12-19 ENCOUNTER — Other Ambulatory Visit: Payer: Self-pay

## 2017-12-21 DIAGNOSIS — R109 Unspecified abdominal pain: Secondary | ICD-10-CM | POA: Diagnosis not present

## 2017-12-27 ENCOUNTER — Ambulatory Visit (INDEPENDENT_AMBULATORY_CARE_PROVIDER_SITE_OTHER): Payer: BLUE CROSS/BLUE SHIELD | Admitting: Licensed Clinical Social Worker

## 2017-12-27 DIAGNOSIS — F4312 Post-traumatic stress disorder, chronic: Secondary | ICD-10-CM

## 2017-12-27 DIAGNOSIS — F321 Major depressive disorder, single episode, moderate: Secondary | ICD-10-CM | POA: Diagnosis not present

## 2017-12-29 ENCOUNTER — Encounter (HOSPITAL_COMMUNITY): Payer: Self-pay | Admitting: Licensed Clinical Social Worker

## 2017-12-29 NOTE — Progress Notes (Signed)
   THERAPIST PROGRESS NOTE  Session Time: 2-3  Participation Level: Active  Behavioral Response: Casual and NeatAlertEuthymic  Type of Therapy: Individual Therapy  Treatment Goals addressed: Diagnosis: PTSD, MDD  Interventions: CBT, Strength-based and Supportive  Summary: [name] is a 37 y.o. female who presents with PTSD and MDD. She is active and engaged in session and states she has "little to talk about today". Pt then discusses problems at her job and interpersonal differences she has w/ other coworkers. She reports she feels the medicine is working well and she is noticing she is not letting things bother her as much as before medications.   Suicidal/Homicidal: Nowithout intent/plan  Therapist Response: Counselor used open questions, focusing techniques to help pt focus her internal locus of control, supportive feedback.  Plan: Return again in 2 weeks.  Diagnosis:    ICD-10-CM   1. Chronic post-traumatic stress disorder (PTSD) F43.12   2. Major depressive disorder, single episode, moderate ([place]) F32.1      Archie Balboa, LCAS-A 12/29/2017

## 2017-12-30 ENCOUNTER — Ambulatory Visit: Payer: Self-pay | Admitting: Nurse Practitioner

## 2018-01-02 ENCOUNTER — Other Ambulatory Visit: Payer: Self-pay

## 2018-01-02 ENCOUNTER — Other Ambulatory Visit (HOSPITAL_COMMUNITY)
Admission: RE | Admit: 2018-01-02 | Discharge: 2018-01-02 | Disposition: A | Discharge status: Home / Self Care | Payer: BLUE CROSS/BLUE SHIELD | Source: Ambulatory Visit | Attending: Infectious Diseases | Admitting: Infectious Diseases

## 2018-01-02 ENCOUNTER — Ambulatory Visit (INDEPENDENT_AMBULATORY_CARE_PROVIDER_SITE_OTHER): Payer: BLUE CROSS/BLUE SHIELD | Admitting: Infectious Diseases

## 2018-01-02 ENCOUNTER — Encounter: Payer: Self-pay | Admitting: Infectious Diseases

## 2018-01-02 VITALS — Temp 98.3°F | Ht 64.0 in | Wt 192.0 lb

## 2018-01-02 DIAGNOSIS — M940 Chondrocostal junction syndrome [Tietze]: Secondary | ICD-10-CM

## 2018-01-02 DIAGNOSIS — Z113 Encounter for screening for infections with a predominantly sexual mode of transmission: Secondary | ICD-10-CM | POA: Insufficient documentation

## 2018-01-02 DIAGNOSIS — R87612 Low grade squamous intraepithelial lesion on cytologic smear of cervix (LGSIL): Secondary | ICD-10-CM

## 2018-01-02 DIAGNOSIS — B20 Human immunodeficiency virus [HIV] disease: Secondary | ICD-10-CM

## 2018-01-02 DIAGNOSIS — E785 Hyperlipidemia, unspecified: Secondary | ICD-10-CM | POA: Diagnosis not present

## 2018-01-02 MED ORDER — NAPROXEN 500 MG PO TBEC: 500.0000 mg | DELAYED_RELEASE_TABLET | Freq: Two times a day (BID) | ORAL | 1 refills | Status: DC

## 2018-01-02 NOTE — Assessment & Plan Note (Signed)
Will start her on high dose naprosyn, take with food.

## 2018-01-02 NOTE — Assessment & Plan Note (Signed)
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  Will defer her prevnar to next visit as she is uncomfortable today.  rtc in 6 months

## 2018-01-02 NOTE — Addendum Note (Signed)
Addended by: Dolan Amen D on: 01/02/2018 03:46 PM   Modules accepted: Orders

## 2018-01-02 NOTE — Assessment & Plan Note (Signed)
We checked her ASCVD risk calculator. At 2.9% she would not currently qualify for statin.  We discussed this.

## 2018-01-02 NOTE — Progress Notes (Signed)
   Subjective:    Patient ID: [name], female    DOB: Sep 08, 1980, 37 y.o.   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 Normal PAP 07-2017.  Had nexplanon cahnged 11-2017 after gaining weight.  Has been having lumbar back pain for last 2 weeks. She got sent home from work this AM. Has tried meloxicam, Tizanadine, ibuprofen, ES tylenol. Does not have heating pad (has used hot rag).  No recent hx of injury, no heavy lifting.   HIV 1 RNA Quant (copies/mL)  Date Value  12/12/2017 <20 NOT DETECTED  04/19/2017 21 (H)  04/06/2017 <20 DETECTED (A)   CD4 T Cell Abs (/uL)  Date Value  12/12/2017 970  04/19/2017 1,130  04/06/2017 970   ASCVD risk calculates as 2.9%.   Review of Systems  Constitutional: Negative for chills and fever.  Respiratory: Negative for cough and shortness of breath.   Gastrointestinal: Negative for constipation and diarrhea.  Genitourinary: Negative for difficulty urinating and menstrual problem.  Musculoskeletal: Positive for back pain.  tingling in her R > L hand, feels like it is going numb. Intermittent.  Please see HPI. All other systems reviewed and negative.     Objective:   Physical Exam  Constitutional: She is oriented to person, place, and time. She appears well-developed and well-nourished. She appears distressed.  HENT:  Head: Normocephalic and atraumatic.  Mouth/Throat: No oropharyngeal exudate.  Eyes: Pupils are equal, round, and reactive to light. EOM are normal.  Neck: Normal range of motion. Neck supple.  Cardiovascular: Normal rate, regular rhythm and normal heart sounds.  Pulmonary/Chest: Effort normal and breath sounds normal.  Abdominal: Soft. Bowel sounds are normal. There is no tenderness.  Musculoskeletal: Normal range of motion. She exhibits no edema.       Arms: Lymphadenopathy:    She has no cervical adenopathy.  Neurological: She is alert and oriented to person, place, and time. No sensory  deficit. She exhibits normal muscle tone.  Psychiatric: She has a normal mood and affect.      Assessment & Plan:

## 2018-01-02 NOTE — Assessment & Plan Note (Signed)
Last PAP (-) Continue yearly f/u.

## 2018-01-03 LAB — URINE CYTOLOGY ANCILLARY ONLY
Chlamydia: NEGATIVE
Neisseria Gonorrhea: NEGATIVE

## 2018-01-05 ENCOUNTER — Ambulatory Visit (HOSPITAL_COMMUNITY): Payer: Self-pay | Admitting: Psychiatry

## 2018-01-10 ENCOUNTER — Ambulatory Visit (INDEPENDENT_AMBULATORY_CARE_PROVIDER_SITE_OTHER): Payer: BLUE CROSS/BLUE SHIELD | Admitting: Licensed Clinical Social Worker

## 2018-01-10 ENCOUNTER — Other Ambulatory Visit (HOSPITAL_COMMUNITY): Payer: Self-pay | Admitting: Psychiatry

## 2018-01-10 ENCOUNTER — Encounter (HOSPITAL_COMMUNITY): Payer: Self-pay | Admitting: Psychiatry

## 2018-01-10 ENCOUNTER — Encounter: Payer: Self-pay | Admitting: Infectious Diseases

## 2018-01-10 DIAGNOSIS — F321 Major depressive disorder, single episode, moderate: Secondary | ICD-10-CM

## 2018-01-10 DIAGNOSIS — F4312 Post-traumatic stress disorder, chronic: Secondary | ICD-10-CM

## 2018-01-10 DIAGNOSIS — R4586 Emotional lability: Secondary | ICD-10-CM

## 2018-01-10 MED ORDER — CITALOPRAM HYDROBROMIDE 20 MG PO TABS: 40.0000 mg | ORAL_TABLET | Freq: Every day | ORAL | 1 refills | Status: DC

## 2018-01-13 ENCOUNTER — Encounter (HOSPITAL_COMMUNITY): Payer: Self-pay | Admitting: Licensed Clinical Social Worker

## 2018-01-13 NOTE — Progress Notes (Signed)
   THERAPIST PROGRESS NOTE  Session Time: 3-4  Participation Level: Active  Behavioral Response: Neat and Well GroomedAlertEuthymic  Type of Therapy: Individual Therapy  Treatment Goals addressed: Coping  Interventions: CBT, Solution Focused, Strength-based and Supportive  Summary: [name] is a 37 y.o. female who presents with PTSD w/ depressive features. She reports her past 2 weeks have been "steady" and she is practicing new coping skills, such as ignoring her coworkers behaviors that do not impact her direct performance. She continues to struggle w/ regret and sadness that her work environment is not supportive of her mental health, though she expresses better internal locus of control to manage her own sxs while in distress. She states her sleep is good though she continues to feel tired most days. Counselor mentioned exercise and diet as possible contributing factors, which pt agreed but was not interested in changing at this time. Counselor spent time asking pt to verbalize the changes she has made since starting therapy and medication. Pt states medications "seem to be working well".  Suicidal/Homicidal: Nowithout intent/plan  Therapist Response: Counselor used open questions, empathy, reflection of content, and active listening. Counselor used validation and genuine interest to increase pt sense of self esteem and internal locus of control. Counselor challenged pt's negative thinking and discussed assertiveness skills for communicating w/ her coworkers.  Plan: Return again in 2 weeks.  Diagnosis:    ICD-10-CM   1. Chronic post-traumatic stress disorder (PTSD) F43.12        Archie Balboa, LCAS-A 01/13/2018

## 2018-01-23 ENCOUNTER — Ambulatory Visit (INDEPENDENT_AMBULATORY_CARE_PROVIDER_SITE_OTHER): Payer: BLUE CROSS/BLUE SHIELD | Admitting: Family Medicine

## 2018-01-23 VITALS — BP 116/74 | HR 72 | Temp 98.2°F | Ht 64.0 in | Wt 194.0 lb

## 2018-01-23 DIAGNOSIS — M940 Chondrocostal junction syndrome [Tietze]: Secondary | ICD-10-CM

## 2018-01-23 DIAGNOSIS — H6690 Otitis media, unspecified, unspecified ear: Secondary | ICD-10-CM

## 2018-01-23 DIAGNOSIS — M549 Dorsalgia, unspecified: Secondary | ICD-10-CM | POA: Diagnosis not present

## 2018-01-23 DIAGNOSIS — J45991 Cough variant asthma: Secondary | ICD-10-CM

## 2018-01-23 DIAGNOSIS — G4700 Insomnia, unspecified: Secondary | ICD-10-CM

## 2018-01-23 DIAGNOSIS — Z09 Encounter for follow-up examination after completed treatment for conditions other than malignant neoplasm: Secondary | ICD-10-CM

## 2018-01-23 LAB — POCT URINALYSIS DIP (MANUAL ENTRY)
Bilirubin, UA: NEGATIVE
Blood, UA: NEGATIVE
Glucose, UA: NEGATIVE mg/dL
Ketones, POC UA: NEGATIVE mg/dL
Leukocytes, UA: NEGATIVE
Nitrite, UA: NEGATIVE
Protein Ur, POC: 30 mg/dL — AB
Spec Grav, UA: >=1.03 — AB (ref 1.010–1.025)
Urobilinogen, UA: 1 E.U./dL
pH, UA: 5.5 (ref 5.0–8.0)

## 2018-01-23 MED ORDER — ALBUTEROL SULFATE HFA 108 (90 BASE) MCG/ACT IN AERS: INHALATION_SPRAY | RESPIRATORY_TRACT | 3 refills | Status: DC

## 2018-01-23 MED ORDER — NAPROXEN 500 MG PO TBEC: 500.0000 mg | DELAYED_RELEASE_TABLET | Freq: Two times a day (BID) | ORAL | 1 refills | Status: DC

## 2018-01-23 MED ORDER — MELOXICAM 15 MG PO TABS
15.0000 mg | ORAL_TABLET | Freq: Every day | ORAL | 0 refills | Status: DC
Start: 2018-01-23 — End: 2018-09-21

## 2018-01-23 MED ORDER — AMOXICILLIN-POT CLAVULANATE 875-125 MG PO TABS: 1.0000 | ORAL_TABLET | Freq: Two times a day (BID) | ORAL | 0 refills | Status: DC

## 2018-01-23 MED ORDER — TRAMADOL HCL 50 MG PO TABS
50.0000 mg | ORAL_TABLET | Freq: Three times a day (TID) | ORAL | 0 refills | Status: DC | PRN
Start: 2018-01-23 — End: 2018-07-10

## 2018-01-23 MED ORDER — CETIRIZINE HCL 10 MG PO TABS: 20.0000 mg | ORAL_TABLET | Freq: Every day | ORAL | 0 refills | Status: DC

## 2018-01-23 MED ORDER — BECLOMETHASONE DIPROPIONATE 40 MCG/ACT IN AERS: INHALATION_SPRAY | RESPIRATORY_TRACT | 3 refills | Status: DC

## 2018-01-23 NOTE — Progress Notes (Signed)
Subjective:    Patient ID: [name], female    DOB: [birth year], 37 y.o.   MRN: [id number]   PCP: Kathe Becton, NP  Chief Complaint  Patient presents with  . Fatigue  . Back Pain    HPI  [name] has history of Asthma, Type 2 Diabetes, HIV, Headaches, GERD, Depression, Bipolar, Anemia, and Anxiety. She is here today for right ear pain and general weakness.   Current Status: She states that she has had right ear pain for 2 weeks now. She denies hearing loss, ear drainage, ring in ears, headaches, visual changes, dizziness, unsteadiness, and falls.   She reports that she has been very fatigued lately. She denies fevers, chills, recent infections, weight loss, and night sweats. She denies cough, heart palpitations, shortness of breath, and chest pain.   She has a good appetite, and normal bowel movements. Denies abdominal pain, nausea, vomiting, diarrhea, and constipation. She denies blood in her stools, dysuria, and hematuria.   She reports increase fatigue, weakness, sluggishness, and feeling drained. She states that she has not been able to sleep lately. Denies anxiety and depression.   Denies back pain.   Past Medical History:  Diagnosis Date  . Allergy   . Anemia   . Anxiety   . Anxiety state, unspecified 12/07/2012  . Arthritis   . Asthma   . Bipolar disorder, unspecified ([place]) 12/07/2012  . Depression   . Diabetes mellitus without complication ([place])    no meds currently- controlled with diet  . GERD (gastroesophageal reflux disease)   . GERD (gastroesophageal reflux disease) 12/07/2012  . Headache(784.0)    migraines  . Herpes genitalis in women 12/07/2012  . HIV infection ([place])   . Human immunodeficiency virus (HIV) disease ([place]) 12/07/2012  . Neuromuscular disorder ([place])    carpal tunnel in both arms  . Type II or unspecified type diabetes mellitus without mention of complication, not stated as uncontrolled 12/07/2012  . Unspecified asthma(493.90)  12/07/2012    Family History  Problem Relation Age of Onset  . Arthritis/Rheumatoid Mother   . Hypertension Mother   . Cancer Maternal Aunt        brand and lung  . Cancer Maternal Grandmother        breasts  . Cancer Paternal Grandmother        breast and kidney    Social History   Socioeconomic History  . Marital status: Single    Spouse name: Not on file  . Number of children: 1  . Years of education: 6  . Highest education level: Not on file  Occupational History  . Not on file  Social Needs  . Financial resource strain: Not on file  . Food insecurity:    Worry: Not on file    Inability: Not on file  . Transportation needs:    Medical: Not on file    Non-medical: Not on file  Tobacco Use  . Smoking status: Never Smoker  . Smokeless tobacco: Never Used  Substance and Sexual Activity  . Alcohol use: No    Alcohol/week: 0.0 oz    Comment: OCC.about once a month   . Drug use: No  . Sexual activity: Never    Partners: Male    Birth control/protection: Implant  Lifestyle  . Physical activity:    Days per week: Not on file    Minutes per session: Not on file  . Stress: Not on file  Relationships  . Social connections:  Talks on phone: Not on file    Gets together: Not on file    Attends religious service: Not on file    Active member of club or organization: Not on file    Attends meetings of clubs or organizations: Not on file    Relationship status: Not on file  . Intimate partner violence:    Fear of current or ex partner: Not on file    Emotionally abused: Not on file    Physically abused: Not on file    Forced sexual activity: Not on file  Other Topics Concern  . Not on file  Social History Narrative   Patient lives alone.   Patient works at home.   Patient has hs education.   Patient has 1 child.   Patient drinks caffeine once in a while.    Past Surgical History:  Procedure Laterality Date  . CESAREAN SECTION    . COLON SURGERY      colon repair after salpingo  . INSERTION OF MESH N/A 11/06/2014   Procedure: INSERTION OF MESH;  Surgeon: Stark Klein, MD;  Location: [facility];  Service: General;  Laterality: N/A;  . LAPAROSCOPIC HEPATECTOMY N/A 06/28/2013   Procedure: LAPAROSCOPIC EXCISION HEPATIC MASS;  Surgeon: Stark Klein, MD;  Location: [facility];  Service: General;  Laterality: N/A;  . LAPAROSCOPIC LYSIS OF ADHESIONS N/A 12/04/2012   Procedure: LAPAROSCOPIC LYSIS OF ADHESIONS;  Surgeon: [name], MD;  Location: [facility];  Service: Gynecology;  Laterality: N/A;  . LAPAROTOMY N/A 12/04/2012   Procedure: EXPLORATORY LAPAROTOMY;  Surgeon: [name], MD;  Location: [facility];  Service: Gynecology;  Laterality: N/A;  repair of serosal injury  . ROBOTIC ASSISTED BILATERAL SALPINGO OOPHERECTOMY Right 12/04/2012   Procedure: ROBOTIC ASSISTED Right  SALPINGO OOPHORECTOMY;  Surgeon: [name], MD;  Location: [facility];  Service: Gynecology;  Laterality: Right;  . VENTRAL HERNIA REPAIR N/A 11/06/2014   Procedure: LAPAROSCOPIC ASSITED INCARCERATED VENTRAL HERNIA REPAIR WITH MESH;  Surgeon: Stark Klein, MD;  Location: [facility];  Service: General;  Laterality: N/A;    Immunization History  Administered Date(s) Administered  . Hepatitis A, Adult 08/03/2017  . Hepatitis B 03/20/2008, 09/25/2008, 06/03/2009  . Hepatitis B, adult 06/19/2014, 08/05/2014, 01/15/2015  . Influenza Split 08/02/2012  . Influenza Whole 06/13/2008, 06/03/2009, 06/01/2010  . Influenza,inj,Quad PF,6+ Mos 05/03/2013, 06/12/2014, 04/26/2016, 05/31/2017  . Meningococcal Mcv4o 02/07/2017  . PPD Test 05/31/2014  . Pneumococcal Polysaccharide-23 03/20/2008, 05/03/2013  . Tdap 08/02/2012    Current Meds  Medication Sig  . albuterol (PROAIR HFA) 108 (90 Base) MCG/ACT inhaler INHALE TWO PUFFS INTO THE LUNGS EVERY 6 HOURS AS NEEDED FOR WHEEZING.  . benzonatate (TESSALON) 100 MG capsule Take 1-2 capsules (100-200 mg total) by mouth 3 (three) times daily as  needed for cough.  . bictegravir-emtricitabine-tenofovir AF (BIKTARVY) 50-200-25 MG TABS tablet Take 1 tablet by mouth daily.  . cetirizine (ZYRTEC) 10 MG tablet Take 2 tablets (20 mg total) by mouth at bedtime.  . citalopram (CELEXA) 20 MG tablet Take 2 tablets (40 mg total) by mouth daily.  . meloxicam (MOBIC) 15 MG tablet Take 1 tablet (15 mg total) by mouth daily.  . naproxen (EC NAPROSYN) 500 MG EC tablet Take 1 tablet (500 mg total) by mouth 2 (two) times daily with a meal.  . oxymetazoline (AFRIN) 0.05 % nasal spray Place 1 spray into both nostrils 2 (two) times daily. For <7 days at a time.  . ranitidine (ZANTAC) 150 MG tablet Take 1  tablet (150 mg total) by mouth 2 (two) times daily.  Marland Kitchen tiZANidine (ZANAFLEX) 4 MG capsule Take 1 capsule (4 mg total) by mouth 3 (three) times daily as needed for muscle spasms.  . traZODone (DESYREL) 100 MG tablet Take 1 tablet (100 mg total) by mouth at bedtime. Take 1-2 tablets for sleep  . valACYclovir (VALTREX) 500 MG tablet TAKE 1 TABLET (500 MG TOTAL) BY MOUTH DAILY.  . [DISCONTINUED] albuterol (PROAIR HFA) 108 (90 Base) MCG/ACT inhaler INHALE TWO PUFFS INTO THE LUNGS EVERY 6 HOURS AS NEEDED FOR WHEEZING.  . [DISCONTINUED] cetirizine (ZYRTEC) 10 MG tablet Take 2 tablets (20 mg total) by mouth at bedtime.  . [DISCONTINUED] meloxicam (MOBIC) 15 MG tablet Take 1 tablet (15 mg total) by mouth daily.  . [DISCONTINUED] naproxen (EC NAPROSYN) 500 MG EC tablet Take 1 tablet (500 mg total) by mouth 2 (two) times daily with a meal.    Allergies  Allergen Reactions  . Anette Guarneri [Lurasidone Hcl] Other (See Comments)    Occular dystonia  . Seroquel [Quetiapine Fumarate] Swelling    Tongue swells   . Benzoin Other (See Comments)    blisters  . Latex Rash and Other (See Comments)  . Sulfamethoxazole-Trimethoprim Itching and Nausea And Vomiting    BP 116/74 (BP Location: Left Arm, Patient Position: Sitting, Cuff Size: Large)   Pulse 72   Temp 98.2 F (36.8 C)  (Oral)   Ht 5\' 4"  (1.626 m)   Wt 194 lb (88 kg)   LMP  (LMP Unknown)   SpO2 99%   BMI 33.30 kg/m    Review of Systems  Constitutional: Negative.   HENT: Negative.   Eyes: Negative.   Respiratory: Negative.   Cardiovascular: Negative.   Gastrointestinal: Negative.   Endocrine: Negative.   Genitourinary: Negative.   Musculoskeletal: Negative.   Skin: Negative.   Allergic/Immunologic: Negative.   Neurological: Negative.   Hematological: Negative.   Psychiatric/Behavioral: Negative.    Objective:   Physical Exam  Constitutional: She is oriented to person, place, and time. She appears well-developed and well-nourished.  HENT:  Head: Normocephalic and atraumatic.  Right Ear: External ear normal.  Left Ear: External ear normal.  Ears:  Nose: Nose normal.  Mouth/Throat: Oropharynx is clear and moist.  Eyes: Pupils are equal, round, and reactive to light. Conjunctivae and EOM are normal.  Neck: Normal range of motion. Neck supple.  Cardiovascular: Normal rate, regular rhythm, normal heart sounds and intact distal pulses.  Pulmonary/Chest: Effort normal and breath sounds normal.  Abdominal: Soft. Bowel sounds are normal. She exhibits distension.  Musculoskeletal: Normal range of motion.  Neurological: She is alert and oriented to person, place, and time.  Skin: Skin is dry. Capillary refill takes less than 2 seconds.  Psychiatric: She has a normal mood and affect. Her behavior is normal. Judgment and thought content normal.  Nursing note and vitals reviewed.  Assessment & Plan:   1. Back pain, unspecified back location, unspecified back pain laterality, unspecified chronicity - POCT urinalysis dipstick - traMADol (ULTRAM) 50 MG tablet; Take 1 tablet (50 mg total) by mouth every 8 (eight) hours as needed.  Dispense: 30 tablet; Refill: 0  2. Insomnia, unspecified type She will continue to take Celexa as directed at bedtime. Because of her recent onset of increase weakness,  sluggishness, fatigue, and feeling drained.  - Ambulatory referral to Sleep Studies  3. Costochondritis, acute - naproxen (EC NAPROSYN) 500 MG EC tablet; Take 1 tablet (500 mg total) by mouth 2 (  two) times daily with a meal.  Dispense: 28 tablet; Refill: 1  4. ASTHMA, COUGH VARIANT - albuterol (PROAIR HFA) 108 (90 Base) MCG/ACT inhaler; INHALE TWO PUFFS INTO THE LUNGS EVERY 6 HOURS AS NEEDED FOR WHEEZING.  Dispense: 8.5 Inhaler; Refill: 3  5. Asthma, cough variant - beclomethasone (QVAR) 40 MCG/ACT inhaler; INHALE TWO PUFFS INTO THE LUNGS TWO TIMES DAILY.  Dispense: 8.7 Inhaler; Refill: 3  6. Ear infection Right middle ear pain for 2 weeks.  - amoxicillin-clavulanate (AUGMENTIN) 875-125 MG tablet; Take 1 tablet by mouth 2 (two) times daily.  Dispense: 20 tablet; Refill: 0  7. Follow up Please keep scheduled follow up appointment for 02/15/2018.  Meds ordered this encounter  Medications  . cetirizine (ZYRTEC) 10 MG tablet    Sig: Take 2 tablets (20 mg total) by mouth at bedtime.    Dispense:  60 tablet    Refill:  0  . meloxicam (MOBIC) 15 MG tablet    Sig: Take 1 tablet (15 mg total) by mouth daily.    Dispense:  30 tablet    Refill:  0  . naproxen (EC NAPROSYN) 500 MG EC tablet    Sig: Take 1 tablet (500 mg total) by mouth 2 (two) times daily with a meal.    Dispense:  28 tablet    Refill:  1  . albuterol (PROAIR HFA) 108 (90 Base) MCG/ACT inhaler    Sig: INHALE TWO PUFFS INTO THE LUNGS EVERY 6 HOURS AS NEEDED FOR WHEEZING.    Dispense:  8.5 Inhaler    Refill:  3  . traMADol (ULTRAM) 50 MG tablet    Sig: Take 1 tablet (50 mg total) by mouth every 8 (eight) hours as needed.    Dispense:  30 tablet    Refill:  0    Order Specific Question:   Supervising Provider    Answer:   Tresa Garter [id number]  . beclomethasone (QVAR) 40 MCG/ACT inhaler    Sig: INHALE TWO PUFFS INTO THE LUNGS TWO TIMES DAILY.    Dispense:  8.7 Inhaler    Refill:  3  . amoxicillin-clavulanate  (AUGMENTIN) 875-125 MG tablet    Sig: Take 1 tablet by mouth 2 (two) times daily.    Dispense:  20 tablet    Refill:  0    Kathe Becton,  MSN, FNP-BC Patient [place] [address] [place], [place] [address] [phone]

## 2018-01-24 ENCOUNTER — Ambulatory Visit (INDEPENDENT_AMBULATORY_CARE_PROVIDER_SITE_OTHER): Payer: BLUE CROSS/BLUE SHIELD | Admitting: Licensed Clinical Social Worker

## 2018-01-24 DIAGNOSIS — F4312 Post-traumatic stress disorder, chronic: Secondary | ICD-10-CM

## 2018-01-26 NOTE — Progress Notes (Signed)
Pt did not show for appointment.  Youlanda Roys, LPCA

## 2018-01-31 ENCOUNTER — Encounter (HOSPITAL_COMMUNITY): Payer: Self-pay | Admitting: Psychiatry

## 2018-01-31 ENCOUNTER — Telehealth: Payer: Self-pay

## 2018-01-31 ENCOUNTER — Ambulatory Visit (INDEPENDENT_AMBULATORY_CARE_PROVIDER_SITE_OTHER): Payer: BLUE CROSS/BLUE SHIELD | Admitting: Psychiatry

## 2018-01-31 DIAGNOSIS — F0630 Mood disorder due to known physiological condition, unspecified: Secondary | ICD-10-CM | POA: Diagnosis not present

## 2018-01-31 DIAGNOSIS — F4312 Post-traumatic stress disorder, chronic: Secondary | ICD-10-CM

## 2018-01-31 DIAGNOSIS — R4586 Emotional lability: Secondary | ICD-10-CM

## 2018-01-31 DIAGNOSIS — F321 Major depressive disorder, single episode, moderate: Secondary | ICD-10-CM | POA: Diagnosis not present

## 2018-01-31 MED ORDER — CITALOPRAM HYDROBROMIDE 40 MG PO TABS: 40.0000 mg | ORAL_TABLET | Freq: Every day | ORAL | 0 refills | Status: DC

## 2018-01-31 MED ORDER — TRAZODONE HCL 100 MG PO TABS: 100.0000 mg | ORAL_TABLET | Freq: Every day | ORAL | 1 refills | Status: DC

## 2018-01-31 MED ORDER — BUPROPION HCL ER (SR) 100 MG PO TB12: 100.0000 mg | ORAL_TABLET | Freq: Every day | ORAL | 0 refills | Status: DC

## 2018-01-31 NOTE — Progress Notes (Signed)
BH MD/PA/NP OP Progress Note  01/31/2018 2:22 PM [name]  MRN:  [id number]  Chief Complaint:  less depressed but tired  HPI: [name] reports mood/depression has been slightly better, but she continues with fatigue. She is having sleep study per pcp to assess for sleep apnea. No acute si, hi, avh. Sleeps okay with trazodone but snores heavily.  We discussed augmentation of celexa with wellbutrin 100 mg sr daily. Confirms no history of seizures.  Will rtc in 6-8 weeks to follow up on progress and response.   Visit Diagnosis:    ICD-10-CM   1. Chronic post-traumatic stress disorder (PTSD) F43.12 citalopram (CELEXA) 40 MG tablet    buPROPion (WELLBUTRIN SR) 100 MG 12 hr tablet  2. Major depressive disorder, single episode, moderate (HCC) F32.1 citalopram (CELEXA) 40 MG tablet    traZODone (DESYREL) 100 MG tablet    buPROPion (WELLBUTRIN SR) 100 MG 12 hr tablet  3. Emotional lability R45.86 citalopram (CELEXA) 40 MG tablet  4. Mood disorder in conditions classified elsewhere F06.30 traZODone (DESYREL) 100 MG tablet    buPROPion (WELLBUTRIN SR) 100 MG 12 hr tablet    Past Psychiatric History: See intake H&P for full details. Reviewed, with no updates at this time.  Past Medical History:  Past Medical History:  Diagnosis Date  . Allergy   . Anemia   . Anxiety   . Anxiety state, unspecified 12/07/2012  . Arthritis   . Asthma   . Bipolar disorder, unspecified ([place]) 12/07/2012  . Depression   . Diabetes mellitus without complication ([place])    no meds currently- controlled with diet  . GERD (gastroesophageal reflux disease)   . GERD (gastroesophageal reflux disease) 12/07/2012  . Headache(784.0)    migraines  . Herpes genitalis in women 12/07/2012  . HIV infection ([place])   . Human immunodeficiency virus (HIV) disease ([place]) 12/07/2012  . Neuromuscular disorder ([place])    carpal tunnel in both arms  . Type II or unspecified type diabetes mellitus without mention of  complication, not stated as uncontrolled 12/07/2012  . Unspecified asthma(493.90) 12/07/2012    Past Surgical History:  Procedure Laterality Date  . CESAREAN SECTION    . COLON SURGERY     colon repair after salpingo  . INSERTION OF MESH N/A 11/06/2014   Procedure: INSERTION OF MESH;  Surgeon: Stark Klein, MD;  Location: [facility];  Service: General;  Laterality: N/A;  . LAPAROSCOPIC HEPATECTOMY N/A 06/28/2013   Procedure: LAPAROSCOPIC EXCISION HEPATIC MASS;  Surgeon: Stark Klein, MD;  Location: [facility];  Service: General;  Laterality: N/A;  . LAPAROSCOPIC LYSIS OF ADHESIONS N/A 12/04/2012   Procedure: LAPAROSCOPIC LYSIS OF ADHESIONS;  Surgeon: [name], MD;  Location: [facility];  Service: Gynecology;  Laterality: N/A;  . LAPAROTOMY N/A 12/04/2012   Procedure: EXPLORATORY LAPAROTOMY;  Surgeon: [name], MD;  Location: [facility];  Service: Gynecology;  Laterality: N/A;  repair of serosal injury  . ROBOTIC ASSISTED BILATERAL SALPINGO OOPHERECTOMY Right 12/04/2012   Procedure: ROBOTIC ASSISTED Right  SALPINGO OOPHORECTOMY;  Surgeon: [name], MD;  Location: [facility];  Service: Gynecology;  Laterality: Right;  . VENTRAL HERNIA REPAIR N/A 11/06/2014   Procedure: LAPAROSCOPIC ASSITED INCARCERATED VENTRAL HERNIA REPAIR WITH MESH;  Surgeon: Stark Klein, MD;  Location: [facility];  Service: General;  Laterality: N/A;    Family Psychiatric History: See intake H&P for full details. Reviewed, with no updates at this time.   Family History:  Family History  Problem Relation Age of  Onset  . Arthritis/Rheumatoid Mother   . Hypertension Mother   . Cancer Maternal Aunt        brand and lung  . Cancer Maternal Grandmother        breasts  . Cancer Paternal Grandmother        breast and kidney    Social History:  Social History   Socioeconomic History  . Marital status: Single    Spouse name: Not on file  . Number of children: 1  . Years of education: 14  . Highest education level:  Not on file  Occupational History  . Not on file  Social Needs  . Financial resource strain: Not on file  . Food insecurity:    Worry: Not on file    Inability: Not on file  . Transportation needs:    Medical: Not on file    Non-medical: Not on file  Tobacco Use  . Smoking status: Never Smoker  . Smokeless tobacco: Never Used  Substance and Sexual Activity  . Alcohol use: No    Alcohol/week: 0.0 oz    Comment: OCC.about once a month   . Drug use: No  . Sexual activity: Never    Partners: Male    Birth control/protection: Implant  Lifestyle  . Physical activity:    Days per week: Not on file    Minutes per session: Not on file  . Stress: Not on file  Relationships  . Social connections:    Talks on phone: Not on file    Gets together: Not on file    Attends religious service: Not on file    Active member of club or organization: Not on file    Attends meetings of clubs or organizations: Not on file    Relationship status: Not on file  Other Topics Concern  . Not on file  Social History Narrative   Patient lives alone.   Patient works at home.   Patient has hs education.   Patient has 1 child.   Patient drinks caffeine once in a while.    Allergies:  Allergies  Allergen Reactions  . Anette Guarneri [Lurasidone Hcl] Other (See Comments)    Occular dystonia  . Seroquel [Quetiapine Fumarate] Swelling    Tongue swells   . Benzoin Other (See Comments)    blisters  . Latex Rash and Other (See Comments)  . Sulfamethoxazole-Trimethoprim Itching and Nausea And Vomiting    Metabolic Disorder Labs: Lab Results  Component Value Date   HGBA1C 5.3 08/19/2017   No results found for: PROLACTIN Lab Results  Component Value Date   CHOL 213 (H) 11/03/2017   TRIG 136 11/03/2017   HDL 43 11/03/2017   CHOLHDL 5.0 (H) 11/03/2017   VLDL 25 04/06/2017   LDLCALC 143 (H) 11/03/2017   LDLCALC 120 (H) 09/02/2017   Lab Results  Component Value Date   TSH 0.735 11/03/2017   TSH  1.29 09/13/2016    Therapeutic Level Labs: No results found for: LITHIUM No results found for: VALPROATE No components found for:  CBMZ  Current Medications: Current Outpatient Medications  Medication Sig Dispense Refill  . albuterol (PROAIR HFA) 108 (90 Base) MCG/ACT inhaler INHALE TWO PUFFS INTO THE LUNGS EVERY 6 HOURS AS NEEDED FOR WHEEZING. 8.5 Inhaler 3  . amoxicillin-clavulanate (AUGMENTIN) 875-125 MG tablet Take 1 tablet by mouth 2 (two) times daily. 20 tablet 0  . beclomethasone (QVAR) 40 MCG/ACT inhaler INHALE TWO PUFFS INTO THE LUNGS TWO TIMES DAILY. 8.7 Inhaler 3  .  benzonatate (TESSALON) 100 MG capsule Take 1-2 capsules (100-200 mg total) by mouth 3 (three) times daily as needed for cough. 60 capsule 0  . bictegravir-emtricitabine-tenofovir AF (BIKTARVY) 50-200-25 MG TABS tablet Take 1 tablet by mouth daily. 30 tablet 5  . cetirizine (ZYRTEC) 10 MG tablet Take 2 tablets (20 mg total) by mouth at bedtime. 60 tablet 0  . citalopram (CELEXA) 40 MG tablet Take 1 tablet (40 mg total) by mouth daily. 90 tablet 0  . fexofenadine (ALLEGRA ALLERGY) 180 MG tablet Take 1 tablet (180 mg total) by mouth daily. 90 tablet 1  . lubiprostone (AMITIZA) 8 MCG capsule Take 8 mcg by mouth 2 (two) times daily with a meal.    . meloxicam (MOBIC) 15 MG tablet Take 1 tablet (15 mg total) by mouth daily. 30 tablet 0  . montelukast (SINGULAIR) 10 MG tablet TAKE 1 TABLET (10 MG TOTAL) AT BEDTIME BY MOUTH. 30 tablet 3  . naproxen (EC NAPROSYN) 500 MG EC tablet Take 1 tablet (500 mg total) by mouth 2 (two) times daily with a meal. 28 tablet 1  . oxymetazoline (AFRIN) 0.05 % nasal spray Place 1 spray into both nostrils 2 (two) times daily. For <7 days at a time.    . ranitidine (ZANTAC) 150 MG tablet Take 1 tablet (150 mg total) by mouth 2 (two) times daily. 60 tablet 0  . rizatriptan (MAXALT-MLT) 5 MG disintegrating tablet Take 2 tablets (10 mg total) by mouth as needed for migraine. May repeat in 2 hours if  needed 10 tablet 0  . tiZANidine (ZANAFLEX) 4 MG capsule Take 1 capsule (4 mg total) by mouth 3 (three) times daily as needed for muscle spasms. 90 capsule 2  . Topiramate ER 100 MG CS24 Take 100 mg by mouth at bedtime. 90 each 1  . traMADol (ULTRAM) 50 MG tablet Take 1 tablet (50 mg total) by mouth every 8 (eight) hours as needed. 30 tablet 0  . traZODone (DESYREL) 100 MG tablet Take 1 tablet (100 mg total) by mouth at bedtime. Take 1-2 tablets for sleep 180 tablet 1  . triamcinolone cream (KENALOG) 0.1 % Apply 1 application topically 2 (two) times daily. 454 g 1  . valACYclovir (VALTREX) 500 MG tablet TAKE 1 TABLET (500 MG TOTAL) BY MOUTH DAILY. 30 tablet 0  . albuterol (PROVENTIL) (2.5 MG/3ML) 0.083% nebulizer solution Take 3 mLs (2.5 mg total) by nebulization every 6 (six) hours as needed for wheezing or shortness of breath (If no relief from MDI inhaler). (Patient not taking: Reported on 12/01/2017) 75 mL 3  . buPROPion (WELLBUTRIN SR) 100 MG 12 hr tablet Take 1 tablet (100 mg total) by mouth daily. 90 tablet 0  . DENTA 5000 PLUS 1.1 % CREA dental cream USE SMALL AMOUNT ON TEETH AND GUMS EVERY EVENING SPIT OUT EXCESS AND DO NOT RINSE  3   No current facility-administered medications for this visit.      Musculoskeletal: Strength & Muscle Tone: within normal limits Gait & Station: normal Patient leans: N/A  Psychiatric Specialty Exam: ROS  Blood pressure 102/66, pulse 77, height 5\' 3"  (1.6 m), weight 191 lb 12.8 oz (87 kg), SpO2 98 %.Body mass index is 33.98 kg/m.  General Appearance: Casual and Well Groomed  Eye Contact:  Good  Speech:  Clear and Coherent and Normal Rate  Volume:  Normal  Mood:  energy is poor  Affect:  Congruent and Flat  Thought Process:  Coherent and Descriptions of Associations: Intact  Orientation:  Full (Time, Place, and Person)  Thought Content: Logical   Suicidal Thoughts:  No  Homicidal Thoughts:  No  Memory:  Immediate;   Fair  Judgement:  Fair   Insight:  Fair  Psychomotor Activity:  Normal  Concentration:  Concentration: Fair  Recall:  [employer] of Knowledge: Fair  Language: Fair  Akathisia:  Negative  Handed:  Right  AIMS (if indicated): not done  Assets:  Communication Skills Desire for Improvement Financial Resources/Insurance Housing  ADL's:  Intact  Cognition: WNL  Sleep:  Fair   Screenings: GAD-7     Office Visit from 12/01/2017 in [place] for [place] from 02/28/2017 in [facility] for Infectious Disease Procedure visit from 10/28/2016 in [place] for [place] Procedure visit from 09/24/2016 in [place] for [place]  Total GAD-7 Score  4  12  1   0    GASS     Counselor from 03/14/2017 in [facility] for Infectious Disease  GASS Overall Total Score  13    MDI     Counselor from 02/28/2017 in [facility] for Infectious Disease  Total Score (max 50)  34    PHQ2-9     Office Visit from 01/23/2018 in [place] Office Visit from 01/02/2018 in [facility] for Infectious Disease Office Visit from 12/01/2017 in [facility] for [place] Procedure visit from 10/27/2017 in [place] for [place] Office Visit from 09/09/2017 in [place]  PHQ-2 Total Score  0  0  2  2  0  PHQ-9 Total Score  -  -  7  9  -    SBQ-R     Counselor from 03/14/2017 in [facility] for Infectious Disease  SBQ-R Total Score  13.1       Assessment and Plan:  [name] reports good tolerance of celexa and some interval improvement of mood/anxiety. Discussed augmentation with wellbutrin for ongoing fatigue and low motivation. Patient has no history of seizures.  Reviewed risks and common side effects with wellbutrin.  Will rtc in 8 weeks.   Disclosed to patient that this Probation officer is leaving this practice at the end of August 2019, and patients always has the  right to choose their provider. Reassured patient that office will work to provide smooth transition of care whether they wish to remain at this office, or to continue with this provider, or seek alternative care options in community.  They expressed understanding.   1. Chronic post-traumatic stress disorder (PTSD)   2. Major depressive disorder, single episode, moderate (HCC)   3. Emotional lability   4. Mood disorder in conditions classified elsewhere     Status of current problems: unchanged  Labs Ordered: No orders of the defined types were placed in this encounter.   Labs Reviewed: n/a  Collateral Obtained/Records Reviewed: n/a  Plan:  Celexa 40 mg daily Wellbutrin 100 mg SR daily in AM Okay to use trazodone 100 mg at bedtime for sleep Agree with sleep study referral from pcp Education patient on sx of serotonin syndrom given ssri+tramadol. No current intolerance or negative interactions to report  Aundra Dubin, MD 01/31/2018, 2:22 PM

## 2018-02-01 ENCOUNTER — Other Ambulatory Visit: Payer: Self-pay | Admitting: Infectious Diseases

## 2018-02-01 DIAGNOSIS — J45991 Cough variant asthma: Secondary | ICD-10-CM

## 2018-02-01 NOTE — Telephone Encounter (Signed)
Patient was wondering about her urine results from her last office visit and wanted to know if there was anything that she should be worried about. Please advise

## 2018-02-03 ENCOUNTER — Telehealth: Payer: Self-pay

## 2018-02-03 NOTE — Telephone Encounter (Signed)
Patient notified

## 2018-02-03 NOTE — Telephone Encounter (Signed)
-----   Message from [name], [place] sent at 02/02/2018  2:27 PM EDT ----- Regarding: "Lab Results" [name],   Please let patient know that recent Urinalysis indicated that she mildly dehydrated and needs to increase fluids. We will re-evaluate at next office visit on 02/15/2018.  Thanks!

## 2018-02-03 NOTE — Telephone Encounter (Signed)
-----   Message from [name], [place] sent at 02/02/2018  2:27 PM EDT ----- Regarding: "Lab Results" [name],   Please let patient know that recent Urinalysis indicated that she mildly dehydrated and needs to increase fluids. We will re-evaluate at next office visit on 02/15/2018.  Thanks!

## 2018-02-07 ENCOUNTER — Ambulatory Visit (INDEPENDENT_AMBULATORY_CARE_PROVIDER_SITE_OTHER): Payer: BLUE CROSS/BLUE SHIELD | Admitting: Licensed Clinical Social Worker

## 2018-02-07 ENCOUNTER — Telehealth: Payer: Self-pay | Admitting: Family Medicine

## 2018-02-07 DIAGNOSIS — F4312 Post-traumatic stress disorder, chronic: Secondary | ICD-10-CM

## 2018-02-07 NOTE — Telephone Encounter (Signed)
Pt contacted the Sleep Center for referral appt and was notified that a referral must be placed from this center; pt states that sleep center has not received a referral; requests a call back

## 2018-02-08 ENCOUNTER — Other Ambulatory Visit: Payer: Self-pay | Admitting: Family Medicine

## 2018-02-08 ENCOUNTER — Encounter (HOSPITAL_COMMUNITY): Payer: Self-pay | Admitting: Licensed Clinical Social Worker

## 2018-02-08 DIAGNOSIS — G4700 Insomnia, unspecified: Secondary | ICD-10-CM

## 2018-02-08 NOTE — Telephone Encounter (Signed)
Patient notified

## 2018-02-08 NOTE — Progress Notes (Signed)
   THERAPIST PROGRESS NOTE  Session Time: 2-3  Participation Level: Minimal  Behavioral Response: Well GroomedLethargicDysphoric  Type of Therapy: Individual Therapy  Treatment Goals addressed: Diagnosis: MDD  Interventions: CBT and Motivational Interviewing  Summary: [name] is a 37 y.o. female who presents with MDD, HIV, and long hx of disordered sleep. She is minimally active and not engaged in tx. She presents as flat and states she is tired.  She states she slept "64min last night". She reports she tried to drink coffee at night since "hot beverages help me sleep". Counselor explained that caffeine does not promote sleep, which pt agreed w/ but stated "I thought it might help". Pt denies having any ongoing mental health worried or stressors. She feels confident in her ability to handle stress at work and "needs to focus more on her sleep and physical habits". Counselor recommends pt take a break from talk therapy to focus her money and energy on "getting healthier". She fears she may have Sleep Apnea and states she occasionally wakes up "struggling to breath". Counselor spent time w/ pt surfing websites of doctors offices and connecting pt to resources for referrals to address ongoing medical conditions.  Suicidal/Homicidal: Nowithout intent/plan  Therapist Response: Counselor used open questions, active listening, and goal-setting focus. Counselor emphasized pt's need to address physical issues since her ongoing depression, social anxiety, and low self esteem are currently resolved.   Plan: Return again as needed.  Diagnosis:    ICD-10-CM   1. Chronic post-traumatic stress disorder (PTSD) F43.12       Archie Balboa, LCAS-A 02/08/2018

## 2018-02-08 NOTE — Telephone Encounter (Signed)
-----   Message from [name], [place] sent at 02/08/2018 12:30 PM EDT ----- Regarding: "Sleep Study" Referral in for Sleep Study.  Please contact office to inform.  Thank you!

## 2018-02-08 NOTE — Progress Notes (Signed)
Sleep Study ordered today.

## 2018-02-13 NOTE — Telephone Encounter (Signed)
Provider needs to do a peer to peer within 24hrs for the sleep study for [place]. Give patient member number when you call. Provider has been notify.  [phone]

## 2018-02-14 ENCOUNTER — Other Ambulatory Visit: Payer: Self-pay | Admitting: Family Medicine

## 2018-02-15 ENCOUNTER — Ambulatory Visit (INDEPENDENT_AMBULATORY_CARE_PROVIDER_SITE_OTHER): Payer: BLUE CROSS/BLUE SHIELD | Admitting: Family Medicine

## 2018-02-15 ENCOUNTER — Encounter: Payer: Self-pay | Admitting: Family Medicine

## 2018-02-15 VITALS — BP 132/90 | HR 84 | Temp 98.4°F | Ht 63.0 in | Wt 196.0 lb

## 2018-02-15 DIAGNOSIS — J45991 Cough variant asthma: Secondary | ICD-10-CM

## 2018-02-15 DIAGNOSIS — M549 Dorsalgia, unspecified: Secondary | ICD-10-CM | POA: Diagnosis not present

## 2018-02-15 DIAGNOSIS — G4700 Insomnia, unspecified: Secondary | ICD-10-CM

## 2018-02-15 NOTE — Progress Notes (Signed)
Subjective:    Patient ID: JOCI DRESS, female    DOB: 10-18-1980, 37 y.o.   MRN: [id number]   PCP: Kathe Becton, NP  Chief Complaint  Patient presents with  . Follow-up    chronic back pain  . Constipation  . Bloated    HP:  Ms. Alvizo has history of Asthma, Type 2 Diabetes, HIV, Headaches, GERD, Depression, Bipolar, Anemia, and Anxiety. She is here today for follow up of right ear pain and general weakness.   Current Status: She states that right ear pain has resolved after completion of antibiotic. She denies hearing loss, ear drainage, ring in ears, headaches, visual changes, dizziness, unsteadiness, and falls.   She reports increase fatigue, weakness, sluggishness, and feeling drained. She states that she has not been able to sleep lately. She has recently be initiated on Wellbutrin X 2 weeks now. She denies fevers, chills, recent infections, weight loss, and night sweats. She denies cough, heart palpitations, shortness of breath, and chest pain.   She has a good appetite, and normal bowel movements. Denies abdominal pain, nausea, vomiting, diarrhea, and constipation. She denies blood in her stools, dysuria, and hematuria.   Denies anxiety and depression.   Denies back pain.   Past Medical History:  Diagnosis Date  . Allergy   . Anemia   . Anxiety   . Anxiety state, unspecified 12/07/2012  . Arthritis   . Asthma   . Bipolar disorder, unspecified ([place]) 12/07/2012  . Depression   . Diabetes mellitus without complication ([place])    no meds currently- controlled with diet  . GERD (gastroesophageal reflux disease)   . GERD (gastroesophageal reflux disease) 12/07/2012  . Headache(784.0)    migraines  . Herpes genitalis in women 12/07/2012  . HIV infection ([place])   . Human immunodeficiency virus (HIV) disease ([place]) 12/07/2012  . Neuromuscular disorder ([place])    carpal tunnel in both arms  . Type II or unspecified type diabetes mellitus without mention of complication,  not stated as uncontrolled 12/07/2012  . Unspecified asthma(493.90) 12/07/2012    Family History  Problem Relation Age of Onset  . Arthritis/Rheumatoid Mother   . Hypertension Mother   . Cancer Maternal Aunt        brand and lung  . Cancer Maternal Grandmother        breasts  . Cancer Paternal Grandmother        breast and kidney    Social History   Socioeconomic History  . Marital status: Single    Spouse name: Not on file  . Number of children: 1  . Years of education: 33  . Highest education level: Not on file  Occupational History  . Not on file  Social Needs  . Financial resource strain: Not on file  . Food insecurity:    Worry: Not on file    Inability: Not on file  . Transportation needs:    Medical: Not on file    Non-medical: Not on file  Tobacco Use  . Smoking status: Never Smoker  . Smokeless tobacco: Never Used  Substance and Sexual Activity  . Alcohol use: No    Alcohol/week: 0.0 oz    Comment: OCC.about once a month   . Drug use: No  . Sexual activity: Never    Partners: Male    Birth control/protection: Implant  Lifestyle  . Physical activity:    Days per week: Not on file    Minutes per session: Not on file  .  Stress: Not on file  Relationships  . Social connections:    Talks on phone: Not on file    Gets together: Not on file    Attends religious service: Not on file    Active member of club or organization: Not on file    Attends meetings of clubs or organizations: Not on file    Relationship status: Not on file  . Intimate partner violence:    Fear of current or ex partner: Not on file    Emotionally abused: Not on file    Physically abused: Not on file    Forced sexual activity: Not on file  Other Topics Concern  . Not on file  Social History Narrative   Patient lives alone.   Patient works at home.   Patient has hs education.   Patient has 1 child.   Patient drinks multiple energy drinks and coffees throughout day, daily.     Past Surgical History:  Procedure Laterality Date  . CESAREAN SECTION    . COLON SURGERY     colon repair after salpingo  . INSERTION OF MESH N/A 11/06/2014   Procedure: INSERTION OF MESH;  Surgeon: Stark Klein, MD;  Location: [facility];  Service: General;  Laterality: N/A;  . LAPAROSCOPIC HEPATECTOMY N/A 06/28/2013   Procedure: LAPAROSCOPIC EXCISION HEPATIC MASS;  Surgeon: Stark Klein, MD;  Location: [facility];  Service: General;  Laterality: N/A;  . LAPAROSCOPIC LYSIS OF ADHESIONS N/A 12/04/2012   Procedure: LAPAROSCOPIC LYSIS OF ADHESIONS;  Surgeon: [name], MD;  Location: [facility];  Service: Gynecology;  Laterality: N/A;  . LAPAROTOMY N/A 12/04/2012   Procedure: EXPLORATORY LAPAROTOMY;  Surgeon: [name], MD;  Location: [facility];  Service: Gynecology;  Laterality: N/A;  repair of serosal injury  . ROBOTIC ASSISTED BILATERAL SALPINGO OOPHERECTOMY Right 12/04/2012   Procedure: ROBOTIC ASSISTED Right  SALPINGO OOPHORECTOMY;  Surgeon: [name], MD;  Location: [facility];  Service: Gynecology;  Laterality: Right;  . VENTRAL HERNIA REPAIR N/A 11/06/2014   Procedure: LAPAROSCOPIC ASSITED INCARCERATED VENTRAL HERNIA REPAIR WITH MESH;  Surgeon: Stark Klein, MD;  Location: [facility];  Service: General;  Laterality: N/A;    Immunization History  Administered Date(s) Administered  . Hepatitis A, Adult 08/03/2017  . Hepatitis B 03/20/2008, 09/25/2008, 06/03/2009  . Hepatitis B, adult 06/19/2014, 08/05/2014, 01/15/2015  . Influenza Split 08/02/2012  . Influenza Whole 06/13/2008, 06/03/2009, 06/01/2010  . Influenza,inj,Quad PF,6+ Mos 05/03/2013, 06/12/2014, 04/26/2016, 05/31/2017  . Meningococcal Mcv4o 02/07/2017  . PPD Test 05/31/2014  . Pneumococcal Polysaccharide-23 03/20/2008, 05/03/2013  . Tdap 08/02/2012    Current Meds  Medication Sig  . albuterol (PROAIR HFA) 108 (90 Base) MCG/ACT inhaler INHALE TWO PUFFS INTO THE LUNGS EVERY 6 HOURS AS NEEDED FOR WHEEZING.  Marland Kitchen  albuterol (PROVENTIL) (2.5 MG/3ML) 0.083% nebulizer solution Take 3 mLs (2.5 mg total) by nebulization every 6 (six) hours as needed for wheezing or shortness of breath (If no relief from MDI inhaler).  . beclomethasone (QVAR) 40 MCG/ACT inhaler INHALE TWO PUFFS INTO THE LUNGS TWO TIMES DAILY.  . bictegravir-emtricitabine-tenofovir AF (BIKTARVY) 50-200-25 MG TABS tablet Take 1 tablet by mouth daily.  Marland Kitchen buPROPion (WELLBUTRIN SR) 100 MG 12 hr tablet Take 1 tablet (100 mg total) by mouth daily.  . cetirizine (ZYRTEC) 10 MG tablet Take 2 tablets (20 mg total) by mouth at bedtime.  . citalopram (CELEXA) 40 MG tablet Take 1 tablet (40 mg total) by mouth daily.  Marland Kitchen lubiprostone (AMITIZA) 8 MCG capsule Take 8 mcg  by mouth 2 (two) times daily with a meal.  . meloxicam (MOBIC) 15 MG tablet Take 1 tablet (15 mg total) by mouth daily.  . montelukast (SINGULAIR) 10 MG tablet TAKE 1 TABLET AT BEDTIME BY MOUTH.  . naproxen (EC NAPROSYN) 500 MG EC tablet Take 1 tablet (500 mg total) by mouth 2 (two) times daily with a meal.  . oxymetazoline (AFRIN) 0.05 % nasal spray Place 1 spray into both nostrils 2 (two) times daily. For <7 days at a time.  . ranitidine (ZANTAC) 150 MG tablet Take 1 tablet (150 mg total) by mouth 2 (two) times daily.  . rizatriptan (MAXALT-MLT) 5 MG disintegrating tablet Take 2 tablets (10 mg total) by mouth as needed for migraine. May repeat in 2 hours if needed  . tiZANidine (ZANAFLEX) 4 MG capsule Take 1 capsule (4 mg total) by mouth 3 (three) times daily as needed for muscle spasms.  . Topiramate ER 100 MG CS24 Take 100 mg by mouth at bedtime.  . traMADol (ULTRAM) 50 MG tablet Take 1 tablet (50 mg total) by mouth every 8 (eight) hours as needed.  . traZODone (DESYREL) 100 MG tablet Take 1 tablet (100 mg total) by mouth at bedtime. Take 1-2 tablets for sleep  . valACYclovir (VALTREX) 500 MG tablet TAKE 1 TABLET (500 MG TOTAL) BY MOUTH DAILY.  . [DISCONTINUED] amoxicillin-clavulanate  (AUGMENTIN) 875-125 MG tablet Take 1 tablet by mouth 2 (two) times daily.  . [DISCONTINUED] benzonatate (TESSALON) 100 MG capsule Take 1-2 capsules (100-200 mg total) by mouth 3 (three) times daily as needed for cough.    Allergies  Allergen Reactions  . Anette Guarneri [Lurasidone Hcl] Other (See Comments)    Occular dystonia  . Seroquel [Quetiapine Fumarate] Swelling    Tongue swells   . Benzoin Other (See Comments)    blisters  . Latex Rash and Other (See Comments)  . Sulfamethoxazole-Trimethoprim Itching and Nausea And Vomiting    BP 132/90 (BP Location: Right Wrist, Patient Position: Sitting, Cuff Size: Large)   Pulse 84   Temp 98.4 F (36.9 C) (Oral)   Ht 5\' 3"  (1.6 m)   Wt 196 lb (88.9 kg)   LMP  (LMP Unknown)   SpO2 100%   BMI 34.72 kg/m    Review of Systems  Constitutional: Negative.   HENT: Negative.   Eyes: Negative.   Respiratory: Negative.   Cardiovascular: Negative.   Gastrointestinal: Positive for constipation.  Endocrine: Negative.   Genitourinary: Negative.   Musculoskeletal: Negative.   Skin: Negative.   Allergic/Immunologic: Negative.   Neurological: Negative.   Hematological: Negative.   Psychiatric/Behavioral: Negative.    Objective:   Physical Exam  Constitutional: She is oriented to person, place, and time and well-developed, well-nourished, and in no distress.  HENT:  Head: Normocephalic and atraumatic.  Right Ear: External ear normal.  Left Ear: External ear normal.  Mouth/Throat: Oropharynx is clear and moist.  Eyes: Pupils are equal, round, and reactive to light. Conjunctivae and EOM are normal.  Neck: Normal range of motion. Neck supple.  Cardiovascular: Normal rate, regular rhythm, normal heart sounds and intact distal pulses.  Pulmonary/Chest: Effort normal and breath sounds normal.  Abdominal: Soft. Bowel sounds are normal.  Musculoskeletal: Normal range of motion.  Neurological: She is alert and oriented to person, place, and time. She  has normal reflexes. Gait normal. GCS score is 15.  Skin: Skin is warm and dry.  Psychiatric: Memory, affect and judgment normal.  Nursing note and vitals reviewed.  Assessment & Plan:   1. Back pain, unspecified back location, unspecified back pain laterality, unspecified chronicity Stable. Continue Tramadol and Ibuprofen as directed.   2. Insomnia, unspecified type She will continue to take Celexa as directed at bedtime. Because of her recent onset of increase weakness, sluggishness, fatigue, and feeling drained. She has began taking Wellbutrin as prescribed X 2 weeks. Insurance will not cover overnight sleep study. We will order at home sleep study for patient today.   3. Asthma, cough variant Stable. She will continue Albuterol and QVAR Inhalers as prescribed. We will place order for Nebulizer Machine today. She will begin to use nebs as prescribed.   4. Ear infection Resolved.   5. Follow up She will follow up in 2 months.   No orders of the defined types were placed in this encounter.   Kathe Becton,  MSN, FNP-BC [facility] [address] [place], [place] [address] [phone]

## 2018-02-20 ENCOUNTER — Other Ambulatory Visit: Payer: Self-pay | Admitting: Family Medicine

## 2018-02-20 DIAGNOSIS — G4700 Insomnia, unspecified: Secondary | ICD-10-CM

## 2018-02-20 NOTE — Progress Notes (Signed)
Rx sent for in-home sleep study today.

## 2018-02-21 ENCOUNTER — Ambulatory Visit (HOSPITAL_COMMUNITY): Payer: Self-pay | Admitting: Licensed Clinical Social Worker

## 2018-02-27 ENCOUNTER — Encounter (HOSPITAL_COMMUNITY): Payer: Self-pay | Admitting: Psychiatry

## 2018-02-27 ENCOUNTER — Ambulatory Visit (HOSPITAL_BASED_OUTPATIENT_CLINIC_OR_DEPARTMENT_OTHER): Payer: BLUE CROSS/BLUE SHIELD | Attending: Family Medicine | Admitting: Internal Medicine

## 2018-02-27 DIAGNOSIS — G4700 Insomnia, unspecified: Secondary | ICD-10-CM | POA: Diagnosis not present

## 2018-02-27 DIAGNOSIS — R0683 Snoring: Secondary | ICD-10-CM | POA: Diagnosis not present

## 2018-03-02 ENCOUNTER — Telehealth: Payer: Self-pay

## 2018-03-04 DIAGNOSIS — G4700 Insomnia, unspecified: Secondary | ICD-10-CM | POA: Diagnosis not present

## 2018-03-04 NOTE — Procedures (Signed)
    Patient Name: [name], [name] Date: 02/28/2018 Gender: Female D.O.B: September 06, 1980 Age (years): 36 Referring Provider: Cammie Sickle Height (inches): 19 Interpreting Physician: Baird Lyons MD, ABSM Weight (lbs): 190 RPSGT: Jacolyn Reedy BMI: 33 MRN: [id number] Neck Size: 14.50  CLINICAL INFORMATION Sleep Study Type: HST  Indication for sleep study: Insomnia  Epworth Sleepiness Score: 12  SLEEP STUDY TECHNIQUE A multi-channel overnight portable sleep study was performed. The channels recorded were: nasal airflow, thoracic respiratory movement, and oxygen saturation with a pulse oximetry. Snoring was also monitored.  MEDICATIONS Patient self administered medications include: none reported.  SLEEP ARCHITECTURE Patient was studied for 443.6 minutes. The sleep efficiency was 98.6 % and the patient was supine for 36.5%. The arousal index was 0.0 per hour.  RESPIRATORY PARAMETERS The overall AHI was 3.8 per hour, with a central apnea index of 0.0 per hour.  The oxygen nadir was 83% during sleep.  CARDIAC DATA Mean heart rate during sleep was 76.8 bpm.  IMPRESSIONS - No significant obstructive sleep apnea occurred during this study (AHI = 3.8/h). - No significant central sleep apnea occurred during this study (CAI = 0.0/h). - Oxygen desaturation was noted during this study (Min O2 = 83%, Mean 95%). - Patient snored.  DIAGNOSIS - Primary snoring  RECOMMENDATIONS - Manage for snoring as appropriate. - Be careful with alcohol, sedatives and other CNS depressants that may worsen sleep apnea and disrupt normal sleep architecture. - Sleep hygiene should be reviewed to assess factors that may improve sleep quality. - Weight management and regular exercise should be initiated or continued.   [place], Tax adviser of Sleep Medicine  ELECTRONICALLY SIGNED ON:  03/04/2018, 10:18 AM [place] PH: [phone]   FX:  [phone] [place]

## 2018-03-06 NOTE — Telephone Encounter (Signed)
Order was sent to [facility] per patient

## 2018-03-06 NOTE — Telephone Encounter (Signed)
Left a vm for patient to callback 

## 2018-03-07 ENCOUNTER — Ambulatory Visit (HOSPITAL_COMMUNITY): Payer: Self-pay | Admitting: Licensed Clinical Social Worker

## 2018-03-08 ENCOUNTER — Telehealth: Payer: Self-pay

## 2018-03-13 DIAGNOSIS — J45991 Cough variant asthma: Secondary | ICD-10-CM | POA: Diagnosis not present

## 2018-03-16 DIAGNOSIS — M545 Low back pain: Secondary | ICD-10-CM | POA: Diagnosis not present

## 2018-03-16 DIAGNOSIS — M546 Pain in thoracic spine: Secondary | ICD-10-CM | POA: Diagnosis not present

## 2018-03-16 DIAGNOSIS — M542 Cervicalgia: Secondary | ICD-10-CM | POA: Diagnosis not present

## 2018-03-16 DIAGNOSIS — M5124 Other intervertebral disc displacement, thoracic region: Secondary | ICD-10-CM | POA: Diagnosis not present

## 2018-03-16 DIAGNOSIS — M5033 Other cervical disc degeneration, cervicothoracic region: Secondary | ICD-10-CM | POA: Diagnosis not present

## 2018-03-16 DIAGNOSIS — M5126 Other intervertebral disc displacement, lumbar region: Secondary | ICD-10-CM | POA: Diagnosis not present

## 2018-03-20 ENCOUNTER — Telehealth: Payer: Self-pay | Admitting: Family Medicine

## 2018-03-20 NOTE — Telephone Encounter (Signed)
Results of Sleep Study reviewed with patient today. Patient verified understanding today.

## 2018-03-21 DIAGNOSIS — M5033 Other cervical disc degeneration, cervicothoracic region: Secondary | ICD-10-CM | POA: Diagnosis not present

## 2018-03-21 DIAGNOSIS — M546 Pain in thoracic spine: Secondary | ICD-10-CM | POA: Diagnosis not present

## 2018-03-21 DIAGNOSIS — M5126 Other intervertebral disc displacement, lumbar region: Secondary | ICD-10-CM | POA: Diagnosis not present

## 2018-03-21 DIAGNOSIS — M545 Low back pain: Secondary | ICD-10-CM | POA: Diagnosis not present

## 2018-03-21 DIAGNOSIS — M5124 Other intervertebral disc displacement, thoracic region: Secondary | ICD-10-CM | POA: Diagnosis not present

## 2018-03-23 ENCOUNTER — Ambulatory Visit: Payer: Self-pay | Admitting: Family Medicine

## 2018-03-23 ENCOUNTER — Telehealth: Payer: Self-pay | Admitting: *Deleted

## 2018-03-23 DIAGNOSIS — N760 Acute vaginitis: Secondary | ICD-10-CM

## 2018-03-23 DIAGNOSIS — N898 Other specified noninflammatory disorders of vagina: Secondary | ICD-10-CM

## 2018-03-23 DIAGNOSIS — B9689 Other specified bacterial agents as the cause of diseases classified elsewhere: Secondary | ICD-10-CM

## 2018-03-23 LAB — POCT URINALYSIS DIPSTICK
BILIRUBIN UA: NEGATIVE
GLUCOSE UA: NEGATIVE
Ketones, UA: NEGATIVE
Leukocytes, UA: NEGATIVE
Nitrite, UA: NEGATIVE
PH UA: 5 (ref 5.0–8.0)
Protein, UA: NEGATIVE
RBC UA: NEGATIVE
Spec Grav, UA: >=1.03 — AB (ref 1.010–1.025)
UROBILINOGEN UA: 0.2 U/dL

## 2018-03-23 MED ORDER — METRONIDAZOLE 500 MG PO TABS: 500.0000 mg | ORAL_TABLET | Freq: Two times a day (BID) | ORAL | 0 refills | Status: AC

## 2018-03-23 NOTE — Patient Instructions (Signed)

## 2018-03-23 NOTE — Telephone Encounter (Signed)
Patient walked in complaining of vaginal discharge, itching with light odor.  RN obtained verbal order per Terri Piedra for wet prep to test for Bacterial Vaginosis, but the test kit is out of stock at [facility]. Patient unable to get an appointment at PCP or GYN, urgent care copay is $75.  RN advised patient to try [facility] clinic on [address]. She agreed. Landis Gandy, RN

## 2018-03-23 NOTE — Progress Notes (Signed)
[name] is a 37 y.o. female who presents today with concerns of vaginal irritation and odor.Patient reports frequent vaginal infections related to sensitive skin and soaps and perfumes as the trigger for vaginal infections. She has not attempted to self treat for this condition and has been in contact with PCP who recommended she come to this location for evaluation and treatment.  Review of Systems  Constitutional: Negative for chills, fever and malaise/fatigue.  HENT: Negative for congestion, ear discharge, ear pain, sinus pain and sore throat.   Eyes: Negative.   Respiratory: Negative for cough, sputum production and shortness of breath.   Cardiovascular: Negative.  Negative for chest pain.  Gastrointestinal: Negative for abdominal pain, diarrhea, nausea and vomiting.  Genitourinary: Negative for dysuria, frequency, hematuria and urgency.       Vaginal itching irritation and discharge  Musculoskeletal: Negative for myalgias.  Skin: Negative.   Neurological: Negative for headaches.  Endo/Heme/Allergies: Negative.   Psychiatric/Behavioral: Negative.     O: Vitals:   03/23/18 1544  BP: 114/76  Pulse: 93  Resp: 20  Temp: 98.3 F (36.8 C)  SpO2: 99%     Physical Exam  Constitutional: She is oriented to person, place, and time. Vital signs are normal. She appears well-developed and well-nourished. She is active.  Non-toxic appearance. She does not have a sickly appearance.  HENT:  Head: Normocephalic.  Right Ear: Hearing and external ear normal.  Left Ear: Hearing and external ear normal.  Nose: Nose normal.  Mouth/Throat: Uvula is midline and oropharynx is clear and moist.  Neck: Normal range of motion. Neck supple.  Cardiovascular: Normal rate, regular rhythm, normal heart sounds and normal pulses.  Pulmonary/Chest: Effort normal and breath sounds normal.  Abdominal: Soft. Bowel sounds are normal.  Musculoskeletal: Normal range of motion.  Lymphadenopathy:   Head (right side): No submental and no submandibular adenopathy present.       Head (left side): No submental and no submandibular adenopathy present.    She has no cervical adenopathy.  Neurological: She is alert and oriented to person, place, and time.  Psychiatric: She has a normal mood and affect.  Vitals reviewed.    A: 1. Bacterial vaginosis   2. Vaginal discharge      P: Discussed exam findings, diagnosis etiology and medication use and indications reviewed with patient. Follow- Up and discharge instructions provided. No emergent/urgent issues found on exam.  Patient verbalized understanding of information provided and agrees with plan of care (POC), all questions answered.  1. Bacterial vaginosis- Will trial based on patient reported description of symptoms and history. Advised to f/u for complete vaginal evaluation if symptoms not improved. - metroNIDAZOLE (FLAGYL) 500 MG tablet; Take 1 tablet (500 mg total) by mouth 2 (two) times daily for 7 days.  2. Vaginal discharge - POCT urinalysis dipstick  Results for orders placed or performed in visit on 03/23/18 (from the past 24 hour(s))  POCT urinalysis dipstick     Status: Abnormal   Collection Time: 03/23/18  3:49 PM  Result Value Ref Range   Color, UA     Clarity, UA     Glucose, UA Negative Negative   Bilirubin, UA Neg    Ketones, UA Neg    Spec Grav, UA >=1.030 (A) 1.010 - 1.025   Blood, UA Neg    pH, UA 5.0 5.0 - 8.0   Protein, UA Negative Negative   Urobilinogen, UA 0.2 0.2 or 1.0 E.U./dL   Nitrite, UA Neg  Leukocytes, UA Negative Negative   Appearance     Odor

## 2018-03-27 DIAGNOSIS — M791 Myalgia, unspecified site: Secondary | ICD-10-CM | POA: Diagnosis not present

## 2018-03-27 DIAGNOSIS — M5033 Other cervical disc degeneration, cervicothoracic region: Secondary | ICD-10-CM | POA: Diagnosis not present

## 2018-03-27 DIAGNOSIS — M546 Pain in thoracic spine: Secondary | ICD-10-CM | POA: Diagnosis not present

## 2018-03-27 DIAGNOSIS — M5124 Other intervertebral disc displacement, thoracic region: Secondary | ICD-10-CM | POA: Diagnosis not present

## 2018-03-28 ENCOUNTER — Ambulatory Visit (HOSPITAL_COMMUNITY): Payer: Self-pay | Admitting: Psychiatry

## 2018-03-29 DIAGNOSIS — M791 Myalgia, unspecified site: Secondary | ICD-10-CM | POA: Diagnosis not present

## 2018-03-29 DIAGNOSIS — M545 Low back pain: Secondary | ICD-10-CM | POA: Diagnosis not present

## 2018-03-29 DIAGNOSIS — M5124 Other intervertebral disc displacement, thoracic region: Secondary | ICD-10-CM | POA: Diagnosis not present

## 2018-03-29 DIAGNOSIS — M546 Pain in thoracic spine: Secondary | ICD-10-CM | POA: Diagnosis not present

## 2018-03-29 DIAGNOSIS — M5033 Other cervical disc degeneration, cervicothoracic region: Secondary | ICD-10-CM | POA: Diagnosis not present

## 2018-04-03 DIAGNOSIS — M5033 Other cervical disc degeneration, cervicothoracic region: Secondary | ICD-10-CM | POA: Diagnosis not present

## 2018-04-03 DIAGNOSIS — M546 Pain in thoracic spine: Secondary | ICD-10-CM | POA: Diagnosis not present

## 2018-04-03 DIAGNOSIS — M791 Myalgia, unspecified site: Secondary | ICD-10-CM | POA: Diagnosis not present

## 2018-04-03 DIAGNOSIS — M5124 Other intervertebral disc displacement, thoracic region: Secondary | ICD-10-CM | POA: Diagnosis not present

## 2018-04-04 ENCOUNTER — Encounter: Payer: Self-pay | Admitting: Infectious Diseases

## 2018-04-05 ENCOUNTER — Ambulatory Visit (INDEPENDENT_AMBULATORY_CARE_PROVIDER_SITE_OTHER): Payer: BLUE CROSS/BLUE SHIELD | Admitting: Psychiatry

## 2018-04-05 ENCOUNTER — Encounter (HOSPITAL_COMMUNITY): Payer: Self-pay | Admitting: Psychiatry

## 2018-04-05 VITALS — BP 120/72 | HR 74 | Ht 64.0 in | Wt 200.0 lb

## 2018-04-05 DIAGNOSIS — M5124 Other intervertebral disc displacement, thoracic region: Secondary | ICD-10-CM | POA: Diagnosis not present

## 2018-04-05 DIAGNOSIS — F4312 Post-traumatic stress disorder, chronic: Secondary | ICD-10-CM

## 2018-04-05 DIAGNOSIS — M545 Low back pain: Secondary | ICD-10-CM | POA: Diagnosis not present

## 2018-04-05 DIAGNOSIS — R4586 Emotional lability: Secondary | ICD-10-CM | POA: Diagnosis not present

## 2018-04-05 DIAGNOSIS — M791 Myalgia, unspecified site: Secondary | ICD-10-CM | POA: Diagnosis not present

## 2018-04-05 DIAGNOSIS — F0630 Mood disorder due to known physiological condition, unspecified: Secondary | ICD-10-CM

## 2018-04-05 DIAGNOSIS — M5033 Other cervical disc degeneration, cervicothoracic region: Secondary | ICD-10-CM | POA: Diagnosis not present

## 2018-04-05 DIAGNOSIS — M546 Pain in thoracic spine: Secondary | ICD-10-CM | POA: Diagnosis not present

## 2018-04-05 DIAGNOSIS — F321 Major depressive disorder, single episode, moderate: Secondary | ICD-10-CM

## 2018-04-05 MED ORDER — CITALOPRAM HYDROBROMIDE 40 MG PO TABS: 40.0000 mg | ORAL_TABLET | Freq: Every day | ORAL | 0 refills | Status: DC

## 2018-04-05 MED ORDER — BUPROPION HCL ER (XL) 300 MG PO TB24: 300.0000 mg | ORAL_TABLET | Freq: Every day | ORAL | 0 refills | Status: DC

## 2018-04-05 NOTE — Progress Notes (Signed)
BH MD/PA/NP OP Progress Note  04/05/2018 9:41 AM [name]  MRN:  [id number]  Chief Complaint:  still fatigued  HPI: [name] continues to struggle with fatigue, we agreed to increase Wellbutrin to 300 mg extended release given that she has not had any side effects to report.  She has started back at work for the year, and reports it is good to get back into her routine.  She had a fairly difficult summer with multiple family illnesses and her aunt passed away.  She is receptive to working on some behavioral habits including increased exercise.  She denies any other acute concerns or safety issues and we will follow-up in 2 months  She continues to struggle with chronic headaches and has been working with her primary care doctor on headache control.  I suggested she follow-up with neurology to work on headaches and chronic fatigue as well  Visit Diagnosis:    ICD-10-CM   1. Chronic post-traumatic stress disorder (PTSD) F43.12 Ambulatory referral to Neurology    buPROPion (WELLBUTRIN XL) 300 MG 24 hr tablet    citalopram (CELEXA) 40 MG tablet  2. Emotional lability R45.86 Ambulatory referral to Neurology    citalopram (CELEXA) 40 MG tablet  3. Mood disorder in conditions classified elsewhere F06.30 Ambulatory referral to Neurology    buPROPion (WELLBUTRIN XL) 300 MG 24 hr tablet  4. Major depressive disorder, single episode, moderate (HCC) F32.1 buPROPion (WELLBUTRIN XL) 300 MG 24 hr tablet    citalopram (CELEXA) 40 MG tablet    Past Psychiatric History: See intake H&P for full details. Reviewed, with no updates at this time.  Past Medical History:  Past Medical History:  Diagnosis Date  . Allergy   . Anemia   . Anxiety   . Anxiety state, unspecified 12/07/2012  . Arthritis   . Asthma   . Bipolar disorder, unspecified ([name]) 12/07/2012  . Depression   . Diabetes mellitus without complication ([name])    no meds currently- controlled with diet  . GERD (gastroesophageal  reflux disease)   . GERD (gastroesophageal reflux disease) 12/07/2012  . Headache(784.0)    migraines  . Herpes genitalis in women 12/07/2012  . HIV infection ([name])   . Human immunodeficiency virus (HIV) disease ([name]) 12/07/2012  . Neuromuscular disorder ([place])    carpal tunnel in both arms  . Type II or unspecified type diabetes mellitus without mention of complication, not stated as uncontrolled 12/07/2012  . Unspecified asthma(493.90) 12/07/2012    Past Surgical History:  Procedure Laterality Date  . CESAREAN SECTION    . COLON SURGERY     colon repair after salpingo  . INSERTION OF MESH N/A 11/06/2014   Procedure: INSERTION OF MESH;  Surgeon: Stark Klein, MD;  [facility];  Service: General;  Laterality: N/A;  . LAPAROSCOPIC HEPATECTOMY N/A 06/28/2013   Procedure: LAPAROSCOPIC EXCISION HEPATIC MASS;  Surgeon: Stark Klein, MD;  Location: [name];  Service: General;  Laterality: N/A;  . LAPAROSCOPIC LYSIS OF ADHESIONS N/A 12/04/2012   Procedure: LAPAROSCOPIC LYSIS OF ADHESIONS;  Surgeon: [name], MD;  Location: [name];  Service: Gynecology;  Laterality: N/A;  . LAPAROTOMY N/A 12/04/2012   Procedure: EXPLORATORY LAPAROTOMY;  Surgeon: [name], MD;  Location: [name];  Service: Gynecology;  Laterality: N/A;  repair of serosal injury  . ROBOTIC ASSISTED BILATERAL SALPINGO OOPHERECTOMY Right 12/04/2012   Procedure: ROBOTIC ASSISTED Right  SALPINGO OOPHORECTOMY;  Surgeon: [name], MD;  Location: [name];  Service: Gynecology;  Laterality: Right;  . VENTRAL HERNIA REPAIR N/A 11/06/2014   Procedure: LAPAROSCOPIC ASSITED INCARCERATED VENTRAL HERNIA REPAIR WITH MESH;  Surgeon: Stark Klein, MD;  [facility];  Service: General;  Laterality: N/A;    Family Psychiatric History: See intake H&P for full details. Reviewed, with no updates at this time.   Family History:  Family History  Problem Relation Age of Onset  . Arthritis/Rheumatoid Mother   . Hypertension  Mother   . Cancer Maternal Aunt        brand and lung  . Cancer Maternal Grandmother        breasts  . Cancer Paternal Grandmother        breast and kidney    Social History:  Social History   Socioeconomic History  . Marital status: Single    Spouse name: Not on file  . Number of children: 1  . Years of education: 83  . Highest education level: Not on file  Occupational History  . Not on file  Social Needs  . Financial resource strain: Not on file  . Food insecurity:    Worry: Not on file    Inability: Not on file  . Transportation needs:    Medical: Not on file    Non-medical: Not on file  Tobacco Use  . Smoking status: Never Smoker  . Smokeless tobacco: Never Used  Substance and Sexual Activity  . Alcohol use: No    Alcohol/week: 0.0 standard drinks    Comment: OCC.about once a month   . Drug use: No  . Sexual activity: Never    Partners: Male    Birth control/protection: Implant  Lifestyle  . Physical activity:    Days per week: Not on file    Minutes per session: Not on file  . Stress: Not on file  Relationships  . Social connections:    Talks on phone: Not on file    Gets together: Not on file    Attends religious service: Not on file    Active member of club or organization: Not on file    Attends meetings of clubs or organizations: Not on file    Relationship status: Not on file  Other Topics Concern  . Not on file  Social History Narrative   Patient lives alone.   Patient works at home.   Patient has hs education.   Patient has 1 child.   Patient drinks multiple energy drinks and coffees throughout day, daily.    Allergies:  Allergies  Allergen Reactions  . Anette Guarneri [Lurasidone Hcl] Other (See Comments)    Occular dystonia  . Seroquel [Quetiapine Fumarate] Swelling    Tongue swells   . Benzoin Other (See Comments)    blisters  . Latex Rash and Other (See Comments)  . Sulfamethoxazole-Trimethoprim Itching and Nausea And Vomiting     Metabolic Disorder Labs: Lab Results  Component Value Date   HGBA1C 5.3 08/19/2017   No results found for: PROLACTIN Lab Results  Component Value Date   CHOL 213 (H) 11/03/2017   TRIG 136 11/03/2017   HDL 43 11/03/2017   CHOLHDL 5.0 (H) 11/03/2017   VLDL 25 04/06/2017   LDLCALC 143 (H) 11/03/2017   LDLCALC 120 (H) 09/02/2017   Lab Results  Component Value Date   TSH 0.735 11/03/2017   TSH 1.29 09/13/2016    Therapeutic Level Labs: No results found for: LITHIUM No results found for: VALPROATE No components found for:  CBMZ  Current Medications: Current Outpatient  Medications  Medication Sig Dispense Refill  . albuterol (PROAIR HFA) 108 (90 Base) MCG/ACT inhaler INHALE TWO PUFFS INTO THE LUNGS EVERY 6 HOURS AS NEEDED FOR WHEEZING. 8.5 Inhaler 3  . albuterol (PROVENTIL) (2.5 MG/3ML) 0.083% nebulizer solution Take 3 mLs (2.5 mg total) by nebulization every 6 (six) hours as needed for wheezing or shortness of breath (If no relief from MDI inhaler). 75 mL 3  . beclomethasone (QVAR) 40 MCG/ACT inhaler INHALE TWO PUFFS INTO THE LUNGS TWO TIMES DAILY. 8.7 Inhaler 3  . bictegravir-emtricitabine-tenofovir AF (BIKTARVY) 50-200-25 MG TABS tablet Take 1 tablet by mouth daily. 30 tablet 5  . cetirizine (ZYRTEC) 10 MG tablet Take 2 tablets (20 mg total) by mouth at bedtime. 60 tablet 0  . citalopram (CELEXA) 40 MG tablet Take 1 tablet (40 mg total) by mouth daily. 90 tablet 0  . DENTA 5000 PLUS 1.1 % CREA dental cream USE SMALL AMOUNT ON TEETH AND GUMS EVERY EVENING SPIT OUT EXCESS AND DO NOT RINSE  3  . lubiprostone (AMITIZA) 8 MCG capsule Take 8 mcg by mouth 2 (two) times daily with a meal.    . meloxicam (MOBIC) 15 MG tablet Take 1 tablet (15 mg total) by mouth daily. 30 tablet 0  . montelukast (SINGULAIR) 10 MG tablet TAKE 1 TABLET AT BEDTIME BY MOUTH. 90 tablet 1  . naproxen (EC NAPROSYN) 500 MG EC tablet Take 1 tablet (500 mg total) by mouth 2 (two) times daily with a meal. 28  tablet 1  . oxymetazoline (AFRIN) 0.05 % nasal spray Place 1 spray into both nostrils 2 (two) times daily. For <7 days at a time.    . ranitidine (ZANTAC) 150 MG tablet Take 1 tablet (150 mg total) by mouth 2 (two) times daily. 60 tablet 0  . rizatriptan (MAXALT-MLT) 5 MG disintegrating tablet Take 2 tablets (10 mg total) by mouth as needed for migraine. May repeat in 2 hours if needed 10 tablet 0  . tiZANidine (ZANAFLEX) 4 MG capsule Take 1 capsule (4 mg total) by mouth 3 (three) times daily as needed for muscle spasms. 90 capsule 2  . Topiramate ER 100 MG CS24 Take 100 mg by mouth at bedtime. 90 each 1  . traMADol (ULTRAM) 50 MG tablet Take 1 tablet (50 mg total) by mouth every 8 (eight) hours as needed. 30 tablet 0  . traZODone (DESYREL) 100 MG tablet Take 1 tablet (100 mg total) by mouth at bedtime. Take 1-2 tablets for sleep 180 tablet 1  . triamcinolone cream (KENALOG) 0.1 % Apply 1 application topically 2 (two) times daily. 454 g 1  . buPROPion (WELLBUTRIN XL) 300 MG 24 hr tablet Take 1 tablet (300 mg total) by mouth daily. 90 tablet 0  . valACYclovir (VALTREX) 500 MG tablet TAKE 1 TABLET (500 MG TOTAL) BY MOUTH DAILY. 30 tablet 0   No current facility-administered medications for this visit.      Musculoskeletal: Strength & Muscle Tone: within normal limits Gait & Station: normal Patient leans: N/A  Psychiatric Specialty Exam: ROS  Blood pressure 120/72, pulse 74, height 5\' 4"  (1.626 m), weight 200 lb (90.7 kg).Body mass index is 34.33 kg/m.  General Appearance: Casual and Well Groomed  Eye Contact:  Good  Speech:  Clear and Coherent and Normal Rate  Volume:  Normal  Mood:  fatigued still, had a hard summer  Affect:  Congruent and Flat  Thought Process:  Coherent and Descriptions of Associations: Intact  Orientation:  Full (Time, Place,  and Person)  Thought Content: Logical   Suicidal Thoughts:  No  Homicidal Thoughts:  No  Memory:  Immediate;   Fair  Judgement:  Fair   Insight:  Fair  Psychomotor Activity:  Normal  Concentration:  Concentration: Fair  Recall:  [employer] of Knowledge: Fair  Language: Fair  Akathisia:  Negative  Handed:  Right  AIMS (if indicated): not done  Assets:  Communication Skills Desire for Improvement Financial Resources/Insurance Housing  ADL's:  Intact  Cognition: WNL  Sleep:  Good   Screenings: GAD-7     Office Visit from 12/01/2017 in [facility] for [place] from 02/28/2017 in [facility] for Infectious Disease Procedure visit from 10/28/2016 in [facility] for [place] Procedure visit from 09/24/2016 in [facility] for [place]  Total GAD-7 Score  4  12  1   0    GASS     Counselor from 03/14/2017 in [facility] [place] for Infectious Disease  GASS Overall Total Score  13    MDI     Counselor from 02/28/2017 in [facility] for Infectious Disease  Total Score (max 50)  34    PHQ2-9     Office Visit from 01/23/2018 in [facility] Office Visit from 01/02/2018 in [facility] for Infectious Disease Office Visit from 12/01/2017 in [place] for [place] Procedure visit from 10/27/2017 in [facility] for [place] Office Visit from 09/09/2017 in [facility]  PHQ-2 Total Score  0  0  2  2  0  PHQ-9 Total Score  -  -  7  9  -    SBQ-R     Counselor from 03/14/2017 in [facility] for Infectious Disease  SBQ-R Total Score  13.1       Assessment and Plan:  CHARLENA HAUB would benefit from an up titration and Wellbutrin to target ongoing chronic fatigue and depressive symptoms.  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  Reviewed the risks and benefits of the increase in Wellbutrin as below and agreed to follow-up in 6 weeks.  1. Chronic post-traumatic stress disorder (PTSD)   2. Emotional lability   3. Mood disorder in conditions classified elsewhere   4. Major depressive disorder, single episode, moderate (HCC)     Status of current problems: unchanged  Labs Ordered: Orders Placed This Encounter  Procedures  . Ambulatory referral to Neurology    Referral Priority:   Routine    Referral Type:   Consultation    Referral Reason:   Specialty Services Required    Requested Specialty:   Neurology    Number of Visits Requested:   1    Labs Reviewed: n/a  Collateral Obtained/Records Reviewed: n/a  Plan:  Celexa 40 mg daily Wellbutrin 300 mg XL daily Okay to use trazodone 100 mg at bedtime for sleep  Aundra Dubin, MD 04/05/2018, 9:41 AM

## 2018-04-10 ENCOUNTER — Encounter: Payer: Self-pay | Admitting: Neurology

## 2018-04-10 ENCOUNTER — Ambulatory Visit (INDEPENDENT_AMBULATORY_CARE_PROVIDER_SITE_OTHER): Payer: BLUE CROSS/BLUE SHIELD | Admitting: Family Medicine

## 2018-04-10 ENCOUNTER — Other Ambulatory Visit: Payer: Self-pay | Admitting: Infectious Diseases

## 2018-04-10 ENCOUNTER — Encounter: Payer: Self-pay | Admitting: Family Medicine

## 2018-04-10 VITALS — BP 118/82 | HR 95 | Temp 98.1°F | Ht 64.0 in | Wt 204.0 lb

## 2018-04-10 DIAGNOSIS — Z23 Encounter for immunization: Secondary | ICD-10-CM

## 2018-04-10 DIAGNOSIS — B20 Human immunodeficiency virus [HIV] disease: Secondary | ICD-10-CM

## 2018-04-10 DIAGNOSIS — Z889 Allergy status to unspecified drugs, medicaments and biological substances status: Secondary | ICD-10-CM | POA: Diagnosis not present

## 2018-04-10 DIAGNOSIS — Z09 Encounter for follow-up examination after completed treatment for conditions other than malignant neoplasm: Secondary | ICD-10-CM

## 2018-04-10 DIAGNOSIS — N898 Other specified noninflammatory disorders of vagina: Secondary | ICD-10-CM

## 2018-04-10 LAB — POCT URINALYSIS DIP (MANUAL ENTRY)
Bilirubin, UA: NEGATIVE
Blood, UA: NEGATIVE
Glucose, UA: NEGATIVE mg/dL
Ketones, POC UA: NEGATIVE mg/dL
Leukocytes, UA: NEGATIVE
Nitrite, UA: NEGATIVE
Protein Ur, POC: NEGATIVE mg/dL
Spec Grav, UA: >=1.03 — AB (ref 1.010–1.025)
Urobilinogen, UA: 0.2 E.U./dL
pH, UA: 5.5 (ref 5.0–8.0)

## 2018-04-10 MED ORDER — CETIRIZINE HCL 10 MG PO TABS: 20.0000 mg | ORAL_TABLET | Freq: Every day | ORAL | 0 refills | Status: AC

## 2018-04-10 NOTE — Progress Notes (Signed)
Sick Visit  Subjective:    Patient ID: [name], female    DOB: [birth year], 37 y.o.   MRN: [id number]  Chief Complaint  Patient presents with  . Vaginal Itching    HPI  Ms. [name] is a 37 year old female with a past medical history of Diabetes, Neuromuscular Disorder, HIV, Herpes Genitalis in Women, Headache, GERD, Depression, Bipolar Disorder, Asthma, Arthritis Anxiety, Anemia, and Allergies. She is here today for a sick visit.   Current Status: Since her last office visit, she is doing well. She complaints of vaginal itching. She states that she has not had any vaginal pain or discharge.   She denies fevers, chills, fatigue, recent infections, weight loss, and night sweats.   She has not had any headaches, visual changes, dizziness, and falls.   No chest pain, heart palpitations, cough and shortness of breath reported.   No reports of GI problems such as nausea, vomiting, diarrhea, and constipation. She has no reports of blood in stools, dysuria and hematuria.   No depression or anxiety reported.   She denies pain today.   Past Medical History:  Diagnosis Date  . Allergy   . Anemia   . Anxiety   . Anxiety state, unspecified 12/07/2012  . Arthritis   . Asthma   . Bipolar disorder, unspecified ([place]) 12/07/2012  . Depression   . Diabetes mellitus without complication ([place])    no meds currently- controlled with diet  . GERD (gastroesophageal reflux disease)   . GERD (gastroesophageal reflux disease) 12/07/2012  . Headache(784.0)    migraines  . Herpes genitalis in women 12/07/2012  . HIV infection ([place])   . Human immunodeficiency virus (HIV) disease ([place]) 12/07/2012  . Neuromuscular disorder ([place])    carpal tunnel in both arms  . Type II or unspecified type diabetes mellitus without mention of complication, not stated as uncontrolled 12/07/2012  . Unspecified asthma(493.90) 12/07/2012    Family History  Problem Relation Age of Onset  . Arthritis/Rheumatoid Mother    . Hypertension Mother   . Cancer Maternal Aunt        brand and lung  . Cancer Maternal Grandmother        breasts  . Cancer Paternal Grandmother        breast and kidney    Social History   Socioeconomic History  . Marital status: Single    Spouse name: Not on file  . Number of children: 1  . Years of education: 66  . Highest education level: Not on file  Occupational History  . Not on file  Social Needs  . Financial resource strain: Not on file  . Food insecurity:    Worry: Not on file    Inability: Not on file  . Transportation needs:    Medical: Not on file    Non-medical: Not on file  Tobacco Use  . Smoking status: Never Smoker  . Smokeless tobacco: Never Used  Substance and Sexual Activity  . Alcohol use: No    Alcohol/week: 0.0 standard drinks    Comment: OCC.about once a month   . Drug use: No  . Sexual activity: Never    Partners: Male    Birth control/protection: Implant  Lifestyle  . Physical activity:    Days per week: Not on file    Minutes per session: Not on file  . Stress: Not on file  Relationships  . Social connections:    Talks on phone: Not on file  Gets together: Not on file    Attends religious service: Not on file    Active member of club or organization: Not on file    Attends meetings of clubs or organizations: Not on file    Relationship status: Not on file  . Intimate partner violence:    Fear of current or ex partner: Not on file    Emotionally abused: Not on file    Physically abused: Not on file    Forced sexual activity: Not on file  Other Topics Concern  . Not on file  Social History Narrative   Patient lives alone.   Patient works at home.   Patient has hs education.   Patient has 1 child.   Patient drinks multiple energy drinks and coffees throughout day, daily.     Past Surgical History:  Procedure Laterality Date  . CESAREAN SECTION    . COLON SURGERY     colon repair after salpingo  . INSERTION OF MESH  N/A 11/06/2014   Procedure: INSERTION OF MESH;  Surgeon: Stark Klein, MD;  [facility];  Service: General;  Laterality: N/A;  . LAPAROSCOPIC HEPATECTOMY N/A 06/28/2013   Procedure: LAPAROSCOPIC EXCISION HEPATIC MASS;  Surgeon: Stark Klein, MD;  Location: [facility];  Service: General;  Laterality: N/A;  . LAPAROSCOPIC LYSIS OF ADHESIONS N/A 12/04/2012   Procedure: LAPAROSCOPIC LYSIS OF ADHESIONS;  Surgeon: [name], MD;  Location: [facility];  Service: Gynecology;  Laterality: N/A;  . LAPAROTOMY N/A 12/04/2012   Procedure: EXPLORATORY LAPAROTOMY;  Surgeon: [name], MD;  Location: [facility];  Service: Gynecology;  Laterality: N/A;  repair of serosal injury  . ROBOTIC ASSISTED BILATERAL SALPINGO OOPHERECTOMY Right 12/04/2012   Procedure: ROBOTIC ASSISTED Right  SALPINGO OOPHORECTOMY;  Surgeon: [name], MD;  Location: [facility];  Service: Gynecology;  Laterality: Right;  . VENTRAL HERNIA REPAIR N/A 11/06/2014   Procedure: LAPAROSCOPIC ASSITED INCARCERATED VENTRAL HERNIA REPAIR WITH MESH;  Surgeon: Stark Klein, MD;  [facility];  Service: General;  Laterality: N/A;    Immunization History  Administered Date(s) Administered  . Hepatitis A, Adult 08/03/2017  . Hepatitis B 03/20/2008, 09/25/2008, 06/03/2009  . Hepatitis B, adult 06/19/2014, 08/05/2014, 01/15/2015  . Influenza Split 08/02/2012  . Influenza Whole 06/13/2008, 06/03/2009, 06/01/2010  . Influenza,inj,Quad PF,6+ Mos 05/03/2013, 06/12/2014, 04/26/2016, 05/31/2017, 04/10/2018  . Meningococcal Mcv4o 02/07/2017  . PPD Test 05/31/2014  . Pneumococcal Polysaccharide-23 03/20/2008, 05/03/2013  . Tdap 08/02/2012    Current Meds  Medication Sig  . albuterol (PROAIR HFA) 108 (90 Base) MCG/ACT inhaler INHALE TWO PUFFS INTO THE LUNGS EVERY 6 HOURS AS NEEDED FOR WHEEZING.  Marland Kitchen albuterol (PROVENTIL) (2.5 MG/3ML) 0.083% nebulizer solution Take 3 mLs (2.5 mg total) by nebulization every 6 (six) hours as needed for wheezing  or shortness of breath (If no relief from MDI inhaler).  . beclomethasone (QVAR) 40 MCG/ACT inhaler INHALE TWO PUFFS INTO THE LUNGS TWO TIMES DAILY.  Marland Kitchen BIKTARVY 50-200-25 MG TABS tablet TAKE 1 TABLET BY MOUTH DAILY  . buPROPion (WELLBUTRIN XL) 300 MG 24 hr tablet Take 1 tablet (300 mg total) by mouth daily.  . cetirizine (ZYRTEC) 10 MG tablet Take 2 tablets (20 mg total) by mouth at bedtime.  . citalopram (CELEXA) 40 MG tablet Take 1 tablet (40 mg total) by mouth daily.  . DENTA 5000 PLUS 1.1 % CREA dental cream USE SMALL AMOUNT ON TEETH AND GUMS EVERY EVENING SPIT OUT EXCESS AND DO NOT RINSE  . lubiprostone (AMITIZA) 8 MCG  capsule Take 8 mcg by mouth 2 (two) times daily with a meal.  . meloxicam (MOBIC) 15 MG tablet Take 1 tablet (15 mg total) by mouth daily.  . montelukast (SINGULAIR) 10 MG tablet TAKE 1 TABLET AT BEDTIME BY MOUTH.  . naproxen (EC NAPROSYN) 500 MG EC tablet Take 1 tablet (500 mg total) by mouth 2 (two) times daily with a meal.  . oxymetazoline (AFRIN) 0.05 % nasal spray Place 1 spray into both nostrils 2 (two) times daily. For <7 days at a time.  . ranitidine (ZANTAC) 150 MG tablet Take 1 tablet (150 mg total) by mouth 2 (two) times daily.  . rizatriptan (MAXALT-MLT) 5 MG disintegrating tablet Take 2 tablets (10 mg total) by mouth as needed for migraine. May repeat in 2 hours if needed  . tiZANidine (ZANAFLEX) 4 MG capsule Take 1 capsule (4 mg total) by mouth 3 (three) times daily as needed for muscle spasms.  . Topiramate ER 100 MG CS24 Take 100 mg by mouth at bedtime.  . traMADol (ULTRAM) 50 MG tablet Take 1 tablet (50 mg total) by mouth every 8 (eight) hours as needed.  . traZODone (DESYREL) 100 MG tablet Take 1 tablet (100 mg total) by mouth at bedtime. Take 1-2 tablets for sleep  . triamcinolone cream (KENALOG) 0.1 % Apply 1 application topically 2 (two) times daily.  . valACYclovir (VALTREX) 500 MG tablet TAKE 1 TABLET (500 MG TOTAL) BY MOUTH DAILY.  . [DISCONTINUED]  cetirizine (ZYRTEC) 10 MG tablet Take 2 tablets (20 mg total) by mouth at bedtime.    Allergies  Allergen Reactions  . Anette Guarneri [Lurasidone Hcl] Other (See Comments)    Occular dystonia  . Seroquel [Quetiapine Fumarate] Swelling    Tongue swells   . Benzoin Other (See Comments)    blisters  . Latex Rash and Other (See Comments)  . Sulfamethoxazole-Trimethoprim Itching and Nausea And Vomiting    BP 118/82 (BP Location: Right Arm, Patient Position: Sitting, Cuff Size: Large)   Pulse 95   Temp 98.1 F (36.7 C) (Oral)   Ht 5\' 4"  (1.626 m)   Wt 204 lb (92.5 kg)   SpO2 99%   BMI 35.02 kg/m    Review of Systems  Constitutional: Negative.   HENT: Negative.   Eyes: Negative.   Respiratory: Positive for cough.   Cardiovascular: Negative.   Gastrointestinal: Negative.   Endocrine: Negative.   Genitourinary: Negative.   Musculoskeletal: Negative.   Skin: Negative.   Allergic/Immunologic: Negative.   Neurological: Negative.   Hematological: Negative.   Psychiatric/Behavioral: Negative.    Objective:   Physical Exam  Constitutional: She is oriented to person, place, and time. She appears well-developed and well-nourished.  HENT:  Head: Normocephalic and atraumatic.  Right Ear: External ear normal.  Left Ear: External ear normal.  Nose: Nose normal.  Mouth/Throat: Oropharynx is clear and moist.  Eyes: Pupils are equal, round, and reactive to light. Conjunctivae and EOM are normal.  Neck: Normal range of motion. Neck supple.  Cardiovascular: Normal rate, regular rhythm, normal heart sounds and intact distal pulses.  Pulmonary/Chest: Effort normal and breath sounds normal.  Abdominal: Soft. Bowel sounds are normal.  Musculoskeletal: Normal range of motion.  Neurological: She is alert and oriented to person, place, and time.  Skin: Skin is warm and dry. Capillary refill takes less than 2 seconds.  Psychiatric: She has a normal mood and affect. Her behavior is normal. Judgment  and thought content normal.  Nursing note and vitals reviewed.  Assessment & Plan:   1. Vaginal itching Urinalysis is stable today. Pending Vaginitis Probe. - POCT urinalysis dipstick - Vaginitis/Vaginosis, DNA Probe  2. Multiple allergies - cetirizine (ZYRTEC) 10 MG tablet; Take 2 tablets (20 mg total) by mouth at bedtime.  Dispense: 60 tablet; Refill: 0  3. Need for immunization against influenza - Flu Vaccine QUAD 36+ mos IM  4. Follow up She will follow up in 2 months.   Meds ordered this encounter  Medications  . cetirizine (ZYRTEC) 10 MG tablet    Sig: Take 2 tablets (20 mg total) by mouth at bedtime.    Dispense:  60 tablet    Refill:  0   Kathe Becton,  MSN, FNP-C [facility] [address] [place], [place] [address] [phone]

## 2018-04-11 DIAGNOSIS — M791 Myalgia, unspecified site: Secondary | ICD-10-CM | POA: Diagnosis not present

## 2018-04-11 DIAGNOSIS — M5033 Other cervical disc degeneration, cervicothoracic region: Secondary | ICD-10-CM | POA: Diagnosis not present

## 2018-04-11 DIAGNOSIS — M546 Pain in thoracic spine: Secondary | ICD-10-CM | POA: Diagnosis not present

## 2018-04-11 DIAGNOSIS — M542 Cervicalgia: Secondary | ICD-10-CM | POA: Diagnosis not present

## 2018-04-11 DIAGNOSIS — M5124 Other intervertebral disc displacement, thoracic region: Secondary | ICD-10-CM | POA: Diagnosis not present

## 2018-04-12 LAB — VAGINITIS/VAGINOSIS, DNA PROBE
Candida Species: NEGATIVE
Gardnerella vaginalis: NEGATIVE
Trichomonas vaginosis: NEGATIVE

## 2018-04-15 ENCOUNTER — Encounter (HOSPITAL_COMMUNITY): Payer: Self-pay

## 2018-04-18 ENCOUNTER — Ambulatory Visit: Payer: Self-pay | Admitting: Family Medicine

## 2018-04-18 ENCOUNTER — Telehealth: Payer: Self-pay

## 2018-04-18 NOTE — Telephone Encounter (Signed)
-----   Message from [name], [place] sent at 04/14/2018  8:34 PM EDT ----- Regarding: "Results" [name],   Please call patient and inform her that Swab for BV was negative. Please remind her to keep her follow up appointment.   Thank you.

## 2018-04-18 NOTE — Telephone Encounter (Signed)
-----   Message from [name], [place] sent at 04/14/2018  8:34 PM EDT ----- Regarding: "Results" [name],   Please call patient and inform her that Swab for BV was negative. Please remind her to keep her follow up appointment.   Thank you.

## 2018-04-18 NOTE — Telephone Encounter (Signed)
-----   Message from [name], [place] sent at 04/14/2018  8:34 PM EDT ----- Regarding: "Results" [name],   Please call patient and inform her that Swab for BV was negative. Please remind her to keep her follow up appointment.   Thank you.

## 2018-04-18 NOTE — Telephone Encounter (Signed)
Left a vm for patient to callback regarding results

## 2018-04-19 DIAGNOSIS — M5033 Other cervical disc degeneration, cervicothoracic region: Secondary | ICD-10-CM | POA: Diagnosis not present

## 2018-04-19 DIAGNOSIS — M5124 Other intervertebral disc displacement, thoracic region: Secondary | ICD-10-CM | POA: Diagnosis not present

## 2018-04-19 DIAGNOSIS — M546 Pain in thoracic spine: Secondary | ICD-10-CM | POA: Diagnosis not present

## 2018-04-19 DIAGNOSIS — M791 Myalgia, unspecified site: Secondary | ICD-10-CM | POA: Diagnosis not present

## 2018-04-19 NOTE — Telephone Encounter (Signed)
Patient notified

## 2018-04-19 NOTE — Telephone Encounter (Signed)
-----   Message from [name], [place] sent at 04/14/2018  8:34 PM EDT ----- Regarding: "Results" [name],   Please call patient and inform her that Swab for BV was negative. Please remind her to keep her follow up appointment.   Thank you.

## 2018-04-20 DIAGNOSIS — M791 Myalgia, unspecified site: Secondary | ICD-10-CM | POA: Diagnosis not present

## 2018-04-20 DIAGNOSIS — M546 Pain in thoracic spine: Secondary | ICD-10-CM | POA: Diagnosis not present

## 2018-04-20 DIAGNOSIS — M5033 Other cervical disc degeneration, cervicothoracic region: Secondary | ICD-10-CM | POA: Diagnosis not present

## 2018-04-20 DIAGNOSIS — M5124 Other intervertebral disc displacement, thoracic region: Secondary | ICD-10-CM | POA: Diagnosis not present

## 2018-04-24 DIAGNOSIS — M546 Pain in thoracic spine: Secondary | ICD-10-CM | POA: Diagnosis not present

## 2018-04-24 DIAGNOSIS — M5124 Other intervertebral disc displacement, thoracic region: Secondary | ICD-10-CM | POA: Diagnosis not present

## 2018-04-24 DIAGNOSIS — M791 Myalgia, unspecified site: Secondary | ICD-10-CM | POA: Diagnosis not present

## 2018-04-24 DIAGNOSIS — M5033 Other cervical disc degeneration, cervicothoracic region: Secondary | ICD-10-CM | POA: Diagnosis not present

## 2018-05-01 DIAGNOSIS — M5033 Other cervical disc degeneration, cervicothoracic region: Secondary | ICD-10-CM | POA: Diagnosis not present

## 2018-05-01 DIAGNOSIS — M5124 Other intervertebral disc displacement, thoracic region: Secondary | ICD-10-CM | POA: Diagnosis not present

## 2018-05-01 DIAGNOSIS — M791 Myalgia, unspecified site: Secondary | ICD-10-CM | POA: Diagnosis not present

## 2018-05-01 DIAGNOSIS — M546 Pain in thoracic spine: Secondary | ICD-10-CM | POA: Diagnosis not present

## 2018-05-01 DIAGNOSIS — M545 Low back pain: Secondary | ICD-10-CM | POA: Diagnosis not present

## 2018-05-04 DIAGNOSIS — M5124 Other intervertebral disc displacement, thoracic region: Secondary | ICD-10-CM | POA: Diagnosis not present

## 2018-05-04 DIAGNOSIS — M5033 Other cervical disc degeneration, cervicothoracic region: Secondary | ICD-10-CM | POA: Diagnosis not present

## 2018-05-04 DIAGNOSIS — M546 Pain in thoracic spine: Secondary | ICD-10-CM | POA: Diagnosis not present

## 2018-05-04 DIAGNOSIS — M791 Myalgia, unspecified site: Secondary | ICD-10-CM | POA: Diagnosis not present

## 2018-05-08 DIAGNOSIS — F3341 Major depressive disorder, recurrent, in partial remission: Secondary | ICD-10-CM | POA: Diagnosis not present

## 2018-05-09 DIAGNOSIS — M545 Low back pain: Secondary | ICD-10-CM | POA: Diagnosis not present

## 2018-05-09 DIAGNOSIS — M5033 Other cervical disc degeneration, cervicothoracic region: Secondary | ICD-10-CM | POA: Diagnosis not present

## 2018-05-09 DIAGNOSIS — M791 Myalgia, unspecified site: Secondary | ICD-10-CM | POA: Diagnosis not present

## 2018-05-09 DIAGNOSIS — M546 Pain in thoracic spine: Secondary | ICD-10-CM | POA: Diagnosis not present

## 2018-05-09 DIAGNOSIS — M5124 Other intervertebral disc displacement, thoracic region: Secondary | ICD-10-CM | POA: Diagnosis not present

## 2018-05-11 DIAGNOSIS — M5124 Other intervertebral disc displacement, thoracic region: Secondary | ICD-10-CM | POA: Diagnosis not present

## 2018-05-11 DIAGNOSIS — M791 Myalgia, unspecified site: Secondary | ICD-10-CM | POA: Diagnosis not present

## 2018-05-11 DIAGNOSIS — M545 Low back pain: Secondary | ICD-10-CM | POA: Diagnosis not present

## 2018-05-11 DIAGNOSIS — M5033 Other cervical disc degeneration, cervicothoracic region: Secondary | ICD-10-CM | POA: Diagnosis not present

## 2018-05-17 DIAGNOSIS — M5136 Other intervertebral disc degeneration, lumbar region: Secondary | ICD-10-CM | POA: Diagnosis not present

## 2018-05-17 DIAGNOSIS — M5126 Other intervertebral disc displacement, lumbar region: Secondary | ICD-10-CM | POA: Diagnosis not present

## 2018-05-17 DIAGNOSIS — M791 Myalgia, unspecified site: Secondary | ICD-10-CM | POA: Diagnosis not present

## 2018-05-17 DIAGNOSIS — M545 Low back pain: Secondary | ICD-10-CM | POA: Diagnosis not present

## 2018-05-22 DIAGNOSIS — M791 Myalgia, unspecified site: Secondary | ICD-10-CM | POA: Diagnosis not present

## 2018-05-22 DIAGNOSIS — M5126 Other intervertebral disc displacement, lumbar region: Secondary | ICD-10-CM | POA: Diagnosis not present

## 2018-05-22 DIAGNOSIS — M545 Low back pain: Secondary | ICD-10-CM | POA: Diagnosis not present

## 2018-05-22 DIAGNOSIS — M5136 Other intervertebral disc degeneration, lumbar region: Secondary | ICD-10-CM | POA: Diagnosis not present

## 2018-06-01 DIAGNOSIS — M542 Cervicalgia: Secondary | ICD-10-CM | POA: Diagnosis not present

## 2018-06-01 DIAGNOSIS — M791 Myalgia, unspecified site: Secondary | ICD-10-CM | POA: Diagnosis not present

## 2018-06-01 DIAGNOSIS — M5126 Other intervertebral disc displacement, lumbar region: Secondary | ICD-10-CM | POA: Diagnosis not present

## 2018-06-01 DIAGNOSIS — M5136 Other intervertebral disc degeneration, lumbar region: Secondary | ICD-10-CM | POA: Diagnosis not present

## 2018-06-01 DIAGNOSIS — M545 Low back pain: Secondary | ICD-10-CM | POA: Diagnosis not present

## 2018-06-05 ENCOUNTER — Ambulatory Visit: Payer: BLUE CROSS/BLUE SHIELD | Admitting: Family Medicine

## 2018-06-05 ENCOUNTER — Encounter: Payer: Self-pay | Admitting: Family Medicine

## 2018-06-05 ENCOUNTER — Ambulatory Visit (INDEPENDENT_AMBULATORY_CARE_PROVIDER_SITE_OTHER): Payer: BLUE CROSS/BLUE SHIELD | Admitting: Neurology

## 2018-06-05 ENCOUNTER — Encounter: Payer: Self-pay | Admitting: Neurology

## 2018-06-05 VITALS — BP 110/68 | HR 80 | Ht 64.0 in | Wt 201.0 lb

## 2018-06-05 VITALS — BP 123/88 | HR 80 | Wt 200.9 lb

## 2018-06-05 DIAGNOSIS — G43709 Chronic migraine without aura, not intractable, without status migrainosus: Secondary | ICD-10-CM | POA: Diagnosis not present

## 2018-06-05 DIAGNOSIS — N939 Abnormal uterine and vaginal bleeding, unspecified: Secondary | ICD-10-CM | POA: Diagnosis not present

## 2018-06-05 DIAGNOSIS — A6000 Herpesviral infection of urogenital system, unspecified: Secondary | ICD-10-CM | POA: Diagnosis not present

## 2018-06-05 DIAGNOSIS — R87612 Low grade squamous intraepithelial lesion on cytologic smear of cervix (LGSIL): Secondary | ICD-10-CM

## 2018-06-05 DIAGNOSIS — G43109 Migraine with aura, not intractable, without status migrainosus: Secondary | ICD-10-CM

## 2018-06-05 DIAGNOSIS — B20 Human immunodeficiency virus [HIV] disease: Secondary | ICD-10-CM | POA: Diagnosis not present

## 2018-06-05 DIAGNOSIS — B009 Herpesviral infection, unspecified: Secondary | ICD-10-CM

## 2018-06-05 LAB — POCT URINALYSIS DIP (DEVICE)
BILIRUBIN URINE: NEGATIVE
GLUCOSE, UA: NEGATIVE mg/dL
Ketones, ur: NEGATIVE mg/dL
LEUKOCYTES UA: NEGATIVE
NITRITE: NEGATIVE
Protein, ur: NEGATIVE mg/dL
Specific Gravity, Urine: 1.015 (ref 1.005–1.030)
UROBILINOGEN UA: 1 mg/dL (ref 0.0–1.0)
pH: 5.5 (ref 5.0–8.0)

## 2018-06-05 MED ORDER — TOPIRAMATE ER 50 MG PO SPRINKLE CAP24: 50.0000 mg | EXTENDED_RELEASE_CAPSULE | Freq: Every day | ORAL | 0 refills | Status: DC

## 2018-06-05 MED ORDER — TOPIRAMATE ER 100 MG PO SPRINKLE CAP24: 100.0000 mg | EXTENDED_RELEASE_CAPSULE | Freq: Every day | ORAL | 3 refills | Status: DC

## 2018-06-05 MED ORDER — NORETHIN ACE-ETH ESTRAD-FE 1-20 MG-MCG PO TABS: 1.0000 | ORAL_TABLET | Freq: Every day | ORAL | 3 refills | Status: DC

## 2018-06-05 MED ORDER — ELETRIPTAN HYDROBROMIDE 40 MG PO TABS: ORAL_TABLET | ORAL | 3 refills | Status: DC

## 2018-06-05 MED ORDER — VALACYCLOVIR HCL 500 MG PO TABS: 1000.0000 mg | ORAL_TABLET | Freq: Two times a day (BID) | ORAL | 0 refills | Status: DC

## 2018-06-05 NOTE — Progress Notes (Signed)
Had new Nexplanon placed this past spring. Started having irregular bleeding that is heavy for several days and then light. Had this with prior nexplanon and put on BCPs too to help with irreg bleeding. Having some irritation inside of L labia. Also had burning when voided couple days last wk but unsure if dysuria or where urine hit irritated area on vagina. Would like hysterectomy

## 2018-06-05 NOTE — Progress Notes (Signed)
NEUROLOGY CONSULTATION NOTE  [name] MRN: [id number] DOB: 07-12-1981  Referring provider: Lulu Riding, MD Primary care provider: Kathe Becton, FNP  Reason for consult:  headache  HISTORY OF PRESENT ILLNESS: [name] is a 37 year old female with depression, PTSD, HIV infection and asthma who presents for headache.  History supplemented by referring providers note.  Onset:  37 years old Location:  Varies:  Sometimes right-sided;  Bilateral behind eyes; bilateral back of head Quality:  squeezing Intensity:  Moderate to severe.  She denies new headache, thunderclap headache or severe headache that wakes her from sleep. Aura:  Sometimes sees colors with squiggly lines (sometimes without headahce) Prodrome:  no Postdrome:  no Associated symptoms:  Nausea (starts before headache), sometimes vomit, photophobia, phonophobia, osmophobia, blurred vision.  She denies associated unilateral numbness or weakness. Duration:  40-60 minutes severe, but can last all day Frequency:  daily Frequency of abortive medication: Excedrin Migraine daily for headache Triggers:  no Relieving factors:  Peppermint oil, quiet room Activity:  aggravates  Current NSAIDS:  naproxen 500mg  (for back pain), ibuprofen Current analgesics:  Excedrin Migraine Extra-strength Current triptans:  no Current ergotamine:  none Current anti-emetic:  none Current muscle relaxants:  Tizanidine 4mg  Current anti-anxiolytic:  none Current sleep aide:  trazodone Current Antihypertensive medications:  none Current Antidepressant medications:  Citalopram 40mg , Wellbutrin XL 300mg  Current Anticonvulsant medications:  no Current anti-CGRP:  none Current Vitamins/Herbal/Supplements:  none Current Antihistamines/Decongestants:  none Other therapy:  none  Past NSAIDS:  Ibuprofen 800mg  Past analgesics:  Fioricet, tramadol Past abortive triptans:  Sumatriptan 100mg , Maxalt MLT 10mg  Past abortive ergotamine:   none Past muscle relaxants:  none Past anti-emetic:  none Past antihypertensive medications:  Maybe Inderal Past antidepressant medications:  Fluoxetine, amitriptyline Past anticonvulsant medications:  Lamotrigine 50mg , topiramate ER 100mg  at bedtime Past anti-CGRP:  none Past vitamins/Herbal/Supplements:  none Past antihistamines/decongestants:  none Other past therapies:  none  Caffeine:  1 cup of coffee daily Alcohol:  none Smoker:  none Diet:  5-6 bottles water daily Exercise:  She tries to Depression:  yes; Anxiety:  yes Other pain:  anxiety Sleep hygiene:  Poor.  Sleeps 3 to 5 hours a night.  She had a sleep study that showed "excessive snoring" which wakes her up but not sleep apnea. Family history of headache:  Mom, maternal aunt (passed away due to brain cancer)  She reports extreme fatigue.  11/03/17 CMP with Na 141, K 3.9, Cl 105, CO2 22, glucose 130, BUN 13, Cr 1.04, t bili 0.4, ALP 60, AST 26, ALT 34  12/12/17 CBC with WBC 8.3, HGB 13.8, HCT 40.3, PLT 252  PAST MEDICAL HISTORY: Past Medical History:  Diagnosis Date  . Allergy   . Anemia   . Anxiety   . Anxiety state, unspecified 12/07/2012  . Arthritis   . Asthma   . Bipolar disorder, unspecified ([place]) 12/07/2012  . Depression   . Diabetes mellitus without complication ([place])    no meds currently- controlled with diet  . GERD (gastroesophageal reflux disease)   . GERD (gastroesophageal reflux disease) 12/07/2012  . Headache(784.0)    migraines  . Herpes genitalis in women 12/07/2012  . HIV infection ([place])   . Human immunodeficiency virus (HIV) disease ([place]) 12/07/2012  . Neuromuscular disorder ([place])    carpal tunnel in both arms  . Type II or unspecified type diabetes mellitus without mention of complication, not stated as uncontrolled 12/07/2012  . Unspecified asthma(493.90) 12/07/2012  PAST SURGICAL HISTORY: Past Surgical History:  Procedure Laterality Date  . CESAREAN SECTION    . COLON SURGERY      colon repair after salpingo  . INSERTION OF MESH N/A 11/06/2014   Procedure: INSERTION OF MESH;  Surgeon: Stark Klein, MD;  Location: [facility];  Service: General;  Laterality: N/A;  . LAPAROSCOPIC HEPATECTOMY N/A 06/28/2013   Procedure: LAPAROSCOPIC EXCISION HEPATIC MASS;  Surgeon: Stark Klein, MD;  Location: [facility];  Service: General;  Laterality: N/A;  . LAPAROSCOPIC LYSIS OF ADHESIONS N/A 12/04/2012   Procedure: LAPAROSCOPIC LYSIS OF ADHESIONS;  Surgeon: [name], MD;  Location: [facility];  Service: Gynecology;  Laterality: N/A;  . LAPAROTOMY N/A 12/04/2012   Procedure: EXPLORATORY LAPAROTOMY;  Surgeon: [name], MD;  Location: [facility];  Service: Gynecology;  Laterality: N/A;  repair of serosal injury  . ROBOTIC ASSISTED BILATERAL SALPINGO OOPHERECTOMY Right 12/04/2012   Procedure: ROBOTIC ASSISTED Right  SALPINGO OOPHORECTOMY;  Surgeon: [name], MD;  Location: [facility];  Service: Gynecology;  Laterality: Right;  . VENTRAL HERNIA REPAIR N/A 11/06/2014   Procedure: LAPAROSCOPIC ASSITED INCARCERATED VENTRAL HERNIA REPAIR WITH MESH;  Surgeon: Stark Klein, MD;  Location: [facility];  Service: General;  Laterality: N/A;    MEDICATIONS: Current Outpatient Medications on File Prior to Visit  Medication Sig Dispense Refill  . albuterol (PROAIR HFA) 108 (90 Base) MCG/ACT inhaler INHALE TWO PUFFS INTO THE LUNGS EVERY 6 HOURS AS NEEDED FOR WHEEZING. 8.5 Inhaler 3  . albuterol (PROVENTIL) (2.5 MG/3ML) 0.083% nebulizer solution Take 3 mLs (2.5 mg total) by nebulization every 6 (six) hours as needed for wheezing or shortness of breath (If no relief from MDI inhaler). 75 mL 3  . beclomethasone (QVAR) 40 MCG/ACT inhaler INHALE TWO PUFFS INTO THE LUNGS TWO TIMES DAILY. 8.7 Inhaler 3  . BIKTARVY 50-200-25 MG TABS tablet TAKE 1 TABLET BY MOUTH DAILY 30 tablet 0  . buPROPion (WELLBUTRIN XL) 300 MG 24 hr tablet Take 1 tablet (300 mg total) by mouth daily. 90 tablet 0  . cetirizine (ZYRTEC) 10 MG  tablet Take 2 tablets (20 mg total) by mouth at bedtime. 60 tablet 0  . citalopram (CELEXA) 40 MG tablet Take 1 tablet (40 mg total) by mouth daily. 90 tablet 0  . DENTA 5000 PLUS 1.1 % CREA dental cream USE SMALL AMOUNT ON TEETH AND GUMS EVERY EVENING SPIT OUT EXCESS AND DO NOT RINSE  3  . lubiprostone (AMITIZA) 8 MCG capsule Take 8 mcg by mouth 2 (two) times daily with a meal.    . meloxicam (MOBIC) 15 MG tablet Take 1 tablet (15 mg total) by mouth daily. 30 tablet 0  . montelukast (SINGULAIR) 10 MG tablet TAKE 1 TABLET AT BEDTIME BY MOUTH. 90 tablet 1  . naproxen (EC NAPROSYN) 500 MG EC tablet Take 1 tablet (500 mg total) by mouth 2 (two) times daily with a meal. 28 tablet 1  . oxymetazoline (AFRIN) 0.05 % nasal spray Place 1 spray into both nostrils 2 (two) times daily. For <7 days at a time.    . ranitidine (ZANTAC) 150 MG tablet Take 1 tablet (150 mg total) by mouth 2 (two) times daily. 60 tablet 0  . rizatriptan (MAXALT-MLT) 5 MG disintegrating tablet Take 2 tablets (10 mg total) by mouth as needed for migraine. May repeat in 2 hours if needed 10 tablet 0  . tiZANidine (ZANAFLEX) 4 MG capsule Take 1 capsule (4 mg total) by mouth 3 (three) times daily as needed  for muscle spasms. 90 capsule 2  . Topiramate ER 100 MG CS24 Take 100 mg by mouth at bedtime. 90 each 1  . traMADol (ULTRAM) 50 MG tablet Take 1 tablet (50 mg total) by mouth every 8 (eight) hours as needed. 30 tablet 0  . traZODone (DESYREL) 100 MG tablet Take 1 tablet (100 mg total) by mouth at bedtime. Take 1-2 tablets for sleep 180 tablet 1  . triamcinolone cream (KENALOG) 0.1 % Apply 1 application topically 2 (two) times daily. 454 g 1  . valACYclovir (VALTREX) 500 MG tablet TAKE 1 TABLET (500 MG TOTAL) BY MOUTH DAILY. 30 tablet 0   No current facility-administered medications on file prior to visit.     ALLERGIES: Allergies  Allergen Reactions  . Anette Guarneri [Lurasidone Hcl] Other (See Comments)    Occular dystonia  . Seroquel  [Quetiapine Fumarate] Swelling    Tongue swells   . Benzoin Other (See Comments)    blisters  . Latex Rash and Other (See Comments)  . Sulfamethoxazole-Trimethoprim Itching and Nausea And Vomiting    FAMILY HISTORY: Family History  Problem Relation Age of Onset  . Arthritis/Rheumatoid Mother   . Hypertension Mother   . Cancer Maternal Aunt        brand and lung  . Cancer Maternal Grandmother        breasts  . Cancer Paternal Grandmother        breast and kidney   SOCIAL HISTORY: Social History   Socioeconomic History  . Marital status: Single    Spouse name: Not on file  . Number of children: 1  . Years of education: 60  . Highest education level: Not on file  Occupational History  . Not on file  Social Needs  . Financial resource strain: Not on file  . Food insecurity:    Worry: Not on file    Inability: Not on file  . Transportation needs:    Medical: Not on file    Non-medical: Not on file  Tobacco Use  . Smoking status: Never Smoker  . Smokeless tobacco: Never Used  Substance and Sexual Activity  . Alcohol use: No    Alcohol/week: 0.0 standard drinks    Comment: OCC.about once a month   . Drug use: No  . Sexual activity: Never    Partners: Male    Birth control/protection: Implant  Lifestyle  . Physical activity:    Days per week: Not on file    Minutes per session: Not on file  . Stress: Not on file  Relationships  . Social connections:    Talks on phone: Not on file    Gets together: Not on file    Attends religious service: Not on file    Active member of club or organization: Not on file    Attends meetings of clubs or organizations: Not on file    Relationship status: Not on file  . Intimate partner violence:    Fear of current or ex partner: Not on file    Emotionally abused: Not on file    Physically abused: Not on file    Forced sexual activity: Not on file  Other Topics Concern  . Not on file  Social History Narrative   Patient lives  alone.   Patient works at home.   Patient has hs education.   Patient has 1 child.   Patient drinks multiple energy drinks and coffees throughout day, daily.    REVIEW OF SYSTEMS: Constitutional: No  fevers, chills, or sweats, no generalized fatigue, change in appetite Eyes: No visual changes, double vision, eye pain Ear, nose and throat: No hearing loss, ear pain, nasal congestion, sore throat Cardiovascular: No chest pain, palpitations Respiratory:  No shortness of breath at rest or with exertion, wheezes GastrointestinaI: No nausea, vomiting, diarrhea, abdominal pain, fecal incontinence Genitourinary:  No dysuria, urinary retention or frequency Musculoskeletal:  No neck pain, back pain Integumentary: No rash, pruritus, skin lesions Neurological: as above Psychiatric: No depression, insomnia, anxiety Endocrine: No palpitations, fatigue, diaphoresis, mood swings, change in appetite, change in weight, increased thirst Hematologic/Lymphatic:  No purpura, petechiae. Allergic/Immunologic: no itchy/runny eyes, nasal congestion, recent allergic reactions, rashes  PHYSICAL EXAM: Blood pressure 110/68, pulse 80, height 5\' 4"  (1.626 m), weight 201 lb (91.2 kg), SpO2 98 %. General: No acute distress.  Patient appears well-groomed.   Head:  Normocephalic/atraumatic Eyes:  fundi examined but not visualized Neck: supple, no paraspinal tenderness, full range of motion Back: No paraspinal tenderness Heart: regular rate and rhythm Lungs: Clear to auscultation bilaterally. Vascular: No carotid bruits. Neurological Exam: Mental status: alert and oriented to person, place, and time, recent and remote memory intact, fund of knowledge intact, attention and concentration intact, speech fluent and not dysarthric, language intact. Cranial nerves: CN I: not tested CN II: pupils equal, round and reactive to light, visual fields intact CN III, IV, VI:  full range of motion, no nystagmus, no ptosis CN V:  facial sensation intact CN VII: upper and lower face symmetric CN VIII: hearing intact CN IX, X: gag intact, uvula midline CN XI: sternocleidomastoid and trapezius muscles intact CN XII: tongue midline Bulk & Tone: normal, no fasciculations. Motor:  5/5 throughout  Sensation: temperature and vibration sensation intact. Deep Tendon Reflexes:  2+ throughout, toes downgoing.   Finger to nose testing:  Without dysmetria.   Heel to shin:  Without dysmetria.   Gait:  Normal station and stride.  Able to turn and tandem walk. Romberg negative  IMPRESSION: 1.  Chronic migraine without aura, without status migrainosus, not intractable  2.  Migraine with aura, without status migrainosus, not intractable  PLAN: 1.  For preventative management, restart topiramate ER 50mg  at bedtime and increase to 100mg  at bedtime in 1 week.  Contact me in 6 weeks with update and we can either increase to 150mg  or switch to an anti-CGRP 2.  For abortive therapy, Relpax 40mg  3.  Limit use of pain relievers to no more than 2 days out of week to prevent risk of rebound or medication-overuse headache. 4.  Keep headache diary 5.  Exercise, hydration, caffeine cessation, sleep hygiene, monitor for and avoid triggers 6.  Consider:  magnesium citrate 400mg  daily, riboflavin 400mg  daily, and coenzyme Q10 100mg  three times daily 7.  Follow up in 3 to 4 months  Thank you for allowing me to take part in the care of this patient.  Metta Clines, DO  CC: Kathe Becton, NP

## 2018-06-05 NOTE — Progress Notes (Signed)
   GYNECOLOGY OFFICE VISIT NOTE History:  37 y.o. [id number] here today for discussion regarding change in birth control. She has a PMH of GERD, HIV, LGSIL, depression and anxiety, HLD, history of ovarian mass, and asthma.  - had Nexplanon replaced in March - had Nexplanon before and needed OCPs to help with irregular bleeding (Loestrin, Junel)  - now bleeding abnormal again, has been bleeding since 10/10, some heavy days with lots of clots - wants a H/S - had right salping-oophorectomy in 2014 for adnexal mass and pelvic pain  - some burning with emptying bladder last week but no longer present  - has burning of left vaginal wall  - normal H/H 4/19  - non-smoker   The following portions of the patient's history were reviewed and updated as appropriate: allergies, current medications, past family history, past medical history, past social history, past surgical history and problem list.   Health Maintenance:  Normal pap December 2018, needs repeat in 2 months. Not yet due for mammogram.    Review of Systems:  Pertinent items noted in HPI ROS  Objective:  Physical Exam BP 123/88   Pulse 80   Wt 200 lb 14.4 oz (91.1 kg)   LMP 05/21/2018   BMI 34.48 kg/m  Physical Exam  Constitutional: She is oriented to person, place, and time. She appears well-developed and well-nourished. No distress.  HENT:  Head: Normocephalic and atraumatic.  Genitourinary: Vagina normal.  Genitourinary Comments: Several clear vesicles on inner aspect of left labia, minimal vaginal discharge   Neurological: She is alert and oriented to person, place, and time.  Psychiatric: She has a normal mood and affect. Her behavior is normal.  Nursing note and vitals reviewed.  Assessment & Plan:  Sharran is a 37yo [id number] with PMH of HIV, anxiety and depression, asthma, right oophorectomy and LGSIL who presents to discuss changes in birth control.   Abnormal Vaginal Bleeding: Most likely related to Nexplanon  placement. Preference is for alternate method of birth control, amenable to IUD placement. Due for repeat Pap smear at the end of the year, so we will treat with OCPs which has helped in the past and plan for IUD placement and Nexplanon removal in about 2 months. Had U/S about 6 months ago, no evidence of fibroids. Reviewed return precautions, patient in agreement with plan.   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 Will treat with Valtrex 1000mg  BID x 7 days.   Lambert Mody. Juleen China, DO OB Family Medicine Fellow, [facility] for [employer], [place]

## 2018-06-05 NOTE — Patient Instructions (Signed)
Migraine Recommendations: 1.  Start topiramate ER 50mg  at bedtime for 1 week, then increase to 100mg  at bedtime.  Contact me in 6 weeks with update and we can adjust dose if needed. 2.  Take eletriptan 40mg  at earliest onset of headache.  May repeat dose once in 2 hours if needed.  Do not exceed two tablets in 24 hours. 3.  STOP EXCEDRIN AND IBUPROFEN.  Limit use of pain relievers to no more than 2 days out of the week.  These medications include acetaminophen, ibuprofen, triptans and narcotics.  This will help reduce risk of rebound headaches. 4.  Be aware of common food triggers such as processed sweets, processed foods with nitrites (such as deli meat, hot dogs, sausages), foods with MSG, alcohol (such as wine), chocolate, certain cheeses, certain fruits (dried fruits, bananas, pineapple), vinegar, diet soda. 4.  Avoid caffeine 5.  Routine exercise 6.  Proper sleep hygiene 7.  Stay adequately hydrated with water 8.  Keep a headache diary. 9.  Maintain proper stress management. 10.  Do not skip meals. 11.  Consider supplements:  Magnesium citrate 400mg  to 600mg  daily, riboflavin 400mg , Coenzyme Q 10 100mg  three times daily

## 2018-06-07 ENCOUNTER — Ambulatory Visit (INDEPENDENT_AMBULATORY_CARE_PROVIDER_SITE_OTHER): Payer: BLUE CROSS/BLUE SHIELD | Admitting: Family Medicine

## 2018-06-07 ENCOUNTER — Encounter: Payer: Self-pay | Admitting: Family Medicine

## 2018-06-07 VITALS — BP 116/80 | HR 84 | Temp 97.8°F | Ht 64.0 in | Wt 201.0 lb

## 2018-06-07 DIAGNOSIS — R5383 Other fatigue: Secondary | ICD-10-CM | POA: Diagnosis not present

## 2018-06-07 DIAGNOSIS — G4700 Insomnia, unspecified: Secondary | ICD-10-CM

## 2018-06-07 DIAGNOSIS — Z131 Encounter for screening for diabetes mellitus: Secondary | ICD-10-CM

## 2018-06-07 DIAGNOSIS — Z09 Encounter for follow-up examination after completed treatment for conditions other than malignant neoplasm: Secondary | ICD-10-CM

## 2018-06-07 LAB — POCT GLYCOSYLATED HEMOGLOBIN (HGB A1C): Hemoglobin A1C: 5 % (ref 4.0–5.6)

## 2018-06-07 NOTE — Progress Notes (Addendum)
Sick Visit  Subjective:    Patient ID: [name], female    DOB: [birth year], 37 y.o.   MRN: [id number]   Chief Complaint  Patient presents with  . Menstrual Problem    Bleeding since 05/20/2018  . Fatigue    HPI  [name] is a 37 year old female with a past medical history of Neuromuscular Disorder, HIV, Herpes, Headaches, GERD, Diabetes, Depression, Bipolar Disorder, Asthma, Arthritis, Anxiety, Anxiety, Anemia, and Allergies. She is here today for a sick visit.  Current Status: Since her last office visit, she has had c/o increased fatigue. She has appointment with Gynecologist, because of increased side effects of bleeding, on 06/2018. At that time, she will review other birth control options.   She denies fevers, chills, recent infections, weight loss, and night sweats. She has not had any headaches, visual changes, dizziness, and falls. No chest pain, heart palpitations, cough and shortness of breath reported. No reports of GI problems such as nausea, vomiting, diarrhea, and constipation. She has no reports of blood in stools, dysuria and hematuria. No depression or anxiety reported. She denies pain today.   Past Medical History:  Diagnosis Date  . Allergy   . Anemia   . Anxiety   . Anxiety state, unspecified 12/07/2012  . Arthritis   . Asthma   . Bipolar disorder, unspecified ([place]) 12/07/2012  . Depression   . Diabetes mellitus without complication ([place])    no meds currently- controlled with diet  . GERD (gastroesophageal reflux disease)   . GERD (gastroesophageal reflux disease) 12/07/2012  . Headache(784.0)    migraines  . Herpes genitalis in women 12/07/2012  . HIV infection ([place])   . Human immunodeficiency virus (HIV) disease ([place]) 12/07/2012  . Neuromuscular disorder ([place])    carpal tunnel in both arms  . SHINGLES 01/31/2009   Qualifier: Diagnosis of  By: Tomma Lightning MD, Claiborne Billings    . Type II or unspecified type diabetes mellitus without mention of complication, not  stated as uncontrolled 12/07/2012  . Unspecified asthma(493.90) 12/07/2012    Family History  Problem Relation Age of Onset  . Arthritis/Rheumatoid Mother   . Hypertension Mother   . Cancer Maternal Aunt        brand and lung  . Cancer Maternal Grandmother        breasts  . Cancer Paternal Grandmother        breast and kidney    Social History   Socioeconomic History  . Marital status: Single    Spouse name: Not on file  . Number of children: 1  . Years of education: 38  . Highest education level: Some college, no degree  Occupational History  . Occupation: Nutrition Social research officer, government: [employer]  . Financial resource strain: Not on file  . Food insecurity:    Worry: Not on file    Inability: Not on file  . Transportation needs:    Medical: Not on file    Non-medical: Not on file  Tobacco Use  . Smoking status: Never Smoker  . Smokeless tobacco: Never Used  Substance and Sexual Activity  . Alcohol use: No    Alcohol/week: 0.0 standard drinks    Comment: OCC.about once a month   . Drug use: No  . Sexual activity: Never    Partners: Male    Birth control/protection: Implant  Lifestyle  . Physical activity:    Days per week: Not on file  Minutes per session: Not on file  . Stress: Not on file  Relationships  . Social connections:    Talks on phone: Not on file    Gets together: Not on file    Attends religious service: Not on file    Active member of club or organization: Not on file    Attends meetings of clubs or organizations: Not on file    Relationship status: Not on file  . Intimate partner violence:    Fear of current or ex partner: Not on file    Emotionally abused: Not on file    Physically abused: Not on file    Forced sexual activity: Not on file  Other Topics Concern  . Not on file  Social History Narrative   Patient lives alone.   Patient works at home.   Patient has hs education.   Patient has 1 child.    Patient drinks multiple energy drinks and coffees throughout day, daily.      Patient is right-handed. She lives with her daughter in a one level home. She drinks one cup of coffee a day. She works for [employer] in [place] and is very active at work.    Past Surgical History:  Procedure Laterality Date  . CESAREAN SECTION    . COLON SURGERY     colon repair after salpingo  . INSERTION OF MESH N/A 11/06/2014   Procedure: INSERTION OF MESH;  Surgeon: Stark Klein, MD;  Location: [facility];  Service: General;  Laterality: N/A;  . LAPAROSCOPIC HEPATECTOMY N/A 06/28/2013   Procedure: LAPAROSCOPIC EXCISION HEPATIC MASS;  Surgeon: Stark Klein, MD;  Location: [facility];  Service: General;  Laterality: N/A;  . LAPAROSCOPIC LYSIS OF ADHESIONS N/A 12/04/2012   Procedure: LAPAROSCOPIC LYSIS OF ADHESIONS;  Surgeon: [name], MD;  Location: [facility];  Service: Gynecology;  Laterality: N/A;  . LAPAROTOMY N/A 12/04/2012   Procedure: EXPLORATORY LAPAROTOMY;  Surgeon: [name], MD;  Location: [facility];  Service: Gynecology;  Laterality: N/A;  repair of serosal injury  . ROBOTIC ASSISTED BILATERAL SALPINGO OOPHERECTOMY Right 12/04/2012   Procedure: ROBOTIC ASSISTED Right  SALPINGO OOPHORECTOMY;  Surgeon: [name], MD;  Location: [facility];  Service: Gynecology;  Laterality: Right;  . VENTRAL HERNIA REPAIR N/A 11/06/2014   Procedure: LAPAROSCOPIC ASSITED INCARCERATED VENTRAL HERNIA REPAIR WITH MESH;  Surgeon: Stark Klein, MD;  Location: [facility];  Service: General;  Laterality: N/A;    Immunization History  Administered Date(s) Administered  . Hepatitis A, Adult 08/03/2017  . Hepatitis B 03/20/2008, 09/25/2008, 06/03/2009  . Hepatitis B, adult 06/19/2014, 08/05/2014, 01/15/2015  . Influenza Split 08/02/2012  . Influenza Whole 06/13/2008, 06/03/2009, 06/01/2010  . Influenza,inj,Quad PF,6+ Mos 05/03/2013, 06/12/2014, 04/26/2016, 05/31/2017, 04/10/2018  . Meningococcal  Mcv4o 02/07/2017  . PPD Test 05/31/2014  . Pneumococcal Polysaccharide-23 03/20/2008, 05/03/2013  . Tdap 08/02/2012    Current Meds  Medication Sig  . albuterol (PROAIR HFA) 108 (90 Base) MCG/ACT inhaler INHALE TWO PUFFS INTO THE LUNGS EVERY 6 HOURS AS NEEDED FOR WHEEZING.  Marland Kitchen albuterol (PROVENTIL) (2.5 MG/3ML) 0.083% nebulizer solution Take 3 mLs (2.5 mg total) by nebulization every 6 (six) hours as needed for wheezing or shortness of breath (If no relief from MDI inhaler).  . beclomethasone (QVAR) 40 MCG/ACT inhaler INHALE TWO PUFFS INTO THE LUNGS TWO TIMES DAILY.  Marland Kitchen BIKTARVY 50-200-25 MG TABS tablet TAKE 1 TABLET BY MOUTH DAILY  . buPROPion (WELLBUTRIN XL) 300 MG 24 hr tablet Take 1  tablet (300 mg total) by mouth daily.  . cetirizine (ZYRTEC) 10 MG tablet Take 2 tablets (20 mg total) by mouth at bedtime.  . citalopram (CELEXA) 40 MG tablet Take 1 tablet (40 mg total) by mouth daily.  . DENTA 5000 PLUS 1.1 % CREA dental cream USE SMALL AMOUNT ON TEETH AND GUMS EVERY EVENING SPIT OUT EXCESS AND DO NOT RINSE  . lubiprostone (AMITIZA) 8 MCG capsule Take 8 mcg by mouth 2 (two) times daily with a meal.  . montelukast (SINGULAIR) 10 MG tablet TAKE 1 TABLET AT BEDTIME BY MOUTH.  . naproxen (EC NAPROSYN) 500 MG EC tablet Take 1 tablet (500 mg total) by mouth 2 (two) times daily with a meal.  . norethindrone-ethinyl estradiol (JUNEL FE,GILDESS FE,LOESTRIN FE) 1-20 MG-MCG tablet Take 1 tablet by mouth daily.  Marland Kitchen oxymetazoline (AFRIN) 0.05 % nasal spray Place 1 spray into both nostrils 2 (two) times daily. For <7 days at a time.  Marland Kitchen tiZANidine (ZANAFLEX) 4 MG capsule Take 1 capsule (4 mg total) by mouth 3 (three) times daily as needed for muscle spasms.  Marland Kitchen topiramate ER (QUDEXY XR) 50 MG CS24 sprinkle capsule Take 1 capsule (50 mg total) by mouth daily.  Marland Kitchen topiramate ER (QUDEXY XR) CS24 sprinkle capsule Take 1 capsule (100 mg total) by mouth at bedtime.  . traZODone (DESYREL) 100 MG tablet Take 1 tablet  (100 mg total) by mouth at bedtime. Take 1-2 tablets for sleep  . valACYclovir (VALTREX) 500 MG tablet Take 2 tablets (1,000 mg total) by mouth 2 (two) times daily for 7 days.    Allergies  Allergen Reactions  . Anette Guarneri [Lurasidone Hcl] Other (See Comments)    Occular dystonia  . Seroquel [Quetiapine Fumarate] Swelling    Tongue swells   . Benzoin Other (See Comments)    blisters  . Latex Rash and Other (See Comments)  . Sulfamethoxazole-Trimethoprim Itching and Nausea And Vomiting    BP 116/80 (BP Location: Right Arm, Patient Position: Sitting, Cuff Size: Large)   Pulse 84   Temp 97.8 F (36.6 C) (Oral)   Ht 5\' 4"  (1.626 m)   Wt 201 lb (91.2 kg)   LMP 05/21/2018   SpO2 100%   BMI 34.50 kg/m   Review of Systems  Constitutional: Positive for fatigue (increased).  Eyes: Negative.   Respiratory: Negative.   Cardiovascular: Negative.    Objective:   Physical Exam  Constitutional: She appears well-developed and well-nourished.  Cardiovascular: Normal rate, regular rhythm, normal heart sounds and intact distal pulses.  Pulmonary/Chest: Effort normal and breath sounds normal.  Abdominal: Soft. Bowel sounds are normal. She exhibits distension (Obese).  Nursing note and vitals reviewed.  Assessment & Plan:   1. Fatigue, unspecified type Probable r/t insomnia and reported blood loss with prolonged menstrual periods. She will begin taking Vitamin B-12 today.   2. Insomnia, unspecified type She will continue Trazodone as prescribed.   3. Screening for diabetes mellitus Hgb A1c is normal at 5.0 today. She will continue to decrease foods/beverages high in sugars and carbs and follow Heart Healthy or DASH diet. Increase physical activity to at least 30 minutes cardio exercise daily.  - POCT glycosylated hemoglobin (Hb A1C)  4. Follow up She will keep follow up appointment on 08/2017.  No orders of the defined types were placed in this encounter.   Kathe Becton,  MSN,  FNP-C [facility] [address] [place], [place] [address] [phone]

## 2018-06-08 ENCOUNTER — Encounter: Payer: Self-pay | Admitting: Family Medicine

## 2018-06-14 ENCOUNTER — Ambulatory Visit: Payer: Self-pay | Admitting: Family Medicine

## 2018-06-16 DIAGNOSIS — N921 Excessive and frequent menstruation with irregular cycle: Secondary | ICD-10-CM | POA: Diagnosis not present

## 2018-06-16 DIAGNOSIS — N938 Other specified abnormal uterine and vaginal bleeding: Secondary | ICD-10-CM | POA: Diagnosis not present

## 2018-06-20 ENCOUNTER — Other Ambulatory Visit: Payer: Self-pay

## 2018-06-27 ENCOUNTER — Other Ambulatory Visit: Payer: BLUE CROSS/BLUE SHIELD

## 2018-06-27 DIAGNOSIS — E785 Hyperlipidemia, unspecified: Secondary | ICD-10-CM | POA: Diagnosis not present

## 2018-06-27 DIAGNOSIS — B20 Human immunodeficiency virus [HIV] disease: Secondary | ICD-10-CM | POA: Diagnosis not present

## 2018-06-27 DIAGNOSIS — Z113 Encounter for screening for infections with a predominantly sexual mode of transmission: Secondary | ICD-10-CM | POA: Diagnosis not present

## 2018-06-28 DIAGNOSIS — N939 Abnormal uterine and vaginal bleeding, unspecified: Secondary | ICD-10-CM | POA: Diagnosis not present

## 2018-06-28 DIAGNOSIS — Z3200 Encounter for pregnancy test, result unknown: Secondary | ICD-10-CM | POA: Diagnosis not present

## 2018-06-28 DIAGNOSIS — N938 Other specified abnormal uterine and vaginal bleeding: Secondary | ICD-10-CM | POA: Diagnosis not present

## 2018-06-29 LAB — COMPREHENSIVE METABOLIC PANEL
AG Ratio: 1.5 (calc) (ref 1.0–2.5)
ALT: 17 U/L (ref 6–29)
AST: 17 U/L (ref 10–30)
Albumin: 3.8 g/dL (ref 3.6–5.1)
Alkaline phosphatase (APISO): 73 U/L (ref 33–115)
BUN: 17 mg/dL (ref 7–25)
CO2: 23 mmol/L (ref 20–32)
Calcium: 9.2 mg/dL (ref 8.6–10.2)
Chloride: 109 mmol/L (ref 98–110)
Creat: 1.09 mg/dL (ref 0.50–1.10)
Globulin: 2.5 g/dL (calc) (ref 1.9–3.7)
Glucose, Bld: 93 mg/dL (ref 65–99)
Potassium: 4.8 mmol/L (ref 3.5–5.3)
Sodium: 140 mmol/L (ref 135–146)
Total Bilirubin: 0.3 mg/dL (ref 0.2–1.2)
Total Protein: 6.3 g/dL (ref 6.1–8.1)

## 2018-06-29 LAB — CBC
HCT: 39.4 % (ref 35.0–45.0)
Hemoglobin: 13.6 g/dL (ref 11.7–15.5)
MCH: 32.5 pg (ref 27.0–33.0)
MCHC: 34.5 g/dL (ref 32.0–36.0)
MCV: 94.3 fL (ref 80.0–100.0)
MPV: 10.1 fL (ref 7.5–12.5)
Platelets: 306 10*3/uL (ref 140–400)
RBC: 4.18 10*6/uL (ref 3.80–5.10)
RDW: 12 % (ref 11.0–15.0)
WBC: 7.8 10*3/uL (ref 3.8–10.8)

## 2018-06-29 LAB — HIV-1 RNA QUANT-NO REFLEX-BLD
HIV 1 RNA QUANT: 43 {copies}/mL — AB
HIV-1 RNA Quant, Log: 1.63 Log copies/mL — ABNORMAL HIGH

## 2018-06-29 LAB — URINE CYTOLOGY ANCILLARY ONLY
CHLAMYDIA, DNA PROBE: NEGATIVE
NEISSERIA GONORRHEA: NEGATIVE

## 2018-06-29 LAB — LIPID PANEL
Cholesterol: 184 mg/dL (ref <200)
HDL: 44 mg/dL — ABNORMAL LOW (ref >50)
LDL Cholesterol (Calc): 114 mg/dL (calc) — ABNORMAL HIGH
NON-HDL CHOLESTEROL (CALC): 140 mg/dL — AB (ref <130)
Total CHOL/HDL Ratio: 4.2 (calc) (ref <5.0)
Triglycerides: 144 mg/dL (ref <150)

## 2018-06-29 LAB — RPR: RPR Ser Ql: NONREACTIVE

## 2018-06-29 LAB — T-HELPER CELL (CD4) - (RCID CLINIC ONLY)
CD4 % Helper T Cell: 35 % (ref 33–55)
CD4 T Cell Abs: 1160 /uL (ref 400–2700)

## 2018-07-04 ENCOUNTER — Ambulatory Visit (INDEPENDENT_AMBULATORY_CARE_PROVIDER_SITE_OTHER): Payer: Self-pay | Admitting: Infectious Diseases

## 2018-07-04 ENCOUNTER — Encounter: Payer: Self-pay | Admitting: Infectious Diseases

## 2018-07-04 VITALS — BP 114/75 | HR 81 | Temp 98.3°F | Wt 204.0 lb

## 2018-07-04 DIAGNOSIS — Z23 Encounter for immunization: Secondary | ICD-10-CM

## 2018-07-04 DIAGNOSIS — G44229 Chronic tension-type headache, not intractable: Secondary | ICD-10-CM

## 2018-07-04 DIAGNOSIS — R87612 Low grade squamous intraepithelial lesion on cytologic smear of cervix (LGSIL): Secondary | ICD-10-CM

## 2018-07-04 DIAGNOSIS — J302 Other seasonal allergic rhinitis: Secondary | ICD-10-CM | POA: Diagnosis not present

## 2018-07-04 DIAGNOSIS — Z113 Encounter for screening for infections with a predominantly sexual mode of transmission: Secondary | ICD-10-CM

## 2018-07-04 DIAGNOSIS — F3162 Bipolar disorder, current episode mixed, moderate: Secondary | ICD-10-CM

## 2018-07-04 DIAGNOSIS — B20 Human immunodeficiency virus [HIV] disease: Secondary | ICD-10-CM | POA: Diagnosis not present

## 2018-07-04 DIAGNOSIS — Z79899 Other long term (current) drug therapy: Secondary | ICD-10-CM

## 2018-07-04 NOTE — Assessment & Plan Note (Signed)
appreciate neuro f/u

## 2018-07-04 NOTE — Assessment & Plan Note (Signed)
She is doing well on biktarvy.  pcv today Has gotten flu vax.  Offered/refused condoms.  rtc in 86m onths.

## 2018-07-04 NOTE — Progress Notes (Signed)
   Subjective:    Patient ID: [name], female    DOB: February 20, 1981, 37 y.o.   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 Normal PAP 07-2017.  Has nexplanon, was seen in GYN 05-2018 for heavy bleeding. She had OCPs added (and bleeding has improved over last 3 days). She has switched GYN [name]). Had endometrial bx recently that was benign. Plan for pap by years end.   Has regular f/u at behavioral health --> psychiatry (PTSD, insomnia).  Has been having fatigue, wt gain. C/o numbness and tingling in her hands.  Afraid she will lose her health insurance in January due to plan change.   She is currently on biktarvy.    HIV 1 RNA Quant (copies/mL)  Date Value  06/27/2018 43 (H)  12/12/2017 <20 NOT DETECTED  04/19/2017 21 (H)   CD4 T Cell Abs (/uL)  Date Value  06/27/2018 1,160  12/12/2017 970  04/19/2017 1,130    Review of Systems  Constitutional: Positive for fatigue and unexpected weight change. Negative for appetite change.  HENT: Positive for dental problem and postnasal drip (unrelived with 2 zyrtec at hs and flonase).   Gastrointestinal: Negative for constipation and diarrhea.  Genitourinary: Positive for vaginal pain (cervical pain). Negative for difficulty urinating.  Neurological: Positive for headaches.  has neuro f/u for migraines.  Please see HPI. All other systems reviewed and negative.     Objective:   Physical Exam  Constitutional: She is oriented to person, place, and time. She appears well-developed and well-nourished.  HENT:  Mouth/Throat: No oropharyngeal exudate.  Eyes: Pupils are equal, round, and reactive to light. EOM are normal.  Neck: Normal range of motion. Neck supple.  Cardiovascular: Normal rate, regular rhythm and normal heart sounds.  Pulmonary/Chest: Effort normal and breath sounds normal.  Abdominal: Soft. Bowel sounds are normal. She exhibits no distension. There is no tenderness.    Musculoskeletal: Normal range of motion. She exhibits no edema.  Lymphadenopathy:    She has no cervical adenopathy.  Neurological: She is alert and oriented to person, place, and time.  Psychiatric: She has a normal mood and affect.      Assessment & Plan:

## 2018-07-04 NOTE — Assessment & Plan Note (Signed)
Appreciate GYN f/u.  She has repeat due by end of year.  explanon removal this week.

## 2018-07-04 NOTE — Assessment & Plan Note (Signed)
Cont zyrtec and flonase Consider eval by allergy (she is resistant)

## 2018-07-04 NOTE — Addendum Note (Signed)
Addended by: Aundria Rud on: 07/04/2018 03:16 PM   Modules accepted: Orders

## 2018-07-04 NOTE — Assessment & Plan Note (Signed)
She has f/u with psych.  She has moderate control of her disease, appreciate regular f/u.

## 2018-07-05 DIAGNOSIS — Z30430 Encounter for insertion of intrauterine contraceptive device: Secondary | ICD-10-CM | POA: Diagnosis not present

## 2018-07-05 DIAGNOSIS — Z3202 Encounter for pregnancy test, result negative: Secondary | ICD-10-CM | POA: Diagnosis not present

## 2018-07-05 DIAGNOSIS — Z3009 Encounter for other general counseling and advice on contraception: Secondary | ICD-10-CM | POA: Diagnosis not present

## 2018-07-05 DIAGNOSIS — Z3046 Encounter for surveillance of implantable subdermal contraceptive: Secondary | ICD-10-CM | POA: Diagnosis not present

## 2018-07-10 ENCOUNTER — Ambulatory Visit (INDEPENDENT_AMBULATORY_CARE_PROVIDER_SITE_OTHER): Payer: BLUE CROSS/BLUE SHIELD | Admitting: Pharmacist

## 2018-07-10 ENCOUNTER — Telehealth: Payer: Self-pay | Admitting: Pharmacist

## 2018-07-10 DIAGNOSIS — G629 Polyneuropathy, unspecified: Secondary | ICD-10-CM

## 2018-07-10 DIAGNOSIS — B20 Human immunodeficiency virus [HIV] disease: Secondary | ICD-10-CM

## 2018-07-10 MED ORDER — GABAPENTIN 300 MG PO CAPS: 300.0000 mg | ORAL_CAPSULE | Freq: Every day | ORAL | 5 refills | Status: DC

## 2018-07-10 NOTE — Telephone Encounter (Signed)
Received a call from [name] regarding severe numbness and tinging in her hands that she believes is related to her Biktarvy. This would be a very rare side effect, but she previously discussed this with a community pharmacist who seems to have found some evidence they may be related and told her that she should speak to her HIV doctor about changing medications. She has been experiencing this for over a year and has stopped all of her other medications, thinking they may have been the cause. She is seen by neurology for migraine headaches, but they have made no comment on her neuropathy. I do not think that it is related to her Biktarvy, but [name] is adamant given that nothing else has helped and the severity of her symptoms that she would like to try a different medicine. I made her an appointment with pharmacy today at 1:45pm to discuss her options.

## 2018-07-10 NOTE — Progress Notes (Signed)
HPI: [name] is a 37 y.o. 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  Patient Active Problem List   Diagnosis Date Noted  . Costochondritis, acute 01/02/2018  . Asthma exacerbation 08/30/2017  . Bipolar mixed affective disorder, moderate ([place]) 03/20/2017  . Chronic tension headaches 01/23/2017  . Hyperlipidemia 11/17/2015  . Sinusitis 06/30/2015  . Hernia, abdominal 08/05/2014  . 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 Lung abscess ([place]) 06/26/2013  . 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   05/07/2013  . Ovarian cystic mass 05/07/2013  . s/p Serosal repair of colon 12/04/2012 12/15/2012  . Herpes genitalis in women 12/07/2012  . Anxiety state, unspecified 12/07/2012  . GERD (gastroesophageal reflux disease) 12/07/2012  . Pelvic mass in female s/p Robotic-assisted right salpingo-oophorectomy 12/04/2012  . HEMORRHOIDS NOS, W/O COMPLICATIONS 93/81/8299  . Depression 11/25/2006  . Allergic rhinitis 11/25/2006  . 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 Human immunodeficiency virus (HIV) disease ([place]) 11/10/2006    Patient's Medications  New Prescriptions   GABAPENTIN (NEURONTIN) 300 MG CAPSULE    Take 1 capsule (300 mg total) by mouth at bedtime.  Previous Medications   ALBUTEROL (PROAIR HFA) 108 (90 BASE) MCG/ACT INHALER    INHALE TWO PUFFS INTO THE LUNGS EVERY 6 HOURS AS NEEDED FOR WHEEZING.   ALBUTEROL (PROVENTIL) (2.5 MG/3ML) 0.083% NEBULIZER SOLUTION    Take 3 mLs (2.5 mg total) by nebulization every 6 (six) hours as needed for wheezing or shortness of breath (If no relief from MDI inhaler).   BECLOMETHASONE (QVAR) 40 MCG/ACT INHALER    INHALE TWO PUFFS INTO THE LUNGS TWO TIMES DAILY.   BIKTARVY 50-200-25 MG TABS TABLET    TAKE 1 TABLET BY MOUTH DAILY   BUPROPION (WELLBUTRIN XL) 300 MG 24 HR TABLET    Take 1 tablet (300 mg total) by mouth daily.   CETIRIZINE (ZYRTEC) 10 MG TABLET    Take 2 tablets  (20 mg total) by mouth at bedtime.   CITALOPRAM (CELEXA) 40 MG TABLET    Take 1 tablet (40 mg total) by mouth daily.   DENTA 5000 PLUS 1.1 % CREA DENTAL CREAM    USE SMALL AMOUNT ON TEETH AND GUMS EVERY EVENING SPIT OUT EXCESS AND DO NOT RINSE   ELETRIPTAN (RELPAX) 40 MG TABLET    Take 1 tablet earliest onset of migraine.  May repeat x1in 2 hours if headache persists or recurs.  Do not exceed 2 tablets in 24h   LEVONORGESTREL (KYLEENA IU)    by Intrauterine route.   MELOXICAM (MOBIC) 15 MG TABLET    Take 1 tablet (15 mg total) by mouth daily.   MONTELUKAST (SINGULAIR) 10 MG TABLET    TAKE 1 TABLET AT BEDTIME BY MOUTH.   NAPROXEN (EC NAPROSYN) 500 MG EC TABLET    Take 1 tablet (500 mg total) by mouth 2 (two) times daily with a meal.   OXYMETAZOLINE (AFRIN) 0.05 % NASAL SPRAY    Place 1 spray into both nostrils 2 (two) times daily. For <7 days at a time.   TIZANIDINE (ZANAFLEX) 4 MG CAPSULE    Take 1 capsule (4 mg total) by mouth 3 (three) times daily as needed for muscle spasms.   TOPIRAMATE ER (QUDEXY XR) 50 MG CS24 SPRINKLE CAPSULE    Take 1 capsule (50 mg total) by mouth daily.   TOPIRAMATE ER (QUDEXY XR) CS24 SPRINKLE CAPSULE    Take 1 capsule (100 mg total) by mouth at bedtime.  TRAZODONE (DESYREL) 100 MG TABLET    Take 1 tablet (100 mg total) by mouth at bedtime. Take 1-2 tablets for sleep   TRIAMCINOLONE CREAM (KENALOG) 0.1 %    Apply 1 application topically 2 (two) times daily.   VALACYCLOVIR (VALTREX) 500 MG TABLET    Take 2 tablets (1,000 mg total) by mouth 2 (two) times daily for 7 days.  Modified Medications   No medications on file  Discontinued Medications   LUBIPROSTONE (AMITIZA) 8 MCG CAPSULE    Take 8 mcg by mouth 2 (two) times daily with a meal.   NORETHINDRONE-ETHINYL ESTRADIOL (JUNEL FE,GILDESS FE,LOESTRIN FE) 1-20 MG-MCG TABLET    Take 1 tablet by mouth daily.   RANITIDINE (ZANTAC) 150 MG TABLET    Take 1 tablet (150 mg total) by mouth 2 (two) times daily.   RIZATRIPTAN  (MAXALT-MLT) 5 MG DISINTEGRATING TABLET    Take 2 tablets (10 mg total) by mouth as needed for migraine. May repeat in 2 hours if needed   TRAMADOL (ULTRAM) 50 MG TABLET    Take 1 tablet (50 mg total) by mouth every 8 (eight) hours as needed.    Allergies: Allergies  Allergen Reactions  . Anette Guarneri [Lurasidone Hcl] Other (See Comments)    Occular dystonia  . Seroquel [Quetiapine Fumarate] Swelling    Tongue swells   . Benzoin Other (See Comments)    blisters  . Latex Rash and Other (See Comments)  . Sulfamethoxazole-Trimethoprim Itching and Nausea And Vomiting    Past Medical History: Past Medical History:  Diagnosis Date  . Allergy   . Anemia   . Anxiety   . Anxiety state, unspecified 12/07/2012  . Arthritis   . Asthma   . Bipolar disorder, unspecified ([place]) 12/07/2012  . Depression   . Diabetes mellitus without complication ([place])    no meds currently- controlled with diet  . GERD (gastroesophageal reflux disease)   . GERD (gastroesophageal reflux disease) 12/07/2012  . Headache(784.0)    migraines  . Herpes genitalis in women 12/07/2012  . HIV infection ([place])   . Human immunodeficiency virus (HIV) disease ([place]) 12/07/2012  . Neuromuscular disorder ([place])    carpal tunnel in both arms  . SHINGLES 01/31/2009   Qualifier: Diagnosis of  By: Tomma Lightning MD, Claiborne Billings    . Type II or unspecified type diabetes mellitus without mention of complication, not stated as uncontrolled 12/07/2012  . Unspecified asthma(493.90) 12/07/2012    Social History: Social History   Socioeconomic History  . Marital status: Single    Spouse name: Not on file  . Number of children: 1  . Years of education: 48  . Highest education level: Some college, no degree  Occupational History  . 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 Financial resource strain: Not on file  . Food insecurity:    Worry: Not on file    Inability: Not on file  . Transportation needs:     Medical: Not on file    Non-medical: Not on file  Tobacco Use  . Smoking status: Never Smoker  . Smokeless tobacco: Never Used  Substance and Sexual Activity  . Alcohol use: No    Alcohol/week: 0.0 standard drinks    Comment: OCC.about once a month   . Drug use: No  . Sexual activity: Never    Partners: Male    Birth control/protection: Implant  Lifestyle  . Physical activity:    Days per week: Not on  file    Minutes per session: Not on file  . Stress: Not on file  Relationships  . Social connections:    Talks on phone: Not on file    Gets together: Not on file    Attends religious service: Not on file    Active member of club or organization: Not on file    Attends meetings of clubs or organizations: Not on file    Relationship status: Not on file  Other Topics Concern  . Not on file  Social History Narrative   Patient lives alone.   Patient works at home.   Patient has hs education.   Patient has 1 child.   Patient drinks multiple energy drinks and coffees throughout day, daily.      Patient is right-handed. She lives with her daughter in a one level home. She drinks one cup of coffee a day. She works for [employer] in [place] and is very active at work.    Labs: Lab Results  Component Value Date   HIV1RNAQUANT 43 (H) 06/27/2018   HIV1RNAQUANT <20 NOT DETECTED 12/12/2017   HIV1RNAQUANT 21 (H) 04/19/2017   CD4TABS 1,160 06/27/2018   CD4TABS 970 12/12/2017   CD4TABS 1,130 04/19/2017    RPR and STI Lab Results  Component Value Date   LABRPR NON-REACTIVE 06/27/2018   LABRPR NON-REACTIVE 12/12/2017   LABRPR NON REAC 04/06/2017   LABRPR NON REAC 01/11/2017   LABRPR NON REAC 11/05/2015    STI Results GC GC CT CT  06/27/2018 Negative - Negative -  01/02/2018 Negative - Negative -  10/27/2017 Negative - Negative -  08/03/2017 Negative - Negative -  04/06/2017 Negative - Negative -  08/13/2016 Negative - Negative -  02/06/2016 Negative  - Negative -  11/05/2015 Negative - Negative -  06/06/2015 Negative - Negative -  12/26/2014 Negative - Negative -  02/12/2013 - NEGATIVE - NEGATIVE  06/21/2010 - - NEGATIVERTFDELIM(NOTE)  Testing performed using the BD ProbeTec Qx Chlamydia trachomatis and Neisseria gonorrhea amplified DNA assay.  Performed at:  [address], [place] [address]               [id number] -  07/12/2007 - - NEGATIVERTFDELIM(NOTE)  Testing performed using the BD Probetec ET Chlamydia trachomatis and Neisseria gonorrhea amplified DNA assay. -    Hepatitis B Lab Results  Component Value Date   HEPBSAB REACTIVE (A) 08/03/2017   HEPBSAG NEG 11/10/2006   HEPBCAB NEG 11/10/2006   Hepatitis C No results found for: HEPCAB, HCVRNAPCRQN Hepatitis A No results found for: HAV Lipids: Lab Results  Component Value Date   CHOL 184 06/27/2018   TRIG 144 06/27/2018   HDL 44 (L) 06/27/2018   CHOLHDL 4.2 06/27/2018   VLDL 25 04/06/2017   LDLCALC 114 (H) 06/27/2018   Cumulative HIV Genotype Data  Genotype Dates: 09/30/2008, 12/14/2011  RT Mutations K103N, V106M, Y115F, M184V, V106I  PI Mutations D60E, L63P  Integrase Mutations  None   Interpretation of Genotype Data per [facility] HIV Drug Resistance Database:  Nucleoside RTIs  Abacavir - High-Level Resistance Zidovudine - Susceptible Emtricitabine - High-Level Resistance Lamivudine - High-Level Resistance Tenofovir - Susceptible   Non-Nucleoside RTIs  Doravirine - Intermediate Resistance Efavirenz - High-Level Resistance Etravirine - Potential Low-Level Resistance  Nevirapine - High-Level Resistance Rilpivirine - Potential Low-Level Resistance   Protease Inhibitors  Atazanavir - Susceptible Darunavir - Susceptible Lopinavir - Susceptible   Integrase Inhibitors  Bictegravir - Susceptible Dolutegravir - Susceptible  Elvitegravir - Susceptible Raltegravir -  Susceptible    Current HIV Regimen: Biktarvy  Assessment: [name] is here today for evaluation of persistent symptoms of peripheral neuropathy. She says that she has had tingling in her fingers for over a year, which in the past 2-3 months has worsened with additional feelings of tightening and pain. She feels as though her fingers have "no circulation." She also experienced these symptoms in her feet for the first time today. She thought that this may be a side effect of one of her medications. She reviewed the possible side effects of all of her medications with her community pharmacist, who told her that this could be a rare side effect of Biktarvy and that she should speak to her HIV doctor about switching medications. Neither [name] nor I was able to find any evidence linking Biktarvy to new onset peripheral neuropathy. We explained to [name] that it is more likely to be related to HIV itself, rather than the ART. She was seen once by a neurologist for migraine headaches, but they have not evaluated her peripheral neuropathy.  [name]'s HIV has several mutations that make using an alternative regimen difficult, as well as many psychiatric medications that pose several drug interactions. I reviewed all of her current medications and updated her list. Aside from this neuropathy, which I doubt is related to [name], she is very happy with the medication and wants to continue it, if possible. We will start with low dose gabapentin to treat her peripheral neuropathy and adjust as needed. We explained that it will not work right away and may take several dose increases to reach a therapeutic effect. She will follow up with pharmacy again in ~6 weeks to evaluate its effect and make any further changes as needed.  Plan: Continue Biktarvy Start gabapentin 300mg  PO qHS F/u with pharmacy on 1/8 at 1:45pm  [name], PharmD PGY2 Infectious Diseases Pharmacy Resident Phone: [phone] 07/10/2018,  2:46 PM

## 2018-07-11 ENCOUNTER — Telehealth: Payer: Self-pay | Admitting: Pharmacist

## 2018-07-11 ENCOUNTER — Ambulatory Visit: Payer: Self-pay

## 2018-07-11 NOTE — Telephone Encounter (Signed)
Received a call from [place] regarding questions about her peripheral neuropathy. We saw her in clinic yesterday and prescribed low dose gabapentin. She says her symptoms have continued to worsen and she is wondering if there is any kind of test that can be done to definitively say this is peripheral neuropathy. I explained yesterday that the gabapentin may take several weeks and dose titrations to be maximally effective. I explained today that there is no "test" available for diagnosing peripheral neuropathy. It sounds like [name] has been looking things up online. She also has concerns about lactic acidosis caused by [facility] and is wondering if we can test her for that. I explained that lactic acidosis typically has a much more severe presentation with additional symptoms aside from neuropathy or joint pain and I do not think that she has it. I said that if it would make her feel comfortable, we could check a lactic acid level at her next follow up. She has an appointment with pharmacy on January 8.

## 2018-07-17 ENCOUNTER — Telehealth: Payer: Self-pay | Admitting: *Deleted

## 2018-07-17 NOTE — Telephone Encounter (Signed)
[facility] unable to reach patient for medication delivery, voicemail is full. RN confirmed demographics, attempted to call patient as well. Unable to leave message, will send mychart. Landis Gandy, RN

## 2018-07-27 ENCOUNTER — Telehealth: Payer: Self-pay

## 2018-07-27 NOTE — Telephone Encounter (Signed)
Patient walked into clinic today stating she is not feeling well. Chief complaint mainly sinus congestion, headache, cough, and runny nose. States she has been experiencing symptoms for two weeks, states they have worsen since Monday. Patient has tried over the counter medication Mucinex, peppermint oil, and [employer] brewer. Mentioned some symptom relief with over the counter medication. Advised patient to follow-up with PCP or Urgent care since we do not have any open appointments for this week. Also advised patient to try instacare or E[facility] to be seen faster.  Patient would like for me to route message to Dr. Johnnye Sima. Will call office if she is still experiencing symptoms. [place]

## 2018-07-28 ENCOUNTER — Ambulatory Visit: Payer: Self-pay | Admitting: Family Medicine

## 2018-07-28 NOTE — Telephone Encounter (Signed)
Thanks, I agree with your astute plan

## 2018-08-01 ENCOUNTER — Ambulatory Visit: Payer: BLUE CROSS/BLUE SHIELD | Admitting: Internal Medicine

## 2018-08-03 ENCOUNTER — Telehealth: Payer: Self-pay | Admitting: Behavioral Health

## 2018-08-03 NOTE — Telephone Encounter (Signed)
Patient called and wanted to speak to [facility].  Patient states she was started on Gabapentin in November.  She states she has noticed excessive weight gain and wanted to talk to [facility] about it.  She would like a call back after 1:30pm because she is at work. Pricilla Riffle RN

## 2018-08-14 ENCOUNTER — Telehealth: Payer: Self-pay | Admitting: Behavioral Health

## 2018-08-14 ENCOUNTER — Ambulatory Visit: Payer: Self-pay | Admitting: Obstetrics & Gynecology

## 2018-08-14 NOTE — Telephone Encounter (Signed)
Pharmacist Doug called from [facility] in [place] called stating he counseled patient over the phone today.  He states patient is complaining of fatigue after taking Biktarvy, increased caffeine, taking a multivitamin. Marden Noble states he wanted her Dr. To be aware.  Patient has an upcoming appointment with [facility] Pharmacist. Pricilla Riffle RN

## 2018-08-14 NOTE — Telephone Encounter (Signed)
thanks

## 2018-08-23 ENCOUNTER — Ambulatory Visit (INDEPENDENT_AMBULATORY_CARE_PROVIDER_SITE_OTHER): Payer: BLUE CROSS/BLUE SHIELD | Admitting: Pharmacist

## 2018-08-23 ENCOUNTER — Telehealth: Payer: Self-pay | Admitting: Pharmacist

## 2018-08-23 DIAGNOSIS — G629 Polyneuropathy, unspecified: Secondary | ICD-10-CM | POA: Diagnosis not present

## 2018-08-23 DIAGNOSIS — B20 Human immunodeficiency virus [HIV] disease: Secondary | ICD-10-CM | POA: Diagnosis not present

## 2018-08-23 DIAGNOSIS — E881 Lipodystrophy, not elsewhere classified: Secondary | ICD-10-CM

## 2018-08-23 NOTE — Telephone Encounter (Signed)
I saw patient in clinic today and she wanted me to ask Dr. Johnnye Sima about starting on [place] for the fat redistribution on her stomach. I told her I would ask Dr. Johnnye Sima and if he was ok with it, I would send in and help her get co-pay assistance. Will route to Dr. Johnnye Sima.

## 2018-08-23 NOTE — Progress Notes (Signed)
HPI: [name] is a 38 y.o. female who presents to the [place] [facility] for HIV and peripheral neuropathy follow-up.  Patient Active Problem List   Diagnosis Date Noted  . Costochondritis, acute 01/02/2018  . Asthma exacerbation 08/30/2017  . Bipolar mixed affective disorder, moderate ([place]) 03/20/2017  . Chronic tension headaches 01/23/2017  . Hyperlipidemia 11/17/2015  . Sinusitis 06/30/2015  . Hernia, abdominal 08/05/2014  . 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 Lung abscess ([place]) 06/26/2013  . 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   05/07/2013  . Ovarian cystic mass 05/07/2013  . s/p Serosal repair of colon 12/04/2012 12/15/2012  . Herpes genitalis in women 12/07/2012  . Anxiety state, unspecified 12/07/2012  . GERD (gastroesophageal reflux disease) 12/07/2012  . Pelvic mass in female s/p Robotic-assisted right salpingo-oophorectomy 12/04/2012  . HEMORRHOIDS NOS, W/O COMPLICATIONS 54/00/8676  . Depression 11/25/2006  . Allergic rhinitis 11/25/2006  . 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 Human immunodeficiency virus (HIV) disease ([place]) 11/10/2006    Patient's Medications  New Prescriptions   No medications on file  Previous Medications   ALBUTEROL (PROAIR HFA) 108 (90 BASE) MCG/ACT INHALER    INHALE TWO PUFFS INTO THE LUNGS EVERY 6 HOURS AS NEEDED FOR WHEEZING.   ALBUTEROL (PROVENTIL) (2.5 MG/3ML) 0.083% NEBULIZER SOLUTION    Take 3 mLs (2.5 mg total) by nebulization every 6 (six) hours as needed for wheezing or shortness of breath (If no relief from MDI inhaler).   BECLOMETHASONE (QVAR) 40 MCG/ACT INHALER    INHALE TWO PUFFS INTO THE LUNGS TWO TIMES DAILY.   BIKTARVY 50-200-25 MG TABS TABLET    TAKE 1 TABLET BY MOUTH DAILY   BUPROPION (WELLBUTRIN XL) 300 MG 24 HR TABLET    Take 1 tablet (300 mg total) by mouth daily.   CETIRIZINE (ZYRTEC) 10 MG TABLET    Take 2 tablets (20 mg total) by mouth at bedtime.   CITALOPRAM (CELEXA) 40 MG TABLET    Take 1 tablet (40 mg total) by mouth daily.   DENTA 5000 PLUS 1.1 % CREA DENTAL CREAM    USE SMALL AMOUNT ON TEETH AND GUMS EVERY EVENING SPIT OUT EXCESS AND DO NOT RINSE   ELETRIPTAN (RELPAX) 40 MG TABLET    Take 1 tablet earliest onset of migraine.  May repeat x1in 2 hours if headache persists or recurs.  Do not exceed 2 tablets in 24h   GABAPENTIN (NEURONTIN) 300 MG CAPSULE    Take 1 capsule (300 mg total) by mouth at bedtime.   LEVONORGESTREL (KYLEENA IU)    by Intrauterine route.   MELOXICAM (MOBIC) 15 MG TABLET    Take 1 tablet (15 mg total) by mouth daily.   MONTELUKAST (SINGULAIR) 10 MG TABLET    TAKE 1 TABLET AT BEDTIME BY MOUTH.   NAPROXEN (EC NAPROSYN) 500 MG EC TABLET    Take 1 tablet (500 mg total) by mouth 2 (two) times daily with a meal.   OXYMETAZOLINE (AFRIN) 0.05 % NASAL SPRAY    Place 1 spray into both nostrils 2 (two) times daily. For <7 days at a time.   TIZANIDINE (ZANAFLEX) 4 MG CAPSULE    Take 1 capsule (4 mg total) by mouth 3 (three) times daily as needed for muscle spasms.   TOPIRAMATE ER (QUDEXY XR) 50 MG CS24 SPRINKLE CAPSULE    Take 1 capsule (50 mg total) by mouth daily.   TOPIRAMATE ER (QUDEXY XR) CS24 SPRINKLE CAPSULE  Take 1 capsule (100 mg total) by mouth at bedtime.   TRAZODONE (DESYREL) 100 MG TABLET    Take 1 tablet (100 mg total) by mouth at bedtime. Take 1-2 tablets for sleep   TRIAMCINOLONE CREAM (KENALOG) 0.1 %    Apply 1 application topically 2 (two) times daily.   VALACYCLOVIR (VALTREX) 500 MG TABLET    Take 2 tablets (1,000 mg total) by mouth 2 (two) times daily for 7 days.  Modified Medications   No medications on file  Discontinued Medications   No medications on file    Allergies: Allergies  Allergen Reactions  . Anette Guarneri [Lurasidone Hcl] Other (See Comments)    Occular dystonia  . Seroquel [Quetiapine Fumarate] Swelling    Tongue swells   . Benzoin Other (See Comments)    blisters  . Latex Rash and Other  (See Comments)  . Sulfamethoxazole-Trimethoprim Itching and Nausea And Vomiting    Past Medical History: Past Medical History:  Diagnosis Date  . Allergy   . Anemia   . Anxiety   . Anxiety state, unspecified 12/07/2012  . Arthritis   . Asthma   . Bipolar disorder, unspecified ([place]) 12/07/2012  . Depression   . Diabetes mellitus without complication ([place])    no meds currently- controlled with diet  . GERD (gastroesophageal reflux disease)   . GERD (gastroesophageal reflux disease) 12/07/2012  . Headache(784.0)    migraines  . Herpes genitalis in women 12/07/2012  . HIV infection ([place])   . Human immunodeficiency virus (HIV) disease ([place]) 12/07/2012  . Neuromuscular disorder ([place])    carpal tunnel in both arms  . SHINGLES 01/31/2009   Qualifier: Diagnosis of  By: Tomma Lightning MD, Claiborne Billings    . Type II or unspecified type diabetes mellitus without mention of complication, not stated as uncontrolled 12/07/2012  . Unspecified asthma(493.90) 12/07/2012    Social History: Social History   Socioeconomic History  . Marital status: Single    Spouse name: Not on file  . Number of children: 1  . Years of education: 79  . Highest education level: Some college, no degree  Occupational History  . Occupation: Nutrition Social research officer, government: [employer]  . Financial resource strain: Not on file  . Food insecurity:    Worry: Not on file    Inability: Not on file  . Transportation needs:    Medical: Not on file    Non-medical: Not on file  Tobacco Use  . Smoking status: Never Smoker  . Smokeless tobacco: Never Used  Substance and Sexual Activity  . Alcohol use: No    Alcohol/week: 0.0 standard drinks    Comment: OCC.about once a month   . Drug use: No  . Sexual activity: Never    Partners: Male    Birth control/protection: Implant  Lifestyle  . Physical activity:    Days per week: Not on file    Minutes per session: Not on file  . Stress: Not on file    Relationships  . Social connections:    Talks on phone: Not on file    Gets together: Not on file    Attends religious service: Not on file    Active member of club or organization: Not on file    Attends meetings of clubs or organizations: Not on file    Relationship status: Not on file  Other Topics Concern  . Not on file  Social History Narrative   Patient lives alone.  Patient works at home.   Patient has hs education.   Patient has 1 child.   Patient drinks multiple energy drinks and coffees throughout day, daily.      Patient is right-handed. She lives with her daughter in a one level home. She drinks one cup of coffee a day. She works for [employer] in [place] and is very active at work.    Labs: Lab Results  Component Value Date   HIV1RNAQUANT 43 (H) 06/27/2018   HIV1RNAQUANT <20 NOT DETECTED 12/12/2017   HIV1RNAQUANT 21 (H) 04/19/2017   CD4TABS 1,160 06/27/2018   CD4TABS 970 12/12/2017   CD4TABS 1,130 04/19/2017    RPR and STI Lab Results  Component Value Date   LABRPR NON-REACTIVE 06/27/2018   LABRPR NON-REACTIVE 12/12/2017   LABRPR NON REAC 04/06/2017   LABRPR NON REAC 01/11/2017   LABRPR NON REAC 11/05/2015    STI Results GC GC CT CT  06/27/2018 Negative - Negative -  01/02/2018 Negative - Negative -  10/27/2017 Negative - Negative -  08/03/2017 Negative - Negative -  04/06/2017 Negative - Negative -  08/13/2016 Negative - Negative -  02/06/2016 Negative - Negative -  11/05/2015 Negative - Negative -  06/06/2015 Negative - Negative -  12/26/2014 Negative - Negative -  02/12/2013 - NEGATIVE - NEGATIVE  06/21/2010 - - NEGATIVERTFDELIM(NOTE)  Testing performed using the BD ProbeTec Qx Chlamydia trachomatis and Neisseria gonorrhea amplified DNA assay.  Performed at:  [facility] [place], [place] [address]               [id number] -   07/12/2007 - - NEGATIVERTFDELIM(NOTE)  Testing performed using the BD Probetec ET Chlamydia trachomatis and Neisseria gonorrhea amplified DNA assay. -    Hepatitis B Lab Results  Component Value Date   HEPBSAB REACTIVE (A) 08/03/2017   HEPBSAG NEG 11/10/2006   HEPBCAB NEG 11/10/2006   Hepatitis C No results found for: HEPCAB, HCVRNAPCRQN Hepatitis A No results found for: HAV Lipids: Lab Results  Component Value Date   CHOL 184 06/27/2018   TRIG 144 06/27/2018   HDL 44 (L) 06/27/2018   CHOLHDL 4.2 06/27/2018   VLDL 25 04/06/2017   LDLCALC 114 (H) 06/27/2018    Current HIV Regimen: Biktarvy  Assessment: [name] is here today to follow-up for her HIV infection and peripheral neuropathy.  She was started on low-dose gabapentin 300 mg daily at bedtime about 6 weeks ago for peripheral neuropathy.  She has been taking the gabapentin reliably but is noticing some weight gain, which is a common side effect with the medication.  She has still been feeling fatigued and having joint and muscle pain.  She states the tingling in her hands and feet are a little better but still bothering her.   She states that her psychologist finally figured out why she has felt so bad lately. They checked her vitamin D level and it was <10 which indicates a severe deficiency. She was started on vitamin d supplementation. I will not increase her gabapentin today since she is having side effects.  We will wait to see if she starts feeling better after taking the vitamin d for awhile.  She has a f/u appointment with neurology in February. I told her to call us  if she needed to be seen before her appointment with Dr. Johnnye Sima next fall and we could get her in with one of our nurse practitioners. She agrees with this plan.  She is also asking to go on Egrifta for fat redistribution and lipodystrophy. I explained what Egrifta is and how to inject the medication into her abdomen. She really wants to try it.  Will ask  Dr. Johnnye Sima and send it in to her pharmacy if he allows it. Will help her get co-pay assistance if needed.  She is still taking Biktarvy with no issues.  Plan: - Continue Biktarvy PO once daily - Continue gabapentin 300 mg PO qhs for now - F/u with Dr. Johnnye Sima 9/1 at 145pm  Cassie L. Kuppelweiser, PharmD, BCIDP, AAHIVP, [facility] 08/24/2018, 9:58 AM

## 2018-08-25 NOTE — Telephone Encounter (Signed)
i'm ok with it as long as she knows that it will likely recur after she stops

## 2018-08-31 MED ORDER — TESAMORELIN ACETATE 2 MG ~~LOC~~ SOLR: 2.0000 mg | Freq: Every day | SUBCUTANEOUS | 2 refills | Status: DC

## 2018-08-31 NOTE — Addendum Note (Signed)
Addended by: Darletta Moll on: 08/31/2018 04:57 PM   Modules accepted: Orders

## 2018-09-05 ENCOUNTER — Telehealth: Payer: Self-pay

## 2018-09-05 MED ORDER — TESAMORELIN ACETATE 1 MG ~~LOC~~ SOLR: 2.0000 mg | Freq: Every day | SUBCUTANEOUS | 0 refills | Status: DC

## 2018-09-05 NOTE — Telephone Encounter (Signed)
Received call from [facility] that patient must be enrolled in [place] patient program before this prescription will be able to be processed. [facility] will fax form. Landis Gandy, RN

## 2018-09-05 NOTE — Telephone Encounter (Signed)
Ok no problem. Can you give form to [name] once you receive? [name] - I can help if needed. Thanks!

## 2018-09-05 NOTE — Telephone Encounter (Signed)
Patient walked into clinic with complaints of using nebulizer twice last night without relief from chest congestion.  She has complaint of chest congestion with wheezing and shortness of breath.  She was advised to call primary care office urgently for appointment.   She was advised to go to local urgent care since she will more than likely need a chest x-ray. Her insurance recently changed and she is no longer able to attend family practice. Advised to call the toll free number on her insurance card for assistance today. She was also advised if symptoms continue to seek care at local emergency room.   Laverle Patter, RN

## 2018-09-05 NOTE — Telephone Encounter (Signed)
[facility] is calling for dose change on tesamorelin acetate 2mg s.  This script will need to be changed to tesamorelin 1 mg with inject 2 mgs daily.  Quantity will need to be changed to #60.   Verbal authorization given for changes.  New order added to medication list.    Laverle Patter, RN

## 2018-09-06 ENCOUNTER — Other Ambulatory Visit: Payer: Self-pay | Admitting: Behavioral Health

## 2018-09-06 DIAGNOSIS — B009 Herpesviral infection, unspecified: Secondary | ICD-10-CM

## 2018-09-06 DIAGNOSIS — B20 Human immunodeficiency virus [HIV] disease: Secondary | ICD-10-CM

## 2018-09-06 MED ORDER — VALACYCLOVIR HCL 500 MG PO TABS: 1000.0000 mg | ORAL_TABLET | Freq: Two times a day (BID) | ORAL | 0 refills | Status: DC

## 2018-09-08 ENCOUNTER — Ambulatory Visit: Payer: Self-pay | Admitting: Family Medicine

## 2018-09-11 ENCOUNTER — Telehealth: Payer: Self-pay | Admitting: Pharmacist

## 2018-09-11 NOTE — Telephone Encounter (Signed)
Filled out necessary documentation for patient to get Egrifta and faxed in to specialty pharmacy and insurance.

## 2018-09-18 NOTE — Telephone Encounter (Signed)
Egrifta was denied by patient's insurance because patient has a diagnosis of diabetes on her profile. Sent in appeal letter to insurance explaining that patient is not on any medications for diabetes and her A1c and fasting glucose numbers have been great for several years. Will update once determination is received.

## 2018-09-20 NOTE — Telephone Encounter (Signed)
Appeal for Egrifta was approved and patient is able to get medication through 09/09/2021.  Called [facility] to update.

## 2018-09-21 ENCOUNTER — Other Ambulatory Visit (HOSPITAL_COMMUNITY)
Admission: RE | Admit: 2018-09-21 | Discharge: 2018-09-21 | Disposition: A | Discharge status: Home / Self Care | Payer: BLUE CROSS/BLUE SHIELD | Source: Ambulatory Visit | Attending: Infectious Diseases | Admitting: Infectious Diseases

## 2018-09-21 ENCOUNTER — Ambulatory Visit (INDEPENDENT_AMBULATORY_CARE_PROVIDER_SITE_OTHER): Payer: BLUE CROSS/BLUE SHIELD | Admitting: Infectious Diseases

## 2018-09-21 DIAGNOSIS — B20 Human immunodeficiency virus [HIV] disease: Secondary | ICD-10-CM

## 2018-09-21 DIAGNOSIS — F3162 Bipolar disorder, current episode mixed, moderate: Secondary | ICD-10-CM | POA: Diagnosis not present

## 2018-09-21 DIAGNOSIS — R87612 Low grade squamous intraepithelial lesion on cytologic smear of cervix (LGSIL): Secondary | ICD-10-CM | POA: Diagnosis not present

## 2018-09-21 DIAGNOSIS — Z124 Encounter for screening for malignant neoplasm of cervix: Secondary | ICD-10-CM | POA: Insufficient documentation

## 2018-09-21 DIAGNOSIS — J45991 Cough variant asthma: Secondary | ICD-10-CM

## 2018-09-21 DIAGNOSIS — N898 Other specified noninflammatory disorders of vagina: Secondary | ICD-10-CM

## 2018-09-21 DIAGNOSIS — A6009 Herpesviral infection of other urogenital tract: Secondary | ICD-10-CM

## 2018-09-21 DIAGNOSIS — B009 Herpesviral infection, unspecified: Secondary | ICD-10-CM

## 2018-09-21 MED ORDER — BECLOMETHASONE DIPROPIONATE 40 MCG/ACT IN AERS: INHALATION_SPRAY | RESPIRATORY_TRACT | 3 refills | Status: DC

## 2018-09-21 MED ORDER — METRONIDAZOLE 0.75 % EX GEL: 1.0000 "application " | Freq: Two times a day (BID) | CUTANEOUS | 1 refills | Status: AC

## 2018-09-21 MED ORDER — VALACYCLOVIR HCL 1 G PO TABS: 1000.0000 mg | ORAL_TABLET | Freq: Every day | ORAL | 2 refills | Status: DC

## 2018-09-21 MED ORDER — MONTELUKAST SODIUM 10 MG PO TABS: ORAL_TABLET | ORAL | 1 refills | Status: DC

## 2018-09-21 NOTE — Assessment & Plan Note (Signed)
She appears stable, on multiple meds.

## 2018-09-21 NOTE — Assessment & Plan Note (Signed)
She appears to be doing well rechcek HIV VL today.  Given condoms, has (-) partner. Suggested PREP.  rtc in 6 months.

## 2018-09-21 NOTE — Assessment & Plan Note (Signed)
Refilled qvar, singulair.  URI sx improving.

## 2018-09-21 NOTE — Assessment & Plan Note (Signed)
Will give her daily valtrex.

## 2018-09-21 NOTE — Progress Notes (Signed)
      Subjective:    [name] is a 38 y.o. female here for an annual pelvic exam and pap smear.   Review of Systems: Current GYN complaints or concerns: got a new IUD. Feels like she has BV again.   Patient denies any abdominal/pelvic pain, problems with bowel movements, urination, vaginal discharge or intercourse.   Past Medical History:  Diagnosis Date  . Allergy   . Anemia   . Anxiety   . Anxiety state, unspecified 12/07/2012  . Arthritis   . Asthma   . Bipolar disorder, unspecified ([place]) 12/07/2012  . Depression   . Diabetes mellitus without complication ([place])    no meds currently- controlled with diet  . GERD (gastroesophageal reflux disease)   . GERD (gastroesophageal reflux disease) 12/07/2012  . Headache(784.0)    migraines  . Herpes genitalis in women 12/07/2012  . HIV infection ([place])   . Human immunodeficiency virus (HIV) disease ([place]) 12/07/2012  . Neuromuscular disorder ([place])    carpal tunnel in both arms  . SHINGLES 01/31/2009   Qualifier: Diagnosis of  By: Tomma Lightning MD, Claiborne Billings    . Type II or unspecified type diabetes mellitus without mention of complication, not stated as uncontrolled 12/07/2012  . Unspecified asthma(493.90) 12/07/2012    Gynecologic History: [id number]  No LMP recorded. Patient has had an implant. Contraception: IUD Last Pap: 07/2017. Results were: normal *h/o LEEP and high grade dysplasia 2017 Anal Intercourse: no Last Mammogram: not indicated. Results were:   Objective:  Physical Exam  Constitutional: Well developed, well nourished, no acute distress. She is alert and oriented x3.  Pelvic: External genitalia is normal in appearance. The vagina is normal in appearance. The cervix is bulbous and easily visualized. No CMT, thin white discharge present. IUD strings visualized. Bimanual exam reveals uterus that is felt to be normal size, shape, and contour. No adnexal masses or tenderness noted. Breasts: symmetrical in contour, shape and  texture. No palpable masses/nodules. No nipple discharge.  Psych: She has a normal mood and affect.    Assessment:  Normal pelvic and bimanual exam Normal clinical breast exam Suspicious for bacterial vaginosis vs STI  Plan:  History of Cervical Dysplasia s/p LEEP =   Cytology +HPV, GC/C, BV, trichomoniasis.   Return in 1 year for annual pap screening unless indicated sooner.   Discussed recommended screening interval for women living with HIV disease. Will continue annual screenings for now with consideration to increase interval to q46yr pending further discussions  Results will be communicated to the patient via MyChart   Vaginal Discharge =   Likely BV. She would like to try cream as the pills are nauseating  Metronidazole 3.23% 1 application to vagina x 5d. If she cannot tolerate will give oral regimen   Health Maintenance =   She has been counseled and instructed how to perform monthly self breast exams.  Screening mammogram to be scheduled.   Contraception / Family Planning =   Kylena - strings visualized; placed Nov-2019  HIV =   She will continue her Biktarvy and F/U as scheduled with Dr. Johnnye Sima for ongoing HIV care.   Janene Madeira, MSN, NP-C [facility] [place] Office: [phone] Pager: [phone]  09/21/18 3:21 PM

## 2018-09-21 NOTE — Assessment & Plan Note (Signed)
For PAP today, my appreciation to Ms Doren Custard NP

## 2018-09-21 NOTE — Progress Notes (Signed)
   Subjective:    Patient ID: [name], female    DOB: 1980-12-19, 38 y.o.   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 Normal PAP 07-2017.  Has nexplanon, was seen in GYN 05-2018 for heavy bleeding. She had OCPs added (and bleeding has improved over last 3 days). She has switched GYN [name]). Had endometrial bx prev that was benign.  Has regular f/u at behavioral health --> psychiatry (PTSD, insomnia).   meds reviewed Was seen for "flu" earlier this week. was given tussionex, prednisone, tamiflu.  Continues to have occas HSV outbreaks, asks about suppressive Valtrex.  Has repeat PAP scheduled for today.   HIV 1 RNA Quant (copies/mL)  Date Value  06/27/2018 43 (H)  12/12/2017 <20 NOT DETECTED  04/19/2017 21 (H)   CD4 T Cell Abs (/uL)  Date Value  06/27/2018 1,160  12/12/2017 970  04/19/2017 1,130    Review of Systems  Constitutional: Negative for appetite change and unexpected weight change.  HENT: Positive for postnasal drip.   Respiratory: Positive for cough. Negative for shortness of breath.   Gastrointestinal: Negative for constipation and diarrhea.  Genitourinary: Negative for difficulty urinating and menstrual problem.  prev fever, improved.  has boyfriend, he is (-).  Please see HPI. All other systems reviewed and negative.      Objective:   Physical Exam Constitutional:      Appearance: Normal appearance. She is obese.  HENT:     Nose: No congestion.     Mouth/Throat:     Mouth: Mucous membranes are moist.     Pharynx: Oropharynx is clear. No oropharyngeal exudate.  Eyes:     Extraocular Movements: Extraocular movements intact.     Pupils: Pupils are equal, round, and reactive to light.  Neck:     Musculoskeletal: Normal range of motion and neck supple.  Cardiovascular:     Rate and Rhythm: Normal rate and regular rhythm.  Pulmonary:     Effort: Pulmonary effort is normal.     Breath sounds: Normal breath  sounds.  Abdominal:     General: Bowel sounds are normal. There is no distension.     Palpations: Abdomen is soft.     Tenderness: There is no abdominal tenderness.  Musculoskeletal:     Right lower leg: No edema.     Left lower leg: No edema.  Neurological:     Mental Status: She is alert.  Psychiatric:        Mood and Affect: Mood normal.           Assessment & Plan:

## 2018-09-22 ENCOUNTER — Encounter: Payer: Self-pay | Admitting: Infectious Diseases

## 2018-09-23 LAB — HIV-1 RNA QUANT-NO REFLEX-BLD
HIV 1 RNA QUANT: NOT DETECTED {copies}/mL
HIV-1 RNA QUANT, LOG: NOT DETECTED {Log_copies}/mL

## 2018-09-25 LAB — CYTOLOGY - PAP
Bacterial vaginitis: NEGATIVE
Chlamydia: NEGATIVE
Diagnosis: NEGATIVE
HPV: NOT DETECTED
Neisseria Gonorrhea: NEGATIVE

## 2018-09-25 NOTE — Progress Notes (Signed)
Normal pap cytology. Repeat in 1 year. MyChart

## 2018-10-04 NOTE — Progress Notes (Addendum)
NEUROLOGY FOLLOW UP OFFICE NOTE  [name] [id number]  HISTORY OF PRESENT ILLNESS: [name] is a 38 year old African-American woman with depression, PTSD, HIV positive and asthma who follows up for migraine.  UPDATE: She was started on topiramate ER but her insurance didn't approve it. Intensity:  Moderate to severe Duration:  40-60 minutes severe, but lasts all day Frequency:  daily Frequency of abortive medication: takes Excedrin at least 4 days a week Current NSAIDS:  none Current analgesics:  Excedrin Migraine Extra-strength Current triptans:   Relpax 40 mg (no longer taking.  It makes her feel sick and lightheaded. It helps ease the migraine but not aborts it) Current ergotamine:  none Current anti-emetic:  none Current muscle relaxants:  Tizanidine 4mg  Current anti-anxiolytic:  none Current sleep aide:  trazodone Current Antihypertensive medications:  none Current Antidepressant medications:  Citalopram 40mg , Wellbutrin XL 300mg  Current Anticonvulsant medications:   none Current anti-CGRP:  none Current Vitamins/Herbal/Supplements:  none Current Antihistamines/Decongestants:  none Other therapy:  none  Caffeine:  1 cup of coffee daily Alcohol:  none Smoker:  none Diet:  5-6 bottles water daily Exercise:  She tries to Depression:  yes; Anxiety:  yes Other pain:  anxiety Sleep hygiene:  Poor.  Sleeps 3 to 5 hours a night.    She feels fatigue during the day.  She had a sleep study that showed "excessive snoring" which wakes her up but not sleep apnea.  HISTORY:  Onset: 38 years old Location:  Varies:  Sometimes right-sided;  Bilateral behind eyes; bilateral back of head Quality:  squeezing Initial intensity:  Moderate to severe.  She denies new headache, thunderclap headache or severe headache that wakes her from sleep. Aura:  Sometimes sees colors with squiggly lines (sometimes without headahce) Prodrome:  no Postdrome:  no Associated symptoms:  Nausea (starts before headache), sometimes vomit, photophobia, phonophobia, osmophobia, blurred vision.  She denies associated unilateral numbness or weakness. Initial duration:  40-60 minutes severe, but can last all day Initial Frequency:  daily Initial Frequency of abortive medication: Excedrin Migraine daily for headache Triggers: None Relieving factors: Peppermint oil, quiet room Activity:  aggravates  Past NSAIDS:  Ibuprofen 800mg  Past analgesics:  Fioricet, tramadol Past abortive triptans:  Sumatriptan 100mg , Maxalt MLT 10mg , Relpax 40mg  Past abortive ergotamine:  none Past muscle relaxants:  none Past anti-emetic:  none Past antihypertensive medications:  Maybe Inderal Past antidepressant medications:  Fluoxetine, amitriptyline Past anticonvulsant medications:  Lamotrigine 50mg , topiramate ER 100mg  at bedtime Past anti-CGRP:  none Past vitamins/Herbal/Supplements:  none Past antihistamines/decongestants:  none Other past therapies:  none    Family history of headache:  Mom, maternal aunt (passed away due to brain cancer)  PAST MEDICAL HISTORY: Past Medical History:  Diagnosis Date  . Allergy   . Anemia   . Anxiety   . Anxiety state, unspecified 12/07/2012  . Arthritis   . Asthma   . Bipolar disorder, unspecified ([place]) 12/07/2012  . Depression   . Diabetes mellitus without complication ([place])    no meds currently- controlled with diet  . GERD (gastroesophageal reflux disease)   . GERD (gastroesophageal reflux disease) 12/07/2012  . Headache(784.0)    migraines  . Herpes genitalis in women 12/07/2012  . HIV infection ([place])   . Human immunodeficiency virus (HIV) disease ([place]) 12/07/2012  . Neuromuscular disorder ([place])    carpal tunnel in both arms  . SHINGLES 01/31/2009   Qualifier: Diagnosis of  By: Tomma Lightning MD, Claiborne Billings    . Type II  or unspecified type diabetes mellitus without mention of complication, not stated as uncontrolled 12/07/2012  . Unspecified asthma(493.90)  12/07/2012    MEDICATIONS: Current Outpatient Medications on File Prior to Visit  Medication Sig Dispense Refill  . albuterol (PROAIR HFA) 108 (90 Base) MCG/ACT inhaler INHALE TWO PUFFS INTO THE LUNGS EVERY 6 HOURS AS NEEDED FOR WHEEZING. 8.5 Inhaler 3  . albuterol (PROVENTIL) (2.5 MG/3ML) 0.083% nebulizer solution Take 3 mLs (2.5 mg total) by nebulization every 6 (six) hours as needed for wheezing or shortness of breath (If no relief from MDI inhaler). 75 mL 3  . beclomethasone (QVAR) 40 MCG/ACT inhaler INHALE TWO PUFFS INTO THE LUNGS TWO TIMES DAILY. 8.7 Inhaler 3  . BIKTARVY 50-200-25 MG TABS tablet TAKE 1 TABLET BY MOUTH DAILY 30 tablet 0  . buPROPion (WELLBUTRIN XL) 300 MG 24 hr tablet Take 1 tablet (300 mg total) by mouth daily. 90 tablet 0  . cetirizine (ZYRTEC) 10 MG tablet Take 2 tablets (20 mg total) by mouth at bedtime. 60 tablet 0  . citalopram (CELEXA) 40 MG tablet Take 1 tablet (40 mg total) by mouth daily. 90 tablet 0  . DENTA 5000 PLUS 1.1 % CREA dental cream USE SMALL AMOUNT ON TEETH AND GUMS EVERY EVENING SPIT OUT EXCESS AND DO NOT RINSE  3  . eletriptan (RELPAX) 40 MG tablet Take 1 tablet earliest onset of migraine.  May repeat x1in 2 hours if headache persists or recurs.  Do not exceed 2 tablets in 24h 10 tablet 3  . gabapentin (NEURONTIN) 300 MG capsule Take 1 capsule (300 mg total) by mouth at bedtime. 30 capsule 5  . Levonorgestrel (KYLEENA IU) by Intrauterine route.    . montelukast (SINGULAIR) 10 MG tablet TAKE 1 TABLET AT BEDTIME BY MOUTH. 90 tablet 1  . naproxen (EC NAPROSYN) 500 MG EC tablet Take 1 tablet (500 mg total) by mouth 2 (two) times daily with a meal. (Patient not taking: Reported on 09/21/2018) 28 tablet 1  . oxymetazoline (AFRIN) 0.05 % nasal spray Place 1 spray into both nostrils 2 (two) times daily. For <7 days at a time.    . Tesamorelin Acetate 1 MG SOLR Inject 2 mg into the skin daily. 60 each 0  . Tesamorelin Acetate 2 MG SOLR Inject 2 mg into the skin  daily. 30 each 2  . tiZANidine (ZANAFLEX) 4 MG capsule Take 1 capsule (4 mg total) by mouth 3 (three) times daily as needed for muscle spasms. 90 capsule 2  . traZODone (DESYREL) 100 MG tablet Take 1 tablet (100 mg total) by mouth at bedtime. Take 1-2 tablets for sleep 180 tablet 1  . triamcinolone cream (KENALOG) 0.1 % Apply 1 application topically 2 (two) times daily. 454 g 1  . valACYclovir (VALTREX) 1000 MG tablet Take 1 tablet (1,000 mg total) by mouth daily. 90 tablet 2   No current facility-administered medications on file prior to visit.     ALLERGIES: Allergies  Allergen Reactions  . Anette Guarneri [Lurasidone Hcl] Other (See Comments)    Occular dystonia  . Seroquel [Quetiapine Fumarate] Swelling    Tongue swells   . Benzoin Other (See Comments)    blisters  . Latex Rash and Other (See Comments)  . Sulfamethoxazole-Trimethoprim Itching and Nausea And Vomiting    FAMILY HISTORY: Family History  Problem Relation Age of Onset  . Arthritis/Rheumatoid Mother   . Hypertension Mother   . Cancer Maternal Aunt        brand and lung  .  Cancer Maternal Grandmother        breasts  . Cancer Paternal Grandmother        breast and kidney    SOCIAL HISTORY: Social History   Socioeconomic History  . Marital status: Single    Spouse name: Not on file  . Number of children: 1  . Years of education: 82  . Highest education level: Some college, no degree  Occupational History  . Occupation: Nutrition Social research officer, government: [employer]  . Financial resource strain: Not on file  . Food insecurity:    Worry: Not on file    Inability: Not on file  . Transportation needs:    Medical: Not on file    Non-medical: Not on file  Tobacco Use  . Smoking status: Never Smoker  . Smokeless tobacco: Never Used  Substance and Sexual Activity  . Alcohol use: No    Alcohol/week: 0.0 standard drinks    Comment: OCC.about once a month   . Drug use: No  . Sexual  activity: Never    Partners: Male    Birth control/protection: Implant  Lifestyle  . Physical activity:    Days per week: Not on file    Minutes per session: Not on file  . Stress: Not on file  Relationships  . Social connections:    Talks on phone: Not on file    Gets together: Not on file    Attends religious service: Not on file    Active member of club or organization: Not on file    Attends meetings of clubs or organizations: Not on file    Relationship status: Not on file  . Intimate partner violence:    Fear of current or ex partner: Not on file    Emotionally abused: Not on file    Physically abused: Not on file    Forced sexual activity: Not on file  Other Topics Concern  . Not on file  Social History Narrative   Patient lives alone.   Patient works at home.   Patient has hs education.   Patient has 1 child.   Patient drinks multiple energy drinks and coffees throughout day, daily.      Patient is right-handed. She lives with her daughter in a one level home. She drinks one cup of coffee a day. She works for [employer] in [place] and is very active at work.    REVIEW OF SYSTEMS: Constitutional: No fevers, chills, or sweats, no generalized fatigue, change in appetite Eyes: No visual changes, double vision, eye pain Ear, nose and throat: No hearing loss, ear pain, nasal congestion, sore throat Cardiovascular: No chest pain, palpitations Respiratory:  No shortness of breath at rest or with exertion, wheezes GastrointestinaI: No nausea, vomiting, diarrhea, abdominal pain, fecal incontinence Genitourinary:  No dysuria, urinary retention or frequency Musculoskeletal:  No neck pain, back pain Integumentary: No rash, pruritus, skin lesions Neurological: as above Psychiatric: No depression, insomnia, anxiety Endocrine: No palpitations, fatigue, diaphoresis, mood swings, change in appetite, change in weight, increased  thirst Hematologic/Lymphatic:  No purpura, petechiae. Allergic/Immunologic: no itchy/runny eyes, nasal congestion, recent allergic reactions, rashes  PHYSICAL EXAM: Blood pressure 102/72, pulse 74, height 5\' 4"  (1.626 m), weight 206 lb (93.4 kg), SpO2 98 %. General: No acute distress.  Patient appears well-groomed.   Head:  Normocephalic/atraumatic Eyes:  Fundi examined but not visualized Neck: supple, no paraspinal tenderness, full range of motion Heart:  Regular rate and rhythm Lungs:  Clear to auscultation bilaterally Back: No paraspinal tenderness Neurological Exam: alert and oriented to person, place, and time. Attention span and concentration intact, recent and remote memory intact, fund of knowledge intact.  Speech fluent and not dysarthric, language intact.  CN II-XII intact. Bulk and tone normal, muscle strength 5/5 throughout.  Sensation to light touch intact.  Deep tendon reflexes 2+ throughout, toes downgoing.  Finger to nose and heel to shin testing intact.  Gait normal, Romberg negative.  IMPRESSION: Chronic migraine without aura, without status migrainosus, not intractable  PLAN: 1.  For preventative management, start Aimovig 70mg  monthly.  We can increase to 140mg  after 2 months if needed. 2.  Stop Excedrin.  For abortive therapy, Reyvow 100mg  3.  Limit use of pain relievers to no more than 2 days out of week to prevent risk of rebound or medication-overuse headache. 4.  Keep headache diary 5.  Exercise, hydration, caffeine cessation, sleep hygiene, monitor for and avoid triggers 6.  Consider:  magnesium citrate 400mg  daily, riboflavin 400mg  daily, and coenzyme Q10 100mg  three times daily 7.  Follow up in 4 months   Metta Clines, DO  CC: Kathe Becton, FNP

## 2018-10-05 ENCOUNTER — Encounter: Payer: Self-pay | Admitting: Neurology

## 2018-10-05 ENCOUNTER — Ambulatory Visit: Payer: BC Managed Care – PPO | Admitting: Neurology

## 2018-10-05 VITALS — BP 102/72 | HR 74 | Ht 64.0 in | Wt 206.0 lb

## 2018-10-05 DIAGNOSIS — G43709 Chronic migraine without aura, not intractable, without status migrainosus: Secondary | ICD-10-CM

## 2018-10-05 MED ORDER — ERENUMAB-AOOE 70 MG/ML ~~LOC~~ SOAJ: 70.0000 mg | Freq: Once | SUBCUTANEOUS | 0 refills | Status: AC

## 2018-10-05 MED ORDER — ERENUMAB-AOOE 70 MG/ML ~~LOC~~ SOAJ: 70.0000 mg | SUBCUTANEOUS | 11 refills | Status: DC

## 2018-10-05 NOTE — Patient Instructions (Signed)
1.  We will start Aimovig 70mg  self injection monthly.  Contact me in 2 months (after 2 full rounds) with update. 2.  STOP EXCEDRIN.  When you get a migraine, take Reyvow 100mg .  No more than one dose in 24 hours. 3.  Limit use of pain relievers to no more than 2 days out of week to prevent risk of rebound or medication-overuse headache. 4.  Keep headache diary 5.  Follow up in 4 months.

## 2018-10-05 NOTE — Addendum Note (Signed)
Addended by: Clois Comber on: 10/05/2018 02:18 PM   Modules accepted: Orders

## 2018-10-11 ENCOUNTER — Encounter: Payer: Self-pay | Admitting: Infectious Diseases

## 2018-10-30 ENCOUNTER — Other Ambulatory Visit: Payer: Self-pay | Admitting: Infectious Diseases

## 2018-10-30 DIAGNOSIS — B20 Human immunodeficiency virus [HIV] disease: Secondary | ICD-10-CM

## 2018-11-08 NOTE — Progress Notes (Signed)
Initiated on Cover My meds.Key: [id number])   Approved Effective from 11/08/2018 through 02/05/2019.

## 2018-12-21 NOTE — Telephone Encounter (Signed)
Message sent to provider 

## 2018-12-25 ENCOUNTER — Other Ambulatory Visit: Payer: Self-pay | Admitting: Infectious Diseases

## 2018-12-25 ENCOUNTER — Telehealth: Payer: Self-pay | Admitting: Neurology

## 2018-12-25 NOTE — Telephone Encounter (Signed)
Called and advised Pt we can increase to 140 mg. She will come by office to pick up sample.

## 2018-12-25 NOTE — Telephone Encounter (Signed)
Patient called regarding her Aimovig medication not helping. Please Call. Thanks

## 2018-12-30 ENCOUNTER — Other Ambulatory Visit: Payer: Self-pay | Admitting: Infectious Diseases

## 2019-01-01 NOTE — Telephone Encounter (Signed)
Pt p/u sample lot#[id number] x 5/21

## 2019-01-05 ENCOUNTER — Other Ambulatory Visit: Payer: Self-pay | Admitting: Infectious Diseases

## 2019-01-05 DIAGNOSIS — B20 Human immunodeficiency virus [HIV] disease: Secondary | ICD-10-CM

## 2019-01-05 DIAGNOSIS — B009 Herpesviral infection, unspecified: Secondary | ICD-10-CM

## 2019-01-25 NOTE — Progress Notes (Signed)
Prior authorization for Aimovig submitted to covermymeds.  Waiting response.

## 2019-01-29 NOTE — Progress Notes (Signed)
Aimovig approved through 01/24/2020.

## 2019-02-05 ENCOUNTER — Telehealth: Payer: Self-pay | Admitting: Neurology

## 2019-02-05 MED ORDER — AIMOVIG 140 MG/ML ~~LOC~~ SOAJ: 140.0000 mg | SUBCUTANEOUS | 3 refills | Status: DC

## 2019-02-05 NOTE — Telephone Encounter (Signed)
[facility] calling regarding prescription for Aimovig, pt states that dosage was increased to 140 mg instead of 70mg . Pharmacy needs a new prescription if that is the case.

## 2019-02-05 NOTE — Telephone Encounter (Signed)
New rx for Aimovig 140mg  sent to pharmacy.

## 2019-02-08 ENCOUNTER — Encounter: Payer: Self-pay | Admitting: Neurology

## 2019-02-08 NOTE — Progress Notes (Signed)
Virtual Visit via Video Note The purpose of this virtual visit is to provide medical care while limiting exposure to the novel coronavirus.    Consent was obtained for video visit:  Yes.   Answered questions that patient had about telehealth interaction:  Yes.   I discussed the limitations, risks, security and privacy concerns of performing an evaluation and management service by telemedicine. I also discussed with the patient that there may be a patient responsible charge related to this service. The patient expressed understanding and agreed to proceed.  Pt location: Home Physician Location: office Name of referring provider:  [name], FNP I connected with [name] at patients initiation/request on 02/09/2019 at  9:50 AM EDT by video enabled telemedicine application and verified that I am speaking with the correct person using two identifiers. Pt MRN:  [id number] Pt DOB:  06/10/1981 Video Participants:  [name]   History of Present Illness:  [name] is a 38 year old African-American woman with depression, PTSD, HIV positive and asthma who follows up for migraines.  UPDATE: Started on Aimovig, increased to 140mg .  This is only the second month because she had trouble getting it sooner. She took her injection only a few days ago.  She notes slight improvement so far. Intensity:  moderate Duration:  40-60 minutes but with dull ache for rest of day. Frequency:  daily Frequency of abortive medication:  Excedrin 3 days a week Current NSAIDS:none Current analgesics:Excedrin MIgraine Current triptans: none Current ergotamine:none Current anti-emetic:none Current muscle relaxants:Tizanidine 4mg  Current anti-anxiolytic:none Current sleep aide:trazodone Current Antihypertensive medications:none Current Antidepressant medications:Citalopram 40mg , Wellbutrin XL 300mg  Current Anticonvulsant medications: none Current  anti-CGRP:Aimovig 140mg  Current Vitamins/Herbal/Supplements:none Current Antihistamines/Decongestants:none Other therapy:Reyvow (abortive)  Caffeine:1 cup of coffee every other day Alcohol:none Smoker:none Diet:5-6 bottles water daily Exercise:She tries to Depression:yes; Anxiety:yes Other pain:anxiety Sleep hygiene:Poor. Sleeps 3 to 5 hours a night.   She feels fatigue during the day.  She had a sleep study that showed "excessive snoring" which wakes her up but not sleep apnea.  HISTORY: Onset:  38 years old Location:Varies: Sometimes right-sided; Bilateral behind eyes; bilateral back of head Quality:squeezing Initial intensity:Moderate to severe.Shedenies new headache, thunderclap headache or severe headache that wakes herfrom sleep. Aura:Sometimes sees colors with squiggly lines (sometimes without headahce) Prodrome:no Postdrome:no Associated symptoms: Nausea (starts before headache), sometimes vomit, photophobia, phonophobia, osmophobia, blurred vision.Shedenies associated unilateral numbness or weakness. Initial duration:40-60 minutes severe, but can last all day Initial Frequency:daily Initial Frequency of abortive medication:Excedrin Migraine daily for headache Triggers:  None Relieving factors:  Peppermint oil, quiet room Activity:aggravates  Past NSAIDS:Ibuprofen 800mg  Past analgesics:Fioricet, tramadol, Excedrin Migraine Extra-strength Past abortive triptans:Sumatriptan 100mg , Maxalt MLT 10mg , Relpax 40mg  Past abortive ergotamine:none Past muscle relaxants:none Past anti-emetic:none Past antihypertensive medications:Maybe Inderal Past antidepressant medications:Fluoxetine, amitriptyline Past anticonvulsant medications:Lamotrigine 50mg , topiramate ER 100mg  at bedtime Past anti-CGRP:none Past vitamins/Herbal/Supplements:none Past antihistamines/decongestants:none Other past  therapies:none    Family history of headache:Mom, maternal aunt (passed away due to brain cancer)  Past Medical History: Past Medical History:  Diagnosis Date  . Allergy   . Anemia   . Anxiety   . Anxiety state, unspecified 12/07/2012  . Arthritis   . Asthma   . Bipolar disorder, unspecified ([place]) 12/07/2012  . Depression   . Diabetes mellitus without complication ([place])    no meds currently- controlled with diet  . GERD (gastroesophageal reflux disease)   . GERD (gastroesophageal reflux disease) 12/07/2012  . Headache(784.0)    migraines  . Herpes  genitalis in women 12/07/2012  . HIV infection ([place])   . Human immunodeficiency virus (HIV) disease ([place]) 12/07/2012  . Neuromuscular disorder ([place])    carpal tunnel in both arms  . SHINGLES 01/31/2009   Qualifier: Diagnosis of  By: Tomma Lightning MD, Claiborne Billings    . Type II or unspecified type diabetes mellitus without mention of complication, not stated as uncontrolled 12/07/2012  . Unspecified asthma(493.90) 12/07/2012    Medications: Outpatient Encounter Medications as of 02/09/2019  Medication Sig Note  . albuterol (PROAIR HFA) 108 (90 Base) MCG/ACT inhaler INHALE TWO PUFFS INTO THE LUNGS EVERY 6 HOURS AS NEEDED FOR WHEEZING.   Marland Kitchen albuterol (PROVENTIL) (2.5 MG/3ML) 0.083% nebulizer solution Take 3 mLs (2.5 mg total) by nebulization every 6 (six) hours as needed for wheezing or shortness of breath (If no relief from MDI inhaler).   . beclomethasone (QVAR) 40 MCG/ACT inhaler INHALE TWO PUFFS INTO THE LUNGS TWO TIMES DAILY.   Marland Kitchen BIKTARVY 50-200-25 MG TABS tablet TAKE 1 TABLET BY MOUTH EVERY DAY   . buPROPion (WELLBUTRIN XL) 300 MG 24 hr tablet Take 1 tablet (300 mg total) by mouth daily.   . cetirizine (ZYRTEC) 10 MG tablet Take 2 tablets (20 mg total) by mouth at bedtime.   . citalopram (CELEXA) 40 MG tablet Take 1 tablet (40 mg total) by mouth daily.   . DENTA 5000 PLUS 1.1 % CREA dental cream USE SMALL AMOUNT ON TEETH AND GUMS EVERY EVENING SPIT  OUT EXCESS AND DO NOT RINSE   . eletriptan (RELPAX) 40 MG tablet Take 1 tablet earliest onset of migraine.  May repeat x1in 2 hours if headache persists or recurs.  Do not exceed 2 tablets in 24h   . Erenumab-aooe (AIMOVIG) 140 MG/ML SOAJ Inject 140 mg into the skin every 30 (thirty) days.   Marland Kitchen gabapentin (NEURONTIN) 300 MG capsule Take 1 capsule (300 mg total) by mouth at bedtime.   . Levonorgestrel (KYLEENA IU) by Intrauterine route.   . montelukast (SINGULAIR) 10 MG tablet TAKE 1 TABLET AT BEDTIME BY MOUTH.   Marland Kitchen oxymetazoline (AFRIN) 0.05 % nasal spray Place 1 spray into both nostrils 2 (two) times daily. For <7 days at a time. 08/04/2017: Takes as needed   . Tesamorelin Acetate 1 MG SOLR Inject 2 mg into the skin daily.   Marland Kitchen Tesamorelin Acetate 2 MG SOLR Inject 2 mg into the skin daily.   Marland Kitchen tiZANidine (ZANAFLEX) 4 MG capsule Take 1 capsule (4 mg total) by mouth 3 (three) times daily as needed for muscle spasms. 01/31/2018: Takes as needed.   . traZODone (DESYREL) 100 MG tablet Take 1 tablet (100 mg total) by mouth at bedtime. Take 1-2 tablets for sleep   . triamcinolone cream (KENALOG) 0.1 % Apply 1 application topically 2 (two) times daily.   . valACYclovir (VALTREX) 1000 MG tablet TAKE 1 TABLET(1000 MG) BY MOUTH DAILY    No facility-administered encounter medications on file as of 02/09/2019.     Allergies: Allergies  Allergen Reactions  . Anette Guarneri [Lurasidone Hcl] Other (See Comments)    Occular dystonia  . Seroquel [Quetiapine Fumarate] Swelling    Tongue swells   . Benzoin Other (See Comments)    blisters  . Latex Rash and Other (See Comments)  . Sulfamethoxazole-Trimethoprim Itching and Nausea And Vomiting    Family History: Family History  Problem Relation Age of Onset  . Arthritis/Rheumatoid Mother   . Hypertension Mother   . Cancer Maternal Aunt  brand and lung  . Cancer Maternal Grandmother        breasts  . Cancer Paternal Grandmother        breast and kidney     Social History: Social History   Socioeconomic History  . Marital status: Single    Spouse name: Not on file  . Number of children: 1  . Years of education: 78  . Highest education level: Some college, no degree  Occupational History  . Occupation: Nutrition Social research officer, government: [employer]  . Financial resource strain: Not on file  . Food insecurity    Worry: Not on file    Inability: Not on file  . Transportation needs    Medical: Not on file    Non-medical: Not on file  Tobacco Use  . Smoking status: Never Smoker  . Smokeless tobacco: Never Used  Substance and Sexual Activity  . Alcohol use: No    Alcohol/week: 0.0 standard drinks    Comment: OCC.about once a month   . Drug use: No  . Sexual activity: Never    Partners: Male    Birth control/protection: Implant  Lifestyle  . Physical activity    Days per week: Not on file    Minutes per session: Not on file  . Stress: Not on file  Relationships  . Social Herbalist on phone: Not on file    Gets together: Not on file    Attends religious service: Not on file    Active member of club or organization: Not on file    Attends meetings of clubs or organizations: Not on file    Relationship status: Not on file  . Intimate partner violence    Fear of current or ex partner: Not on file    Emotionally abused: Not on file    Physically abused: Not on file    Forced sexual activity: Not on file  Other Topics Concern  . Not on file  Social History Narrative   Patient lives alone.   Patient works at home.   Patient has hs education.   Patient has 1 child.   Patient drinks multiple energy drinks and coffees throughout day, daily.      Patient is right-handed. She lives with her daughter in a one level home. She drinks one cup of coffee a day. She works for [employer] in [place] and is very active at work.    Observations/Objective:   Height 5\' 4"   (1.626 m), weight 198 lb (89.8 kg). No acute distress.  Alert and oriented.  Speech fluent and not dysarthric.  Language intact.  Face symmetric.     Assessment and Plan:   Chronic migraine without aura, without status migrainosus, not intractable.  1.  For preventative management, she will continue Aimovig 140mg .  She will contact me just prior to her 4th injection.  If ineffective, we can try Emgality or Ajovy. 2.  For abortive therapy, Excedrin Migraine 3.  Limit use of pain relievers to no more than 2 days out of week to prevent risk of rebound or medication-overuse headache. 4.  Keep headache diary 5.  Exercise, hydration, caffeine cessation, sleep hygiene, monitor for and avoid triggers 6.  Consider:  magnesium citrate 400mg  daily, riboflavin 400mg  daily, and coenzyme Q10 100mg  three times daily 7. Always keep in mind that currently taking a hormone or birth control may be a possible trigger or aggravating  factor for migraine. 8. Follow up in 4 months.   Follow Up Instructions:    -I discussed the assessment and treatment plan with the patient. The patient was provided an opportunity to ask questions and all were answered. The patient agreed with the plan and demonstrated an understanding of the instructions.   The patient was advised to call back or seek an in-person evaluation if the symptoms worsen or if the condition fails to improve as anticipated.   Dudley Major, DO

## 2019-02-09 ENCOUNTER — Other Ambulatory Visit: Payer: Self-pay

## 2019-02-09 ENCOUNTER — Telehealth (INDEPENDENT_AMBULATORY_CARE_PROVIDER_SITE_OTHER): Payer: BLUE CROSS/BLUE SHIELD | Admitting: Neurology

## 2019-02-09 ENCOUNTER — Encounter: Payer: Self-pay | Admitting: Neurology

## 2019-02-09 VITALS — Ht 64.0 in | Wt 198.0 lb

## 2019-02-09 DIAGNOSIS — G43709 Chronic migraine without aura, not intractable, without status migrainosus: Secondary | ICD-10-CM | POA: Diagnosis not present

## 2019-03-02 ENCOUNTER — Other Ambulatory Visit: Payer: Self-pay | Admitting: Infectious Diseases

## 2019-03-02 DIAGNOSIS — B20 Human immunodeficiency virus [HIV] disease: Secondary | ICD-10-CM

## 2019-04-04 ENCOUNTER — Other Ambulatory Visit: Payer: Self-pay

## 2019-04-04 ENCOUNTER — Other Ambulatory Visit: Payer: BLUE CROSS/BLUE SHIELD

## 2019-04-04 DIAGNOSIS — B20 Human immunodeficiency virus [HIV] disease: Secondary | ICD-10-CM

## 2019-04-04 DIAGNOSIS — Z79899 Other long term (current) drug therapy: Secondary | ICD-10-CM

## 2019-04-04 DIAGNOSIS — Z113 Encounter for screening for infections with a predominantly sexual mode of transmission: Secondary | ICD-10-CM

## 2019-04-05 LAB — T-HELPER CELL (CD4) - (RCID CLINIC ONLY)
CD4 % Helper T Cell: 45 % (ref 33–65)
CD4 T Cell Abs: 1560 /uL (ref 400–1790)

## 2019-04-07 LAB — CBC
HCT: 42.4 % (ref 35.0–45.0)
Hemoglobin: 14.7 g/dL (ref 11.7–15.5)
MCH: 32.9 pg (ref 27.0–33.0)
MCHC: 34.7 g/dL (ref 32.0–36.0)
MCV: 94.9 fL (ref 80.0–100.0)
MPV: 10.6 fL (ref 7.5–12.5)
Platelets: 309 10*3/uL (ref 140–400)
RBC: 4.47 10*6/uL (ref 3.80–5.10)
RDW: 12.7 % (ref 11.0–15.0)
WBC: 8.9 10*3/uL (ref 3.8–10.8)

## 2019-04-07 LAB — HIV-1 RNA QUANT-NO REFLEX-BLD
HIV 1 RNA Quant: 29 copies/mL — ABNORMAL HIGH
HIV-1 RNA Quant, Log: 1.46 Log copies/mL — ABNORMAL HIGH

## 2019-04-07 LAB — COMPREHENSIVE METABOLIC PANEL
AG Ratio: 1.7 (calc) (ref 1.0–2.5)
ALT: 18 U/L (ref 6–29)
AST: 18 U/L (ref 10–30)
Albumin: 4.1 g/dL (ref 3.6–5.1)
Alkaline phosphatase (APISO): 64 U/L (ref 31–125)
BUN/Creatinine Ratio: 11 (calc) (ref 6–22)
BUN: 13 mg/dL (ref 7–25)
CO2: 24 mmol/L (ref 20–32)
Calcium: 9.6 mg/dL (ref 8.6–10.2)
Chloride: 103 mmol/L (ref 98–110)
Creat: 1.18 mg/dL — ABNORMAL HIGH (ref 0.50–1.10)
Globulin: 2.4 g/dL (calc) (ref 1.9–3.7)
Glucose, Bld: 106 mg/dL — ABNORMAL HIGH (ref 65–99)
Potassium: 4.3 mmol/L (ref 3.5–5.3)
Sodium: 136 mmol/L (ref 135–146)
Total Bilirubin: 0.6 mg/dL (ref 0.2–1.2)
Total Protein: 6.5 g/dL (ref 6.1–8.1)

## 2019-04-07 LAB — RPR: RPR Ser Ql: NONREACTIVE

## 2019-04-07 LAB — LIPID PANEL
Cholesterol: 190 mg/dL (ref <200)
HDL: 40 mg/dL — ABNORMAL LOW (ref >=50)
LDL Cholesterol (Calc): 128 mg/dL (calc) — ABNORMAL HIGH
Non-HDL Cholesterol (Calc): 150 mg/dL (calc) — ABNORMAL HIGH (ref <130)
Total CHOL/HDL Ratio: 4.8 (calc) (ref <5.0)
Triglycerides: 109 mg/dL (ref <150)

## 2019-04-17 ENCOUNTER — Encounter: Payer: BLUE CROSS/BLUE SHIELD | Admitting: Infectious Diseases

## 2019-04-26 ENCOUNTER — Encounter: Payer: Self-pay | Admitting: Infectious Diseases

## 2019-05-04 ENCOUNTER — Encounter: Payer: Self-pay | Admitting: *Deleted

## 2019-06-07 ENCOUNTER — Encounter

## 2019-07-01 ENCOUNTER — Other Ambulatory Visit: Payer: Self-pay | Admitting: Infectious Diseases

## 2019-07-01 DIAGNOSIS — B009 Herpesviral infection, unspecified: Secondary | ICD-10-CM

## 2019-07-01 DIAGNOSIS — B20 Human immunodeficiency virus [HIV] disease: Secondary | ICD-10-CM

## 2019-07-17 ENCOUNTER — Telehealth: Payer: Self-pay | Admitting: Pharmacy Technician

## 2019-07-17 NOTE — Telephone Encounter (Signed)
[facility] Patient Advocate Encounter  Glen Allen called from [place] seeing if we had an alternate phone number for the patient. They have been trying to reach her with no success in reference to clinical follow-up assessment for Egrifta. I called the patient to let her know and about the missed appointment. She was at the store at the time and would call back to discuss refill and schedule an appointment.

## 2019-07-26 ENCOUNTER — Ambulatory Visit (INDEPENDENT_AMBULATORY_CARE_PROVIDER_SITE_OTHER): Payer: BLUE CROSS/BLUE SHIELD | Admitting: Infectious Diseases

## 2019-07-26 ENCOUNTER — Encounter: Payer: Self-pay | Admitting: Infectious Diseases

## 2019-07-26 VITALS — Ht 64.0 in | Wt 214.0 lb

## 2019-07-26 DIAGNOSIS — R87612 Low grade squamous intraepithelial lesion on cytologic smear of cervix (LGSIL): Secondary | ICD-10-CM

## 2019-07-26 DIAGNOSIS — Z113 Encounter for screening for infections with a predominantly sexual mode of transmission: Secondary | ICD-10-CM | POA: Diagnosis not present

## 2019-07-26 DIAGNOSIS — B20 Human immunodeficiency virus [HIV] disease: Secondary | ICD-10-CM

## 2019-07-26 DIAGNOSIS — E669 Obesity, unspecified: Secondary | ICD-10-CM

## 2019-07-26 DIAGNOSIS — Z79899 Other long term (current) drug therapy: Secondary | ICD-10-CM

## 2019-07-26 NOTE — Progress Notes (Signed)
   Subjective:    Patient ID: [name], female    DOB: October 13, 1980, 38 y.o.   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 Normal PAP 09-2018.Marland Kitchen  Has nexplanon, was seen in GYN 05-2018 for heavy bleeding. She had OCPs added (and bleeding has improved over last 3 days). She has switched GYN [name]). Had endometrial bx prev that was benign.  Has regular f/u at behavioral health -->psychiatry (PTSD, insomnia).  Today has been having "sinus issues"- having post nasal drip, wakes up with runny nose, sinus pressure in face. She takes zyrtec and started sudafed yesterday. Worse at night. Just restarted flonase 2-3 days ago.  Asthma has been "trying to flare up" over last week but prior to that has been good. Has albuterol and flovent at home. Has restarted flovent. Has not needed. Albuterol.   Wt is 214. Has not been able to exercise recently.   Had colonoscopy for abd pain recently and was told was normal. Pain from scar tissue.   HIV 1 RNA Quant (copies/mL)  Date Value  04/04/2019 29 (H)  09/21/2018 <20 NOT DETECTED  06/27/2018 43 (H)   CD4 T Cell Abs (/uL)  Date Value  04/04/2019 1,560  06/27/2018 1,160  12/12/2017 970    Review of Systems  Constitutional: Negative for appetite change, chills, fever and unexpected weight change.  HENT: Positive for postnasal drip, rhinorrhea, sinus pressure and sinus pain.   Respiratory: Positive for cough.   Gastrointestinal: Positive for constipation. Negative for diarrhea.  Genitourinary: Negative for difficulty urinating.  has kylena, IUD. Nexplanon caused heavy bleeding and migraines.  Please see HPI. All other systems reviewed and negative.      Objective:   Physical Exam Phone visit.        Assessment & Plan:

## 2019-07-26 NOTE — Assessment & Plan Note (Signed)
She is not sure she wants to go back on egrifta. Does not like shot, mixing.  Will have her seen at wt loss clinic via [facility].

## 2019-07-26 NOTE — Assessment & Plan Note (Signed)
Most recent PAP normal.  appreciatte GYN f/u.

## 2019-07-26 NOTE — Assessment & Plan Note (Signed)
Has gotten flu vax Will continue biktarvy.  Can't give condoms via phone.  She is doing well.  Will see back in 6 months.

## 2019-08-16 ENCOUNTER — Telehealth: Payer: Self-pay

## 2019-08-16 NOTE — Telephone Encounter (Signed)
Patient called office today to discuss medication options best for her while trying to get pregnant. She recently got engaged and would like to discuss what her options would be. Patient is holding off on pregnancy until she speaks with MD. Will forward message to Cassie, Pharmacist and MD. Aundria Rud, [place]

## 2019-08-16 NOTE — Telephone Encounter (Signed)
She has some resistance so she would probably need to come in and see a provider to discuss options. I am out for awhile, so maybe she could see Colletta Maryland or Marya Amsler next week?

## 2019-08-20 NOTE — Telephone Encounter (Signed)
Be glad to see her next week Thanks jeff

## 2019-08-28 ENCOUNTER — Other Ambulatory Visit: Payer: Self-pay

## 2019-08-28 ENCOUNTER — Ambulatory Visit (INDEPENDENT_AMBULATORY_CARE_PROVIDER_SITE_OTHER): Payer: BLUE CROSS/BLUE SHIELD | Admitting: Infectious Diseases

## 2019-08-28 ENCOUNTER — Encounter: Payer: Self-pay | Admitting: Infectious Diseases

## 2019-08-28 VITALS — BP 131/88 | HR 75 | Temp 98.0°F | Ht 64.0 in | Wt 217.0 lb

## 2019-08-28 DIAGNOSIS — R87612 Low grade squamous intraepithelial lesion on cytologic smear of cervix (LGSIL): Secondary | ICD-10-CM

## 2019-08-28 DIAGNOSIS — F3162 Bipolar disorder, current episode mixed, moderate: Secondary | ICD-10-CM | POA: Diagnosis not present

## 2019-08-28 DIAGNOSIS — E785 Hyperlipidemia, unspecified: Secondary | ICD-10-CM

## 2019-08-28 DIAGNOSIS — B20 Human immunodeficiency virus [HIV] disease: Secondary | ICD-10-CM

## 2019-08-28 DIAGNOSIS — Z79899 Other long term (current) drug therapy: Secondary | ICD-10-CM

## 2019-08-28 DIAGNOSIS — Z113 Encounter for screening for infections with a predominantly sexual mode of transmission: Secondary | ICD-10-CM

## 2019-08-28 DIAGNOSIS — R739 Hyperglycemia, unspecified: Secondary | ICD-10-CM

## 2019-08-28 DIAGNOSIS — E669 Obesity, unspecified: Secondary | ICD-10-CM

## 2019-08-28 NOTE — Assessment & Plan Note (Signed)
Will check her lipids today

## 2019-08-28 NOTE — Assessment & Plan Note (Signed)
Appreciate GYN f/u PAP normal last year.

## 2019-08-28 NOTE — Assessment & Plan Note (Signed)
Has had yeast infections, occas dizzines, prev Glc up to 130.  Will check her A1C today.

## 2019-08-28 NOTE — Assessment & Plan Note (Signed)
Was seen by Psych. [facility] (?) Encouraged to f/u.

## 2019-08-28 NOTE — Addendum Note (Signed)
Addended by: Dolan Amen D on: 08/28/2019 04:04 PM   Modules accepted: Orders

## 2019-08-28 NOTE — Progress Notes (Signed)
   Subjective:    Patient ID: [name], female    DOB: 1981/01/11, 39 y.o.   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 Normal PAP 09-2018.Marland Kitchen  She has f/u with GYN ([facility]). Had endometrial bxprevthat was benign.  Has regular f/u at behavioral health -->psychiatry (PTSD, insomnia).  Doing well- is engaged, now a Community education officer at [employer].  Fiance is negative. Wants to have 1 kid.  Has kylena.  Doing well with biktarvy, blames if for fatigue.  She was treated by her PCP for bronchitis recently. She has occas asthma flares, she relates to weather.   HIV 1 RNA Quant (copies/mL)  Date Value  04/04/2019 29 (H)  09/21/2018 <20 NOT DETECTED  06/27/2018 43 (H)   CD4 T Cell Abs (/uL)  Date Value  04/04/2019 1,560  06/27/2018 1,160  12/12/2017 970     Review of Systems  Constitutional: Negative for appetite change and unexpected weight change.  Gastrointestinal: Negative for constipation and diarrhea.  Genitourinary: Positive for vaginal discharge. Negative for difficulty urinating and menstrual problem.  Neurological: Positive for dizziness.  Psychiatric/Behavioral: Positive for agitation and sleep disturbance.  Please see HPI. All other systems reviewed and negative.      Objective:   Physical Exam Constitutional:      General: She is not in acute distress.    Appearance: Normal appearance. She is obese. She is not ill-appearing.  HENT:     Mouth/Throat:     Mouth: Mucous membranes are moist.     Pharynx: No oropharyngeal exudate.  Eyes:     Extraocular Movements: Extraocular movements intact.     Pupils: Pupils are equal, round, and reactive to light.  Cardiovascular:     Rate and Rhythm: Normal rate and regular rhythm.  Pulmonary:     Effort: Pulmonary effort is normal.     Breath sounds: Normal breath sounds.  Abdominal:     General: Bowel sounds are normal. There is no distension.     Palpations: Abdomen is soft.   Tenderness: There is no abdominal tenderness.  Musculoskeletal:        General: Normal range of motion.     Cervical back: Normal range of motion.     Right lower leg: No edema.     Left lower leg: No edema.  Neurological:     Mental Status: She is alert.  Psychiatric:        Mood and Affect: Mood normal.           Assessment & Plan:

## 2019-08-28 NOTE — Assessment & Plan Note (Signed)
She is doing well Will check her labs Spoke about PreP for her fiance.  Will check her labs today.  rtc in 9 months.

## 2019-08-28 NOTE — Assessment & Plan Note (Signed)
Will re-refer to wt loss clinic.

## 2019-08-29 LAB — URINE CYTOLOGY ANCILLARY ONLY
Chlamydia: NEGATIVE
Comment: NEGATIVE
Comment: NORMAL
Neisseria Gonorrhea: NEGATIVE

## 2019-08-29 LAB — T-HELPER CELL (CD4) - (RCID CLINIC ONLY)
CD4 % Helper T Cell: 43 % (ref 33–65)
CD4 T Cell Abs: 1357 /uL (ref 400–1790)

## 2019-09-03 LAB — COMPREHENSIVE METABOLIC PANEL
AG Ratio: 1.8 (calc) (ref 1.0–2.5)
ALT: 12 U/L (ref 6–29)
AST: 13 U/L (ref 10–30)
Albumin: 4.1 g/dL (ref 3.6–5.1)
Alkaline phosphatase (APISO): 90 U/L (ref 31–125)
BUN: 15 mg/dL (ref 7–25)
CO2: 28 mmol/L (ref 20–32)
Calcium: 9.2 mg/dL (ref 8.6–10.2)
Chloride: 103 mmol/L (ref 98–110)
Creat: 1 mg/dL (ref 0.50–1.10)
Globulin: 2.3 g/dL (calc) (ref 1.9–3.7)
Glucose, Bld: 109 mg/dL — ABNORMAL HIGH (ref 65–99)
Potassium: 4.3 mmol/L (ref 3.5–5.3)
Sodium: 137 mmol/L (ref 135–146)
Total Bilirubin: 0.4 mg/dL (ref 0.2–1.2)
Total Protein: 6.4 g/dL (ref 6.1–8.1)

## 2019-09-03 LAB — CBC
HCT: 42 % (ref 35.0–45.0)
Hemoglobin: 14.4 g/dL (ref 11.7–15.5)
MCH: 32.7 pg (ref 27.0–33.0)
MCHC: 34.3 g/dL (ref 32.0–36.0)
MCV: 95.5 fL (ref 80.0–100.0)
MPV: 10 fL (ref 7.5–12.5)
Platelets: 325 10*3/uL (ref 140–400)
RBC: 4.4 10*6/uL (ref 3.80–5.10)
RDW: 13 % (ref 11.0–15.0)
WBC: 7.4 10*3/uL (ref 3.8–10.8)

## 2019-09-03 LAB — LIPID PANEL
Cholesterol: 220 mg/dL — ABNORMAL HIGH (ref <200)
HDL: 39 mg/dL — ABNORMAL LOW (ref >=50)
LDL Cholesterol (Calc): 156 mg/dL (calc) — ABNORMAL HIGH
Non-HDL Cholesterol (Calc): 181 mg/dL (calc) — ABNORMAL HIGH (ref <130)
Total CHOL/HDL Ratio: 5.6 (calc) — ABNORMAL HIGH (ref <5.0)
Triglycerides: 124 mg/dL (ref <150)

## 2019-09-03 LAB — RPR: RPR Ser Ql: NONREACTIVE

## 2019-09-03 LAB — HIV-1 RNA ULTRAQUANT REFLEX TO GENTYP+
HIV 1 RNA Quant: <20 copies/mL
HIV-1 RNA Quant, Log: <1.3 Log copies/mL

## 2019-09-03 LAB — HEMOGLOBIN A1C
Hgb A1c MFr Bld: 5.6 % of total Hgb (ref <5.7)
Mean Plasma Glucose: 114 (calc)
eAG (mmol/L): 6.3 (calc)

## 2019-09-27 ENCOUNTER — Telehealth: Payer: Self-pay | Admitting: Neurology

## 2019-09-27 NOTE — Telephone Encounter (Signed)
Responded via mychart original message that patient needs an appointment.

## 2019-09-27 NOTE — Telephone Encounter (Signed)
Patient called and said she has had an ongoing headache for over 3 weeks since about August 23, 2019. She said nothing she has done has helped.   Patient stated it hurts worse when she coughs, sneezes, or laughs.   Patient did say she has been tested for Covid-19 and it was negative.  Please call patient to help her with this.Marland Kitchen  [facility] on [address] in [place]

## 2019-10-01 ENCOUNTER — Encounter: Payer: Self-pay | Admitting: Neurology

## 2019-10-01 NOTE — Progress Notes (Signed)
Virtual Visit via Video Note The purpose of this virtual visit is to provide medical care while limiting exposure to the novel coronavirus.    Consent was obtained for video visit:  Yes.   Answered questions that patient had about telehealth interaction:  Yes.   I discussed the limitations, risks, security and privacy concerns of performing an evaluation and management service by telemedicine. I also discussed with the patient that there may be a patient responsible charge related to this service. The patient expressed understanding and agreed to proceed.  Pt location: Home Physician Location: office Name of referring provider:  Andria Frames, PA-C I connected with Stacie Acres at patients initiation/request on 10/02/2019 at  2:30 PM EST by video enabled telemedicine application and verified that I am speaking with the correct person using two identifiers. Pt MRN:  [id number] Pt DOB:  [birth year] Video Participants:  Stacie Acres   History of Present Illness:  [name] is a 39 year old African-American woman with depression, PTSD, HIV positive and asthma who follows up for migraines.  UPDATE: She had reduced headache frequency after last visit.  She was averaging 1 to 2 a week, but less severe and usually the week prior to next injection.  Then since 08/23/2019, she reports a constant headache.  No specific trigger.  Intensity fluctuates but persistent.  It is exacerbated with coughing.    Frequency of abortive medication:  Excedrin daily Current NSAIDS:none Current analgesics:Excedrin MIgraine Current triptans:none Current ergotamine:none Current anti-emetic:none Current muscle relaxants:Tizanidine 4mg  Current anti-anxiolytic:none Current sleep aide:trazodone Current Antihypertensive medications:none Current Antidepressant medications:Citalopram 40mg  Current Anticonvulsant medications:gabapentin 300mg  at bedtime Current  anti-CGRP:Aimovig 140mg  Current Vitamins/Herbal/Supplements:none Current Antihistamines/Decongestants:none Current hormone/birth control:  Kyleena IU Other therapy:none  Caffeine:1 cup of coffee every other day Alcohol:none Smoker:none Diet:5-6 bottles water daily Exercise:She tries to Depression:yes; Anxiety:yes Other pain:anxiety Sleep hygiene:Poor. Sleeps 3 to 5 hours a night.She feels fatigue during the day. She had a sleep study that showed "excessive snoring" which wakes her up but not sleep apnea.  HISTORY: Onset:  39 years old Location:Varies: Sometimes right-sided; Bilateral behind eyes; bilateral back of head Quality:squeezing Initial intensity:Moderate to severe.Shedenies new headache, thunderclap headache or severe headache that wakes herfrom sleep. Aura:Sometimes sees colors with squiggly lines (sometimes without headahce) Prodrome:no Postdrome:no Associated symptoms: Nausea(starts before headache), sometimes vomit, photophobia, phonophobia, osmophobia, blurred vision.Shedenies associated unilateral numbness or weakness. Initial duration:40-60 minutes severe, but can last all day InitialFrequency:daily InitialFrequency of abortive medication:Excedrin Migraine daily for headache Triggers:  None Relieving factors:  Peppermint oil, quiet room Activity:aggravates  Past NSAIDS:Ibuprofen 800mg  Past analgesics:Fioricet, tramadol, Excedrin Migraine Extra-strength Past abortive triptans:Sumatriptan 100mg , Maxalt MLT 10mg , Relpax 40mg  Past abortive ergotamine:none Past muscle relaxants:none Past anti-emetic:none Past antihypertensive medications:Maybe Inderal Past antidepressant medications:Fluoxetine, amitriptyline; Wellbutrin XL 300mg  Past anticonvulsant medications:Lamotrigine 50mg , topiramate ER 100mg  at bedtime Past anti-CGRP:none Past vitamins/Herbal/Supplements:none Past  antihistamines/decongestants:none Other past therapies:none  Family history of headache:Mom, maternal aunt (passed away due to brain cancer)  Past Medical History: Past Medical History:  Diagnosis Date  . Allergy   . Anemia   . Anxiety   . Anxiety state, unspecified 12/07/2012  . Arthritis   . Asthma   . Bipolar disorder, unspecified ([place]) 12/07/2012  . Depression   . Diabetes mellitus without complication ([place])    no meds currently- controlled with diet  . GERD (gastroesophageal reflux disease)   . GERD (gastroesophageal reflux disease) 12/07/2012  . Headache(784.0)    migraines  . Herpes genitalis in women 12/07/2012  .  HIV infection ([place])   . Human immunodeficiency virus (HIV) disease ([place]) 12/07/2012  . Neuromuscular disorder ([place])    carpal tunnel in both arms  . SHINGLES 01/31/2009   Qualifier: Diagnosis of  By: Tomma Lightning MD, Claiborne Billings    . Type II or unspecified type diabetes mellitus without mention of complication, not stated as uncontrolled 12/07/2012  . Unspecified asthma(493.90) 12/07/2012    Medications: Outpatient Encounter Medications as of 10/02/2019  Medication Sig Note  . albuterol (PROAIR HFA) 108 (90 Base) MCG/ACT inhaler INHALE TWO PUFFS INTO THE LUNGS EVERY 6 HOURS AS NEEDED FOR WHEEZING.   Marland Kitchen albuterol (PROVENTIL) (2.5 MG/3ML) 0.083% nebulizer solution Take 3 mLs (2.5 mg total) by nebulization every 6 (six) hours as needed for wheezing or shortness of breath (If no relief from MDI inhaler).   . beclomethasone (QVAR) 40 MCG/ACT inhaler INHALE TWO PUFFS INTO THE LUNGS TWO TIMES DAILY.   Marland Kitchen BIKTARVY 50-200-25 MG TABS tablet TAKE 1 TABLET BY MOUTH EVERY DAY   . buPROPion (WELLBUTRIN XL) 300 MG 24 hr tablet Take 1 tablet (300 mg total) by mouth daily.   . cetirizine (ZYRTEC) 10 MG tablet Take 2 tablets (20 mg total) by mouth at bedtime.   . citalopram (CELEXA) 40 MG tablet Take 1 tablet (40 mg total) by mouth daily.   . DENTA 5000 PLUS 1.1 % CREA dental cream USE  SMALL AMOUNT ON TEETH AND GUMS EVERY EVENING SPIT OUT EXCESS AND DO NOT RINSE   . Erenumab-aooe (AIMOVIG) 140 MG/ML SOAJ Inject 140 mg into the skin every 30 (thirty) days.   Marland Kitchen gabapentin (NEURONTIN) 300 MG capsule Take 1 capsule (300 mg total) by mouth at bedtime.   . Levonorgestrel (KYLEENA IU) by Intrauterine route.   . montelukast (SINGULAIR) 10 MG tablet TAKE 1 TABLET AT BEDTIME BY MOUTH.   Marland Kitchen omeprazole (PRILOSEC) 40 MG capsule daily.   Marland Kitchen oxymetazoline (AFRIN) 0.05 % nasal spray Place 1 spray into both nostrils 2 (two) times daily. For <7 days at a time. 08/04/2017: Takes as needed   . Tesamorelin Acetate 1 MG SOLR Inject 2 mg into the skin daily. (Patient not taking: Reported on 08/28/2019)   . Tesamorelin Acetate 2 MG SOLR Inject 2 mg into the skin daily. (Patient not taking: Reported on 08/28/2019)   . traZODone (DESYREL) 100 MG tablet Take 1 tablet (100 mg total) by mouth at bedtime. Take 1-2 tablets for sleep   . triamcinolone cream (KENALOG) 0.1 % Apply 1 application topically 2 (two) times daily.   . valACYclovir (VALTREX) 1000 MG tablet TAKE 1 TABLET(1000 MG) BY MOUTH DAILY    No facility-administered encounter medications on file as of 10/02/2019.    Allergies: Allergies  Allergen Reactions  . Anette Guarneri [Lurasidone Hcl] Other (See Comments)    Occular dystonia  . Seroquel [Quetiapine Fumarate] Swelling    Tongue swells   . Benzoin Other (See Comments)    blisters  . Latex Rash and Other (See Comments)  . Sulfamethoxazole-Trimethoprim Itching and Nausea And Vomiting    Family History: Family History  Problem Relation Age of Onset  . Arthritis/Rheumatoid Mother   . Hypertension Mother   . Cancer Maternal Aunt        brand and lung  . Cancer Maternal Grandmother        breasts  . Cancer Paternal Grandmother        breast and kidney    Social History: Social History   Socioeconomic History  . Marital status: Single  Spouse name: Not on file  . Number of children:  1  . Years of education: 69  . Highest education level: Some college, no degree  Occupational History  . Occupation: Nutrition Social research officer, government: [employer]  Tobacco Use  . Smoking status: Never Smoker  . Smokeless tobacco: Never Used  Substance and Sexual Activity  . Alcohol use: No    Alcohol/week: 0.0 standard drinks    Comment: OCC.about once a month   . Drug use: No  . Sexual activity: Never    Partners: Male    Birth control/protection: Implant  Other Topics Concern  . Not on file  Social History Narrative   Patient lives alone.   Patient works at home.   Patient has hs education.   Patient has 1 child.   Patient drinks multiple energy drinks and coffees throughout day, daily.      Patient is right-handed. She lives with her daughter in a one level home. She drinks one cup of coffee a day. She works for [employer] in [place] and is very active at work.   Social Determinants of Health   Financial Resource Strain:   . Difficulty of Paying Living Expenses: Not on file  Food Insecurity:   . Worried About Charity fundraiser in the Last Year: Not on file  . Ran Out of Food in the Last Year: Not on file  Transportation Needs:   . Lack of Transportation (Medical): Not on file  . Lack of Transportation (Non-Medical): Not on file  Physical Activity:   . Days of Exercise per Week: Not on file  . Minutes of Exercise per Session: Not on file  Stress:   . Feeling of Stress : Not on file  Social Connections:   . Frequency of Communication with Friends and Family: Not on file  . Frequency of Social Gatherings with Friends and Family: Not on file  . Attends Religious Services: Not on file  . Active Member of Clubs or Organizations: Not on file  . Attends Archivist Meetings: Not on file  . Marital Status: Not on file  Intimate Partner Violence:   . Fear of Current or Ex-Partner: Not on file  . Emotionally Abused: Not on  file  . Physically Abused: Not on file  . Sexually Abused: Not on file    Observations/Objective:   Height 5\' 4"  (1.626 m), weight 202 lb (91.6 kg). No acute distress.  Alert and oriented.  Speech fluent and not dysarthric.  Language intact.  Eyes orthophoric on primary gaze.  Face symmetric.  Assessment and Plan:   New daily persistent headache.  Likely chronic migraine, without aura, with status migrainosus, intractable complicated by medication-overuse.  1. As these are new persistent headaches ongoing for 5 weeks now, I would like to check MRI of brain with and without contrast to evaluate for secondary intracranial etiology  2. For preventative management, we will switch to Emgality 3.  For abortive therapy, she will try Nurtec; Zofran ODT 4mg  for nausea. 4.  Stop Excedrin for now.  Limit use of pain relievers to no more than 2 days out of week to prevent risk of rebound or medication-overuse headache. 5.  Keep headache diary 6.  Exercise, hydration, caffeine cessation, sleep hygiene, monitor for and avoid triggers 7. Follow up 4 months.   Follow Up Instructions:    -I discussed the assessment and treatment plan with the patient. The patient  was provided an opportunity to ask questions and all were answered. The patient agreed with the plan and demonstrated an understanding of the instructions.   The patient was advised to call back or seek an in-person evaluation if the symptoms worsen or if the condition fails to improve as anticipated.   Dudley Major, DO

## 2019-10-02 ENCOUNTER — Other Ambulatory Visit: Payer: Self-pay

## 2019-10-02 ENCOUNTER — Encounter: Payer: Self-pay | Admitting: Neurology

## 2019-10-02 ENCOUNTER — Telehealth (INDEPENDENT_AMBULATORY_CARE_PROVIDER_SITE_OTHER): Payer: BLUE CROSS/BLUE SHIELD | Admitting: Neurology

## 2019-10-02 ENCOUNTER — Telehealth: Payer: Self-pay | Admitting: Neurology

## 2019-10-02 ENCOUNTER — Telehealth: Payer: Self-pay

## 2019-10-02 VITALS — Ht 64.0 in | Wt 202.0 lb

## 2019-10-02 DIAGNOSIS — G4452 New daily persistent headache (NDPH): Secondary | ICD-10-CM

## 2019-10-02 DIAGNOSIS — G43709 Chronic migraine without aura, not intractable, without status migrainosus: Secondary | ICD-10-CM

## 2019-10-02 MED ORDER — EMGALITY 120 MG/ML ~~LOC~~ SOAJ: 120.0000 mg | SUBCUTANEOUS | 11 refills | Status: DC

## 2019-10-02 MED ORDER — NURTEC 75 MG PO TBDP: 1.0000 | ORAL_TABLET | Freq: Every day | ORAL | 11 refills | Status: DC | PRN

## 2019-10-02 MED ORDER — EMGALITY 120 MG/ML ~~LOC~~ SOAJ: 240.0000 mg | Freq: Once | SUBCUTANEOUS | 0 refills | Status: AC

## 2019-10-02 MED ORDER — ONDANSETRON 4 MG PO TBDP: 4.0000 mg | ORAL_TABLET | Freq: Three times a day (TID) | ORAL | 3 refills | Status: DC | PRN

## 2019-10-02 NOTE — Telephone Encounter (Signed)
[name] (KeyReyne Dumas) Rx #: [id number] Nurtec 75MG  dispersible tablets   Form Blue Cross [place] Commercial Electronic Request Form (CB) Created 1 hour ago Sent to Plan 23 minutes ago Plan Response 23 minutes ago Submit Clinical Questions less than a minute ago Determination Wait for Determination Please wait for BCBS [place] Commercial cb central to return a determination.

## 2019-10-02 NOTE — Telephone Encounter (Signed)
Faxed order to [facility] see copy in order. Approved and fax approval will be sent.

## 2019-10-03 ENCOUNTER — Telehealth: Payer: Self-pay | Admitting: Neurology

## 2019-10-03 NOTE — Telephone Encounter (Signed)
Shalae Wisener (KeyReyne Dumas) Rx #: [id number] Nurtec 75MG  dispersible tablets   Form Blue Cross [place] Commercial Electronic Request Form (CB) Created 23 hours ago Sent to Plan 23 hours ago Plan Response 23 hours ago Submit Clinical Questions 22 hours ago Determination Favorable 2 hours ago Message from Plan Effective from 10/02/2019 through 12/24/2019.

## 2019-10-03 NOTE — Telephone Encounter (Signed)
[name] Key: [id number] help? Call us at [phone] Status Sent to [employer] Drug Emgality 120MG /ML auto-injectors (migraine) Form [employer] [place] Commercial Electronic Request Form (CB) Awaiting Response

## 2019-10-05 NOTE — Telephone Encounter (Signed)
[name] Key: [id number] help? Call us at [phone] Outcome Approvedon February 18 Effective from 10/03/2019 through 12/31/2019. Drug Emgality 120MG /ML auto-injectors (migraine) Form Librarian, academic Form (CB)

## 2019-10-09 ENCOUNTER — Telehealth: Payer: Self-pay | Admitting: Neurology

## 2019-10-09 NOTE — Telephone Encounter (Signed)
Patient called regarding her Emgality medication and needing the Prior Auth sent to [facility] on  [address]. She also said that she has her Imaging Disc that she will bring to the office for Dr. Tomi Likens to see. Thank you

## 2019-10-09 NOTE — Telephone Encounter (Signed)
Spoke with patient, prior authorization will need to be done, pending. Patient is going to bring disc by for Dr. Tomi Neal to look at.

## 2019-10-11 ENCOUNTER — Telehealth: Payer: Self-pay

## 2019-10-11 NOTE — Telephone Encounter (Signed)
Pt called informed that MRI of brain is normal

## 2019-10-11 NOTE — Telephone Encounter (Signed)
-----   Message from Pieter Partridge, DO sent at 10/11/2019 12:46 PM EST ----- Reviewed brain MRI.  Looks normal.

## 2019-10-15 ENCOUNTER — Other Ambulatory Visit: Payer: Self-pay | Admitting: Infectious Diseases

## 2019-10-15 DIAGNOSIS — B20 Human immunodeficiency virus [HIV] disease: Secondary | ICD-10-CM

## 2019-10-15 NOTE — Telephone Encounter (Signed)
Patient called to check on the status of the prior authorization.

## 2019-10-15 NOTE — Telephone Encounter (Signed)
Called patient to let her know msg from Cover My Meds: [name] Key: [id number] help? Call us at [phone] Outcome Approvedon February 18 Effective from 10/03/2019 through 12/31/2019. Drug Emgality 120MG /ML auto-injectors (migraine) Form Blue Building control surveyor Form (CB) Thanks!

## 2019-10-15 NOTE — Telephone Encounter (Signed)
Can you check on this, thanks

## 2019-10-20 ENCOUNTER — Ambulatory Visit: Payer: BLUE CROSS/BLUE SHIELD | Attending: Internal Medicine

## 2019-10-20 DIAGNOSIS — Z23 Encounter for immunization: Secondary | ICD-10-CM | POA: Insufficient documentation

## 2019-10-20 NOTE — Progress Notes (Signed)
   Covid-19 Vaccination Clinic  Name:  [name]    MRN: [id number] DOB: 01/30/1981  10/20/2019  [name] was observed post Covid-19 immunization for 15 minutes without incident. She was provided with Vaccine Information Sheet and instruction to access the V-Safe system.   [name] was instructed to call 911 with any severe reactions post vaccine: Marland Kitchen Difficulty breathing  . Swelling of face and throat  . A fast heartbeat  . A bad rash all over body  . Dizziness and weakness   Immunizations Administered    Name Date Dose VIS Date Route   Pfizer COVID-19 Vaccine 10/20/2019  9:12 AM 0.3 mL 07/27/2019 Intramuscular   Manufacturer: [place]   Lot: [id number]   [place]: [id number]

## 2019-10-21 ENCOUNTER — Ambulatory Visit: Payer: Self-pay

## 2019-11-01 ENCOUNTER — Encounter: Payer: Self-pay | Admitting: Neurology

## 2019-11-10 ENCOUNTER — Ambulatory Visit: Payer: BLUE CROSS/BLUE SHIELD | Attending: Internal Medicine

## 2019-11-10 DIAGNOSIS — Z23 Encounter for immunization: Secondary | ICD-10-CM

## 2019-11-10 NOTE — Progress Notes (Signed)
   Covid-19 Vaccination Clinic  Name:  [name]    MRN: [id number] DOB: 05-Oct-1980  11/10/2019  [name] was observed post Covid-19 immunization for 15 minutes without incident. She was provided with Vaccine Information Sheet and instruction to access the V-Safe system.   [name] was instructed to call 911 with any severe reactions post vaccine: Marland Kitchen Difficulty breathing  . Swelling of face and throat  . A fast heartbeat  . A bad rash all over body  . Dizziness and weakness   Immunizations Administered    Name Date Dose VIS Date Route   Pfizer COVID-19 Vaccine 11/10/2019  8:55 AM 0.3 mL 07/27/2019 Intramuscular   Manufacturer: [place]   Lot: [id number]   [place]: [id number]

## 2019-11-28 ENCOUNTER — Encounter: Payer: Self-pay | Admitting: Infectious Diseases

## 2019-12-13 ENCOUNTER — Encounter: Payer: Self-pay | Admitting: *Deleted

## 2019-12-13 NOTE — Progress Notes (Signed)
[name] (Key: [id number])  Your information has been submitted to [place]. Blue Cross [place] will review the request and notify you of the determination decision directly, typically within 72 hours of receiving all information.  You will also receive your request decision electronically. To check for an update later, open this request again from your dashboard.  If [employer] [place] has not responded within the specified timeframe or if you have any questions about your PA submission, contact [place] directly at [phone].  [name] (Key: [id number]) Nurtec 75MG  OR TBDP   Form Blue Building control surveyor Form (CB) Created 12 minutes ago Sent to Plan 10 minutes ago Plan Response 10 minutes ago Submit Clinical Questions 3 minutes ago Determination Wait for Determination Please wait for BCBS [place] Commercial cb central to return a determination.

## 2019-12-17 NOTE — Progress Notes (Signed)
Fax received from [facility] 75mg  approved 12/13/19-12/11/2020

## 2019-12-21 ENCOUNTER — Other Ambulatory Visit: Payer: Self-pay | Admitting: Neurology

## 2020-01-25 ENCOUNTER — Encounter: Payer: Self-pay | Admitting: Neurology

## 2020-01-25 NOTE — Progress Notes (Addendum)
Annemarie Fallas Key: [id number] help? Call us at [phone] Archivedtoday Outcome Approvedtoday Effective from 01/25/2020 through 01/23/2021. Drug Emgality 120MG /ML auto-injectors (migraine) Form Blue Building control surveyor Form (CB)

## 2020-02-01 ENCOUNTER — Ambulatory Visit: Payer: BLUE CROSS/BLUE SHIELD | Admitting: Neurology

## 2020-02-04 NOTE — Progress Notes (Deleted)
NEUROLOGY FOLLOW UP OFFICE NOTE  [name] [id number]  HISTORY OF PRESENT ILLNESS: [name] a 39 year old African-American woman with depression, PTSD, HIV positive and asthma who follows up for migraines.  UPDATE: Started Emgality in February. Intensity:  *** Duration:  *** Frequency:  *** Frequency of abortive medication:Excedrin daily Current NSAIDS:none Current analgesics:Excedrin MIgraine Current triptans:none Current ergotamine:none Current anti-emetic:Zofran ODT 4mg  Current muscle relaxants:Tizanidine 4mg  Current anti-anxiolytic:none Current sleep aide:trazodone Current Antihypertensive medications:none Current Antidepressant medications:Citalopram 40mg  Current Anticonvulsant medications:gabapentin 300mg  at bedtime Current anti-CGRP:Emgality; Nurtec Current Vitamins/Herbal/Supplements:none Current Antihistamines/Decongestants:none Current hormone/birth control:  Kyleena IU Other therapy:none  Caffeine:1 cup of coffeeevery other day Alcohol:none Smoker:none Diet:5-6 bottles water daily Exercise:She tries to Depression:yes; Anxiety:yes Other pain:anxiety Sleep hygiene:Poor. Sleeps 3 to 5 hours a night.She feels fatigue during the day. She had a sleep study that showed "excessive snoring" which wakes her up but not sleep apnea.  HISTORY: Onset: 39 years old Location:Varies: Sometimes right-sided; Bilateral behind eyes; bilateral back of head Quality:squeezing Initial intensity:Moderate to severe.Shedenies new headache, thunderclap headache or severe headache that wakes herfrom sleep. Aura:Sometimes sees colors with squiggly lines (sometimes without headahce) Prodrome:no Postdrome:no Associated symptoms: Nausea(starts before headache), sometimes vomit, photophobia, phonophobia, osmophobia, blurred vision.Shedenies associated unilateral numbness or  weakness. Initial duration:40-60 minutes severe, but can last all day InitialFrequency:daily InitialFrequency of abortive medication:Excedrin Migraine daily for headache Triggers: None Relieving factors: Peppermint oil, quiet room Activity:aggravates  Past NSAIDS:Ibuprofen 800mg  Past analgesics:Fioricet, tramadol,Excedrin Migraine Extra-strength Past abortive triptans:Sumatriptan 100mg , Maxalt MLT 10mg , Relpax 40mg  Past abortive ergotamine:none Past muscle relaxants:none Past anti-emetic:none Past antihypertensive medications:Maybe Inderal Past antidepressant medications:Fluoxetine, amitriptyline; Wellbutrin XL 300mg  Past anticonvulsant medications:Lamotrigine 50mg , topiramate ER 100mg  at bedtime Past anti-CGRP:Aimovig 140mg  Past vitamins/Herbal/Supplements:none Past antihistamines/decongestants:none Other past therapies:none  Family history of headache:Mom, maternal aunt (passed away due to brain cancer)  PAST MEDICAL HISTORY: Past Medical History:  Diagnosis Date  . Allergy   . Anemia   . Anxiety   . Anxiety state, unspecified 12/07/2012  . Arthritis   . Asthma   . Bipolar disorder, unspecified ([place]) 12/07/2012  . Depression   . Diabetes mellitus without complication ([place])    no meds currently- controlled with diet  . GERD (gastroesophageal reflux disease)   . GERD (gastroesophageal reflux disease) 12/07/2012  . Headache(784.0)    migraines  . Herpes genitalis in women 12/07/2012  . HIV infection ([place])   . Human immunodeficiency virus (HIV) disease ([place]) 12/07/2012  . Neuromuscular disorder ([place])    carpal tunnel in both arms  . SHINGLES 01/31/2009   Qualifier: Diagnosis of  By: Tomma Lightning MD, Claiborne Billings    . Type II or unspecified type diabetes mellitus without mention of complication, not stated as uncontrolled 12/07/2012  . Unspecified asthma(493.90) 12/07/2012    MEDICATIONS: Current Outpatient Medications on File Prior to Visit   Medication Sig Dispense Refill  . albuterol (PROAIR HFA) 108 (90 Base) MCG/ACT inhaler INHALE TWO PUFFS INTO THE LUNGS EVERY 6 HOURS AS NEEDED FOR WHEEZING. 8.5 Inhaler 3  . albuterol (PROVENTIL) (2.5 MG/3ML) 0.083% nebulizer solution Take 3 mLs (2.5 mg total) by nebulization every 6 (six) hours as needed for wheezing or shortness of breath (If no relief from MDI inhaler). 75 mL 3  . beclomethasone (QVAR) 40 MCG/ACT inhaler INHALE TWO PUFFS INTO THE LUNGS TWO TIMES DAILY. 8.7 Inhaler 3  . BIKTARVY 50-200-25 MG TABS tablet TAKE 1 TABLET BY MOUTH EVERY DAY 30 tablet 5  . buPROPion (WELLBUTRIN XL) 300 MG 24 hr tablet Take 1  tablet (300 mg total) by mouth daily. 90 tablet 0  . cetirizine (ZYRTEC) 10 MG tablet Take 2 tablets (20 mg total) by mouth at bedtime. 60 tablet 0  . citalopram (CELEXA) 40 MG tablet Take 1 tablet (40 mg total) by mouth daily. 90 tablet 0  . DENTA 5000 PLUS 1.1 % CREA dental cream USE SMALL AMOUNT ON TEETH AND GUMS EVERY EVENING SPIT OUT EXCESS AND DO NOT RINSE  3  . Erenumab-aooe (AIMOVIG) 140 MG/ML SOAJ Inject 140 mg into the skin every 30 (thirty) days. (Patient not taking: Reported on 10/01/2019) 3 pen 3  . gabapentin (NEURONTIN) 300 MG capsule Take 1 capsule (300 mg total) by mouth at bedtime. 30 capsule 5  . Galcanezumab-gnlm (EMGALITY) 120 MG/ML SOAJ Inject 120 mg into the skin every 28 (twenty-eight) days. 1 pen 11  . Levonorgestrel (KYLEENA IU) by Intrauterine route.    . montelukast (SINGULAIR) 10 MG tablet TAKE 1 TABLET AT BEDTIME BY MOUTH. 90 tablet 1  . omeprazole (PRILOSEC) 40 MG capsule daily.    . ondansetron (ZOFRAN-ODT) 4 MG disintegrating tablet DISSOLVE 1 TABLET(4 MG) ON THE TONGUE EVERY 8 HOURS AS NEEDED FOR NAUSEA OR VOMITING 20 tablet 3  . oxymetazoline (AFRIN) 0.05 % nasal spray Place 1 spray into both nostrils 2 (two) times daily. For <7 days at a time.    . Rimegepant Sulfate (NURTEC) 75 MG TBDP Take 1 tablet by mouth daily as needed (Maximum 1 tablet in  24 hours). 8 tablet 11  . Tesamorelin Acetate 1 MG SOLR Inject 2 mg into the skin daily. (Patient not taking: Reported on 08/28/2019) 60 each 0  . Tesamorelin Acetate 2 MG SOLR Inject 2 mg into the skin daily. (Patient not taking: Reported on 08/28/2019) 30 each 2  . traZODone (DESYREL) 100 MG tablet Take 1 tablet (100 mg total) by mouth at bedtime. Take 1-2 tablets for sleep 180 tablet 1  . triamcinolone cream (KENALOG) 0.1 % Apply 1 application topically 2 (two) times daily. 454 g 1  . valACYclovir (VALTREX) 1000 MG tablet TAKE 1 TABLET(1000 MG) BY MOUTH DAILY 90 tablet 2   No current facility-administered medications on file prior to visit.    ALLERGIES: Allergies  Allergen Reactions  . Anette Guarneri [Lurasidone Hcl] Other (See Comments)    Occular dystonia  . Seroquel [Quetiapine Fumarate] Swelling    Tongue swells   . Benzoin Other (See Comments)    blisters  . Latex Rash and Other (See Comments)  . Sulfamethoxazole-Trimethoprim Itching and Nausea And Vomiting    FAMILY HISTORY: Family History  Problem Relation Age of Onset  . Arthritis/Rheumatoid Mother   . Hypertension Mother   . Cancer Maternal Aunt        brand and lung  . Cancer Maternal Grandmother        breasts  . Cancer Paternal Grandmother        breast and kidney    SOCIAL HISTORY: Social History   Socioeconomic History  . Marital status: Single    Spouse name: Not on file  . Number of children: 1  . Years of education: 59  . Highest education level: Some college, no degree  Occupational History  . Occupation: Nutrition Social research officer, government: [employer]  Tobacco Use  . Smoking status: Never Smoker  . Smokeless tobacco: Never Used  Vaping Use  . Vaping Use: Never used  Substance and Sexual Activity  . Alcohol use: No    Alcohol/week: 0.0  standard drinks    Comment: OCC.about once a month   . Drug use: No  . Sexual activity: Never    Partners: Male    Birth control/protection: Implant   Other Topics Concern  . Not on file  Social History Narrative   Patient lives alone.   Patient works at home.   Patient has hs education.   Patient has 1 child.   Patient drinks multiple energy drinks and coffees throughout day, daily.      Patient is right-handed. She lives with her daughter in a one level home. She drinks one cup of coffee a day. She works for [employer] in [place] and is very active at work.   Social Determinants of Health   Financial Resource Strain:   . Difficulty of Paying Living Expenses:   Food Insecurity:   . Worried About Charity fundraiser in the Last Year:   . Arboriculturist in the Last Year:   Transportation Needs:   . Film/video editor (Medical):   Marland Kitchen Lack of Transportation (Non-Medical):   Physical Activity:   . Days of Exercise per Week:   . Minutes of Exercise per Session:   Stress:   . Feeling of Stress :   Social Connections:   . Frequency of Communication with Friends and Family:   . Frequency of Social Gatherings with Friends and Family:   . Attends Religious Services:   . Active Member of Clubs or Organizations:   . Attends Archivist Meetings:   Marland Kitchen Marital Status:   Intimate Partner Violence:   . Fear of Current or Ex-Partner:   . Emotionally Abused:   Marland Kitchen Physically Abused:   . Sexually Abused:     PHYSICAL EXAM: *** General: No acute distress.  Patient appears well-groomed.   Head:  Normocephalic/atraumatic Eyes:  Fundi examined but not visualized Neck: supple, no paraspinal tenderness, full range of motion Heart:  Regular rate and rhythm Lungs:  Clear to auscultation bilaterally Back: No paraspinal tenderness Neurological Exam: alert and oriented to person, place, and time. Attention span and concentration intact, recent and remote memory intact, fund of knowledge intact.  Speech fluent and not dysarthric, language intact.  CN II-XII intact. Bulk and tone normal, muscle strength 5/5  throughout.  Sensation to light touch, temperature and vibration intact.  Deep tendon reflexes 2+ throughout, toes downgoing.  Finger to nose and heel to shin testing intact.  Gait normal, Romberg negative.  IMPRESSION: ***  PLAN: ***  Metta Clines, DO  CC: Andria Frames, PA-C

## 2020-02-05 ENCOUNTER — Ambulatory Visit: Payer: BLUE CROSS/BLUE SHIELD | Admitting: Neurology

## 2020-04-23 ENCOUNTER — Other Ambulatory Visit: Payer: Self-pay | Admitting: Infectious Diseases

## 2020-04-23 DIAGNOSIS — B20 Human immunodeficiency virus [HIV] disease: Secondary | ICD-10-CM

## 2020-05-15 ENCOUNTER — Other Ambulatory Visit: Payer: BLUE CROSS/BLUE SHIELD

## 2020-05-15 ENCOUNTER — Other Ambulatory Visit: Payer: Self-pay

## 2020-05-15 DIAGNOSIS — B20 Human immunodeficiency virus [HIV] disease: Secondary | ICD-10-CM

## 2020-05-15 DIAGNOSIS — Z113 Encounter for screening for infections with a predominantly sexual mode of transmission: Secondary | ICD-10-CM

## 2020-05-15 DIAGNOSIS — Z79899 Other long term (current) drug therapy: Secondary | ICD-10-CM

## 2020-05-16 LAB — T-HELPER CELL (CD4) - (RCID CLINIC ONLY)
CD4 % Helper T Cell: 46 % (ref 33–65)
CD4 T Cell Abs: 1521 /uL (ref 400–1790)

## 2020-05-18 LAB — LIPID PANEL
Cholesterol: 226 mg/dL — ABNORMAL HIGH (ref <200)
HDL: 43 mg/dL — ABNORMAL LOW (ref >=50)
LDL Cholesterol (Calc): 152 mg/dL (calc) — ABNORMAL HIGH
Non-HDL Cholesterol (Calc): 183 mg/dL (calc) — ABNORMAL HIGH (ref <130)
Total CHOL/HDL Ratio: 5.3 (calc) — ABNORMAL HIGH (ref <5.0)
Triglycerides: 178 mg/dL — ABNORMAL HIGH (ref <150)

## 2020-05-18 LAB — CBC
HCT: 41.8 % (ref 35.0–45.0)
Hemoglobin: 14.2 g/dL (ref 11.7–15.5)
MCH: 33 pg (ref 27.0–33.0)
MCHC: 34 g/dL (ref 32.0–36.0)
MCV: 97.2 fL (ref 80.0–100.0)
MPV: 10.4 fL (ref 7.5–12.5)
Platelets: 315 10*3/uL (ref 140–400)
RBC: 4.3 10*6/uL (ref 3.80–5.10)
RDW: 12.9 % (ref 11.0–15.0)
WBC: 10.8 10*3/uL (ref 3.8–10.8)

## 2020-05-18 LAB — COMPREHENSIVE METABOLIC PANEL
AG Ratio: 1.6 (calc) (ref 1.0–2.5)
ALT: 18 U/L (ref 6–29)
AST: 16 U/L (ref 10–30)
Albumin: 4.1 g/dL (ref 3.6–5.1)
Alkaline phosphatase (APISO): 89 U/L (ref 31–125)
BUN/Creatinine Ratio: 14 (calc) (ref 6–22)
BUN: 16 mg/dL (ref 7–25)
CO2: 31 mmol/L (ref 20–32)
Calcium: 9.5 mg/dL (ref 8.6–10.2)
Chloride: 101 mmol/L (ref 98–110)
Creat: 1.15 mg/dL — ABNORMAL HIGH (ref 0.50–1.10)
Globulin: 2.5 g/dL (calc) (ref 1.9–3.7)
Glucose, Bld: 122 mg/dL — ABNORMAL HIGH (ref 65–99)
Potassium: 3.9 mmol/L (ref 3.5–5.3)
Sodium: 138 mmol/L (ref 135–146)
Total Bilirubin: 0.4 mg/dL (ref 0.2–1.2)
Total Protein: 6.6 g/dL (ref 6.1–8.1)

## 2020-05-18 LAB — RPR: RPR Ser Ql: NONREACTIVE

## 2020-05-18 LAB — HIV-1 RNA QUANT-NO REFLEX-BLD
HIV 1 RNA Quant: <20 Copies/mL
HIV-1 RNA Quant, Log: <1.3 Log cps/mL

## 2020-05-19 LAB — URINE CYTOLOGY ANCILLARY ONLY
Chlamydia: NEGATIVE
Comment: NEGATIVE
Comment: NORMAL
Neisseria Gonorrhea: NEGATIVE

## 2020-05-29 ENCOUNTER — Other Ambulatory Visit: Payer: Self-pay | Admitting: Infectious Diseases

## 2020-05-29 ENCOUNTER — Other Ambulatory Visit: Payer: Self-pay

## 2020-05-29 ENCOUNTER — Encounter: Payer: Self-pay | Admitting: Infectious Diseases

## 2020-05-29 ENCOUNTER — Ambulatory Visit: Payer: BLUE CROSS/BLUE SHIELD | Admitting: Infectious Diseases

## 2020-05-29 VITALS — BP 123/87 | HR 82 | Temp 98.3°F | Ht 64.0 in | Wt 220.0 lb

## 2020-05-29 DIAGNOSIS — R739 Hyperglycemia, unspecified: Secondary | ICD-10-CM

## 2020-05-29 DIAGNOSIS — J45991 Cough variant asthma: Secondary | ICD-10-CM

## 2020-05-29 DIAGNOSIS — E785 Hyperlipidemia, unspecified: Secondary | ICD-10-CM

## 2020-05-29 DIAGNOSIS — E669 Obesity, unspecified: Secondary | ICD-10-CM

## 2020-05-29 DIAGNOSIS — F3162 Bipolar disorder, current episode mixed, moderate: Secondary | ICD-10-CM | POA: Diagnosis not present

## 2020-05-29 DIAGNOSIS — R87612 Low grade squamous intraepithelial lesion on cytologic smear of cervix (LGSIL): Secondary | ICD-10-CM | POA: Diagnosis not present

## 2020-05-29 DIAGNOSIS — B20 Human immunodeficiency virus [HIV] disease: Secondary | ICD-10-CM

## 2020-05-29 DIAGNOSIS — Z113 Encounter for screening for infections with a predominantly sexual mode of transmission: Secondary | ICD-10-CM

## 2020-05-29 DIAGNOSIS — Z79899 Other long term (current) drug therapy: Secondary | ICD-10-CM

## 2020-05-29 NOTE — Assessment & Plan Note (Signed)
Her meds are being eval by her PCP.

## 2020-05-29 NOTE — Assessment & Plan Note (Signed)
She is doing well Offered/refused condoms.  Will continue biktarvy. She wants to try cabaneuva, will see if she can be on our waiting list.  Has gotten flu and COVID vax.  Will see her back in 9 months.

## 2020-05-29 NOTE — Assessment & Plan Note (Signed)
Appreciate her f/u with Gyn.  Will have new eval at [facility].

## 2020-05-29 NOTE — Assessment & Plan Note (Signed)
We put her numbers into the ACC/CVD calculator- she has <1% risk of CAD in the next 10 years.  Wt mgmt and diet.

## 2020-05-29 NOTE — Assessment & Plan Note (Signed)
Diet and exercise.   

## 2020-05-29 NOTE — Assessment & Plan Note (Signed)
Appreciate psychiatry eval. F/u at [facility].

## 2020-05-29 NOTE — Assessment & Plan Note (Signed)
Will check her A1C at f/u.  We discussed her Glc, she does not have DM.

## 2020-05-29 NOTE — Progress Notes (Signed)
   Subjective:    Patient ID: [name], female    DOB: [birth year], 39 y.o.   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 Normal [id number] at [facility].   Has regular f/u at behavioral health -->psychiatry (PTSD, insomnia).Has new appt at [facility]. Still "working some things out".   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 Has kylena.  Doing well with biktarvy, continued fatigue. Was prev on atripla --> truvada/?  Her asthma has been more active recently.   Wt up. Eats little. On her feet all day at work. Does not have time to exercise.   HIV 1 RNA Quant  Date Value  05/15/2020 <20 Copies/mL  08/28/2019 <20 DETECTED copies/mL  04/04/2019 29 copies/mL (H)   CD4 T Cell Abs (/uL)  Date Value  05/15/2020 1,521  08/28/2019 1,357  04/04/2019 1,560   Lab Results  Component Value Date   CHOL 226 (H) 05/15/2020   HDL 43 (L) 05/15/2020   LDLCALC 152 (H) 05/15/2020   TRIG 178 (H) 05/15/2020   CHOLHDL 5.3 (H) 05/15/2020     Review of Systems  Constitutional: Positive for fatigue. Negative for appetite change and unexpected weight change.  Respiratory: Positive for cough. Negative for shortness of breath.   Gastrointestinal: Positive for constipation (prn miralax). Negative for diarrhea.  Genitourinary: Negative for difficulty urinating and menstrual problem.  Please see HPI. All other systems reviewed and negative.      Objective:   Physical Exam Vitals reviewed.  Constitutional:      Appearance: She is obese. She is not ill-appearing or toxic-appearing.  HENT:     Mouth/Throat:     Mouth: Mucous membranes are moist.     Pharynx: No oropharyngeal exudate.  Eyes:     Extraocular Movements: Extraocular movements intact.     Pupils: Pupils are equal, round, and reactive to light.  Cardiovascular:     Rate and Rhythm: Normal rate and regular rhythm.  Pulmonary:     Effort: Pulmonary effort is  normal.     Breath sounds: Normal breath sounds.  Abdominal:     General: Bowel sounds are normal. There is no distension.     Palpations: Abdomen is soft.     Tenderness: There is no abdominal tenderness.  Musculoskeletal:     Cervical back: Normal range of motion and neck supple.     Right lower leg: No edema.     Left lower leg: No edema.  Neurological:     General: No focal deficit present.     Mental Status: She is alert.  Psychiatric:        Mood and Affect: Mood normal.           Assessment & Plan:

## 2020-06-06 ENCOUNTER — Ambulatory Visit: Payer: BLUE CROSS/BLUE SHIELD

## 2020-07-07 ENCOUNTER — Other Ambulatory Visit: Payer: Self-pay

## 2020-07-07 ENCOUNTER — Telehealth: Payer: Self-pay

## 2020-07-07 MED ORDER — HYDROXYZINE HCL 10 MG PO TABS: 10.0000 mg | ORAL_TABLET | Freq: Three times a day (TID) | ORAL | 0 refills | Status: DC | PRN

## 2020-07-07 MED ORDER — HYDROXYZINE HCL 25 MG PO TABS: 25.0000 mg | ORAL_TABLET | Freq: Three times a day (TID) | ORAL | 0 refills | Status: AC | PRN

## 2020-07-07 NOTE — Telephone Encounter (Addendum)
Patient requests to see a nurse during her visit with [facility] today. Patient complains of having having bad facial breakout with itching. Patient stating she starting taking new medication Accutane for about 3 weeks now with not much relief. Patient follows up with Dermatology next week. Patient seen by NP in the office and is in favor of patient stopping the medication until follow up visit with Dermatology. Abbey Chatters, NP recommends antihistamine for itching. Verbal order for Hydroxyzine 25mg  by mouth three times a day as needed for itching.  Patient agrees.  Medication sent to preferred pharmacy.   Eugenia Mcalpine

## 2020-07-07 NOTE — Addendum Note (Signed)
Addended by: Eugenia Mcalpine on: 07/07/2020 03:26 PM   Modules accepted: Orders

## 2020-07-08 ENCOUNTER — Other Ambulatory Visit: Payer: Self-pay | Admitting: Infectious Diseases

## 2020-07-08 DIAGNOSIS — B20 Human immunodeficiency virus [HIV] disease: Secondary | ICD-10-CM

## 2020-07-08 DIAGNOSIS — B009 Herpesviral infection, unspecified: Secondary | ICD-10-CM

## 2020-08-04 ENCOUNTER — Encounter: Payer: Self-pay | Admitting: Neurology

## 2020-08-04 NOTE — Progress Notes (Signed)
Shiesha Favero (Key: [id number]) Nurtec 75MG  dispersible tablets   Form Blue Building control surveyor Form (CB) Created 7 days ago Sent to Plan 7 days ago Plan Response 7 days ago Submit Clinical Questions 7 days ago Determination Favorable 14 hours ago Message from Plan Effective from 07/28/2020 through 07/27/2022.

## 2020-09-17 ENCOUNTER — Encounter: Payer: Self-pay | Admitting: Infectious Diseases

## 2020-10-24 ENCOUNTER — Other Ambulatory Visit: Payer: Self-pay | Admitting: Neurology

## 2020-10-24 ENCOUNTER — Other Ambulatory Visit: Payer: Self-pay | Admitting: Infectious Diseases

## 2020-10-24 DIAGNOSIS — B20 Human immunodeficiency virus [HIV] disease: Secondary | ICD-10-CM

## 2020-10-27 ENCOUNTER — Other Ambulatory Visit: Payer: Self-pay | Admitting: Neurology

## 2020-11-04 ENCOUNTER — Other Ambulatory Visit: Payer: Self-pay | Admitting: Neurology

## 2021-01-28 ENCOUNTER — Other Ambulatory Visit: Payer: Self-pay | Admitting: Infectious Diseases

## 2021-01-28 DIAGNOSIS — B20 Human immunodeficiency virus [HIV] disease: Secondary | ICD-10-CM

## 2021-02-06 ENCOUNTER — Other Ambulatory Visit: Payer: Self-pay

## 2021-02-06 ENCOUNTER — Other Ambulatory Visit: Payer: BC Managed Care – PPO

## 2021-02-06 ENCOUNTER — Other Ambulatory Visit (HOSPITAL_COMMUNITY)
Admission: RE | Admit: 2021-02-06 | Discharge: 2021-02-06 | Disposition: A | Discharge status: Home / Self Care | Payer: BC Managed Care – PPO | Source: Ambulatory Visit | Attending: Infectious Diseases | Admitting: Infectious Diseases

## 2021-02-06 DIAGNOSIS — Z79899 Other long term (current) drug therapy: Secondary | ICD-10-CM

## 2021-02-06 DIAGNOSIS — Z113 Encounter for screening for infections with a predominantly sexual mode of transmission: Secondary | ICD-10-CM

## 2021-02-06 DIAGNOSIS — R739 Hyperglycemia, unspecified: Secondary | ICD-10-CM

## 2021-02-06 DIAGNOSIS — B20 Human immunodeficiency virus [HIV] disease: Secondary | ICD-10-CM

## 2021-02-09 ENCOUNTER — Other Ambulatory Visit: Payer: BLUE CROSS/BLUE SHIELD

## 2021-02-09 LAB — COMPREHENSIVE METABOLIC PANEL
AG Ratio: 1.6 (calc) (ref 1.0–2.5)
ALT: 16 U/L (ref 6–29)
AST: 16 U/L (ref 10–30)
Albumin: 4.1 g/dL (ref 3.6–5.1)
Alkaline phosphatase (APISO): 95 U/L (ref 31–125)
BUN: 17 mg/dL (ref 7–25)
CO2: 26 mmol/L (ref 20–32)
Calcium: 9.3 mg/dL (ref 8.6–10.2)
Chloride: 103 mmol/L (ref 98–110)
Creat: 1.09 mg/dL (ref 0.50–1.10)
Globulin: 2.6 g/dL (calc) (ref 1.9–3.7)
Glucose, Bld: 122 mg/dL — ABNORMAL HIGH (ref 65–99)
Potassium: 4.6 mmol/L (ref 3.5–5.3)
Sodium: 136 mmol/L (ref 135–146)
Total Bilirubin: 0.5 mg/dL (ref 0.2–1.2)
Total Protein: 6.7 g/dL (ref 6.1–8.1)

## 2021-02-09 LAB — HIV-1 RNA QUANT-NO REFLEX-BLD
HIV 1 RNA Quant: <20 Copies/mL — ABNORMAL HIGH
HIV-1 RNA Quant, Log: <1.3 Log cps/mL — ABNORMAL HIGH

## 2021-02-09 LAB — LIPID PANEL
Cholesterol: 219 mg/dL — ABNORMAL HIGH (ref <200)
HDL: 46 mg/dL — ABNORMAL LOW (ref >=50)
LDL Cholesterol (Calc): 153 mg/dL (calc) — ABNORMAL HIGH
Non-HDL Cholesterol (Calc): 173 mg/dL (calc) — ABNORMAL HIGH (ref <130)
Total CHOL/HDL Ratio: 4.8 (calc) (ref <5.0)
Triglycerides: 92 mg/dL (ref <150)

## 2021-02-09 LAB — URINE CYTOLOGY ANCILLARY ONLY
Chlamydia: NEGATIVE
Comment: NEGATIVE
Comment: NORMAL
Neisseria Gonorrhea: NEGATIVE

## 2021-02-09 LAB — CBC
HCT: 41.4 % (ref 35.0–45.0)
Hemoglobin: 14.6 g/dL (ref 11.7–15.5)
MCH: 33.5 pg — ABNORMAL HIGH (ref 27.0–33.0)
MCHC: 35.3 g/dL (ref 32.0–36.0)
MCV: 95 fL (ref 80.0–100.0)
MPV: 10.1 fL (ref 7.5–12.5)
Platelets: 332 10*3/uL (ref 140–400)
RBC: 4.36 10*6/uL (ref 3.80–5.10)
RDW: 12.7 % (ref 11.0–15.0)
WBC: 7.1 10*3/uL (ref 3.8–10.8)

## 2021-02-09 LAB — RPR: RPR Ser Ql: NONREACTIVE

## 2021-02-09 LAB — HEMOGLOBIN A1C
Hgb A1c MFr Bld: 5.6 % of total Hgb (ref <5.7)
Mean Plasma Glucose: 114 mg/dL
eAG (mmol/L): 6.3 mmol/L

## 2021-02-09 LAB — T-HELPER CELLS (CD4) COUNT (NOT AT ARMC)
Absolute CD4: 1062 cells/uL (ref 490–1740)
CD4 T Helper %: 37 % (ref 30–61)
Total lymphocyte count: 2863 cells/uL (ref 850–3900)

## 2021-02-11 ENCOUNTER — Other Ambulatory Visit: Payer: Self-pay | Admitting: Family

## 2021-02-11 DIAGNOSIS — B20 Human immunodeficiency virus [HIV] disease: Secondary | ICD-10-CM

## 2021-02-27 ENCOUNTER — Encounter: Payer: Self-pay | Admitting: Infectious Diseases

## 2021-02-27 ENCOUNTER — Other Ambulatory Visit: Payer: Self-pay

## 2021-02-27 ENCOUNTER — Ambulatory Visit (INDEPENDENT_AMBULATORY_CARE_PROVIDER_SITE_OTHER): Payer: BC Managed Care – PPO | Admitting: Infectious Diseases

## 2021-02-27 DIAGNOSIS — F3162 Bipolar disorder, current episode mixed, moderate: Secondary | ICD-10-CM

## 2021-02-27 DIAGNOSIS — E669 Obesity, unspecified: Secondary | ICD-10-CM

## 2021-02-27 DIAGNOSIS — B20 Human immunodeficiency virus [HIV] disease: Secondary | ICD-10-CM | POA: Diagnosis not present

## 2021-02-27 DIAGNOSIS — J45991 Cough variant asthma: Secondary | ICD-10-CM | POA: Diagnosis not present

## 2021-02-27 MED ORDER — TESAMORELIN ACETATE 1 MG ~~LOC~~ SOLR: 2.0000 mg | Freq: Every day | SUBCUTANEOUS | 0 refills | Status: DC

## 2021-02-27 NOTE — Assessment & Plan Note (Signed)
Appreciate her f/u at [facility].  She seems stable today.

## 2021-02-27 NOTE — Assessment & Plan Note (Signed)
Appreciate her f/u with PCP Seems well controlled today

## 2021-02-27 NOTE — Assessment & Plan Note (Addendum)
Will not rx phentiramine due to multiple drug interactions. Will re-try egrfita.  Will see her back in 3 months She is NOT taking seroquel.

## 2021-02-27 NOTE — Progress Notes (Signed)
   Subjective:    Patient ID: [name], female  DOB: Mar 18, 1981, 40 y.o.        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 Normal PAP 07-2019 at [facility].  Next due 07-2021.  Currently on biktarvy, (prev atripla) Has regular f/u at behavioral health --> psychiatry (PTSD, insomnia). Has new appt at [facility]. Still "working some things out". Started on Abilify ~ 1 month ago.    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 Has kylena.  Has L kne injury (I don't know (how it happened)). Was on walker for 3 weeks. Still has brace.  Asthma has been intermittent- had to go on steroids in May. Worse when she has sinus infection, pollen blooms.  Has not been able to get into wt loss clinics (they don't take insurance).   HIV 1 RNA Quant  Date Value  02/06/2021 <20 Copies/mL (H)  05/15/2020 <20 Copies/mL  08/28/2019 <20 DETECTED copies/mL   CD4 T Cell Abs (/uL)  Date Value  05/15/2020 1,521  08/28/2019 1,357  04/04/2019 1,560     Health Maintenance  Topic Date Due  . FOOT EXAM  Never done  . OPHTHALMOLOGY EXAM  Never done  . URINE MICROALBUMIN  04/27/2017  . Pneumococcal Vaccine 32-18 Years old (3 - PPSV23 or PCV20) 07/05/2019  . PAP SMEAR-Modifier  09/22/2019  . COVID-19 Vaccine (4 - Booster for Pfizer series) 11/04/2020  . HEMOGLOBIN A1C  08/08/2021  . TETANUS/TDAP  08/02/2022  . PNEUMOCOCCAL POLYSACCHARIDE VACCINE AGE 28-64 HIGH RISK  Completed  . Hepatitis C Screening  Completed  . HIV Screening  Completed  . HPV VACCINES  Aged Out      Review of Systems  Constitutional:  Negative for chills, fever and weight loss.  Respiratory:  Negative for cough and shortness of breath.   Gastrointestinal:  Negative for constipation and diarrhea.  Genitourinary:  Positive for frequency and urgency. Negative for dysuria.  Psychiatric/Behavioral:  The patient has insomnia.    Please see HPI. All other systems reviewed and  negative.     Objective:  Physical Exam Vitals reviewed.  Constitutional:      Appearance: Normal appearance. She is obese.  HENT:     Mouth/Throat:     Mouth: Mucous membranes are moist.     Pharynx: No oropharyngeal exudate.  Eyes:     Extraocular Movements: Extraocular movements intact.     Pupils: Pupils are equal, round, and reactive to light.  Cardiovascular:     Rate and Rhythm: Normal rate and regular rhythm.  Pulmonary:     Effort: Pulmonary effort is normal.     Breath sounds: Normal breath sounds.  Abdominal:     General: Bowel sounds are normal. There is no distension.     Palpations: Abdomen is soft.     Tenderness: There is no abdominal tenderness.  Musculoskeletal:        General: Normal range of motion.     Cervical back: Normal range of motion and neck supple.     Right lower leg: No edema.     Left lower leg: No edema.  Neurological:     General: No focal deficit present.     Mental Status: She is alert.  Psychiatric:        Mood and Affect: Mood normal.          Assessment & Plan:

## 2021-02-27 NOTE — Assessment & Plan Note (Signed)
She is doing well Asks about cabaneuva Will refer to pharm Offered/refused condoms.  Pap at [facility].  rtc in 3 months.

## 2021-03-11 ENCOUNTER — Other Ambulatory Visit (HOSPITAL_COMMUNITY): Payer: Self-pay

## 2021-03-13 ENCOUNTER — Other Ambulatory Visit (HOSPITAL_COMMUNITY): Payer: Self-pay

## 2021-04-23 ENCOUNTER — Other Ambulatory Visit: Payer: Self-pay

## 2021-04-23 ENCOUNTER — Ambulatory Visit (INDEPENDENT_AMBULATORY_CARE_PROVIDER_SITE_OTHER): Payer: BC Managed Care – PPO | Admitting: Infectious Diseases

## 2021-04-23 ENCOUNTER — Ambulatory Visit: Payer: BC Managed Care – PPO

## 2021-04-23 ENCOUNTER — Encounter: Payer: Self-pay | Admitting: Infectious Diseases

## 2021-04-23 ENCOUNTER — Other Ambulatory Visit (HOSPITAL_COMMUNITY): Payer: Self-pay

## 2021-04-23 VITALS — BP 128/84 | HR 78 | Temp 98.2°F | Wt 224.0 lb

## 2021-04-23 DIAGNOSIS — F3162 Bipolar disorder, current episode mixed, moderate: Secondary | ICD-10-CM

## 2021-04-23 DIAGNOSIS — Z113 Encounter for screening for infections with a predominantly sexual mode of transmission: Secondary | ICD-10-CM | POA: Diagnosis not present

## 2021-04-23 DIAGNOSIS — E669 Obesity, unspecified: Secondary | ICD-10-CM

## 2021-04-23 DIAGNOSIS — J45991 Cough variant asthma: Secondary | ICD-10-CM

## 2021-04-23 DIAGNOSIS — B20 Human immunodeficiency virus [HIV] disease: Secondary | ICD-10-CM

## 2021-04-23 DIAGNOSIS — Z79899 Other long term (current) drug therapy: Secondary | ICD-10-CM

## 2021-04-23 MED ORDER — MONTELUKAST SODIUM 10 MG PO TABS: ORAL_TABLET | ORAL | 1 refills | Status: DC

## 2021-04-23 MED ORDER — PHENTERMINE-TOPIRAMATE ER 3.75-23 MG PO CP24: 1.0000 | ORAL_CAPSULE | Freq: Every day | ORAL | 3 refills | Status: DC

## 2021-04-23 NOTE — Assessment & Plan Note (Addendum)
Will start her on phentermine-topiramate. Counseled to stop if planning pregnancy or thinks she is pregnant.  Warned to use back up method due to interaction with Kylena.  Will see if this helps- she has many co-mordibs (increased Glc, psychiatric rx, HIV).

## 2021-04-23 NOTE — Assessment & Plan Note (Signed)
singulair refilled.

## 2021-04-23 NOTE — Assessment & Plan Note (Signed)
She is doing well on biktarvy despite hx of M184V.  partner to start Prep.  Get COVID and Flu when available.  She is interested in cabaneuva.  Will route to pharm.  rtc in 6 months.

## 2021-04-23 NOTE — Assessment & Plan Note (Signed)
She will f/u and get new counselor/psychiatrist.  She will f/u with Marcie Bal as well.

## 2021-04-23 NOTE — Progress Notes (Signed)
   Subjective:    Patient ID: [name], female  DOB: 31-Mar-1981, 40 y.o.        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 Normal PAP 07-2019 at [facility].  Next due 07-2021.  Currently on biktarvy, (prev atripla)- no problems with ART.  Has regular f/u at behavioral health --> psychiatry (PTSD, insomnia). Has new appt at [facility]. Still "working some things out". Started on Abilify this summer. Does not like abilify, has made her gain wt. She has had a falling out with her therapist and is changing. Has appt with Marcie Bal today.    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 Has been challenging with limitations in food supply.   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  Fiance is negative. He gets tested regularly and is going to start PrEP (living in [place]). She is on kylena.  CD4 1062  HIV 1 RNA Quant  Date Value  02/06/2021 <20 Copies/mL (H)  05/15/2020 <20 Copies/mL  08/28/2019 <20 DETECTED copies/mL   CD4 T Cell Abs (/uL)  Date Value  05/15/2020 1,521  08/28/2019 1,357  04/04/2019 1,560     Health Maintenance  Topic Date Due  . FOOT EXAM  Never done  . OPHTHALMOLOGY EXAM  Never done  . Pneumococcal Vaccine 6-55 Years old (3 - PPSV23 or PCV20) 07/05/2019  . PAP SMEAR-Modifier  09/22/2019  . COVID-19 Vaccine (4 - Booster for Pfizer series) 10/29/2020  . HEMOGLOBIN A1C  08/08/2021  . TETANUS/TDAP  08/02/2022  . PNEUMOCOCCAL POLYSACCHARIDE VACCINE AGE 53-64 HIGH RISK  Completed  . Hepatitis C Screening  Completed  . HIV Screening  Completed  . HPV VACCINES  Aged Out      Review of Systems  Constitutional:  Negative for chills, fever and weight loss.  Respiratory:  Positive for shortness of breath (dyspnea on exertion, attributes to her wt.). Negative for cough.   Cardiovascular:  Positive for leg swelling.  Gastrointestinal:  Negative for constipation and diarrhea.  Genitourinary:   Negative for dysuria.  Psychiatric/Behavioral:  The patient is nervous/anxious.    Please see HPI. All other systems reviewed and negative.     Objective:  Physical Exam Vitals reviewed.  Constitutional:      Appearance: Normal appearance. She is obese.  HENT:     Mouth/Throat:     Mouth: Mucous membranes are moist.  Eyes:     Extraocular Movements: Extraocular movements intact.     Pupils: Pupils are equal, round, and reactive to light.  Cardiovascular:     Rate and Rhythm: Normal rate and regular rhythm.  Pulmonary:     Effort: Pulmonary effort is normal.     Breath sounds: Normal breath sounds.  Abdominal:     General: Bowel sounds are normal. There is no distension.     Palpations: Abdomen is soft.     Tenderness: There is no abdominal tenderness.  Musculoskeletal:     Cervical back: Normal range of motion and neck supple.     Right lower leg: Edema present.     Left lower leg: Edema present.  Skin:    General: Skin is warm and dry.  Neurological:     Mental Status: She is alert.           Assessment & Plan:

## 2021-04-24 ENCOUNTER — Telehealth: Payer: Self-pay | Admitting: *Deleted

## 2021-04-24 NOTE — Telephone Encounter (Signed)
Patient left message asking for a prescription switch due to her insurance. She walked into clinic with rejection paperwork for the qysmia.   This new prescription was written 04/23/21 at visit with Dr Johnnye Sima. Will route to him. Landis Gandy, RN

## 2021-04-28 ENCOUNTER — Telehealth: Payer: Self-pay

## 2021-04-28 NOTE — Telephone Encounter (Signed)
Patient called asking if the Qysmia that was sent to the pharmacy could be sent separately or just phentermine. Patient insurance does not cover the the Qysmia.  Please advise.

## 2021-04-29 ENCOUNTER — Other Ambulatory Visit (HOSPITAL_COMMUNITY): Payer: Self-pay

## 2021-04-29 NOTE — Telephone Encounter (Signed)
Looks like this was supposed to go to patient's PCP (see phone note between [facility] and Dr. Johnnye Sima).

## 2021-04-29 NOTE — Telephone Encounter (Signed)
I spoke with [name] (Gross) with patient pcp office and advised her that Dr. Johnnye Sima would like their office to take over filling patient's Qysimia and doing the PA. I also advised [name] that per patient insurance the medication will need to be sent in seperately vs in a combo pill. [name] verbalized understanding and stated patient needs a follow up with them.  I have also spoke to the patient and advised her that she will need to make a follow up with her pcp for them to take over filling her [place]

## 2021-04-29 NOTE — Telephone Encounter (Signed)
When patient walked in with the insurance denial paperwork on 04/24/21, she was advised to bring it to her PCP and ask him for the prescription/alternative.  Landis Gandy, RN

## 2021-04-30 ENCOUNTER — Ambulatory Visit: Payer: BC Managed Care – PPO

## 2021-05-01 ENCOUNTER — Other Ambulatory Visit: Payer: Self-pay | Admitting: Pharmacist

## 2021-05-01 ENCOUNTER — Other Ambulatory Visit (HOSPITAL_COMMUNITY): Payer: Self-pay

## 2021-05-01 ENCOUNTER — Other Ambulatory Visit: Payer: Self-pay | Admitting: Infectious Diseases

## 2021-05-01 ENCOUNTER — Telehealth: Payer: Self-pay | Admitting: Pharmacist

## 2021-05-01 ENCOUNTER — Telehealth: Payer: Self-pay

## 2021-05-01 DIAGNOSIS — B20 Human immunodeficiency virus [HIV] disease: Secondary | ICD-10-CM

## 2021-05-01 NOTE — Telephone Encounter (Signed)
While discussing Apretude approval with patient, she mentioned her insurance does not cover Qsymia for weight loss. Unable to prescribe myself, so forwarding to you. Her insurance will cover the individual components; however, the doses are different in the formulations. [name] mentioned you discussed phentermine by itself as well and was willing to try it if you prescribed it. She would like new prescription sent to the same pharmacy ([facility] in [place]).   Thanks,  Alfonse Spruce, PharmD, CPP Clinical Pharmacist Practitioner Infectious Diseases [place] for Infectious Disease

## 2021-05-01 NOTE — Telephone Encounter (Signed)
I added her to the list and will have the medication here

## 2021-05-01 NOTE — Telephone Encounter (Signed)
[facility] Patient Advocate Encounter  Kern Reap is covered under patients [employer] Benefits.  Patient have met there out of pocket deductible of $2000.00 which the medication will be covered 100%.  Patient will not have a office copay , because she have met her deductible.   I was able to sign patient up in [facility] Portal Claims.     Ileene Patrick, [name] Patient [facility] for Infectious Disease Phone: [phone] Fax:  [phone]

## 2021-05-01 NOTE — Telephone Encounter (Signed)
[name] set up for her first injection with me on 10/6. Thanks!

## 2021-05-07 ENCOUNTER — Ambulatory Visit: Payer: BC Managed Care – PPO

## 2021-05-21 ENCOUNTER — Other Ambulatory Visit: Payer: Self-pay

## 2021-05-21 ENCOUNTER — Ambulatory Visit (INDEPENDENT_AMBULATORY_CARE_PROVIDER_SITE_OTHER): Payer: BC Managed Care – PPO | Admitting: Pharmacist

## 2021-05-21 DIAGNOSIS — B20 Human immunodeficiency virus [HIV] disease: Secondary | ICD-10-CM | POA: Diagnosis not present

## 2021-05-21 MED ORDER — CABOTEGRAVIR & RILPIVIRINE ER 600 & 900 MG/3ML IM SUER
1.0000 | Freq: Once | INTRAMUSCULAR | Status: AC
  Administered 2021-05-21: 1 via INTRAMUSCULAR

## 2021-05-21 MED ORDER — CABOTEGRAVIR & RILPIVIRINE ER 600 & 900 MG/3ML IM SUER: 1.0000 | INTRAMUSCULAR | 0 refills | Status: AC

## 2021-05-21 NOTE — Progress Notes (Signed)
HPI: [name] is a 40 y.o. female who presents to the [facility] clinic for [place] administration.  Patient Active Problem List   Diagnosis Date Noted   Hyperglycemia 08/28/2019   Obesity (BMI 35.0-39.9 without comorbidity) 07/26/2019   Costochondritis, acute 01/02/2018   Asthma exacerbation 08/30/2017   Bipolar mixed affective disorder, moderate (HCC) 03/20/2017   Chronic tension headaches 01/23/2017   Hyperlipidemia 11/17/2015   Sinusitis 06/30/2015   Hernia, abdominal 08/05/2014   LGSIL of cervix of undetermined significance 10/24/2013   Superficial Liver masses, right lobe; suspected dermoid recurrence.   05/07/2013   Ovarian cystic mass 05/07/2013   s/p Serosal repair of colon 12/04/2012 12/15/2012   Herpes genitalis in women 12/07/2012   Anxiety state, unspecified 12/07/2012   GERD (gastroesophageal reflux disease) 12/07/2012   Generalized anxiety disorder 12/07/2012   Pelvic mass in female s/p Robotic-assisted right salpingo-oophorectomy 12/04/2012   HEMORRHOIDS NOS, W/O COMPLICATIONS 59/93/5701   Depression 11/25/2006   Allergic rhinitis 11/25/2006   ASTHMA, COUGH VARIANT 11/15/2006   Human immunodeficiency virus (HIV) disease ([place]) 11/10/2006    Patient's Medications  New Prescriptions   No medications on file  Previous Medications   ALBUTEROL (PROAIR HFA) 108 (90 BASE) MCG/ACT INHALER    INHALE TWO PUFFS INTO THE LUNGS EVERY 6 HOURS AS NEEDED FOR WHEEZING.   ALBUTEROL (PROVENTIL) (2.5 MG/3ML) 0.083% NEBULIZER SOLUTION    Take 3 mLs (2.5 mg total) by nebulization every 6 (six) hours as needed for wheezing or shortness of breath (If no relief from MDI inhaler).   ARIPIPRAZOLE (ABILIFY) 10 MG TABLET    Take 10 mg by mouth every morning.   BIKTARVY 50-200-25 MG TABS TABLET    TAKE 1 TABLET BY MOUTH EVERY DAY   BUPROPION (WELLBUTRIN XL) 300 MG 24 HR TABLET    Take 1 tablet (300 mg total) by mouth daily.   CETIRIZINE (ZYRTEC) 10 MG TABLET    Take 2 tablets  (20 mg total) by mouth at bedtime.   CITALOPRAM (CELEXA) 40 MG TABLET    Take 1 tablet (40 mg total) by mouth daily.   CLINDAMYCIN (CLEOCIN T) 1 % EXTERNAL SOLUTION    USE DAILY FOR PREVENTION OR 2 TIMES DAILY WHEN ACTIVE LESIONS   DENTA 5000 PLUS 1.1 % CREA DENTAL CREAM    USE SMALL AMOUNT ON TEETH AND GUMS EVERY EVENING SPIT OUT EXCESS AND DO NOT RINSE   DICLOFENAC (VOLTAREN) 25 MG EC TABLET    Take 25 mg by mouth 3 (three) times daily.   ERENUMAB-AOOE (AIMOVIG) 140 MG/ML SOAJ    Inject 140 mg into the skin every 30 (thirty) days.   FLUOCINOLONE ACETONIDE BODY 0.01 % OIL    Apply to scalp once daily as needed for itching/rash.   FLUOXETINE (PROZAC) 10 MG CAPSULE    Take 10 mg by mouth every morning.   FLUOXETINE (PROZAC) 20 MG CAPSULE    Take 20 mg by mouth every morning.   GABAPENTIN (NEURONTIN) 300 MG CAPSULE    Take 1 capsule (300 mg total) by mouth at bedtime.   GALCANEZUMAB-GNLM (EMGALITY) 120 MG/ML SOAJ    Inject 120 mg into the skin every 28 (twenty-eight) days.   HYDROCORTISONE 2.5 % CREAM    Place rectally.   IPRATROPIUM (ATROVENT) 0.03 % NASAL SPRAY    Place into the nose.   KETOCONAZOLE (NIZORAL) 2 % CREAM    APPLY EXTERNALLY TO THE AFFECTED AREA EVERY DAY FOR 2 WEEKS   LEVONORGESTREL (KYLEENA IU)  by Intrauterine route.   MELOXICAM (MOBIC) 15 MG TABLET    Take 1 tablet by mouth daily.   MONTELUKAST (SINGULAIR) 10 MG TABLET    TAKE 1 TABLET AT BEDTIME BY MOUTH.   OLANZAPINE (ZYPREXA) 2.5 MG TABLET    Take 2.5 mg by mouth at bedtime.   OMEPRAZOLE (PRILOSEC) 40 MG CAPSULE    daily.   OMEPRAZOLE (PRILOSEC) 40 MG CAPSULE    Take 1 capsule by mouth daily.   ONDANSETRON (ZOFRAN-ODT) 4 MG DISINTEGRATING TABLET    DISSOLVE 1 TABLET(4 MG) ON THE TONGUE EVERY 8 HOURS AS NEEDED FOR NAUSEA OR VOMITING   OXYMETAZOLINE (AFRIN) 0.05 % NASAL SPRAY    Place 1 spray into both nostrils 2 (two) times daily. For <7 days at a time.   PHENTERMINE-TOPIRAMATE 3.75-23 MG CP24    Take 1 tablet by mouth  daily.   PRAZOSIN (MINIPRESS) 1 MG CAPSULE    Take 1 mg by mouth at bedtime.   QVAR REDIHALER 40 MCG/ACT INHALER    INHALE 2 PUFFS INTO THE LUNGS TWICE DAILY   RIMEGEPANT SULFATE (NURTEC) 75 MG TBDP    Take 1 tablet by mouth daily as needed (Maximum 1 tablet in 24 hours).   SPIRONOLACTONE (ALDACTONE) 25 MG TABLET    Take 50 mg by mouth daily.   TESAMORELIN ACETATE 1 MG SOLR    Inject 2 mg into the skin daily.   TIZANIDINE (ZANAFLEX) 4 MG TABLET    Take 4 mg by mouth 3 (three) times daily.   TRAZODONE (DESYREL) 100 MG TABLET    Take 1 tablet (100 mg total) by mouth at bedtime. Take 1-2 tablets for sleep   TRETINOIN (RETIN-A) 0.025 % CREAM    SMARTSIG:Sparingly Topical Every Night PRN   TRIAMCINOLONE CREAM (KENALOG) 0.1 %    Apply 1 application topically 2 (two) times daily.   VALACYCLOVIR (VALTREX) 1000 MG TABLET    TAKE 1 TABLET(1000 MG) BY MOUTH DAILY  Modified Medications   No medications on file  Discontinued Medications   No medications on file    Allergies: Allergies  Allergen Reactions   Latuda [Lurasidone Hcl] Other (See Comments)    Occular dystonia   Seroquel [Quetiapine Fumarate] Swelling    Tongue swells    Benzoin Other (See Comments)    blisters   Latex Rash and Other (See Comments)   Sulfamethoxazole-Trimethoprim Itching and Nausea And Vomiting    Past Medical History: Past Medical History:  Diagnosis Date   Allergy    Anemia    Anxiety    Anxiety state, unspecified 12/07/2012   Arthritis    Asthma    Bipolar disorder, unspecified ([place]) 12/07/2012   Depression    Diabetes mellitus without complication (HCC)    no meds currently- controlled with diet   GERD (gastroesophageal reflux disease)    GERD (gastroesophageal reflux disease) 12/07/2012   Headache(784.0)    migraines   Herpes genitalis in women 12/07/2012   HIV infection ([place])    Human immunodeficiency virus (HIV) disease ([place]) 12/07/2012   Neuromuscular disorder ([place])    carpal tunnel in both arms    SHINGLES 01/31/2009   Qualifier: Diagnosis of  By: Tomma Lightning MD, Claiborne Billings     Type II or unspecified type diabetes mellitus without mention of complication, not stated as uncontrolled 12/07/2012   Unspecified asthma(493.90) 12/07/2012    Social History: Social History   Socioeconomic History   Marital status: Single    Spouse name: Not on file  Number of children: 1   Years of education: 12   Highest education level: Some college, no degree  Occupational History   Occupation: Nutrition Social research officer, government: [employer]  Tobacco Use   Smoking status: Never   Smokeless tobacco: Never  Vaping Use   Vaping Use: Never used  Substance and Sexual Activity   Alcohol use: Yes    Alcohol/week: 0.0 standard drinks    Comment: OCC.about once a month    Drug use: No   Sexual activity: Never    Partners: Male    Birth control/protection: Implant  Other Topics Concern   Not on file  Social History Narrative   Patient lives alone.   Patient works at home.   Patient has hs education.   Patient has 1 child.   Patient drinks multiple energy drinks and coffees throughout day, daily.      Patient is right-handed. She lives with her daughter in a one level home. She drinks one cup of coffee a day. She works for [employer] in [place] and is very active at work.   Social Determinants of Health   Financial Resource Strain: Not on file  Food Insecurity: Not on file  Transportation Needs: Not on file  Physical Activity: Not on file  Stress: Not on file  Social Connections: Not on file    Labs: Lab Results  Component Value Date   HIV1RNAQUANT <20 (H) 02/06/2021   HIV1RNAQUANT <20 05/15/2020   HIV1RNAQUANT <20 DETECTED 08/28/2019   CD4TABS 1,521 05/15/2020   CD4TABS 1,357 08/28/2019   CD4TABS 1,560 04/04/2019    RPR and STI Lab Results  Component Value Date   LABRPR NON-REACTIVE 02/06/2021   LABRPR NON-REACTIVE 05/15/2020   LABRPR NON-REACTIVE  08/28/2019   LABRPR NON-REACTIVE 04/04/2019   LABRPR NON-REACTIVE 06/27/2018    STI Results GC GC CT CT  02/06/2021 Negative - Negative -  05/16/2020 Negative - Negative -  08/28/2019 Negative - Negative -  09/21/2018 Negative - Negative -  06/27/2018 Negative - Negative -  01/02/2018 Negative - Negative -  10/27/2017 Negative - Negative -  08/03/2017 Negative - Negative -  04/06/2017 Negative - Negative -  08/13/2016 Negative - Negative -  02/06/2016 Negative - Negative -  11/05/2015 Negative - Negative -  06/06/2015 Negative - Negative -  12/26/2014 Negative - Negative -  02/12/2013 - NEGATIVE - NEGATIVE  06/21/2010 - - NEGATIVE (NOTE)  Testing performed using the BD ProbeTec Qx Chlamydia trachomatis and Neisseria gonorrhea amplified DNA assay.  Performed at:  [facility] [address], [place] [address]               [id number] -  07/12/2007 - - NEGATIVE (NOTE)  Testing performed using the BD Probetec ET Chlamydia trachomatis and Neisseria gonorrhea amplified DNA assay. -    Hepatitis B Lab Results  Component Value Date   HEPBSAB REACTIVE (A) 08/03/2017   HEPBSAG NEG 11/10/2006   HEPBCAB NEG 11/10/2006   Hepatitis C No results found for: HEPCAB, HCVRNAPCRQN Hepatitis A No results found for: HAV Lipids: Lab Results  Component Value Date   CHOL 219 (H) 02/06/2021   TRIG 92  02/06/2021   HDL 46 (L) 02/06/2021   CHOLHDL 4.8 02/06/2021   VLDL 25 04/06/2017   LDLCALC 153 (H) 02/06/2021    Current HIV Regimen: Biktarvy  TARGET DATE: The 6th of the month  Assessment: [name] presents today for their first initiation injection for Cabenuva. Counseled that [name] is two separate intramuscular injections in the gluteal muscle on each side for each visit. Explained that the second injection is 30 days after the initial injection then every 2 months thereafter. Discussed the need for viral  load monitoring every 2 months for the first 6 months and then periodically afterwards as their provider sees the need. Discussed the rare but significant chance of developing resistance despite compliance. Explained that showing up to injection appointments is very important and warned that if 2 appointments are missed, it will be reassessed by their provider whether they are a good candidate for injection therapy. Counseled on possible side effects associated with the injections such as injection site pain, which is usually mild to moderate in nature, injection site nodules, and injection site reactions. Asked to call the clinic or send me a mychart message if they experience any issues, such as fatigue, nausea, headache, rash, or dizziness. Advised that they can take ibuprofen or tylenol for injection site pain if needed.   Administered cabotegravir 600mg /71mL in left upper outer quadrant of the gluteal muscle. Administered rilpivirine 900 mg/57mL in the right upper outer quadrant of the gluteal muscle. Monitored patient for 10 minutes after injection. Injections were tolerated well without issue. Counseled to stop taking Biktarvy after today's dose and to call with any issues that may arise. Will make follow up appointments for second initiation injection in 30 days and then maintenance injections every 2 months thereafter for 6 months.   Patient received flu shot through PCP already and plans to get COVID booster at her next appointment with me.   Plan: - Stop [place] injections administered - Second initiation injection scheduled for 11/4 with me - Maintenance injections scheduled for 1/5 with Dr. Johnnye Sima and 3/2 with me - Call with any issues or questions  Alfonse Spruce, PharmD, CPP Clinical Pharmacist Practitioner [place] for Infectious Disease

## 2021-06-01 ENCOUNTER — Encounter: Payer: Self-pay | Admitting: Pharmacist

## 2021-06-02 ENCOUNTER — Ambulatory Visit: Payer: BC Managed Care – PPO | Admitting: Infectious Diseases

## 2021-06-18 ENCOUNTER — Ambulatory Visit: Payer: BC Managed Care – PPO | Admitting: Pharmacist

## 2021-06-18 ENCOUNTER — Ambulatory Visit (INDEPENDENT_AMBULATORY_CARE_PROVIDER_SITE_OTHER): Payer: BC Managed Care – PPO | Admitting: Pharmacist

## 2021-06-18 ENCOUNTER — Other Ambulatory Visit: Payer: Self-pay

## 2021-06-18 DIAGNOSIS — B20 Human immunodeficiency virus [HIV] disease: Secondary | ICD-10-CM | POA: Diagnosis not present

## 2021-06-18 MED ORDER — CABOTEGRAVIR & RILPIVIRINE ER 600 & 900 MG/3ML IM SUER
1.0000 | Freq: Once | INTRAMUSCULAR | Status: AC
Start: 2021-06-18 — End: 2021-06-18
  Administered 2021-06-18: 1 via INTRAMUSCULAR

## 2021-06-18 NOTE — Progress Notes (Signed)
06/18/2021  HPI: [name] is a 40 y.o. female who presents to the [facility] clinic for HIV follow-up.  Patient Active Problem List   Diagnosis Date Noted   Hyperglycemia 08/28/2019   Obesity (BMI 35.0-39.9 without comorbidity) 07/26/2019   Costochondritis, acute 01/02/2018   Asthma exacerbation 08/30/2017   Bipolar mixed affective disorder, moderate (HCC) 03/20/2017   Chronic tension headaches 01/23/2017   Hyperlipidemia 11/17/2015   Sinusitis 06/30/2015   Hernia, abdominal 08/05/2014   LGSIL of cervix of undetermined significance 10/24/2013   Superficial Liver masses, right lobe; suspected dermoid recurrence.   05/07/2013   Ovarian cystic mass 05/07/2013   s/p Serosal repair of colon 12/04/2012 12/15/2012   Herpes genitalis in women 12/07/2012   Anxiety state, unspecified 12/07/2012   GERD (gastroesophageal reflux disease) 12/07/2012   Generalized anxiety disorder 12/07/2012   Pelvic mass in female s/p Robotic-assisted right salpingo-oophorectomy 12/04/2012   HEMORRHOIDS NOS, W/O COMPLICATIONS 95/28/4132   Depression 11/25/2006   Allergic rhinitis 11/25/2006   ASTHMA, COUGH VARIANT 11/15/2006   Human immunodeficiency virus (HIV) disease ([place]) 11/10/2006    Patient's Medications  New Prescriptions   No medications on file  Previous Medications   ALBUTEROL (PROAIR HFA) 108 (90 BASE) MCG/ACT INHALER    INHALE TWO PUFFS INTO THE LUNGS EVERY 6 HOURS AS NEEDED FOR WHEEZING.   ALBUTEROL (PROVENTIL) (2.5 MG/3ML) 0.083% NEBULIZER SOLUTION    Take 3 mLs (2.5 mg total) by nebulization every 6 (six) hours as needed for wheezing or shortness of breath (If no relief from MDI inhaler).   ARIPIPRAZOLE (ABILIFY) 10 MG TABLET    Take 10 mg by mouth every morning.   BUPROPION (WELLBUTRIN XL) 300 MG 24 HR TABLET    Take 1 tablet (300 mg total) by mouth daily.   CABOTEGRAVIR & RILPIVIRINE ER (CABENUVA) 600 & 900 MG/3ML INJECTION    Inject 1 kit into the muscle every 2 (two) months.    CETIRIZINE (ZYRTEC) 10 MG TABLET    Take 2 tablets (20 mg total) by mouth at bedtime.   CITALOPRAM (CELEXA) 40 MG TABLET    Take 1 tablet (40 mg total) by mouth daily.   CLINDAMYCIN (CLEOCIN T) 1 % EXTERNAL SOLUTION    USE DAILY FOR PREVENTION OR 2 TIMES DAILY WHEN ACTIVE LESIONS   DENTA 5000 PLUS 1.1 % CREA DENTAL CREAM    USE SMALL AMOUNT ON TEETH AND GUMS EVERY EVENING SPIT OUT EXCESS AND DO NOT RINSE   DICLOFENAC (VOLTAREN) 25 MG EC TABLET    Take 25 mg by mouth 3 (three) times daily.   ERENUMAB-AOOE (AIMOVIG) 140 MG/ML SOAJ    Inject 140 mg into the skin every 30 (thirty) days.   FLUOCINOLONE ACETONIDE BODY 0.01 % OIL    Apply to scalp once daily as needed for itching/rash.   FLUOXETINE (PROZAC) 10 MG CAPSULE    Take 10 mg by mouth every morning.   FLUOXETINE (PROZAC) 20 MG CAPSULE    Take 20 mg by mouth every morning.   GABAPENTIN (NEURONTIN) 300 MG CAPSULE    Take 1 capsule (300 mg total) by mouth at bedtime.   GALCANEZUMAB-GNLM (EMGALITY) 120 MG/ML SOAJ    Inject 120 mg into the skin every 28 (twenty-eight) days.   HYDROCORTISONE 2.5 % CREAM    Place rectally.   IPRATROPIUM (ATROVENT) 0.03 % NASAL SPRAY    Place into the nose.   KETOCONAZOLE (NIZORAL) 2 % CREAM    APPLY EXTERNALLY TO THE AFFECTED AREA  EVERY DAY FOR 2 WEEKS   LEVONORGESTREL (KYLEENA IU)    by Intrauterine route.   MELOXICAM (MOBIC) 15 MG TABLET    Take 1 tablet by mouth daily.   MONTELUKAST (SINGULAIR) 10 MG TABLET    TAKE 1 TABLET AT BEDTIME BY MOUTH.   OLANZAPINE (ZYPREXA) 2.5 MG TABLET    Take 2.5 mg by mouth at bedtime.   OMEPRAZOLE (PRILOSEC) 40 MG CAPSULE    daily.   OMEPRAZOLE (PRILOSEC) 40 MG CAPSULE    Take 1 capsule by mouth daily.   ONDANSETRON (ZOFRAN-ODT) 4 MG DISINTEGRATING TABLET    DISSOLVE 1 TABLET(4 MG) ON THE TONGUE EVERY 8 HOURS AS NEEDED FOR NAUSEA OR VOMITING   OXYMETAZOLINE (AFRIN) 0.05 % NASAL SPRAY    Place 1 spray into both nostrils 2 (two) times daily. For <7 days at a time.    PHENTERMINE-TOPIRAMATE 3.75-23 MG CP24    Take 1 tablet by mouth daily.   PRAZOSIN (MINIPRESS) 1 MG CAPSULE    Take 1 mg by mouth at bedtime.   QVAR REDIHALER 40 MCG/ACT INHALER    INHALE 2 PUFFS INTO THE LUNGS TWICE DAILY   RIMEGEPANT SULFATE (NURTEC) 75 MG TBDP    Take 1 tablet by mouth daily as needed (Maximum 1 tablet in 24 hours).   SPIRONOLACTONE (ALDACTONE) 25 MG TABLET    Take 50 mg by mouth daily.   TESAMORELIN ACETATE 1 MG SOLR    Inject 2 mg into the skin daily.   TIZANIDINE (ZANAFLEX) 4 MG TABLET    Take 4 mg by mouth 3 (three) times daily.   TRAZODONE (DESYREL) 100 MG TABLET    Take 1 tablet (100 mg total) by mouth at bedtime. Take 1-2 tablets for sleep   TRETINOIN (RETIN-A) 0.025 % CREAM    SMARTSIG:Sparingly Topical Every Night PRN   TRIAMCINOLONE CREAM (KENALOG) 0.1 %    Apply 1 application topically 2 (two) times daily.   VALACYCLOVIR (VALTREX) 1000 MG TABLET    TAKE 1 TABLET(1000 MG) BY MOUTH DAILY  Modified Medications   No medications on file  Discontinued Medications   No medications on file    Allergies: Allergies  Allergen Reactions   Latuda [Lurasidone Hcl] Other (See Comments)    Occular dystonia   Seroquel [Quetiapine Fumarate] Swelling    Tongue swells    Benzoin Other (See Comments)    blisters   Latex Rash and Other (See Comments)   Sulfamethoxazole-Trimethoprim Itching and Nausea And Vomiting    Past Medical History: Past Medical History:  Diagnosis Date   Allergy    Anemia    Anxiety    Anxiety state, unspecified 12/07/2012   Arthritis    Asthma    Bipolar disorder, unspecified ([place]) 12/07/2012   Depression    Diabetes mellitus without complication (HCC)    no meds currently- controlled with diet   GERD (gastroesophageal reflux disease)    GERD (gastroesophageal reflux disease) 12/07/2012   Headache(784.0)    migraines   Herpes genitalis in women 12/07/2012   HIV infection ([place])    Human immunodeficiency virus (HIV) disease ([place]) 12/07/2012    Neuromuscular disorder ([place])    carpal tunnel in both arms   SHINGLES 01/31/2009   Qualifier: Diagnosis of  By: Tomma Lightning MD, Claiborne Billings     Type II or unspecified type diabetes mellitus without mention of complication, not stated as uncontrolled 12/07/2012   Unspecified asthma(493.90) 12/07/2012    Social History: Social History   Socioeconomic History  Marital status: Single    Spouse name: Not on file   Number of children: 1   Years of education: 6   Highest education level: Some college, no degree  Occupational History   Occupation: Nutrition Social research officer, 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   Patient works at home.   Patient has hs education.   Patient has 1 child.   Patient drinks multiple energy drinks and coffees throughout day, daily.      Patient is right-handed. She lives with her daughter in a one level home. She drinks one cup of coffee a day. She works for [employer] in [place] and is very active at work.   Social Determinants of Health   Financial Resource Strain: Not on file  Food Insecurity: Not on file  Transportation Needs: Not on file  Physical Activity: Not on file  Stress: Not on file  Social Connections: Not on file    Labs: Lab Results  Component Value Date   HIV1RNAQUANT <20 (H) 02/06/2021   HIV1RNAQUANT <20 05/15/2020   HIV1RNAQUANT <20 DETECTED 08/28/2019   CD4TABS 1,521 05/15/2020   CD4TABS 1,357 08/28/2019   CD4TABS 1,560 04/04/2019    RPR and STI Lab Results  Component Value Date   LABRPR NON-REACTIVE  02/06/2021   LABRPR NON-REACTIVE 05/15/2020   LABRPR NON-REACTIVE 08/28/2019   LABRPR NON-REACTIVE 04/04/2019   LABRPR NON-REACTIVE 06/27/2018    STI Results GC GC CT CT  02/06/2021 Negative - Negative -  05/16/2020 Negative - Negative -  08/28/2019 Negative - Negative -  09/21/2018 Negative - Negative -  06/27/2018 Negative - Negative -  01/02/2018 Negative - Negative -  10/27/2017 Negative - Negative -  08/03/2017 Negative - Negative -  04/06/2017 Negative - Negative -  08/13/2016 Negative - Negative -  02/06/2016 Negative - Negative -  11/05/2015 Negative - Negative -  06/06/2015 Negative - Negative -  12/26/2014 Negative - Negative -  02/12/2013 - NEGATIVE - NEGATIVE  06/21/2010 - - NEGATIVE (NOTE)  Testing performed using the BD ProbeTec Qx Chlamydia trachomatis and Neisseria gonorrhea amplified DNA assay.  Performed at:  [facility] [address], [place] [address]               [id number] -  07/12/2007 - - NEGATIVE (NOTE)  Testing performed using the BD Probetec ET Chlamydia trachomatis and Neisseria gonorrhea amplified DNA assay. -    Hepatitis B Lab Results  Component Value Date   HEPBSAB REACTIVE (A) 08/03/2017   HEPBSAG NEG 11/10/2006   HEPBCAB NEG 11/10/2006   Hepatitis C No results found for: HEPCAB, HCVRNAPCRQN Hepatitis A No results found for: HAV Lipids: Lab Results  Component Value Date   CHOL 219 (H) 02/06/2021   TRIG 92 02/06/2021   HDL 46 (L) 02/06/2021   CHOLHDL 4.8 02/06/2021   VLDL 25 04/06/2017   LDLCALC 153 (H) 02/06/2021    Current HIV Regimen: Cabenuva  Assessment: Satori presents to clinic today for her HIV follow-up appointment. She is here to receive her second Cabenuva injection. She reports that she had some mild soreness with her first injection, but that subsided after a couple pf days. She has only been with her fiance, and declined STI testing  today. Her fiance is currently in [place] and is going to start Apretude soon. Once finished with school, he will be moving to the area and likely follow at [facility] as well. She does not have any issues or concerns today. She is looking forward to her wedding soon. After today's visit, she will begin to follow-up every 2 months for Cabenuva injections.   Administered cabotegravir 600mg /85mL in left upper outer quadrant of the gluteal muscle. Administered rilpivirine 900 mg/69mL in the right upper outer quadrant of the gluteal muscle. Monitored patient for 15 minutes after injection. Injections were tolerated well without issue.    Plan: Administered Cabenuva Check HIV RNA Follow up on 08/20/21 @ 3:15 with Dr. Johnnye Sima, 10/15/21 @3 :15 with Kandis Cocking, PharmD PGY2 Infectious Diseases Pharmacy Resident

## 2021-06-19 ENCOUNTER — Encounter: Payer: BC Managed Care – PPO | Admitting: Pharmacist

## 2021-06-20 LAB — HIV-1 RNA QUANT-NO REFLEX-BLD
HIV 1 RNA Quant: 30 Copies/mL — ABNORMAL HIGH
HIV-1 RNA Quant, Log: 1.47 Log cps/mL — ABNORMAL HIGH

## 2021-07-15 NOTE — Progress Notes (Signed)
New Patient Note  RE: [name] MRN: [id number] DOB: [birth year] Date of Office Visit: 07/16/2021  Consult requested by: Andria Frames, PA-C Primary care provider: Andria Frames, PA-C  Chief Complaint: Cough (Random coughing episodes ), Asthma (Was on steroids a couple of weeks ago Nov. 3rd ( for asthma and cough) ), Allergic Rhinitis  (Off of all her antihistamines - water eyes, sneezing, itching ), and Eczema (Not sure if this is the cause of itching )  History of Present Illness: I had the pleasure of seeing [name] for initial evaluation at the [facility] on 07/20/2021. She is a 40 y.o. female, who is referred here by Andria Frames, PA-C for the evaluation of asthma and allergic rhinitis.  Asthma: She reports symptoms of chest tightness, shortness of breath, coughing, wheezing, nocturnal awakenings for 30 years. Current medications include albuterol prn, Singulair, Qvar 78mcg 2 puffs BID x 1 yr which helps. She reports not using aerochamber with inhalers. She tried the following inhalers: Advair. Main triggers are unknown but worse with exertion  In the last month, frequency of symptoms: depends. Frequency of nocturnal symptoms: 0x/month. Frequency of SABA use: <1x/week. Interference with physical activity: yes. Sleep is undisturbed. In the last 12 months, emergency room visits/urgent care visits/doctor office visits or hospitalizations due to respiratory issues: 3. In the last 12 months, oral steroids courses: 3. Lifetime history of hospitalization for respiratory issues: 2019 hospitalization for pneumonia. Prior intubations: no. History of pneumonia: yes. She was evaluated by allergist/pulmonologist in the past. Smoking exposure: denies. Up to date with flu vaccine: yes. Up to date with COVID-19 vaccine: yes. Prior Covid-19 infection: no. History of reflux: yes and takes omeprazole with good benefit.  Rhinitis: She reports symptoms of  sneezing, watery eyes, itchy throat, itchy throat, nasal congestion, rhinorrhea. Symptoms have been going on for 25+ years. The symptoms are present all year around with worsening in spring. Other triggers include exposure to none. Anosmia: no. Headache: yes. She has used Claritin, allegra, zyrtec, Xyzal, Flonase, ipratropium with some improvement in symptoms. Sinus infections: 4-5 times per year. Previous work up includes: skin testing in 2020 showed multiple positives per patient report. No prior AIT. Previous ENT evaluation: yes 1 year ago, no prior sinus surgery. Previous sinus imaging: no. History of nasal polyps: no. Last eye exam: last year. Patient does horseback riding with no issues.   Assessment and Plan: Aneesah is a 40 y.o. female with: Asthma, not well controlled Diagnosed with asthma 30 years ago.  Currently using Singulair, Qvar 40 mcg 2 puffs twice a day and albuterol as needed with some benefit. Today's spirometry showed some restriction with 13% improvement in FEV1 post bronchodilator treatment.  Clinically feeling unchanged. Today's skin prick testing was positive to trees, cat and horse.  Borderline to milk. Daily controller medication(s): start Breo 147mcg 1 puff daily and rinse mouth after each use. Demonstrated proper use. Continue Singulair (montelukast) 10mg  daily at night. During upper respiratory infections/asthma flares:  Start Qvar 88mcg 2 puffs twice a day for 1-2 weeks until your breathing symptoms return to baseline.  Pretreat with albuterol 2 puffs or albuterol nebulizer.  If you need to use your albuterol nebulizer machine back to back within 15-30 minutes with no relief then please go to the ER/urgent care for further evaluation.  May use albuterol rescue inhaler 2 puffs every 4 to 6 hours as needed for shortness of breath, chest tightness, coughing, and wheezing. May  use albuterol rescue inhaler 2 puffs 5 to 15 minutes prior to strenuous physical activities.  Monitor frequency of use.  Get spirometry at next visit.  Other allergic rhinitis Perennial rhinoconjunctivitis symptoms for 25+ years with worsening in the spring.  Tried over-the-counter antihistamines, Flonase and ipratropium with some benefit.  2020 testing showed multiple positives per patient report.  No prior AIT.  ENT evaluation done.  No prior sinus surgery. Today's skin prick testing showed: Positive to trees, cat and horse. Get bloodwork instead of intradermal testing as already getting bloodwork as below.  Start environmental control measures as below. Continue Singulair (montelukast) 10mg  daily at night. Use over the counter antihistamines such as Zyrtec (cetirizine), Claritin (loratadine), Allegra (fexofenadine), or Xyzal (levocetirizine) daily as needed. May take twice a day during allergy flares. May switch antihistamines every few months. Start Ryaltris (olopatadine + mometasone nasal spray combination) 1-2 sprays per nostril twice a day. Sample given. Consider allergy injections for long term control if above medications do not help the symptoms - handout given.   Frequent sinus infections Frequent sinus infections requiring 2-3 antibiotics per year.  Patient was diagnosed with HIV in 2008 and is followed by infectious disease. Keep track of infections and antibiotics. Get bloodwork as below. Discussed with patient that her HIV status and environmental allergies are 2 factors that may also predispose her to frequent infections.   Urticaria No triggers noted. Monitor symptoms and take pictures.  Get bloodwork.  Other atopic dermatitis Eczema since birth and acidic foods/spicy foods and milk flare her eczema. See below for proper skin care.  Heartburn See handout for lifestyle and dietary modifications. Continue omeprazole daily and nothing to eat/drink for 30 minutes afterwards.   Return in about 2 months (around 09/16/2021).  Meds ordered this encounter  Medications    fluticasone furoate-vilanterol (BREO ELLIPTA) 100-25 MCG/ACT AEPB    Sig: Inhale 1 puff into the lungs daily. Rinse mouth after each use    Dispense:  60 each    Refill:  3   albuterol (PROVENTIL) (2.5 MG/3ML) 0.083% nebulizer solution    Sig: Take 3 mLs (2.5 mg total) by nebulization every 4 (four) hours as needed for wheezing or shortness of breath (coughing fits).    Dispense:  75 mL    Refill:  2   albuterol (VENTOLIN HFA) 108 (90 Base) MCG/ACT inhaler    Sig: Inhale 2 puffs into the lungs every 4 (four) hours as needed for wheezing or shortness of breath (coughing fits).    Dispense:  18 g    Refill:  1   Olopatadine-Mometasone (RYALTRIS) 665-25 MCG/ACT SUSP    Sig: Place 2 sprays into both nostrils 2 (two) times daily.    Dispense:  29 g    Refill:  5    Lab Orders         Alpha-Gal Panel         Alpha-1-antitrypsin         ANA w/Reflex         C3 and C4         CBC with Differential/Platelet         Chronic Urticaria         Comprehensive metabolic panel         C-reactive protein         Sedimentation rate         Tryptase         Thyroid Cascade Profile  Strep pneumoniae 23 Serotypes IgG         Diphtheria / Tetanus Antibody Panel         IgG, IgA, IgM         Allergens w/Total IgE Area 2         IgE Milk w/ Component Reflex      Other allergy screening: Food allergy: no Medication allergy: yes Hymenoptera allergy: no Large localized reactions Urticaria: yes - with no known triggers. Eczema:yes Since birth and tried hydrocortisone with some benefit. Acidic foods and tomatoes, spicy foods and milk flare her eczema.  History of recurrent infections suggestive of immunodeficency: yes Patient has history multiple infections including sinus infection, pneumonia, ear infections. Denies any GI infections/diarrhea, skin infections/abscesses. Patient has no history of opportunistic infections including fungal infections, viral infections.   Patient reports  2-3 antibiotic use in the last 12 months. Patient does not have any secondary causes of immunodeficiency including chronic steroid use, diabetes mellitus, protein losing enteropathy, renal or hepatic dysfunction, history of cancer or irradiation, hepatitis B or C. Patient was diagnosed with HIV in 2008 and is under good control - she follows with ID.   Diagnostics: Spirometry:  Tracings reviewed. Her effort: It was hard to get consistent efforts and there is a question as to whether this reflects a maximal maneuver. FVC: 2.38L FEV1: 1.85L, 72% predicted FEV1/FVC ratio: 78% Interpretation: Spirometry consistent with possible restrictive disease with 13% improvement in FEV1 post bronchodilator treatment. Clinically feeling the same.   Please see scanned spirometry results for details.  Skin Testing: Environmental allergy panel and select foods. Positive to trees, cat and horse. Borderline to milk. Results discussed with patient/family.  Past Medical History: Patient Active Problem List   Diagnosis Date Noted   Allergic conjunctivitis of both eyes 07/20/2021   Other atopic dermatitis 07/20/2021   Heartburn 07/20/2021   Hyperglycemia 08/28/2019   Obesity (BMI 35.0-39.9 without comorbidity) 07/26/2019   Costochondritis, acute 01/02/2018   Asthma, not well controlled 08/30/2017   Bipolar mixed affective disorder, moderate ([place]) 03/20/2017   Chronic tension headaches 01/23/2017   Hyperlipidemia 11/17/2015   Frequent sinus infections 06/30/2015   Hernia, abdominal 08/05/2014   LGSIL of cervix of undetermined significance 10/24/2013   Superficial Liver masses, right lobe; suspected dermoid recurrence.   05/07/2013   Ovarian cystic mass 05/07/2013   s/p Serosal repair of colon 12/04/2012 12/15/2012   Herpes genitalis in women 12/07/2012   Anxiety state, unspecified 12/07/2012   GERD (gastroesophageal reflux disease) 12/07/2012   Generalized anxiety disorder 12/07/2012   Pelvic mass in  female s/p Robotic-assisted right salpingo-oophorectomy 12/04/2012   HEMORRHOIDS NOS, W/O COMPLICATIONS 97/98/9211   Depression 11/25/2006   Other allergic rhinitis 11/25/2006   ASTHMA, COUGH VARIANT 11/15/2006   Human immunodeficiency virus (HIV) disease ([place]) 11/10/2006   Urticaria 11/10/2006   Past Medical History:  Diagnosis Date   Allergy    Anemia    Anxiety    Anxiety state, unspecified 12/07/2012   Arthritis    Asthma    Bipolar disorder, unspecified ([place]) 12/07/2012   Depression    Diabetes mellitus without complication (HCC)    no meds currently- controlled with diet   GERD (gastroesophageal reflux disease)    GERD (gastroesophageal reflux disease) 12/07/2012   Headache(784.0)    migraines   Herpes genitalis in women 12/07/2012   HIV infection ([place])    Human immunodeficiency virus (HIV) disease ([place]) 12/07/2012   Neuromuscular disorder (HCC)    carpal tunnel in  both arms   SHINGLES 01/31/2009   Qualifier: Diagnosis of  By: Philipp Deputy MD, Tresa Endo     Type II or unspecified type diabetes mellitus without mention of complication, not stated as uncontrolled 12/07/2012   Unspecified asthma(493.90) 12/07/2012   Past Surgical History: Past Surgical History:  Procedure Laterality Date   CESAREAN SECTION     COLON SURGERY     colon repair after salpingo   INSERTION OF MESH N/A 11/06/2014   Procedure: INSERTION OF MESH;  Surgeon: Almond Lint, MD;  Location: [facility];  Service: General;  Laterality: N/A;   LAPAROSCOPIC HEPATECTOMY N/A 06/28/2013   Procedure: LAPAROSCOPIC EXCISION HEPATIC MASS;  Surgeon: Almond Lint, MD;  Location: [facility];  Service: General;  Laterality: N/A;   LAPAROSCOPIC LYSIS OF ADHESIONS N/A 12/04/2012   Procedure: LAPAROSCOPIC LYSIS OF ADHESIONS;  Surgeon: [name], MD;  Location: [facility];  Service: Gynecology;  Laterality: N/A;   LAPAROTOMY N/A 12/04/2012   Procedure: EXPLORATORY LAPAROTOMY;  Surgeon: [name], MD;  Location: [facility];  Service:  Gynecology;  Laterality: N/A;  repair of serosal injury   ROBOTIC ASSISTED BILATERAL SALPINGO OOPHERECTOMY Right 12/04/2012   Procedure: ROBOTIC ASSISTED Right  SALPINGO OOPHORECTOMY;  Surgeon: [name], MD;  Location: [facility];  Service: Gynecology;  Laterality: Right;   VENTRAL HERNIA REPAIR N/A 11/06/2014   Procedure: LAPAROSCOPIC ASSITED INCARCERATED VENTRAL HERNIA REPAIR WITH MESH;  Surgeon: Almond Lint, MD;  Location: [facility];  Service: General;  Laterality: N/A;   Medication List:  Current Outpatient Medications  Medication Sig Dispense Refill   albuterol (PROVENTIL) (2.5 MG/3ML) 0.083% nebulizer solution Take 3 mLs (2.5 mg total) by nebulization every 4 (four) hours as needed for wheezing or shortness of breath (coughing fits). 75 mL 2   albuterol (VENTOLIN HFA) 108 (90 Base) MCG/ACT inhaler Inhale 2 puffs into the lungs every 4 (four) hours as needed for wheezing or shortness of breath (coughing fits). 18 g 1   ARIPiprazole (ABILIFY) 10 MG tablet Take 10 mg by mouth every morning.     cabotegravir & rilpivirine ER (CABENUVA) 600 & 900 MG/3ML injection Inject 1 kit into the muscle every 2 (two) months. 6 mL 0   cetirizine (ZYRTEC) 10 MG tablet Take 2 tablets (20 mg total) by mouth at bedtime. 60 tablet 0   citalopram (CELEXA) 40 MG tablet Take 1 tablet (40 mg total) by mouth daily. 90 tablet 0   clindamycin (CLEOCIN T) 1 % external solution USE DAILY FOR PREVENTION OR 2 TIMES DAILY WHEN ACTIVE LESIONS     DENTA 5000 PLUS 1.1 % CREA dental cream USE SMALL AMOUNT ON TEETH AND GUMS EVERY EVENING SPIT OUT EXCESS AND DO NOT RINSE  3   diclofenac (VOLTAREN) 25 MG EC tablet Take 25 mg by mouth 3 (three) times daily.     Erenumab-aooe (AIMOVIG) 140 MG/ML SOAJ Inject 140 mg into the skin every 30 (thirty) days. 3 pen 3   Fluocinolone Acetonide Body 0.01 % OIL Apply to scalp once daily as needed for itching/rash.     FLUoxetine (PROZAC) 10 MG capsule Take 10 mg by mouth every morning.      FLUoxetine (PROZAC) 20 MG capsule Take 20 mg by mouth every morning.     fluticasone furoate-vilanterol (BREO ELLIPTA) 100-25 MCG/ACT AEPB Inhale 1 puff into the lungs daily. Rinse mouth after each use 60 each 3   gabapentin (NEURONTIN) 300 MG capsule Take 1 capsule (300 mg total) by mouth at bedtime. 30 capsule 5  Galcanezumab-gnlm (EMGALITY) 120 MG/ML SOAJ Inject 120 mg into the skin every 28 (twenty-eight) days. 1 pen 11   hydrocortisone 2.5 % cream Place rectally.     ketoconazole (NIZORAL) 2 % cream APPLY EXTERNALLY TO THE AFFECTED AREA EVERY DAY FOR 2 WEEKS     Levonorgestrel (KYLEENA IU) by Intrauterine route.     meloxicam (MOBIC) 15 MG tablet Take 1 tablet by mouth daily.     montelukast (SINGULAIR) 10 MG tablet TAKE 1 TABLET AT BEDTIME BY MOUTH. 90 tablet 1   OLANZapine (ZYPREXA) 2.5 MG tablet Take 2.5 mg by mouth at bedtime.     Olopatadine-Mometasone (RYALTRIS) [id number] MCG/ACT SUSP Place 2 sprays into both nostrils 2 (two) times daily. 29 g 5   omeprazole (PRILOSEC) 40 MG capsule daily.     omeprazole (PRILOSEC) 40 MG capsule Take 1 capsule by mouth daily.     ondansetron (ZOFRAN-ODT) 4 MG disintegrating tablet DISSOLVE 1 TABLET(4 MG) ON THE TONGUE EVERY 8 HOURS AS NEEDED FOR NAUSEA OR VOMITING 20 tablet 3   oxymetazoline (AFRIN) 0.05 % nasal spray Place 1 spray into both nostrils 2 (two) times daily. For <7 days at a time.     prazosin (MINIPRESS) 1 MG capsule Take 1 mg by mouth at bedtime.     QVAR REDIHALER 40 MCG/ACT inhaler INHALE 2 PUFFS INTO THE LUNGS TWICE DAILY 10.6 g 0   Rimegepant Sulfate (NURTEC) 75 MG TBDP Take 1 tablet by mouth daily as needed (Maximum 1 tablet in 24 hours). 8 tablet 11   spironolactone (ALDACTONE) 25 MG tablet Take 50 mg by mouth daily.     Tesamorelin Acetate 1 MG SOLR Inject 2 mg into the skin daily. 60 each 0   tiZANidine (ZANAFLEX) 4 MG tablet Take 4 mg by mouth 3 (three) times daily.     traZODone (DESYREL) 100 MG tablet Take 1 tablet (100 mg  total) by mouth at bedtime. Take 1-2 tablets for sleep 180 tablet 1   tretinoin (RETIN-A) 0.025 % cream SMARTSIG:Sparingly Topical Every Night PRN     triamcinolone cream (KENALOG) 0.1 % Apply 1 application topically 2 (two) times daily. 454 g 1   valACYclovir (VALTREX) 1000 MG tablet TAKE 1 TABLET(1000 MG) BY MOUTH DAILY 90 tablet 1   buPROPion (WELLBUTRIN XL) 300 MG 24 hr tablet Take 1 tablet (300 mg total) by mouth daily. 90 tablet 0   Phentermine-Topiramate 3.75-23 MG CP24 Take 1 tablet by mouth daily. 30 capsule 3   No current facility-administered medications for this visit.   Allergies: Allergies  Allergen Reactions   Latuda [Lurasidone Hcl] Other (See Comments)    Occular dystonia   Seroquel [Quetiapine Fumarate] Swelling    Tongue swells    Benzoin Other (See Comments)    blisters   Latex Rash and Other (See Comments)   Sulfamethoxazole-Trimethoprim Itching and Nausea And Vomiting   Social History: Social History   Socioeconomic History   Marital status: Single    Spouse name: Not on file   Number of children: 1   Years of education: 12   Highest education level: Some college, no degree  Occupational History   Occupation: Nutrition Chief Financial Officer: [employer]  Tobacco Use   Smoking status: Never   Smokeless tobacco: Never  Vaping Use   Vaping Use: Never used  Substance and Sexual Activity   Alcohol use: Yes    Alcohol/week: 0.0 standard drinks    Comment: OCC.about once a month  Drug use: No   Sexual activity: Never    Partners: Male    Birth control/protection: Implant  Other Topics Concern   Not on file  Social History Narrative   Patient lives alone.   Patient works at home.   Patient has hs education.   Patient has 1 child.   Patient drinks multiple energy drinks and coffees throughout day, daily.      Patient is right-handed. She lives with her daughter in a one level home. She drinks one cup of coffee a day. She works for [employer]  at [employer] in [place] and is very active at work.   Social Determinants of Health   Financial Resource Strain: Not on file  Food Insecurity: Not on file  Transportation Needs: Not on file  Physical Activity: Not on file  Stress: Not on file  Social Connections: Not on file   Lives in a 41 year old apt. 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 Charity fundraiser in the family room: no Carpet in the bedroom: yes Heating: electric Cooling: central Pet: yes 2 cats x 5 yrs, <1 yr  Family History: Family History  Problem Relation Age of Onset   Arthritis/Rheumatoid Mother    Hypertension Mother    Cancer Maternal Aunt        brand and lung   Cancer Maternal Grandmother        breasts   Cancer Paternal Grandmother        breast and kidney   Problem                               Relation Asthma                                   Mother, sister Allergic rhino conjunctivitis     mother, sister   Review of Systems  Constitutional:  Negative for appetite change, chills, fever and unexpected weight change.  HENT:  Positive for congestion and sneezing. Negative for rhinorrhea.   Eyes:  Negative for itching.  Respiratory:  Positive for cough, chest tightness and shortness of breath. Negative for wheezing.   Cardiovascular:  Negative for chest pain.  Gastrointestinal:  Negative for abdominal pain.  Genitourinary:  Negative for difficulty urinating.  Skin:  Negative for rash.  Allergic/Immunologic: Positive for environmental allergies.  Neurological:  Positive for headaches.   Objective: BP 126/84   Pulse 66   Temp 98 F (36.7 C)   Resp 18   Ht $R'5\' 4"'nm$  (1.626 m)   Wt 219 lb 6.4 oz (99.5 kg)   SpO2 98%   BMI 37.66 kg/m  Body mass index is 37.66 kg/m. Physical Exam Vitals and nursing note reviewed.  Constitutional:      Appearance: Normal appearance. She is well-developed.  HENT:     Head:  Normocephalic and atraumatic.     Right Ear: Tympanic membrane and external ear normal.     Left Ear: Tympanic membrane and external ear normal.     Nose: Nose normal.     Mouth/Throat:     Mouth: Mucous membranes are moist.     Pharynx: Oropharynx is clear.  Eyes:     Conjunctiva/sclera: Conjunctivae normal.  Cardiovascular:     Rate and Rhythm: Normal rate and regular rhythm.     Heart  sounds: Normal heart sounds. No murmur heard.   No friction rub. No gallop.  Pulmonary:     Effort: Pulmonary effort is normal.     Breath sounds: Normal breath sounds. No wheezing, rhonchi or rales.  Musculoskeletal:     Cervical back: Neck supple.  Skin:    General: Skin is warm.     Findings: No rash.  Neurological:     Mental Status: She is alert and oriented to person, place, and time.  Psychiatric:        Behavior: Behavior normal.  The plan was reviewed with the patient/family, and all questions/concerned were addressed.  It was my pleasure to see Nea today and participate in her care. Please feel free to contact me with any questions or concerns.  Sincerely,  Rexene Alberts, DO Allergy & Immunology  [facility] office: [place] office: [phone]  I have spent a total of 80 minutes of face-to-face and non-face-to-face time (excluding clinical staff time) preparing to see patient, documenting in the chart, reviewing notes and prior test results, ordering tests and/or medications, and counseling the patient on asthma, allergic rhinitis, frequent infections, atopic dermatitis, heartburn, urticaria.

## 2021-07-16 ENCOUNTER — Other Ambulatory Visit: Payer: Self-pay

## 2021-07-16 ENCOUNTER — Ambulatory Visit (INDEPENDENT_AMBULATORY_CARE_PROVIDER_SITE_OTHER): Payer: BC Managed Care – PPO | Admitting: Allergy

## 2021-07-16 ENCOUNTER — Encounter: Payer: Self-pay | Admitting: Allergy

## 2021-07-16 VITALS — BP 126/84 | HR 66 | Temp 98.0°F | Resp 18 | Ht 64.0 in | Wt 219.4 lb

## 2021-07-16 DIAGNOSIS — H1013 Acute atopic conjunctivitis, bilateral: Secondary | ICD-10-CM

## 2021-07-16 DIAGNOSIS — J3089 Other allergic rhinitis: Secondary | ICD-10-CM | POA: Diagnosis not present

## 2021-07-16 DIAGNOSIS — J4550 Severe persistent asthma, uncomplicated: Secondary | ICD-10-CM

## 2021-07-16 DIAGNOSIS — J329 Chronic sinusitis, unspecified: Secondary | ICD-10-CM | POA: Diagnosis not present

## 2021-07-16 DIAGNOSIS — R12 Heartburn: Secondary | ICD-10-CM

## 2021-07-16 DIAGNOSIS — L2089 Other atopic dermatitis: Secondary | ICD-10-CM

## 2021-07-16 DIAGNOSIS — L509 Urticaria, unspecified: Secondary | ICD-10-CM

## 2021-07-16 DIAGNOSIS — J45909 Unspecified asthma, uncomplicated: Secondary | ICD-10-CM

## 2021-07-16 MED ORDER — ALBUTEROL SULFATE HFA 108 (90 BASE) MCG/ACT IN AERS: 2.0000 | INHALATION_SPRAY | RESPIRATORY_TRACT | 1 refills | Status: AC | PRN

## 2021-07-16 MED ORDER — ALBUTEROL SULFATE (2.5 MG/3ML) 0.083% IN NEBU: 2.5000 mg | INHALATION_SOLUTION | RESPIRATORY_TRACT | 2 refills | Status: AC | PRN

## 2021-07-16 MED ORDER — FLUTICASONE FUROATE-VILANTEROL 100-25 MCG/ACT IN AEPB: 1.0000 | INHALATION_SPRAY | Freq: Every day | RESPIRATORY_TRACT | 3 refills | Status: DC

## 2021-07-16 MED ORDER — RYALTRIS 665-25 MCG/ACT NA SUSP: 2.0000 | Freq: Two times a day (BID) | NASAL | 5 refills | Status: DC

## 2021-07-16 NOTE — Patient Instructions (Addendum)
Today's skin testing showed: Positive to trees, cat and horse. Borderline to milk. Results given.  Asthma: Daily controller medication(s): start Breo 163mcg 1 puff daily and rinse mouth after each use. Demonstrated proper use. Continue Singulair (montelukast) 10mg  daily at night. During upper respiratory infections/asthma flares:  Start Qvar 27mcg 2 puffs twice a day for 1-2 weeks until your breathing symptoms return to baseline.  Pretreat with albuterol 2 puffs or albuterol nebulizer.  If you need to use your albuterol nebulizer machine back to back within 15-30 minutes with no relief then please go to the ER/urgent care for further evaluation.  May use albuterol rescue inhaler 2 puffs every 4 to 6 hours as needed for shortness of breath, chest tightness, coughing, and wheezing. May use albuterol rescue inhaler 2 puffs 5 to 15 minutes prior to strenuous physical activities. Monitor frequency of use.  Asthma control goals:  Full participation in all desired activities (may need albuterol before activity) Albuterol use two times or less a week on average (not counting use with activity) Cough interfering with sleep two times or less a month Oral steroids no more than once a year No hospitalizations   Environmental allergies Start environmental control measures as below. Continue Singulair (montelukast) 10mg  daily at night. Use over the counter antihistamines such as Zyrtec (cetirizine), Claritin (loratadine), Allegra (fexofenadine), or Xyzal (levocetirizine) daily as needed. May take twice a day during allergy flares. May switch antihistamines every few months. Start Ryaltris (olopatadine + mometasone nasal spray combination) 1-2 sprays per nostril twice a day. Sample given. Consider allergy injections for long term control if above medications do not help the symptoms - handout given.   Infections Keep track of infections and antibiotics. Get bloodwork We are ordering labs, so please  allow 1-2 weeks for the results to come back. With the newly implemented Cures Act, the labs might be visible to you at the same time that they become visible to me. However, I will not address the results until all of the results are back, so please be patient.  In the meantime, continue recommendations in your patient instructions, including avoidance measures (if applicable), until you hear from me. Hives Monitor symptoms and take pictures.  Get bloodwork.  Eczema See below for proper skin care.  Heartburn: See handout for lifestyle and dietary modifications. Continue omeprazole daily and nothing to eat/drink for 30 minutes afterwards.   Follow up in 2 months or sooner if needed.    Skin care recommendations  Bath time: Always use lukewarm water. AVOID very hot or cold water. Keep bathing time to 5-10 minutes. Do NOT use bubble bath. Use a mild soap and use just enough to wash the dirty areas. Do NOT scrub skin vigorously.  After bathing, pat dry your skin with a towel. Do NOT rub or scrub the skin.  Moisturizers and prescriptions:  ALWAYS apply moisturizers immediately after bathing (within 3 minutes). This helps to lock-in moisture. Use the moisturizer several times a day over the whole body. Good summer moisturizers include: Aveeno, CeraVe, Cetaphil. Good winter moisturizers include: Aquaphor, Vaseline, Cerave, Cetaphil, Eucerin, Vanicream. When using moisturizers along with medications, the moisturizer should be applied about one hour after applying the medication to prevent diluting effect of the medication or moisturize around where you applied the medications. When not using medications, the moisturizer can be continued twice daily as maintenance.  Laundry and clothing: Avoid laundry products with added color or perfumes. Use unscented hypo-allergenic laundry products such as Tide free, Cheer  free & gentle, and All free and clear.  If the skin still seems dry or  sensitive, you can try double-rinsing the clothes. Avoid tight or scratchy clothing such as wool. Do not use fabric softeners or dyer sheets. r

## 2021-07-20 DIAGNOSIS — H1013 Acute atopic conjunctivitis, bilateral: Secondary | ICD-10-CM | POA: Insufficient documentation

## 2021-07-20 DIAGNOSIS — L2089 Other atopic dermatitis: Secondary | ICD-10-CM | POA: Insufficient documentation

## 2021-07-20 DIAGNOSIS — R12 Heartburn: Secondary | ICD-10-CM | POA: Insufficient documentation

## 2021-07-20 NOTE — Assessment & Plan Note (Signed)
Diagnosed with asthma 30 years ago.  Currently using Singulair, Qvar 40 mcg 2 puffs twice a day and albuterol as needed with some benefit.  Today's spirometry showed some restriction with 13% improvement in FEV1 post bronchodilator treatment.  Clinically feeling unchanged.  Today's skin prick testing was positive to trees, cat and horse.  Borderline to milk. . Daily controller medication(s): start Breo 144mcg 1 puff daily and rinse mouth after each use. Demonstrated proper use. . Continue Singulair (montelukast) 10mg  daily at night. . During upper respiratory infections/asthma flares:  o Start Qvar 43mcg 2 puffs twice a day for 1-2 weeks until your breathing symptoms return to baseline.  o Pretreat with albuterol 2 puffs or albuterol nebulizer.  o If you need to use your albuterol nebulizer machine back to back within 15-30 minutes with no relief then please go to the ER/urgent care for further evaluation.  . May use albuterol rescue inhaler 2 puffs every 4 to 6 hours as needed for shortness of breath, chest tightness, coughing, and wheezing. May use albuterol rescue inhaler 2 puffs 5 to 15 minutes prior to strenuous physical activities. Monitor frequency of use.   Get spirometry at next visit.

## 2021-07-20 NOTE — Assessment & Plan Note (Signed)
Eczema since birth and acidic foods/spicy foods and milk flare her eczema. . See below for proper skin care.

## 2021-07-20 NOTE — Assessment & Plan Note (Signed)
   See handout for lifestyle and dietary modifications.  Continue omeprazole daily and nothing to eat/drink for 30 minutes afterwards.

## 2021-07-20 NOTE — Assessment & Plan Note (Signed)
Frequent sinus infections requiring 2-3 antibiotics per year.  Patient was diagnosed with HIV in 2008 and is followed by infectious disease. Marland Kitchen Keep track of infections and antibiotics. . Get bloodwork as below. . Discussed with patient that her HIV status and environmental allergies are 2 factors that may also predispose her to frequent infections.

## 2021-07-20 NOTE — Assessment & Plan Note (Signed)
Perennial rhinoconjunctivitis symptoms for 25+ years with worsening in the spring.  Tried over-the-counter antihistamines, Flonase and ipratropium with some benefit.  2020 testing showed multiple positives per patient report.  No prior AIT.  ENT evaluation done.  No prior sinus surgery.  Today's skin prick testing showed: Positive to trees, cat and horse.  Get bloodwork instead of intradermal testing as already getting bloodwork as below.   Start environmental control measures as below.  Continue Singulair (montelukast) 10mg  daily at night.  Use over the counter antihistamines such as Zyrtec (cetirizine), Claritin (loratadine), Allegra (fexofenadine), or Xyzal (levocetirizine) daily as needed. May take twice a day during allergy flares. May switch antihistamines every few months. . Start Ryaltris (olopatadine + mometasone nasal spray combination) 1-2 sprays per nostril twice a day. Sample given. . Consider allergy injections for long term control if above medications do not help the symptoms - handout given.

## 2021-07-20 NOTE — Assessment & Plan Note (Signed)
No triggers noted. . Monitor symptoms and take pictures.  . Get bloodwork.

## 2021-07-24 LAB — STREP PNEUMONIAE 23 SEROTYPES IGG
Pneumo Ab Type 1*: 0.3 ug/mL — ABNORMAL LOW (ref >1.3)
Pneumo Ab Type 12 (12F)*: <0.1 ug/mL — ABNORMAL LOW (ref >1.3)
Pneumo Ab Type 14*: 0.8 ug/mL — ABNORMAL LOW (ref >1.3)
Pneumo Ab Type 17 (17F)*: 0.3 ug/mL — ABNORMAL LOW (ref >1.3)
Pneumo Ab Type 19 (19F)*: 0.3 ug/mL — ABNORMAL LOW (ref >1.3)
Pneumo Ab Type 2*: 0.4 ug/mL — ABNORMAL LOW (ref >1.3)
Pneumo Ab Type 20*: 0.3 ug/mL — ABNORMAL LOW (ref >1.3)
Pneumo Ab Type 22 (22F)*: 0.1 ug/mL — ABNORMAL LOW (ref >1.3)
Pneumo Ab Type 23 (23F)*: <0.1 ug/mL — ABNORMAL LOW (ref >1.3)
Pneumo Ab Type 26 (6B)*: <0.1 ug/mL — ABNORMAL LOW (ref >1.3)
Pneumo Ab Type 3*: 0.7 ug/mL — ABNORMAL LOW (ref >1.3)
Pneumo Ab Type 34 (10A)*: 0.1 ug/mL — ABNORMAL LOW (ref >1.3)
Pneumo Ab Type 4*: 0.2 ug/mL — ABNORMAL LOW (ref >1.3)
Pneumo Ab Type 43 (11A)*: <0.1 ug/mL — ABNORMAL LOW (ref >1.3)
Pneumo Ab Type 5*: 1 ug/mL — ABNORMAL LOW (ref >1.3)
Pneumo Ab Type 51 (7F)*: 0.1 ug/mL — ABNORMAL LOW (ref >1.3)
Pneumo Ab Type 54 (15B)*: 0.1 ug/mL — ABNORMAL LOW (ref >1.3)
Pneumo Ab Type 56 (18C)*: 0.3 ug/mL — ABNORMAL LOW (ref >1.3)
Pneumo Ab Type 57 (19A)*: 0.5 ug/mL — ABNORMAL LOW (ref >1.3)
Pneumo Ab Type 68 (9V)*: 0.1 ug/mL — ABNORMAL LOW (ref >1.3)
Pneumo Ab Type 70 (33F)*: 0.1 ug/mL — ABNORMAL LOW (ref >1.3)
Pneumo Ab Type 8*: <0.1 ug/mL — ABNORMAL LOW (ref >1.3)
Pneumo Ab Type 9 (9N)*: <0.1 ug/mL — ABNORMAL LOW (ref >1.3)

## 2021-07-24 LAB — ALLERGENS W/TOTAL IGE AREA 2
Alternaria Alternata IgE: <0.1 kU/L
Aspergillus Fumigatus IgE: <0.1 kU/L
Bermuda Grass IgE: <0.1 kU/L
Cat Dander IgE: 17.6 kU/L — AB
Cedar, Mountain IgE: <0.1 kU/L
Cladosporium Herbarum IgE: <0.1 kU/L
Cockroach, German IgE: 0.12 kU/L — AB
Common Silver Birch IgE: <0.1 kU/L
Cottonwood IgE: <0.1 kU/L
D Farinae IgE: <0.1 kU/L
D Pteronyssinus IgE: <0.1 kU/L
Dog Dander IgE: 6.38 kU/L — AB
Elm, American IgE: <0.1 kU/L
Johnson Grass IgE: <0.1 kU/L
Maple/Box Elder IgE: <0.1 kU/L
Mouse Urine IgE: 0.5 kU/L — AB
Oak, White IgE: <0.1 kU/L
Pecan, Hickory IgE: <0.1 kU/L
Penicillium Chrysogen IgE: <0.1 kU/L
Pigweed, Rough IgE: <0.1 kU/L
Ragweed, Short IgE: <0.1 kU/L
Sheep Sorrel IgE Qn: <0.1 kU/L
Timothy Grass IgE: <0.1 kU/L
White Mulberry IgE: <0.1 kU/L

## 2021-07-24 LAB — C3 AND C4
Complement C3, Serum: 136 mg/dL (ref 82–167)
Complement C4, Serum: 32 mg/dL (ref 12–38)

## 2021-07-24 LAB — COMPREHENSIVE METABOLIC PANEL
ALT: 18 IU/L (ref 0–32)
AST: 16 IU/L (ref 0–40)
Albumin/Globulin Ratio: 1.7 (ref 1.2–2.2)
Albumin: 4 g/dL (ref 3.8–4.8)
Alkaline Phosphatase: 82 IU/L (ref 44–121)
BUN/Creatinine Ratio: 19 (ref 9–23)
BUN: 18 mg/dL (ref 6–24)
Bilirubin Total: 0.4 mg/dL (ref 0.0–1.2)
CO2: 24 mmol/L (ref 20–29)
Calcium: 9.5 mg/dL (ref 8.7–10.2)
Chloride: 103 mmol/L (ref 96–106)
Creatinine, Ser: 0.97 mg/dL (ref 0.57–1.00)
Globulin, Total: 2.4 g/dL (ref 1.5–4.5)
Glucose: 113 mg/dL — ABNORMAL HIGH (ref 70–99)
Potassium: 4.2 mmol/L (ref 3.5–5.2)
Sodium: 140 mmol/L (ref 134–144)
Total Protein: 6.4 g/dL (ref 6.0–8.5)
eGFR: 75 mL/min/{1.73_m2} (ref >59)

## 2021-07-24 LAB — THYROID CASCADE PROFILE: TSH: 1.23 u[IU]/mL (ref 0.450–4.500)

## 2021-07-24 LAB — IGE MILK W/ COMPONENT REFLEX: F002-IgE Milk: 0.17 kU/L — AB

## 2021-07-24 LAB — CBC WITH DIFFERENTIAL/PLATELET
Basophils Absolute: 0.1 10*3/uL (ref 0.0–0.2)
Basos: 1 %
EOS (ABSOLUTE): 0.2 10*3/uL (ref 0.0–0.4)
Eos: 2 %
Hematocrit: 39.2 % (ref 34.0–46.6)
Hemoglobin: 13.6 g/dL (ref 11.1–15.9)
Immature Grans (Abs): 0 10*3/uL (ref 0.0–0.1)
Immature Granulocytes: 0 %
Lymphocytes Absolute: 3.6 10*3/uL — ABNORMAL HIGH (ref 0.7–3.1)
Lymphs: 41 %
MCH: 32 pg (ref 26.6–33.0)
MCHC: 34.7 g/dL (ref 31.5–35.7)
MCV: 92 fL (ref 79–97)
Monocytes Absolute: 0.5 10*3/uL (ref 0.1–0.9)
Monocytes: 6 %
Neutrophils Absolute: 4.4 10*3/uL (ref 1.4–7.0)
Neutrophils: 50 %
Platelets: 340 10*3/uL (ref 150–450)
RBC: 4.25 x10E6/uL (ref 3.77–5.28)
RDW: 12.2 % (ref 11.7–15.4)
WBC: 8.8 10*3/uL (ref 3.4–10.8)

## 2021-07-24 LAB — IGG, IGA, IGM
IgA/Immunoglobulin A, Serum: 148 mg/dL (ref 87–352)
IgG (Immunoglobin G), Serum: 979 mg/dL (ref 586–1602)
IgM (Immunoglobulin M), Srm: 72 mg/dL (ref 26–217)

## 2021-07-24 LAB — TRYPTASE: Tryptase: 3.8 ug/L (ref 2.2–13.2)

## 2021-07-24 LAB — ALPHA-GAL PANEL
Allergen Lamb IgE: <0.1 kU/L
Beef IgE: <0.1 kU/L
IgE (Immunoglobulin E), Serum: 69 IU/mL (ref 6–495)
O215-IgE Alpha-Gal: <0.1 kU/L
Pork IgE: <0.1 kU/L

## 2021-07-24 LAB — ALPHA-1-ANTITRYPSIN: A-1 Antitrypsin: 163 mg/dL (ref 101–187)

## 2021-07-24 LAB — DIPHTHERIA / TETANUS ANTIBODY PANEL
Diphtheria Ab: <0.1 IU/mL — ABNORMAL LOW (ref <0.10)
Tetanus Ab, IgG: 0.18 IU/mL (ref <0.10)

## 2021-07-24 LAB — ANA W/REFLEX: Anti Nuclear Antibody (ANA): NEGATIVE

## 2021-07-24 LAB — C-REACTIVE PROTEIN: CRP: <1 mg/L (ref 0–10)

## 2021-07-24 LAB — CHRONIC URTICARIA: cu index: <1 (ref <10)

## 2021-07-24 LAB — SEDIMENTATION RATE: Sed Rate: 4 mm/hr (ref 0–32)

## 2021-08-20 ENCOUNTER — Ambulatory Visit (INDEPENDENT_AMBULATORY_CARE_PROVIDER_SITE_OTHER): Payer: BC Managed Care – PPO | Admitting: Infectious Diseases

## 2021-08-20 ENCOUNTER — Other Ambulatory Visit: Payer: Self-pay

## 2021-08-20 VITALS — BP 112/80 | HR 82 | Resp 16 | Ht 64.0 in | Wt 220.0 lb

## 2021-08-20 DIAGNOSIS — E785 Hyperlipidemia, unspecified: Secondary | ICD-10-CM | POA: Diagnosis not present

## 2021-08-20 DIAGNOSIS — R87612 Low grade squamous intraepithelial lesion on cytologic smear of cervix (LGSIL): Secondary | ICD-10-CM

## 2021-08-20 DIAGNOSIS — F3162 Bipolar disorder, current episode mixed, moderate: Secondary | ICD-10-CM | POA: Diagnosis not present

## 2021-08-20 DIAGNOSIS — J3089 Other allergic rhinitis: Secondary | ICD-10-CM

## 2021-08-20 DIAGNOSIS — E669 Obesity, unspecified: Secondary | ICD-10-CM

## 2021-08-20 DIAGNOSIS — R739 Hyperglycemia, unspecified: Secondary | ICD-10-CM

## 2021-08-20 DIAGNOSIS — Z79899 Other long term (current) drug therapy: Secondary | ICD-10-CM

## 2021-08-20 DIAGNOSIS — B20 Human immunodeficiency virus [HIV] disease: Secondary | ICD-10-CM | POA: Diagnosis not present

## 2021-08-20 DIAGNOSIS — Z113 Encounter for screening for infections with a predominantly sexual mode of transmission: Secondary | ICD-10-CM | POA: Diagnosis not present

## 2021-08-20 DIAGNOSIS — Z6837 Body mass index (BMI) 37.0-37.9, adult: Secondary | ICD-10-CM

## 2021-08-20 MED ORDER — CABOTEGRAVIR & RILPIVIRINE ER 600 & 900 MG/3ML IM SUER
1.0000 | Freq: Once | INTRAMUSCULAR | Status: AC
  Administered 2021-08-20: 1 via INTRAMUSCULAR

## 2021-08-20 NOTE — Addendum Note (Signed)
Addended by: Primo Innis C on: 08/20/2021 04:03 PM   Modules accepted: Orders

## 2021-08-20 NOTE — Assessment & Plan Note (Signed)
She has new intake next month. She feels she is stable.

## 2021-08-20 NOTE — Assessment & Plan Note (Signed)
Had normal PAP 06-2021 Will cont to f/u with her GYN.

## 2021-08-20 NOTE — Assessment & Plan Note (Signed)
I suggested she try flonase.  She will speak with pharmacy about this.

## 2021-08-20 NOTE — Progress Notes (Signed)
° °  Subjective:    Patient ID: [name], female  DOB: May 31, 1981, 41 y.o.        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 Normal PAP 07-2019 at [facility]. Had 07-2021, was told everything was (-).  Currently on cabaneuva since 04-2021, prev biktarvy, atripla- no problems with ART, today is her 3rd injection. Will get labs today.   Has regular f/u at behavioral health --> psychiatry (PTSD, insomnia). Has new appt 08-24-21 here in town.   Was started on statin recently by her PCP.   Will need some medical info transferred to [place].    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  Saw wt loss dr 2 days ago. Has changed her diet, was started on simeglutide (A1C was up a little per pt).    HIV 1 RNA Quant (Copies/mL)  Date Value  06/18/2021 30 (H)  02/06/2021 <20 (H)  05/15/2020 <20   CD4 T Cell Abs (/uL)  Date Value  05/15/2020 1,521  08/28/2019 1,357  04/04/2019 1,560     Health Maintenance  Topic Date Due   FOOT EXAM  Never done   OPHTHALMOLOGY EXAM  Never done   PAP SMEAR-Modifier  09/22/2019   HEMOGLOBIN A1C  08/08/2021   TETANUS/TDAP  08/06/2031   Pneumococcal Vaccine 27-68 Years old (4 - PPSV23 if available, else PCV20) 05/23/2045   COVID-19 Vaccine  Completed   Hepatitis C Screening  Completed   HIV Screening  Completed   HPV VACCINES  Aged Out      Review of Systems  Constitutional:  Negative for chills, fever and weight loss.  HENT:         Post nasal drip  Respiratory:  Positive for cough. Negative for shortness of breath.   Gastrointestinal:  Negative for constipation and diarrhea.  Genitourinary:  Negative for dysuria.       Irregular menses with kylena   Please see HPI. All other systems reviewed and negative.     Objective:  Physical Exam Vitals reviewed.  Constitutional:      Appearance: Normal appearance. She is obese.  HENT:     Mouth/Throat:     Mouth: Mucous  membranes are moist.     Pharynx: No posterior oropharyngeal erythema.  Eyes:     Extraocular Movements: Extraocular movements intact.     Pupils: Pupils are equal, round, and reactive to light.  Cardiovascular:     Rate and Rhythm: Normal rate and regular rhythm.  Pulmonary:     Effort: Pulmonary effort is normal.     Breath sounds: Normal breath sounds.  Abdominal:     General: Bowel sounds are normal. There is no distension.     Palpations: Abdomen is soft.     Tenderness: There is no abdominal tenderness.  Musculoskeletal:        General: Normal range of motion.     Cervical back: Normal range of motion and neck supple.     Right lower leg: No edema.     Left lower leg: No edema.  Neurological:     Mental Status: She is alert.  Psychiatric:        Mood and Affect: Mood normal.           Assessment & Plan:

## 2021-08-20 NOTE — Assessment & Plan Note (Signed)
She is doing well Repeat labs today.  Offered/refused condoms.  Flu, COVID, PCV 23 all in the last month.  No change in her art Will see in back in 2 months for f/u of labs.

## 2021-08-20 NOTE — Addendum Note (Signed)
Addended by: Theresia Majors A on: 08/20/2021 04:56 PM   Modules accepted: Orders

## 2021-08-20 NOTE — Assessment & Plan Note (Signed)
Waiting to see if semaglutide covered by her ins

## 2021-08-20 NOTE — Assessment & Plan Note (Signed)
encouraged wt loss She will cont to f/u with bariatric clinic.

## 2021-08-20 NOTE — Assessment & Plan Note (Signed)
The 10-year ASCVD risk score (Arnett DK, et al., 2019) is: 1.7%   Values used to calculate the score:     Age: 41 years     Sex: Female     Is Non-Hispanic African American: Yes     Diabetic: Yes     Tobacco smoker: No     Systolic Blood Pressure: 419 mmHg     Is BP treated: No     HDL Cholesterol: 40 MG/DL     Total Cholesterol: 206 MG/DL   Test no longer valid as she is on statin via pcp Will watch her LFTs and lipids.

## 2021-08-21 ENCOUNTER — Other Ambulatory Visit (HOSPITAL_COMMUNITY)
Admission: RE | Admit: 2021-08-21 | Discharge: 2021-08-21 | Disposition: A | Discharge status: Home / Self Care | Payer: BC Managed Care – PPO | Source: Ambulatory Visit | Attending: Infectious Diseases | Admitting: Infectious Diseases

## 2021-08-21 DIAGNOSIS — Z113 Encounter for screening for infections with a predominantly sexual mode of transmission: Secondary | ICD-10-CM | POA: Insufficient documentation

## 2021-08-21 LAB — T-HELPER CELL (CD4) - (RCID CLINIC ONLY)
CD4 % Helper T Cell: 37 % (ref 33–65)
CD4 T Cell Abs: 1434 /uL (ref 400–1790)

## 2021-08-22 LAB — URINE CYTOLOGY ANCILLARY ONLY
Chlamydia: NEGATIVE
Comment: NEGATIVE
Comment: NORMAL
Neisseria Gonorrhea: NEGATIVE

## 2021-08-23 LAB — CBC
HCT: 41.8 % (ref 35.0–45.0)
Hemoglobin: 14.3 g/dL (ref 11.7–15.5)
MCH: 32.1 pg (ref 27.0–33.0)
MCHC: 34.2 g/dL (ref 32.0–36.0)
MCV: 93.7 fL (ref 80.0–100.0)
MPV: 10.7 fL (ref 7.5–12.5)
Platelets: 339 10*3/uL (ref 140–400)
RBC: 4.46 10*6/uL (ref 3.80–5.10)
RDW: 12.1 % (ref 11.0–15.0)
WBC: 9.6 10*3/uL (ref 3.8–10.8)

## 2021-08-23 LAB — COMPREHENSIVE METABOLIC PANEL
AG Ratio: 1.6 (calc) (ref 1.0–2.5)
ALT: 18 U/L (ref 6–29)
AST: 17 U/L (ref 10–30)
Albumin: 4.3 g/dL (ref 3.6–5.1)
Alkaline phosphatase (APISO): 110 U/L (ref 31–125)
BUN/Creatinine Ratio: 20 (calc) (ref 6–22)
BUN: 21 mg/dL (ref 7–25)
CO2: 30 mmol/L (ref 20–32)
Calcium: 9.7 mg/dL (ref 8.6–10.2)
Chloride: 103 mmol/L (ref 98–110)
Creat: 1.07 mg/dL — ABNORMAL HIGH (ref 0.50–0.99)
Globulin: 2.7 g/dL (calc) (ref 1.9–3.7)
Glucose, Bld: 83 mg/dL (ref 65–99)
Potassium: 4.3 mmol/L (ref 3.5–5.3)
Sodium: 140 mmol/L (ref 135–146)
Total Bilirubin: 0.5 mg/dL (ref 0.2–1.2)
Total Protein: 7 g/dL (ref 6.1–8.1)

## 2021-08-23 LAB — RPR: RPR Ser Ql: NONREACTIVE

## 2021-08-23 LAB — HIV-1 RNA QUANT-NO REFLEX-BLD
HIV 1 RNA Quant: 21 Copies/mL — ABNORMAL HIGH
HIV-1 RNA Quant, Log: 1.31 Log cps/mL — ABNORMAL HIGH

## 2021-08-27 ENCOUNTER — Ambulatory Visit: Payer: BC Managed Care – PPO | Admitting: Infectious Diseases

## 2021-09-27 NOTE — Progress Notes (Signed)
Follow Up Note  RE: [name] MRN: [id number] DOB: [birth year] Date of Office Visit: 09/28/2021  Referring provider: Gerald Leitz Primary care provider: Andria Frames, PA-C  Chief Complaint: Asthma (It ok. Insurance stopped covering the [employer] inhaler.) and Allergic Rhinitis  (The same)  History of Present Illness: I had the pleasure of seeing [name] for a follow up visit at the [facility] on 09/28/2021. She is a 41 y.o. female, who is being followed for asthma, allergic rhinoconjunctivitis, frequent sinus infections, urticaria, atopic dermatitis and GERD. Her previous allergy office visit was on 07/16/2021 with Dr. Maudie Mercury. Today is a regular follow up visit.  Asthma Patient was only on Breo for 1 month as insurance stopped covering it. Currently on Qvar 93mcg 2 puffs once a day and not sure if helping.   Gets shortness of breath mainly at work. Last used albuterol was 2 weeks ago.  Had URI in January and was treated with antibiotics and prednisone. Still having a lot of PND.  Still taking Singulair at night.  Allergic rhinitis Some itchy nose and watery eyes. Tried Ryaltris and unsure if it helped as she got a sinus infection afterwards and she did not refill the nasal spray.  Willing to try again.    Frequent sinus infections 1 sinus infection since the last visit.  She got pneumonia vaccine - needs post titer bloodwork.   Heartburn Not on any meds but noticed symptoms flaring.   Assessment and Plan: [name] is a 41 y.o. female with: Not well controlled moderate persistent asthma Past history - Diagnosed with asthma 30 years ago.  Currently using Singulair, Qvar 40 mcg 2 puffs twice a day and albuterol as needed with some benefit. 2022 spirometry showed some restriction with 13% improvement in FEV1 post bronchodilator treatment.  Clinically feeling unchanged. Interim history - Used Breo x 1 month only due to insurance coverage.  Not sure if it helped. Needed prednisone during recent URI. Today's spirometry was normal. Daily controller medication(s): start Airduo 254mcg 1 puff twice a day. Sample given. Demonstrated proper use. Let me know if too expensive.  Continue Singulair (montelukast) 10mg  daily at night. During upper respiratory infections/asthma flares:  Start Qvar 9mcg 2 puffs twice a day for 1-2 weeks until your breathing symptoms return to baseline.  Pretreat with albuterol 2 puffs or albuterol nebulizer.  If you need to use your albuterol nebulizer machine back to back within 15-30 minutes with no relief then please go to the ER/urgent care for further evaluation.  May use albuterol rescue inhaler 2 puffs or nebulizer every 4 to 6 hours as needed for shortness of breath, chest tightness, coughing, and wheezing. May use albuterol rescue inhaler 2 puffs 5 to 15 minutes prior to strenuous physical activities. Monitor frequency of use.  Get spirometry at next visit.  Seasonal and perennial allergic rhinoconjunctivitis Past history - Perennial rhinoconjunctivitis symptoms for 25+ years with worsening in the spring.  Tried over-the-counter antihistamines, Flonase and ipratropium with some benefit.  2020 testing showed multiple positives per patient report.  No prior AIT.  ENT evaluation done.  No prior sinus surgery. 2022 skin prick testing showed: Positive to trees, cat and horse. Interim history - 2022 bloodwork positive to cat, dog and borderline to mouse. Tried Ryaltris sample only. Not sure if helpful as she got a sinus infection after the last visit.  Continue environmental control measures as below. Continue Singulair (montelukast) 10mg  daily at night.  Use over the counter antihistamines such as Zyrtec (cetirizine), Claritin (loratadine), Allegra (fexofenadine), or Xyzal (levocetirizine) daily as needed. May take twice a day during allergy flares. May switch antihistamines every few months. Start Ryaltris  (olopatadine + mometasone nasal spray combination) 1-2 sprays per nostril twice a day. Sample given. Let me know if too expensive.  Consider allergy injections for long term control if above medications do not help the symptoms.   Frequent sinus infections Past history - Frequent sinus infections requiring 2-3 antibiotics per year.  Patient was diagnosed with HIV in 2008 and is followed by infectious disease. Interim history - 1 infection and antibiotics since last visit. Got pneumonia vaccine.  Keep track of infections and antibiotics. Get bloodwork to look at pneumococcal titer response. Discussed with patient that her HIV status and environmental allergies are 2 factors that may also predispose her to frequent infections.   GERD (gastroesophageal reflux disease) Not on PPI, increased symptoms. Continue lifestyle and dietary modifications. Restart  omeprazole $RemoveBef'40mg'pVdreomAGO$  daily in the morning and nothing to eat/drink for 30 minutes afterwards.   Urticaria 2022 Bloodwork (CBC diff, CMP, TSH, ANA, ESR, CRP, CU, tryptase, Alpha gal) all normal.  Monitor symptoms.   Other atopic dermatitis Continue proper skin care.  Return in about 2 months (around 11/26/2021).  Meds ordered this encounter  Medications   omeprazole (PRILOSEC) 40 MG capsule    Sig: Take 1 capsule (40 mg total) by mouth daily. Nothing to eat or drink for 20-30 minutes.    Dispense:  30 capsule    Refill:  2   azelastine (OPTIVAR) 0.05 % ophthalmic solution    Sig: Place 1 drop into both eyes 2 (two) times daily as needed (itchy/watery eyes).    Dispense:  6 mL    Refill:  2   Olopatadine-Mometasone (RYALTRIS) 665-25 MCG/ACT SUSP    Sig: Place 1-2 sprays into the nose in the morning and at bedtime.    Dispense:  29 g    Refill:  5    [phone]   Fluticasone-Salmeterol,sensor, (AIRDUO DIGIHALER) 232-14 MCG/ACT AEPB    Sig: Inhale 1 puff into the lungs in the morning and at bedtime. Rinse mouth after each use.     Dispense:  1 each    Refill:  3    [phone]   Lab Orders         Diphtheria / Tetanus Antibody Panel         Strep pneumoniae 23 Serotypes IgG      Diagnostics: Spirometry:  Tracings reviewed. Her effort: Good reproducible efforts. FVC: 2.44L FEV1: 1.97L, 77% predicted FEV1/FVC ratio: 81% Interpretation: Spirometry consistent with normal pattern.  Please see scanned spirometry results for details.  Medication List:  Current Outpatient Medications  Medication Sig Dispense Refill   albuterol (PROVENTIL) (2.5 MG/3ML) 0.083% nebulizer solution Take 3 mLs (2.5 mg total) by nebulization every 4 (four) hours as needed for wheezing or shortness of breath (coughing fits). 75 mL 2   albuterol (VENTOLIN HFA) 108 (90 Base) MCG/ACT inhaler Inhale 2 puffs into the lungs every 4 (four) hours as needed for wheezing or shortness of breath (coughing fits). 18 g 1   amphetamine-dextroamphetamine (ADDERALL XR) 30 MG 24 hr capsule Take by mouth.     azelastine (OPTIVAR) 0.05 % ophthalmic solution Place 1 drop into both eyes 2 (two) times daily as needed (itchy/watery eyes). 6 mL 2   cabotegravir & rilpivirine ER (CABENUVA) 600 & 900 MG/3ML injection Inject 1 kit into the  muscle every 2 (two) months. 6 mL 0   clindamycin (CLEOCIN T) 1 % external solution USE DAILY FOR PREVENTION OR 2 TIMES DAILY WHEN ACTIVE LESIONS     DENTA 5000 PLUS 1.1 % CREA dental cream USE SMALL AMOUNT ON TEETH AND GUMS EVERY EVENING SPIT OUT EXCESS AND DO NOT RINSE  3   Fluocinolone Acetonide Body 0.01 % OIL Apply to scalp once daily as needed for itching/rash.     FLUoxetine (PROZAC) 40 MG capsule Take 40 mg by mouth daily.     Fluticasone-Salmeterol,sensor, (AIRDUO DIGIHALER) 232-14 MCG/ACT AEPB Inhale 1 puff into the lungs in the morning and at bedtime. Rinse mouth after each use. 1 each 3   hydrocortisone 2.5 % cream Place rectally.     Levonorgestrel (KYLEENA IU) by Intrauterine route.     montelukast (SINGULAIR) 10 MG  tablet TAKE 1 TABLET AT BEDTIME BY MOUTH. 90 tablet 1   Olopatadine-Mometasone (RYALTRIS) 665-25 MCG/ACT SUSP Place 1-2 sprays into the nose in the morning and at bedtime. 29 g 5   oxymetazoline (AFRIN) 0.05 % nasal spray Place 1 spray into both nostrils 2 (two) times daily. For <7 days at a time.     QVAR REDIHALER 40 MCG/ACT inhaler INHALE 2 PUFFS INTO THE LUNGS TWICE DAILY 10.6 g 0   rosuvastatin (CRESTOR) 20 MG tablet Take by mouth.     tiZANidine (ZANAFLEX) 4 MG tablet Take 4 mg by mouth 3 (three) times daily.     triamcinolone cream (KENALOG) 0.1 % Apply 1 application topically 2 (two) times daily. 454 g 1   valACYclovir (VALTREX) 1000 MG tablet TAKE 1 TABLET(1000 MG) BY MOUTH DAILY 90 tablet 1   cetirizine (ZYRTEC) 10 MG tablet Take 2 tablets (20 mg total) by mouth at bedtime. (Patient not taking: Reported on 09/28/2021) 60 tablet 0   ketoconazole (NIZORAL) 2 % cream APPLY EXTERNALLY TO THE AFFECTED AREA EVERY DAY FOR 2 WEEKS (Patient not taking: Reported on 09/28/2021)     omeprazole (PRILOSEC) 40 MG capsule Take 1 capsule (40 mg total) by mouth daily. Nothing to eat or drink for 20-30 minutes. 30 capsule 2   ondansetron (ZOFRAN-ODT) 4 MG disintegrating tablet DISSOLVE 1 TABLET(4 MG) ON THE TONGUE EVERY 8 HOURS AS NEEDED FOR NAUSEA OR VOMITING (Patient not taking: Reported on 09/28/2021) 20 tablet 3   Rimegepant Sulfate (NURTEC) 75 MG TBDP Take 1 tablet by mouth daily as needed (Maximum 1 tablet in 24 hours). (Patient not taking: Reported on 09/28/2021) 8 tablet 11   Semaglutide (RYBELSUS PO) Take 300 mg by mouth. (Patient not taking: Reported on 09/28/2021)     tretinoin (RETIN-A) 0.025 % cream SMARTSIG:Sparingly Topical Every Night PRN (Patient not taking: Reported on 09/28/2021)     No current facility-administered medications for this visit.   Allergies: Allergies  Allergen Reactions   Latuda [Lurasidone Hcl] Other (See Comments)    Occular dystonia   Seroquel [Quetiapine Fumarate]  Swelling    Tongue swells    Benzoin Other (See Comments)    blisters   Latex Rash and Other (See Comments)   Sulfamethoxazole-Trimethoprim Itching and Nausea And Vomiting   I reviewed her past medical history, social history, family history, and environmental history and no significant changes have been reported from her previous visit.  Review of Systems  Constitutional:  Negative for appetite change, chills, fever and unexpected weight change.  HENT:  Positive for congestion, postnasal drip and sneezing. Negative for rhinorrhea.   Eyes:  Negative for  itching.  Respiratory:  Positive for cough, chest tightness and shortness of breath. Negative for wheezing.   Cardiovascular:  Negative for chest pain.  Gastrointestinal:  Negative for abdominal pain.  Genitourinary:  Negative for difficulty urinating.  Skin:  Negative for rash.  Allergic/Immunologic: Positive for environmental allergies.   Objective: BP 132/82    Pulse 94    Temp 98.2 F (36.8 C) (Temporal)    Resp 12    Ht $R'5\' 4"'vD$  (1.626 m)    Wt 213 lb 9.6 oz (96.9 kg)    SpO2 99%    BMI 36.66 kg/m  Body mass index is 36.66 kg/m. Physical Exam Vitals and nursing note reviewed.  Constitutional:      Appearance: Normal appearance. She is well-developed.  HENT:     Head: Normocephalic and atraumatic.     Right Ear: Tympanic membrane and external ear normal.     Left Ear: Tympanic membrane and external ear normal.     Nose: Nose normal.     Mouth/Throat:     Mouth: Mucous membranes are moist.     Pharynx: Oropharynx is clear.  Eyes:     Conjunctiva/sclera: Conjunctivae normal.  Cardiovascular:     Rate and Rhythm: Normal rate and regular rhythm.     Heart sounds: Normal heart sounds. No murmur heard.   No friction rub. No gallop.  Pulmonary:     Effort: Pulmonary effort is normal.     Breath sounds: Normal breath sounds. No wheezing, rhonchi or rales.  Musculoskeletal:     Cervical back: Neck supple.  Skin:    General:  Skin is warm.     Findings: No rash.  Neurological:     Mental Status: She is alert and oriented to person, place, and time.  Psychiatric:        Behavior: Behavior normal.  Previous notes and tests were reviewed. The plan was reviewed with the patient/family, and all questions/concerned were addressed.  It was my pleasure to see [name] today and participate in her care. Please feel free to contact me with any questions or concerns.  Sincerely,  Rexene Alberts, DO Allergy & Immunology  [facility] office: [place] office: [phone]

## 2021-09-28 ENCOUNTER — Other Ambulatory Visit: Payer: Self-pay

## 2021-09-28 ENCOUNTER — Ambulatory Visit (INDEPENDENT_AMBULATORY_CARE_PROVIDER_SITE_OTHER): Payer: BC Managed Care – PPO | Admitting: Allergy

## 2021-09-28 ENCOUNTER — Encounter: Payer: Self-pay | Admitting: Allergy

## 2021-09-28 VITALS — BP 132/82 | HR 94 | Temp 98.2°F | Resp 12 | Ht 64.0 in | Wt 213.6 lb

## 2021-09-28 DIAGNOSIS — L509 Urticaria, unspecified: Secondary | ICD-10-CM | POA: Diagnosis not present

## 2021-09-28 DIAGNOSIS — J3089 Other allergic rhinitis: Secondary | ICD-10-CM | POA: Diagnosis not present

## 2021-09-28 DIAGNOSIS — H1013 Acute atopic conjunctivitis, bilateral: Secondary | ICD-10-CM

## 2021-09-28 DIAGNOSIS — J4540 Moderate persistent asthma, uncomplicated: Secondary | ICD-10-CM | POA: Insufficient documentation

## 2021-09-28 DIAGNOSIS — H1010 Acute atopic conjunctivitis, unspecified eye: Secondary | ICD-10-CM | POA: Insufficient documentation

## 2021-09-28 DIAGNOSIS — B20 Human immunodeficiency virus [HIV] disease: Secondary | ICD-10-CM

## 2021-09-28 DIAGNOSIS — J329 Chronic sinusitis, unspecified: Secondary | ICD-10-CM

## 2021-09-28 DIAGNOSIS — L2089 Other atopic dermatitis: Secondary | ICD-10-CM

## 2021-09-28 DIAGNOSIS — J302 Other seasonal allergic rhinitis: Secondary | ICD-10-CM

## 2021-09-28 DIAGNOSIS — K219 Gastro-esophageal reflux disease without esophagitis: Secondary | ICD-10-CM

## 2021-09-28 MED ORDER — AZELASTINE HCL 0.05 % OP SOLN: 1.0000 [drp] | Freq: Two times a day (BID) | OPHTHALMIC | 2 refills | Status: DC | PRN

## 2021-09-28 MED ORDER — AIRDUO DIGIHALER 232-14 MCG/ACT IN AEPB: 1.0000 | INHALATION_SPRAY | Freq: Two times a day (BID) | RESPIRATORY_TRACT | 3 refills | Status: AC

## 2021-09-28 MED ORDER — RYALTRIS 665-25 MCG/ACT NA SUSP: 1.0000 | Freq: Two times a day (BID) | NASAL | 5 refills | Status: AC

## 2021-09-28 MED ORDER — OMEPRAZOLE 40 MG PO CPDR: 40.0000 mg | DELAYED_RELEASE_CAPSULE | Freq: Every day | ORAL | 2 refills | Status: AC

## 2021-09-28 NOTE — Assessment & Plan Note (Signed)
2022 Bloodwork (CBC diff, CMP, TSH, ANA, ESR, CRP, CU, tryptase, Alpha gal) all normal.  °· Monitor symptoms.  °

## 2021-09-28 NOTE — Assessment & Plan Note (Signed)
·   Continue proper skin care. 

## 2021-09-28 NOTE — Patient Instructions (Addendum)
Asthma: Daily controller medication(s): start Airduo 223mcg 1 puff twice a day. Sample given. Demonstrated proper use. Let me know if too expensive.  Continue Singulair (montelukast) 10mg  daily at night. During upper respiratory infections/asthma flares:  Start Qvar 43mcg 2 puffs twice a day for 1-2 weeks until your breathing symptoms return to baseline.  Pretreat with albuterol 2 puffs or albuterol nebulizer.  If you need to use your albuterol nebulizer machine back to back within 15-30 minutes with no relief then please go to the ER/urgent care for further evaluation.  May use albuterol rescue inhaler 2 puffs or nebulizer every 4 to 6 hours as needed for shortness of breath, chest tightness, coughing, and wheezing. May use albuterol rescue inhaler 2 puffs 5 to 15 minutes prior to strenuous physical activities. Monitor frequency of use.  Asthma control goals:  Full participation in all desired activities (may need albuterol before activity) Albuterol use two times or less a week on average (not counting use with activity) Cough interfering with sleep two times or less a month Oral steroids no more than once a year No hospitalizations   Environmental allergies Continue environmental control measures as below. Continue Singulair (montelukast) 10mg  daily at night. Use over the counter antihistamines such as Zyrtec (cetirizine), Claritin (loratadine), Allegra (fexofenadine), or Xyzal (levocetirizine) daily as needed. May take twice a day during allergy flares. May switch antihistamines every few months. Start Ryaltris (olopatadine + mometasone nasal spray combination) 1-2 sprays per nostril twice a day. Sample given. Let me know if too expensive.  Consider allergy injections for long term control if above medications do not help the symptoms.   Infections Keep track of infections and antibiotics. Get bloodwork to look at response.  Heartburn: Continue lifestyle and dietary  modifications. Restart  omeprazole 40mg  daily in the morning and nothing to eat/drink for 30 minutes afterwards.   Follow up in 2 months or sooner if needed.    Reducing Pollen Exposure Pollen seasons: trees (spring), grass (summer) and ragweed/weeds (fall). Keep windows closed in your home and car to lower pollen exposure.  Install air conditioning in the bedroom and throughout the house if possible.  Avoid going out in dry windy days - especially early morning. Pollen counts are highest between 5 - 10 AM and on dry, hot and windy days.  Save outside activities for late afternoon or after a heavy rain, when pollen levels are lower.  Avoid mowing of grass if you have grass pollen allergy. Be aware that pollen can also be transported indoors on people and pets.  Dry your clothes in an automatic dryer rather than hanging them outside where they might collect pollen.  Rinse hair and eyes before bedtime. Pet Allergen Avoidance: Contrary to popular opinion, there are no hypoallergenic breeds of dogs or cats. That is because people are not allergic to an animals hair, but to an allergen found in the animal's saliva, dander (dead skin flakes) or urine. Pet allergy symptoms typically occur within minutes. For some people, symptoms can build up and become most severe 8 to 12 hours after contact with the animal. People with severe allergies can experience reactions in public places if dander has been transported on the pet owners clothing. Keeping an animal outdoors is only a partial solution, since homes with pets in the yard still have higher concentrations of animal allergens. Before getting a pet, ask your allergist to determine if you are allergic to animals. If your pet is already considered part of your family,  try to minimize contact and keep the pet out of the bedroom and other rooms where you spend a great deal of time. As with dust mites, vacuum carpets often or replace carpet with a  hardwood floor, tile or linoleum. High-efficiency particulate air (HEPA) cleaners can reduce allergen levels over time. While dander and saliva are the source of cat and dog allergens, urine is the source of allergens from rabbits, hamsters, mice and [place] pigs; so ask a non-allergic family member to clean the animals cage. If you have a pet allergy, talk to your allergist about the potential for allergy immunotherapy (allergy shots). This strategy can often provide long-term relief.

## 2021-09-28 NOTE — Assessment & Plan Note (Signed)
Past history - Perennial rhinoconjunctivitis symptoms for 25+ years with worsening in the spring.  Tried over-the-counter antihistamines, Flonase and ipratropium with some benefit.  2020 testing showed multiple positives per patient report.  No prior AIT.  ENT evaluation done.  No prior sinus surgery. 2022 skin prick testing showed: Positive to trees, cat and horse. Interim history - 2022 bloodwork positive to cat, dog and borderline to mouse. Tried Ryaltris sample only. Not sure if helpful as she got a sinus infection after the last visit.   Continue environmental control measures as below.  Continue Singulair (montelukast) 10mg  daily at night.  Use over the counter antihistamines such as Zyrtec (cetirizine), Claritin (loratadine), Allegra (fexofenadine), or Xyzal (levocetirizine) daily as needed. May take twice a day during allergy flares. May switch antihistamines every few months.  Start Ryaltris (olopatadine + mometasone nasal spray combination) 1-2 sprays per nostril twice a day. Sample given. o Let me know if too expensive.   Consider allergy injections for long term control if above medications do not help the symptoms.

## 2021-09-28 NOTE — Assessment & Plan Note (Signed)
Past history - Diagnosed with asthma 30 years ago.  Currently using Singulair, Qvar 40 mcg 2 puffs twice a day and albuterol as needed with some benefit. 2022 spirometry showed some restriction with 13% improvement in FEV1 post bronchodilator treatment.  Clinically feeling unchanged. Interim history - Used Breo x 1 month only due to insurance coverage. Not sure if it helped. Needed prednisone during recent URI.  Today's spirometry was normal.  Daily controller medication(s): start Airduo 257mcg 1 puff twice a day. Sample given. Demonstrated proper use. o Let me know if too expensive.   Continue Singulair (montelukast) 10mg  daily at night.  During upper respiratory infections/asthma flares:  o Start Qvar 23mcg 2 puffs twice a day for 1-2 weeks until your breathing symptoms return to baseline.  o Pretreat with albuterol 2 puffs or albuterol nebulizer.  o If you need to use your albuterol nebulizer machine back to back within 15-30 minutes with no relief then please go to the ER/urgent care for further evaluation.   May use albuterol rescue inhaler 2 puffs or nebulizer every 4 to 6 hours as needed for shortness of breath, chest tightness, coughing, and wheezing. May use albuterol rescue inhaler 2 puffs 5 to 15 minutes prior to strenuous physical activities. Monitor frequency of use.   Get spirometry at next visit.

## 2021-09-28 NOTE — Assessment & Plan Note (Signed)
Past history - Frequent sinus infections requiring 2-3 antibiotics per year.  Patient was diagnosed with HIV in 2008 and is followed by infectious disease. Interim history - 1 infection and antibiotics since last visit. Got pneumonia vaccine.   Keep track of infections and antibiotics.  Get bloodwork to look at pneumococcal titer response.  Discussed with patient that her HIV status and environmental allergies are 2 factors that may also predispose her to frequent infections.

## 2021-09-28 NOTE — Assessment & Plan Note (Deleted)
2022 Bloodwork (CBC diff, CMP, TSH, ANA, ESR, CRP, CU, tryptase, Alpha gal) all normal.   Monitor symptoms.

## 2021-09-28 NOTE — Assessment & Plan Note (Signed)
Not on PPI, increased symptoms.  Continue lifestyle and dietary modifications.  Restart  omeprazole 40mg  daily in the morning and nothing to eat/drink for 30 minutes afterwards.

## 2021-10-01 ENCOUNTER — Telehealth: Payer: Self-pay

## 2021-10-01 NOTE — Telephone Encounter (Signed)
Patient called and is most likely transferring care to [place]. Advised her to come by the office prior to moving to sign ROI so that we can share her records with her new provider. She says she will try to come by today or tomorrow.   Beryle Flock, RN

## 2021-10-03 LAB — STREP PNEUMONIAE 23 SEROTYPES IGG
Pneumo Ab Type 1*: 4.7 ug/mL (ref >1.3)
Pneumo Ab Type 12 (12F)*: 0.6 ug/mL — ABNORMAL LOW (ref >1.3)
Pneumo Ab Type 14*: 7.6 ug/mL (ref >1.3)
Pneumo Ab Type 17 (17F)*: 12.3 ug/mL (ref >1.3)
Pneumo Ab Type 19 (19F)*: >30.3 ug/mL (ref >1.3)
Pneumo Ab Type 2*: 18 ug/mL (ref >1.3)
Pneumo Ab Type 20*: 6.5 ug/mL (ref >1.3)
Pneumo Ab Type 22 (22F)*: 7.9 ug/mL (ref >1.3)
Pneumo Ab Type 23 (23F)*: 3.9 ug/mL (ref >1.3)
Pneumo Ab Type 26 (6B)*: 16.9 ug/mL (ref >1.3)
Pneumo Ab Type 3*: 0.7 ug/mL — ABNORMAL LOW (ref >1.3)
Pneumo Ab Type 34 (10A)*: 2.7 ug/mL (ref >1.3)
Pneumo Ab Type 4*: 5.9 ug/mL (ref >1.3)
Pneumo Ab Type 43 (11A)*: 3.9 ug/mL (ref >1.3)
Pneumo Ab Type 5*: 14.2 ug/mL (ref >1.3)
Pneumo Ab Type 51 (7F)*: 1 ug/mL — ABNORMAL LOW (ref >1.3)
Pneumo Ab Type 54 (15B)*: 1 ug/mL — ABNORMAL LOW (ref >1.3)
Pneumo Ab Type 56 (18C)*: 3.8 ug/mL (ref >1.3)
Pneumo Ab Type 57 (19A)*: 10.4 ug/mL (ref >1.3)
Pneumo Ab Type 68 (9V)*: 2 ug/mL (ref >1.3)
Pneumo Ab Type 70 (33F)*: 2 ug/mL (ref >1.3)
Pneumo Ab Type 8*: 5.6 ug/mL (ref >1.3)
Pneumo Ab Type 9 (9N)*: 2.4 ug/mL (ref >1.3)

## 2021-10-03 LAB — DIPHTHERIA / TETANUS ANTIBODY PANEL
Diphtheria Ab: 0.11 IU/mL (ref <0.10)
Tetanus Ab, IgG: 0.38 IU/mL (ref <0.10)

## 2021-10-15 ENCOUNTER — Ambulatory Visit (INDEPENDENT_AMBULATORY_CARE_PROVIDER_SITE_OTHER): Payer: BC Managed Care – PPO | Admitting: Infectious Diseases

## 2021-10-15 ENCOUNTER — Encounter: Payer: Self-pay | Admitting: Infectious Diseases

## 2021-10-15 ENCOUNTER — Other Ambulatory Visit: Payer: Self-pay

## 2021-10-15 ENCOUNTER — Encounter: Payer: BC Managed Care – PPO | Admitting: Pharmacist

## 2021-10-15 VITALS — BP 140/82 | HR 89 | Resp 16 | Ht 64.0 in | Wt 214.2 lb

## 2021-10-15 DIAGNOSIS — Z79899 Other long term (current) drug therapy: Secondary | ICD-10-CM

## 2021-10-15 DIAGNOSIS — B20 Human immunodeficiency virus [HIV] disease: Secondary | ICD-10-CM | POA: Diagnosis not present

## 2021-10-15 DIAGNOSIS — G4700 Insomnia, unspecified: Secondary | ICD-10-CM

## 2021-10-15 DIAGNOSIS — Z113 Encounter for screening for infections with a predominantly sexual mode of transmission: Secondary | ICD-10-CM

## 2021-10-15 DIAGNOSIS — J329 Chronic sinusitis, unspecified: Secondary | ICD-10-CM | POA: Diagnosis not present

## 2021-10-15 DIAGNOSIS — R739 Hyperglycemia, unspecified: Secondary | ICD-10-CM

## 2021-10-15 MED ORDER — FLUTICASONE PROPIONATE 50 MCG/ACT NA SUSP: 1.0000 | Freq: Every day | NASAL | 2 refills | Status: AC

## 2021-10-15 MED ORDER — CABOTEGRAVIR & RILPIVIRINE ER 600 & 900 MG/3ML IM SUER
1.0000 | Freq: Once | INTRAMUSCULAR | Status: AC
  Administered 2021-10-15: 1 via INTRAMUSCULAR

## 2021-10-15 NOTE — Assessment & Plan Note (Signed)
Will re-order her A1C.  ?

## 2021-10-15 NOTE — Addendum Note (Signed)
Addended by: Theresia Majors A on: 10/15/2021 04:31 PM ? ? Modules accepted: Orders ? ?

## 2021-10-15 NOTE — Assessment & Plan Note (Signed)
Sleeps ~ 4h.  ?Given sleep hygeine suggestions.  ?Prev took trazadone. No help from this, hangover effects.  ?Also prev tried melatonin.  ?Worried she won't wake up in time in am (needs to be up at 5:30).  ?

## 2021-10-15 NOTE — Assessment & Plan Note (Signed)
Will give her trial of flonase.  ?

## 2021-10-15 NOTE — Assessment & Plan Note (Signed)
She is doing well ?cabaneuva dose today.  ?Offered/refused condoms.  ?vax are up to date.  ?Needs mammo, she will schedule.  ?She is moving to [place], glad to see her back when she returns (? 1 year) ? ?

## 2021-10-15 NOTE — Progress Notes (Signed)
? ?Subjective:  ? ? Patient ID: [name], female  DOB: 10/23/1980, 41 y.o.        MRN: [id number] ? ? 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 Normal PAP 07-2019 at [facility]. Had 07-2021, was told everything was (-).  ?Currently on cabaneuva since 04-2021, prev biktarvy, atripla. ?Has regular f/u at behavioral health --> psychiatry (PTSD, insomnia).  ?  ?Was started on statin recently by her PCP.  ?Had normal PAP 07-2021.  ? 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.  ?  ?Saw wt loss dr, started on simeglutide but her insurance wouldn't cover any rx. (A1C was up a little per pt prev). Not able to exercise. Watching diet, (decreased appetite).  ? ?Had L knee fx 2022. She has "chip in bone" still. Now has patellar tendonitis as well. Is currently out of work. No surgery (yet).  ? ?Having sinus mucous.  ? ?Moving back to [place].  ? ?HIV 1 RNA Quant (Copies/mL)  ?Date Value  ?08/20/2021 21 (H)  ?06/18/2021 30 (H)  ?02/06/2021 <20 (H)  ? ?CD4 T Cell Abs (/uL)  ?Date Value  ?08/20/2021 1,434  ?05/15/2020 1,521  ?08/28/2019 1,357  ? ? ? ?Health Maintenance  ?Topic Date Due  ?? FOOT EXAM  41 y.o.  ?? OPHTHALMOLOGY EXAM  Never done  ?? URINE MICROALBUMIN  04/27/2017  ?? PAP SMEAR-Modifier  09/22/2019  ?? HEMOGLOBIN A1C  08/08/2021  ?? TETANUS/TDAP  08/06/2031  ?? COVID-19 Vaccine  Completed  ?? Hepatitis C Screening  Completed  ?? HIV Screening  Completed  ?? HPV VACCINES  Aged Out  ? ? ? ? ?Review of Systems  ?Constitutional:  Negative for chills, fever and weight loss.  ?HENT:  Positive for congestion.   ?Respiratory:  Negative for cough and shortness of breath.   ?Gastrointestinal:  Negative for constipation and diarrhea.  ?Genitourinary:  Negative for dysuria.  ?Musculoskeletal:  Positive for joint pain.  ?Psychiatric/Behavioral:  The patient has insomnia.   ? ?Please see HPI. All other systems reviewed and negative. ? ?   ?Objective:   ?Physical Exam ?Vitals reviewed.  ?Constitutional:   ?   General: She is not in acute distress. ?   Appearance: She is obese. She is not toxic-appearing.  ?HENT:  ?   Mouth/Throat:  ?   Mouth: Mucous membranes are moist.  ?   Pharynx: No oropharyngeal exudate.  ?   Comments: Small cyst on lower lip.  ?Eyes:  ?   Extraocular Movements: Extraocular movements intact.  ?   Pupils: Pupils are equal, round, and reactive to light.  ?Cardiovascular:  ?   Rate and Rhythm: Normal rate and regular rhythm.  ?Pulmonary:  ?   Effort: Pulmonary effort is normal.  ?   Breath sounds: Normal breath sounds.  ?Abdominal:  ?   General: Bowel sounds are normal. There is no distension.  ?   Palpations: Abdomen is soft.  ?   Tenderness: There is no abdominal tenderness.  ?Musculoskeletal:     ?   General: Normal range of motion.  ?   Cervical back: Normal range of motion and neck supple.  ?   Right lower leg: No edema.  ?   Left lower leg: No edema.  ?Neurological:  ?   General: No focal deficit present.  ?   Mental Status: She is alert.  ?Psychiatric:     ?  Mood and Affect: Mood normal.  ? ? ? ? ? ? ?   ?Assessment & Plan:  ? ?

## 2021-11-03 ENCOUNTER — Other Ambulatory Visit: Payer: Self-pay

## 2021-11-03 ENCOUNTER — Encounter: Payer: Self-pay | Admitting: Infectious Diseases

## 2021-11-03 ENCOUNTER — Telehealth: Payer: Self-pay

## 2021-11-03 DIAGNOSIS — E669 Obesity, unspecified: Secondary | ICD-10-CM

## 2021-11-03 MED ORDER — QSYMIA 3.75-23 MG PO CP24: 1.0000 | ORAL_CAPSULE | Freq: Every day | ORAL | 3 refills | Status: DC

## 2021-11-03 NOTE — Telephone Encounter (Signed)
Called patient regarding refill request for Qsymia. Per Dr. Johnnye Sima patient will need pregnancy test before starting medication. Informed patient that she will need to hold on medication until this is done. ?Patient states that she is currently not sexually active and would like to know if she would still need to come in or not.  ?Will forward message to Md.  ?Leatrice Jewels, RMA ? ?

## 2021-11-03 NOTE — Progress Notes (Signed)
Proscription called in to patient's preferred pharmacy. Pharmacist able to take verbal. ?Leatrice Jewels, RMA ? ?

## 2021-11-04 NOTE — Telephone Encounter (Signed)
Per patient's request call [facility] today to follow up and confirm they have prescription for weight loss medication. Spoke with pharmacist who confirms they have Rx. Medication is not covered under insurance and will be an out of pocket expense.  ?Patient is scheduled to see provider Thursday.  ?Will discuss this at visit. ?Leatrice Jewels, RMA ? ?

## 2021-11-05 ENCOUNTER — Other Ambulatory Visit: Payer: Self-pay

## 2021-11-05 ENCOUNTER — Ambulatory Visit (INDEPENDENT_AMBULATORY_CARE_PROVIDER_SITE_OTHER): Payer: BC Managed Care – PPO | Admitting: Infectious Diseases

## 2021-11-05 ENCOUNTER — Encounter: Payer: Self-pay | Admitting: Infectious Diseases

## 2021-11-05 VITALS — BP 107/73 | HR 96 | Temp 98.2°F | Wt 217.0 lb

## 2021-11-05 DIAGNOSIS — B20 Human immunodeficiency virus [HIV] disease: Secondary | ICD-10-CM | POA: Diagnosis not present

## 2021-11-05 DIAGNOSIS — E669 Obesity, unspecified: Secondary | ICD-10-CM | POA: Diagnosis not present

## 2021-11-05 DIAGNOSIS — T50905A Adverse effect of unspecified drugs, medicaments and biological substances, initial encounter: Secondary | ICD-10-CM | POA: Diagnosis not present

## 2021-11-05 MED ORDER — QSYMIA 3.75-23 MG PO CP24: 1.0000 | ORAL_CAPSULE | Freq: Every day | ORAL | 3 refills | Status: DC

## 2021-11-05 NOTE — Addendum Note (Signed)
Addended by: Eugenia Mcalpine on: 11/05/2021 02:51 PM ? ? Modules accepted: Orders ? ?

## 2021-11-05 NOTE — Progress Notes (Signed)
? ?  Subjective:  ? ? Patient ID: [name], female  DOB: 1980-12-27, 41 y.o.        MRN: [id number] ? ? 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 Normal PAP 07-2019 at [facility]. Had 07-2021, was told everything was (-).  ?Currently on cabaneuva since 04-2021, prev biktarvy, atripla. ?Has regular f/u at behavioral health --> psychiatry (PTSD, insomnia).  ?  ?Was started on statin recently by her PCP.  ?Had normal PAP 07-2021.  ?  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  ?  ?Saw wt loss dr, started on simeglutide but her insurance wouldn't cover any rx/copay was $604. (A1C was 5.9% 07-2021). Not able to exercise. Watching diet, (decreased appetite).  ?  ?Had L knee fx 2022. She has "chip in bone" still. Now has patellar tendonitis as well. Is currently out of work. No surgery (yet).  ?She has been seen by ortho for her back pain who encouraged her to lose wt.  ?She is not able to go back to work, expected return 11-26-21.  ?Wants to work from home.  ?  ?Moving back to [place] when school year finishes.  ? 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 Both parents have RA.  ? ?HIV 1 RNA Quant (Copies/mL)  ?Date Value  ?08/20/2021 21 (H)  ?06/18/2021 30 (H)  ?02/06/2021 <20 (H)  ? ?CD4 T Cell Abs (/uL)  ?Date Value  ?08/20/2021 1,434  ?05/15/2020 1,521  ?08/28/2019 1,357  ? ? ? ?Health Maintenance  ?Topic Date Due  ?? FOOT EXAM  Never done  ?? OPHTHALMOLOGY EXAM  Never done  ?? URINE MICROALBUMIN  04/27/2017  ?? PAP SMEAR-Modifier  09/22/2019  ?? HEMOGLOBIN A1C  08/08/2021  ?? TETANUS/TDAP  08/06/2031  ?? COVID-19 Vaccine  Completed  ?? Hepatitis C Screening  Completed  ?? HIV Screening  Completed  ?? HPV VACCINES  Aged Out  ? ? ? ? ?Review of Systems  ?Constitutional:  Negative for weight loss.  ?Musculoskeletal:  Positive for back pain and joint pain.  ?Psychiatric/Behavioral:  The patient has insomnia.   ? ?Please see HPI.  All other systems reviewed and negative. ? ?   ?Objective:  ?Physical Exam ? ? ? ? ? ?   ?Assessment & Plan:  ? ?

## 2021-11-05 NOTE — Assessment & Plan Note (Signed)
Doing well on current art ?Not sexually active.  ? ?

## 2021-11-05 NOTE — Assessment & Plan Note (Signed)
Her qsymia rx is sent to the product site.  ?She is getting Uhcg tested today.  ?Will call if positive.  ?rtc in 9 months.  ?

## 2021-11-12 ENCOUNTER — Other Ambulatory Visit: Payer: Self-pay | Admitting: Infectious Diseases

## 2021-11-12 DIAGNOSIS — J45991 Cough variant asthma: Secondary | ICD-10-CM

## 2021-11-12 NOTE — Telephone Encounter (Signed)
Please advise on refill.

## 2021-11-17 ENCOUNTER — Other Ambulatory Visit: Payer: Self-pay

## 2021-11-17 ENCOUNTER — Encounter: Payer: Self-pay | Admitting: Infectious Diseases

## 2021-11-17 DIAGNOSIS — E669 Obesity, unspecified: Secondary | ICD-10-CM

## 2021-11-17 MED ORDER — QSYMIA 3.75-23 MG PO CP24: 1.0000 | ORAL_CAPSULE | Freq: Every day | ORAL | 3 refills | Status: DC

## 2021-11-19 MED ORDER — QSYMIA 3.75-23 MG PO CP24: 1.0000 | ORAL_CAPSULE | Freq: Every day | ORAL | 3 refills | Status: DC

## 2021-11-19 NOTE — Telephone Encounter (Signed)
Medication unable to be sent electronically, left voicemail for [facility] with verbal order for prescription per Dr. Johnnye Sima. Patient will need to call to initiate the order.  ? ? ?P: [phone] ? ?Beryle Flock, RN ? ?

## 2021-11-30 DIAGNOSIS — F411 Generalized anxiety disorder: Secondary | ICD-10-CM | POA: Diagnosis not present

## 2021-12-15 ENCOUNTER — Encounter: Payer: BC Managed Care – PPO | Admitting: Pharmacist

## 2021-12-15 DIAGNOSIS — S83412A Sprain of medial collateral ligament of left knee, initial encounter: Secondary | ICD-10-CM | POA: Diagnosis not present

## 2021-12-15 DIAGNOSIS — M5442 Lumbago with sciatica, left side: Secondary | ICD-10-CM | POA: Diagnosis not present

## 2021-12-15 DIAGNOSIS — M542 Cervicalgia: Secondary | ICD-10-CM | POA: Diagnosis not present

## 2021-12-15 DIAGNOSIS — S39012D Strain of muscle, fascia and tendon of lower back, subsequent encounter: Secondary | ICD-10-CM | POA: Diagnosis not present

## 2021-12-17 DIAGNOSIS — F331 Major depressive disorder, recurrent, moderate: Secondary | ICD-10-CM | POA: Diagnosis not present

## 2021-12-21 DIAGNOSIS — S39012D Strain of muscle, fascia and tendon of lower back, subsequent encounter: Secondary | ICD-10-CM | POA: Diagnosis not present

## 2021-12-21 DIAGNOSIS — S83412A Sprain of medial collateral ligament of left knee, initial encounter: Secondary | ICD-10-CM | POA: Diagnosis not present

## 2021-12-21 DIAGNOSIS — M542 Cervicalgia: Secondary | ICD-10-CM | POA: Diagnosis not present

## 2021-12-21 DIAGNOSIS — M5442 Lumbago with sciatica, left side: Secondary | ICD-10-CM | POA: Diagnosis not present

## 2021-12-23 ENCOUNTER — Ambulatory Visit: Payer: BC Managed Care – PPO | Admitting: Allergy

## 2021-12-25 NOTE — Patient Instructions (Incomplete)
Moderate persistent asthm ?Daily controller medication(s): continue Airduo 27mg 1 puff twice a day. Sample given. \  ?Continue Singulair (montelukast) '10mg'$  daily at night. ?During upper respiratory infections/asthma flares:  ?Start Qvar 478m 2 puffs twice a day for 1-2 weeks until your breathing symptoms return to baseline.  ?Pretreat with albuterol 2 puffs or albuterol nebulizer.  ?If you need to use your albuterol nebulizer machine back to back within 15-30 minutes with no relief then please go to the ER/urgent care for further evaluation.  ?May use albuterol rescue inhaler 2 puffs or nebulizer every 4 to 6 hours as needed for shortness of breath, chest tightness, coughing, and wheezing. May use albuterol rescue inhaler 2 puffs 5 to 15 minutes prior to strenuous physical activities. Monitor frequency of use.  ?Asthma control goals:  ?Full participation in all desired activities (may need albuterol before activity) ?Albuterol use two times or less a week on average (not counting use with activity) ?Cough interfering with sleep two times or less a month ?Oral steroids no more than once a year ?No hospitalizations  ? ?Seasonal and perennial alllergicrhinoconjunctivitis ?Continue environmental control measures as below. ?Continue Singulair (montelukast) '10mg'$  daily at night. ?Use over the counter antihistamines such as Zyrtec (cetirizine), Claritin (loratadine), Allegra (fexofenadine), or Xyzal (levocetirizine) daily as needed. May take twice a day during allergy flares. May switch antihistamines every few months. ?Continue Ryaltris (olopatadine + mometasone nasal spray combination) 1-2 sprays per nostril twice a day.   ?Consider allergy injections for long term control if above medications do not help the symptoms.  ? ?Frequent sinus nfections ?Keep track of infections and antibiotics. ? ?Reflux ?Continue lifestyle and dietary modifications. ?Continue  omeprazole '40mg'$  daily in the morning and nothing to eat/drink  for 30 minutes afterwards.  ? ?Follow up in  months or sooner if needed.   ? ?Reducing Pollen Exposure ?Pollen seasons: trees (spring), grass (summer) and ragweed/weeds (fall). ?Keep windows closed in your home and car to lower pollen exposure.  ?Install air conditioning in the bedroom and throughout the house if possible.  ?Avoid going out in dry windy days - especially early morning. ?Pollen counts are highest between 5 - 10 AM and on dry, hot and windy days.  ?Save outside activities for late afternoon or after a heavy rain, when pollen levels are lower.  ?Avoid mowing of grass if you have grass pollen allergy. ?Be aware that pollen can also be transported indoors on people and pets.  ?Dry your clothes in an automatic dryer rather than hanging them outside where they might collect pollen.  ?Rinse hair and eyes before bedtime. ?Pet Allergen Avoidance: ?Contrary to popular opinion, there are no ?hypoallergenic? breeds of dogs or cats. That is because people are not allergic to an animal?s hair, but to an allergen found in the animal's saliva, dander (dead skin flakes) or urine. Pet allergy symptoms typically occur within minutes. For some people, symptoms can build up and become most severe 8 to 12 hours after contact with the animal. People with severe allergies can experience reactions in public places if dander has been transported on the pet owners? clothing. ?Keeping an animal outdoors is only a partial solution, since homes with pets in the yard still have higher concentrations of animal allergens. ?Before getting a pet, ask your allergist to determine if you are allergic to animals. If your pet is already considered part of your family, try to minimize contact and keep the pet out of the bedroom and other rooms  where you spend a great deal of time. ?As with dust mites, vacuum carpets often or replace carpet with a hardwood floor, tile or linoleum. ?High-efficiency particulate air (HEPA) cleaners can reduce  allergen levels over time. ?While dander and saliva are the source of cat and dog allergens, urine is the source of allergens from rabbits, hamsters, mice and [place] pigs; so ask a non-allergic family member to clean the animal?s cage. ?If you have a pet allergy, talk to your allergist about the potential for allergy immunotherapy (allergy shots). This strategy can often provide long-term relief. ? ? ?

## 2021-12-28 ENCOUNTER — Ambulatory Visit: Payer: BC Managed Care – PPO | Admitting: Family

## 2021-12-28 DIAGNOSIS — F331 Major depressive disorder, recurrent, moderate: Secondary | ICD-10-CM | POA: Diagnosis not present

## 2021-12-28 DIAGNOSIS — J309 Allergic rhinitis, unspecified: Secondary | ICD-10-CM

## 2021-12-29 DIAGNOSIS — S83412A Sprain of medial collateral ligament of left knee, initial encounter: Secondary | ICD-10-CM | POA: Diagnosis not present

## 2021-12-29 DIAGNOSIS — M5442 Lumbago with sciatica, left side: Secondary | ICD-10-CM | POA: Diagnosis not present

## 2021-12-29 DIAGNOSIS — M542 Cervicalgia: Secondary | ICD-10-CM | POA: Diagnosis not present

## 2021-12-29 DIAGNOSIS — S39012D Strain of muscle, fascia and tendon of lower back, subsequent encounter: Secondary | ICD-10-CM | POA: Diagnosis not present

## 2021-12-30 DIAGNOSIS — M5412 Radiculopathy, cervical region: Secondary | ICD-10-CM | POA: Diagnosis not present

## 2022-01-01 ENCOUNTER — Other Ambulatory Visit: Payer: Self-pay

## 2022-01-01 ENCOUNTER — Ambulatory Visit (INDEPENDENT_AMBULATORY_CARE_PROVIDER_SITE_OTHER): Payer: BC Managed Care – PPO | Admitting: Pharmacist

## 2022-01-01 DIAGNOSIS — B20 Human immunodeficiency virus [HIV] disease: Secondary | ICD-10-CM

## 2022-01-01 MED ORDER — CABOTEGRAVIR & RILPIVIRINE ER 600 & 900 MG/3ML IM SUER
1.0000 | Freq: Once | INTRAMUSCULAR | Status: AC
  Administered 2022-01-01: 1 via INTRAMUSCULAR

## 2022-01-01 NOTE — Progress Notes (Signed)
HPI: [name] is a 41 y.o. female who presents to the [facility] for [facility] administration.  Patient Active Problem List   Diagnosis Date Noted   Insomnia 10/15/2021   Not well controlled moderate persistent asthma 09/28/2021   Seasonal and perennial allergic rhinoconjunctivitis 09/28/2021   Other atopic dermatitis 07/20/2021   Hyperglycemia 08/28/2019   Obesity (BMI 35.0-39.9 without comorbidity) 07/26/2019   Costochondritis, acute 01/02/2018   Bipolar mixed affective disorder, moderate ([place]) 03/20/2017   Chronic tension headaches 01/23/2017   Hyperlipidemia 11/17/2015   Frequent sinus infections 06/30/2015   Hernia, abdominal 08/05/2014   LGSIL of cervix of undetermined significance 10/24/2013   Superficial Liver masses, right lobe; suspected dermoid recurrence.   05/07/2013   Ovarian cystic mass 05/07/2013   s/p Serosal repair of colon 12/04/2012 12/15/2012   Herpes genitalis in women 12/07/2012   Anxiety state, unspecified 12/07/2012   GERD (gastroesophageal reflux disease) 12/07/2012   Generalized anxiety disorder 12/07/2012   Pelvic mass in female s/p Robotic-assisted right salpingo-oophorectomy 12/04/2012   HEMORRHOIDS NOS, W/O COMPLICATIONS 69/67/8938   Depression 11/25/2006   Other allergic rhinitis 11/25/2006   ASTHMA, COUGH VARIANT 11/15/2006   Human immunodeficiency virus (HIV) disease ([place]) 11/10/2006   Urticaria 11/10/2006    Patient's Medications  New Prescriptions   No medications on file  Previous Medications   ALBUTEROL (PROVENTIL) (2.5 MG/3ML) 0.083% NEBULIZER SOLUTION    Take 3 mLs (2.5 mg total) by nebulization every 4 (four) hours as needed for wheezing or shortness of breath (coughing fits).   ALBUTEROL (VENTOLIN HFA) 108 (90 BASE) MCG/ACT INHALER    Inhale 2 puffs into the lungs every 4 (four) hours as needed for wheezing or shortness of breath (coughing fits).   AMPHETAMINE-DEXTROAMPHETAMINE (ADDERALL XR) 30 MG 24 HR CAPSULE     Take by mouth.   AZELASTINE (OPTIVAR) 0.05 % OPHTHALMIC SOLUTION    Place 1 drop into both eyes 2 (two) times daily as needed (itchy/watery eyes).   CABOTEGRAVIR & RILPIVIRINE ER (CABENUVA) 600 & 900 MG/3ML INJECTION    Inject 1 kit into the muscle every 2 (two) months.   CETIRIZINE (ZYRTEC) 10 MG TABLET    Take 2 tablets (20 mg total) by mouth at bedtime.   CLINDAMYCIN (CLEOCIN T) 1 % EXTERNAL SOLUTION    USE DAILY FOR PREVENTION OR 2 TIMES DAILY WHEN ACTIVE LESIONS   DENTA 5000 PLUS 1.1 % CREA DENTAL CREAM    USE SMALL AMOUNT ON TEETH AND GUMS EVERY EVENING SPIT OUT EXCESS AND DO NOT RINSE   FLUOCINOLONE ACETONIDE BODY 0.01 % OIL    Apply to scalp once daily as needed for itching/rash.   FLUOXETINE (PROZAC) 40 MG CAPSULE    Take 40 mg by mouth daily.   FLUTICASONE (FLONASE) 50 MCG/ACT NASAL SPRAY    Place 1 spray into both nostrils daily.   FLUTICASONE-SALMETEROL,SENSOR, (AIRDUO DIGIHALER) 232-14 MCG/ACT AEPB    Inhale 1 puff into the lungs in the morning and at bedtime. Rinse mouth after each use.   HYDROCORTISONE 2.5 % CREAM    Place rectally.   KETOCONAZOLE (NIZORAL) 2 % CREAM    APPLY EXTERNALLY TO THE AFFECTED AREA EVERY DAY FOR 2 WEEKS   LEVONORGESTREL (KYLEENA IU)    by Intrauterine route.   MONTELUKAST (SINGULAIR) 10 MG TABLET    TAKE 1 TABLET BY MOUTH AT BEDTIME   OLOPATADINE-MOMETASONE (RYALTRIS) 665-25 MCG/ACT SUSP    Place 1-2 sprays into the nose in the morning and at  bedtime.   OMEPRAZOLE (PRILOSEC) 40 MG CAPSULE    Take 1 capsule (40 mg total) by mouth daily. Nothing to eat or drink for 20-30 minutes.   ONDANSETRON (ZOFRAN-ODT) 4 MG DISINTEGRATING TABLET    DISSOLVE 1 TABLET(4 MG) ON THE TONGUE EVERY 8 HOURS AS NEEDED FOR NAUSEA OR VOMITING   OXYMETAZOLINE (AFRIN) 0.05 % NASAL SPRAY    Place 1 spray into both nostrils 2 (two) times daily. For <7 days at a time.   PHENTERMINE-TOPIRAMATE (QSYMIA) 3.75-23 MG CP24    Take 1 tablet by mouth daily.   QVAR REDIHALER 40 MCG/ACT INHALER     INHALE 2 PUFFS INTO THE LUNGS TWICE DAILY   RIMEGEPANT SULFATE (NURTEC) 75 MG TBDP    Take 1 tablet by mouth daily as needed (Maximum 1 tablet in 24 hours).   ROSUVASTATIN (CRESTOR) 20 MG TABLET    Take by mouth.   TIZANIDINE (ZANAFLEX) 4 MG TABLET    Take 4 mg by mouth 3 (three) times daily.   TRETINOIN (RETIN-A) 0.025 % CREAM       TRIAMCINOLONE CREAM (KENALOG) 0.1 %    Apply 1 application topically 2 (two) times daily.   VALACYCLOVIR (VALTREX) 1000 MG TABLET    TAKE 1 TABLET(1000 MG) BY MOUTH DAILY  Modified Medications   No medications on file  Discontinued Medications   No medications on file    Allergies: Allergies  Allergen Reactions   Latuda [Lurasidone Hcl] Other (See Comments)    Occular dystonia   Seroquel [Quetiapine Fumarate] Swelling    Tongue swells    Benzoin Other (See Comments)    blisters   Latex Rash and Other (See Comments)   Sulfamethoxazole-Trimethoprim Itching and Nausea And Vomiting    Past Medical History: Past Medical History:  Diagnosis Date   Allergy    Anemia    Anxiety    Anxiety state, unspecified 12/07/2012   Arthritis    Asthma    Bipolar disorder, unspecified ([place]) 12/07/2012   Depression    Diabetes mellitus without complication (HCC)    no meds currently- controlled with diet   GERD (gastroesophageal reflux disease)    GERD (gastroesophageal reflux disease) 12/07/2012   Headache(784.0)    migraines   Herpes genitalis in women 12/07/2012   HIV infection ([place])    Human immunodeficiency virus (HIV) disease ([place]) 12/07/2012   Neuromuscular disorder ([place])    carpal tunnel in both arms   SHINGLES 01/31/2009   Qualifier: Diagnosis of  By: Tomma Lightning MD, Claiborne Billings     Type II or unspecified type diabetes mellitus without mention of complication, not stated as uncontrolled 12/07/2012   Unspecified asthma(493.90) 12/07/2012    Social History: Social History   Socioeconomic History   Marital status: Single    Spouse name: Not on file   Number of  children: 1   Years of education: 12   Highest education level: Some college, no degree  Occupational History   Occupation: Nutrition Social research officer, government: [employer]  Tobacco Use   Smoking status: Never   Smokeless tobacco: Never  Vaping Use   Vaping Use: Never used  Substance and Sexual Activity   Alcohol use: Yes    Alcohol/week: 0.0 standard drinks    Comment: OCC.about once a month    Drug use: No   Sexual activity: Never    Partners: Male    Birth control/protection: Implant  Other Topics Concern   Not on file  Social History Narrative  Patient lives alone.   Patient works at home.   Patient has hs education.   Patient has 1 child.   Patient drinks multiple energy drinks and coffees throughout day, daily.      Patient is right-handed. She lives with her daughter in a one level home. She drinks one cup of coffee a day. She works for [employer] in [place] and is very active at work.   Social Determinants of Health   Financial Resource Strain: Not on file  Food Insecurity: Not on file  Transportation Needs: Not on file  Physical Activity: Not on file  Stress: Not on file  Social Connections: Not on file    Labs: Lab Results  Component Value Date   HIV1RNAQUANT 21 (H) 08/20/2021   HIV1RNAQUANT 30 (H) 06/18/2021   HIV1RNAQUANT <20 (H) 02/06/2021   CD4TABS 1,434 08/20/2021   CD4TABS 1,521 05/15/2020   CD4TABS 1,357 08/28/2019    RPR and STI Lab Results  Component Value Date   LABRPR NON-REACTIVE 08/20/2021   LABRPR NON-REACTIVE 02/06/2021   LABRPR NON-REACTIVE 05/15/2020   LABRPR NON-REACTIVE 08/28/2019   LABRPR NON-REACTIVE 04/04/2019    STI Results GC GC CT CT  08/21/2021 10:57 AM Negative    Negative     02/06/2021 10:33 AM Negative    Negative     05/16/2020 10:07 AM Negative    Negative     08/28/2019  4:04 PM Negative    Negative     09/21/2018 12:00 AM Negative    Negative     06/27/2018 12:00 AM  Negative    Negative     01/02/2018 12:00 AM Negative    Negative     10/27/2017 12:00 AM Negative    Negative     08/03/2017 12:00 AM Negative    Negative     04/06/2017 12:00 AM Negative    Negative     08/13/2016 12:00 AM Negative    Negative     02/06/2016 12:00 AM Negative    Negative     11/05/2015 12:00 AM Negative    Negative     06/06/2015 12:00 AM Negative    Negative     12/26/2014 12:00 AM Negative    Negative     02/12/2013 11:13 AM  NEGATIVE    NEGATIVE    06/21/2010 12:52 AM   NEGATIVE (NOTE)  Testing performed using the BD ProbeTec Qx Chlamydia trachomatis and Neisseria gonorrhea amplified DNA assay.  Performed at:  [facility] [address] [place], [place] [address]               [id number]     07/12/2007  8:56 PM   NEGATIVE (NOTE)  Testing performed using the BD Probetec ET Chlamydia trachomatis and Neisseria gonorrhea amplified DNA assay.       Hepatitis B Lab Results  Component Value Date   HEPBSAB REACTIVE (A) 08/03/2017   HEPBSAG NEG 11/10/2006   HEPBCAB NEG 11/10/2006   Hepatitis C No results found for: HEPCAB, HCVRNAPCRQN Hepatitis A No results found for: HAV Lipids: Lab Results  Component Value Date   CHOL 219 (H) 02/06/2021   TRIG 92 02/06/2021   HDL 46 (  L) 02/06/2021   CHOLHDL 4.8 02/06/2021   VLDL 25 04/06/2017   LDLCALC 153 (H) 02/06/2021    TARGET DATE: 6th of the month  Assessment: [name] presents today for their maintenance Cabenuva injections. Past injections were tolerated well without issues. No problems with systemic side effects of injections. Patient did experience soreness that is self resolving and getting better over time. Her appointment was delayed due to the death of her father. She expressed understanding of the no show policy and Dr. Johnnye Sima leaving the practice. She is eager to stay at [facility] although is sad to see Dr. Johnnye Sima leaving.  She also was counseled on the Menveo vaccine and she would like to receive it, however, a family member drove her today and she doesn't want to keep her waiting. HIV RNA and hepatitis A labs were ordered as standard and to assess for immunity.  Administered cabotegravir 600mg /20mL in left upper outer quadrant of the gluteal muscle. Administered rilpivirine 900 mg/65mL in the right upper outer quadrant of the gluteal muscle. Injections were tolerated well. Patient will follow up in 2 months for next set of injections.  Plan: - Cabenuva injections administered - Next injections scheduled for 7/11 with Dr. Rogers Blocker - Call with any issues or questions - Follow up on Hep A results for potential vaccination - Follow up on Menveo at next visit  Varney Daily, PharmD PGY1 Pharmacy Resident [facility]

## 2022-01-05 ENCOUNTER — Other Ambulatory Visit: Payer: Self-pay | Admitting: Allergy

## 2022-01-05 LAB — HEPATITIS A ANTIBODY, TOTAL: Hepatitis A AB,Total: REACTIVE — AB

## 2022-01-05 LAB — HIV-1 RNA QUANT-NO REFLEX-BLD
HIV 1 RNA Quant: NOT DETECTED copies/mL
HIV-1 RNA Quant, Log: NOT DETECTED Log copies/mL

## 2022-01-06 DIAGNOSIS — M5011 Cervical disc disorder with radiculopathy,  high cervical region: Secondary | ICD-10-CM | POA: Diagnosis not present

## 2022-01-06 DIAGNOSIS — M5442 Lumbago with sciatica, left side: Secondary | ICD-10-CM | POA: Diagnosis not present

## 2022-01-06 DIAGNOSIS — M50323 Other cervical disc degeneration at C6-C7 level: Secondary | ICD-10-CM | POA: Diagnosis not present

## 2022-01-06 DIAGNOSIS — M5412 Radiculopathy, cervical region: Secondary | ICD-10-CM | POA: Diagnosis not present

## 2022-01-06 DIAGNOSIS — M542 Cervicalgia: Secondary | ICD-10-CM | POA: Diagnosis not present

## 2022-01-06 DIAGNOSIS — M4722 Other spondylosis with radiculopathy, cervical region: Secondary | ICD-10-CM | POA: Diagnosis not present

## 2022-01-06 DIAGNOSIS — S39012D Strain of muscle, fascia and tendon of lower back, subsequent encounter: Secondary | ICD-10-CM | POA: Diagnosis not present

## 2022-01-06 DIAGNOSIS — S83412A Sprain of medial collateral ligament of left knee, initial encounter: Secondary | ICD-10-CM | POA: Diagnosis not present

## 2022-01-07 ENCOUNTER — Encounter: Payer: Self-pay | Admitting: Infectious Diseases

## 2022-01-08 DIAGNOSIS — S39012A Strain of muscle, fascia and tendon of lower back, initial encounter: Secondary | ICD-10-CM | POA: Diagnosis not present

## 2022-01-08 DIAGNOSIS — M5412 Radiculopathy, cervical region: Secondary | ICD-10-CM | POA: Diagnosis not present

## 2022-02-05 ENCOUNTER — Emergency Department (HOSPITAL_BASED_OUTPATIENT_CLINIC_OR_DEPARTMENT_OTHER): Payer: BC Managed Care – PPO

## 2022-02-05 ENCOUNTER — Other Ambulatory Visit: Payer: Self-pay

## 2022-02-05 ENCOUNTER — Emergency Department (HOSPITAL_BASED_OUTPATIENT_CLINIC_OR_DEPARTMENT_OTHER): Payer: BC Managed Care – PPO | Admitting: Radiology

## 2022-02-05 ENCOUNTER — Encounter (HOSPITAL_BASED_OUTPATIENT_CLINIC_OR_DEPARTMENT_OTHER): Payer: Self-pay

## 2022-02-05 ENCOUNTER — Emergency Department (HOSPITAL_BASED_OUTPATIENT_CLINIC_OR_DEPARTMENT_OTHER)
Admission: EM | Admit: 2022-02-05 | Discharge: 2022-02-05 | Disposition: A | Discharge status: Home / Self Care | Payer: BC Managed Care – PPO | Attending: Emergency Medicine | Admitting: Emergency Medicine

## 2022-02-05 DIAGNOSIS — Z79899 Other long term (current) drug therapy: Secondary | ICD-10-CM | POA: Insufficient documentation

## 2022-02-05 DIAGNOSIS — S199XXA Unspecified injury of neck, initial encounter: Secondary | ICD-10-CM | POA: Diagnosis not present

## 2022-02-05 DIAGNOSIS — Z91040 Latex allergy status: Secondary | ICD-10-CM | POA: Insufficient documentation

## 2022-02-05 DIAGNOSIS — M545 Low back pain, unspecified: Secondary | ICD-10-CM | POA: Diagnosis not present

## 2022-02-05 DIAGNOSIS — J45909 Unspecified asthma, uncomplicated: Secondary | ICD-10-CM | POA: Diagnosis not present

## 2022-02-05 DIAGNOSIS — Z7951 Long term (current) use of inhaled steroids: Secondary | ICD-10-CM | POA: Diagnosis not present

## 2022-02-05 DIAGNOSIS — R519 Headache, unspecified: Secondary | ICD-10-CM | POA: Diagnosis not present

## 2022-02-05 DIAGNOSIS — M542 Cervicalgia: Secondary | ICD-10-CM | POA: Diagnosis not present

## 2022-02-05 DIAGNOSIS — E119 Type 2 diabetes mellitus without complications: Secondary | ICD-10-CM | POA: Insufficient documentation

## 2022-02-05 DIAGNOSIS — M546 Pain in thoracic spine: Secondary | ICD-10-CM | POA: Insufficient documentation

## 2022-02-05 DIAGNOSIS — M40204 Unspecified kyphosis, thoracic region: Secondary | ICD-10-CM | POA: Diagnosis not present

## 2022-02-05 MED ORDER — CYCLOBENZAPRINE HCL 10 MG PO TABS: 10.0000 mg | ORAL_TABLET | Freq: Two times a day (BID) | ORAL | 0 refills | Status: DC | PRN

## 2022-02-05 MED ORDER — IBUPROFEN 800 MG PO TABS
800.0000 mg | ORAL_TABLET | Freq: Once | ORAL | Status: AC
  Administered 2022-02-05: 800 mg via ORAL
  Filled 2022-02-05: qty 1

## 2022-02-05 MED ORDER — METHOCARBAMOL 500 MG PO TABS: 500.0000 mg | ORAL_TABLET | Freq: Two times a day (BID) | ORAL | 0 refills | Status: AC
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
## 2022-02-09 DIAGNOSIS — M542 Cervicalgia: Secondary | ICD-10-CM | POA: Diagnosis not present

## 2022-02-09 DIAGNOSIS — R233 Spontaneous ecchymoses: Secondary | ICD-10-CM | POA: Diagnosis not present

## 2022-02-09 DIAGNOSIS — G2581 Restless legs syndrome: Secondary | ICD-10-CM | POA: Diagnosis not present

## 2022-02-09 DIAGNOSIS — M546 Pain in thoracic spine: Secondary | ICD-10-CM | POA: Diagnosis not present

## 2022-02-23 ENCOUNTER — Ambulatory Visit (INDEPENDENT_AMBULATORY_CARE_PROVIDER_SITE_OTHER): Payer: BC Managed Care – PPO | Admitting: Pharmacist

## 2022-02-23 ENCOUNTER — Other Ambulatory Visit: Payer: Self-pay

## 2022-02-23 DIAGNOSIS — B20 Human immunodeficiency virus [HIV] disease: Secondary | ICD-10-CM

## 2022-02-23 DIAGNOSIS — Z23 Encounter for immunization: Secondary | ICD-10-CM | POA: Diagnosis not present

## 2022-02-23 DIAGNOSIS — R768 Other specified abnormal immunological findings in serum: Secondary | ICD-10-CM | POA: Diagnosis not present

## 2022-02-23 MED ORDER — CABOTEGRAVIR & RILPIVIRINE ER 600 & 900 MG/3ML IM SUER
1.0000 | Freq: Once | INTRAMUSCULAR | Status: AC
  Administered 2022-02-23: 1 via INTRAMUSCULAR

## 2022-02-23 NOTE — Progress Notes (Signed)
HPI: [name] is a 41 y.o. female who presents to the [place] [facility] for [place] administration.  Patient Active Problem List   Diagnosis Date Noted   Insomnia 10/15/2021   Not well controlled moderate persistent asthma 09/28/2021   Seasonal and perennial allergic rhinoconjunctivitis 09/28/2021   Other atopic dermatitis 07/20/2021   Hyperglycemia 08/28/2019   Obesity (BMI 35.0-39.9 without comorbidity) 07/26/2019   Costochondritis, acute 01/02/2018   Bipolar mixed affective disorder, moderate ([place]) 03/20/2017   Chronic tension headaches 01/23/2017   Hyperlipidemia 11/17/2015   Frequent sinus infections 06/30/2015   Hernia, abdominal 08/05/2014   LGSIL of cervix of undetermined significance 10/24/2013   Superficial Liver masses, right lobe; suspected dermoid recurrence.   05/07/2013   Ovarian cystic mass 05/07/2013   s/p Serosal repair of colon 12/04/2012 12/15/2012   Herpes genitalis in women 12/07/2012   Anxiety state, unspecified 12/07/2012   GERD (gastroesophageal reflux disease) 12/07/2012   Generalized anxiety disorder 12/07/2012   Pelvic mass in female s/p Robotic-assisted right salpingo-oophorectomy 12/04/2012   HEMORRHOIDS NOS, W/O COMPLICATIONS 57/08/7791   Depression 11/25/2006   Other allergic rhinitis 11/25/2006   ASTHMA, COUGH VARIANT 11/15/2006   Human immunodeficiency virus (HIV) disease ([place]) 11/10/2006   Urticaria 11/10/2006    Patient's Medications  New Prescriptions   No medications on file  Previous Medications   ALBUTEROL (PROVENTIL) (2.5 MG/3ML) 0.083% NEBULIZER SOLUTION    Take 3 mLs (2.5 mg total) by nebulization every 4 (four) hours as needed for wheezing or shortness of breath (coughing fits).   ALBUTEROL (VENTOLIN HFA) 108 (90 BASE) MCG/ACT INHALER    Inhale 2 puffs into the lungs every 4 (four) hours as needed for wheezing or shortness of breath (coughing fits).   AMPHETAMINE-DEXTROAMPHETAMINE (ADDERALL XR) 30 MG 24 HR CAPSULE     Take by mouth.   AZELASTINE (OPTIVAR) 0.05 % OPHTHALMIC SOLUTION    INSTILL 1 DROP IN BOTH EYES TWICE DAILY AS NEEDED FOR ITCHY OR WATERY EYES   CABOTEGRAVIR & RILPIVIRINE ER (CABENUVA) 600 & 900 MG/3ML INJECTION    Inject 1 kit into the muscle every 2 (two) months.   CETIRIZINE (ZYRTEC) 10 MG TABLET    Take 2 tablets (20 mg total) by mouth at bedtime.   CLINDAMYCIN (CLEOCIN T) 1 % EXTERNAL SOLUTION    USE DAILY FOR PREVENTION OR 2 TIMES DAILY WHEN ACTIVE LESIONS   DENTA 5000 PLUS 1.1 % CREA DENTAL CREAM    USE SMALL AMOUNT ON TEETH AND GUMS EVERY EVENING SPIT OUT EXCESS AND DO NOT RINSE   FLUOCINOLONE ACETONIDE BODY 0.01 % OIL    Apply to scalp once daily as needed for itching/rash.   FLUOXETINE (PROZAC) 40 MG CAPSULE    Take 40 mg by mouth daily.   FLUTICASONE (FLONASE) 50 MCG/ACT NASAL SPRAY    Place 1 spray into both nostrils daily.   FLUTICASONE-SALMETEROL,SENSOR, (AIRDUO DIGIHALER) 232-14 MCG/ACT AEPB    Inhale 1 puff into the lungs in the morning and at bedtime. Rinse mouth after each use.   HYDROCORTISONE 2.5 % CREAM    Place rectally.   KETOCONAZOLE (NIZORAL) 2 % CREAM    APPLY EXTERNALLY TO THE AFFECTED AREA EVERY DAY FOR 2 WEEKS   LEVONORGESTREL (KYLEENA IU)    by Intrauterine route.   METHOCARBAMOL (ROBAXIN) 500 MG TABLET    Take 1 tablet (500 mg total) by mouth 2 (two) times daily.   MONTELUKAST (SINGULAIR) 10 MG TABLET    TAKE 1 TABLET BY MOUTH  AT BEDTIME   OLOPATADINE-MOMETASONE (RYALTRIS) 665-25 MCG/ACT SUSP    Place 1-2 sprays into the nose in the morning and at bedtime.   OMEPRAZOLE (PRILOSEC) 40 MG CAPSULE    Take 1 capsule (40 mg total) by mouth daily. Nothing to eat or drink for 20-30 minutes.   ONDANSETRON (ZOFRAN-ODT) 4 MG DISINTEGRATING TABLET    DISSOLVE 1 TABLET(4 MG) ON THE TONGUE EVERY 8 HOURS AS NEEDED FOR NAUSEA OR VOMITING   OXYMETAZOLINE (AFRIN) 0.05 % NASAL SPRAY    Place 1 spray into both nostrils 2 (two) times daily. For <7 days at a time.    PHENTERMINE-TOPIRAMATE (QSYMIA) 3.75-23 MG CP24    Take 1 tablet by mouth daily.   QVAR REDIHALER 40 MCG/ACT INHALER    INHALE 2 PUFFS INTO THE LUNGS TWICE DAILY   RIMEGEPANT SULFATE (NURTEC) 75 MG TBDP    Take 1 tablet by mouth daily as needed (Maximum 1 tablet in 24 hours).   ROSUVASTATIN (CRESTOR) 20 MG TABLET    Take by mouth.   TIZANIDINE (ZANAFLEX) 4 MG TABLET    Take 4 mg by mouth 3 (three) times daily.   TRETINOIN (RETIN-A) 0.025 % CREAM       TRIAMCINOLONE CREAM (KENALOG) 0.1 %    Apply 1 application topically 2 (two) times daily.   VALACYCLOVIR (VALTREX) 1000 MG TABLET    TAKE 1 TABLET(1000 MG) BY MOUTH DAILY  Modified Medications   No medications on file  Discontinued Medications   No medications on file    Allergies: Allergies  Allergen Reactions   Latuda [Lurasidone Hcl] Other (See Comments)    Occular dystonia   Seroquel [Quetiapine Fumarate] Swelling    Tongue swells    Benzoin Other (See Comments)    blisters   Latex Rash and Other (See Comments)   Sulfamethoxazole-Trimethoprim Itching and Nausea And Vomiting    Past Medical History: Past Medical History:  Diagnosis Date   Allergy    Anemia    Anxiety    Anxiety state, unspecified 12/07/2012   Arthritis    Asthma    Bipolar disorder, unspecified ([place]) 12/07/2012   Depression    Diabetes mellitus without complication (HCC)    no meds currently- controlled with diet   GERD (gastroesophageal reflux disease)    GERD (gastroesophageal reflux disease) 12/07/2012   Headache(784.0)    migraines   Herpes genitalis in women 12/07/2012   HIV infection ([place])    Human immunodeficiency virus (HIV) disease ([place]) 12/07/2012   Neuromuscular disorder ([place])    carpal tunnel in both arms   SHINGLES 01/31/2009   Qualifier: Diagnosis of  By: Tomma Lightning MD, Claiborne Billings     Type II or unspecified type diabetes mellitus without mention of complication, not stated as uncontrolled 12/07/2012   Unspecified asthma(493.90) 12/07/2012     Social History: Social History   Socioeconomic History   Marital status: Single    Spouse name: Not on file   Number of children: 1   Years of education: 12   Highest education level: Some college, no degree  Occupational History   Occupation: Nutrition Social research officer, government: [employer]  Tobacco Use   Smoking status: Never   Smokeless tobacco: Never  Vaping Use   Vaping Use: Never used  Substance and Sexual Activity   Alcohol use: Yes    Alcohol/week: 0.0 standard drinks of alcohol    Comment: OCC.about once a month    Drug use: No   Sexual activity:  Never    Partners: Male    Birth control/protection: Implant  Other Topics Concern   Not on file  Social History Narrative   Patient lives alone.   Patient works at home.   Patient has hs education.   Patient has 1 child.   Patient drinks multiple energy drinks and coffees throughout day, daily.      Patient is right-handed. She lives with her daughter in a one level home. She drinks one cup of coffee a day. She works for [employer] in [place] and is very active at work.   Social Determinants of Health   Financial Resource Strain: Not on file  Food Insecurity: Not on file  Transportation Needs: Not on file  Physical Activity: Not on file  Stress: Not on file  Social Connections: Not on file    Labs: Lab Results  Component Value Date   HIV1RNAQUANT NOT DETECTED 01/01/2022   HIV1RNAQUANT 21 (H) 08/20/2021   HIV1RNAQUANT 30 (H) 06/18/2021   CD4TABS 1,434 08/20/2021   CD4TABS 1,521 05/15/2020   CD4TABS 1,357 08/28/2019    RPR and STI Lab Results  Component Value Date   LABRPR NON-REACTIVE 08/20/2021   LABRPR NON-REACTIVE 02/06/2021   LABRPR NON-REACTIVE 05/15/2020   LABRPR NON-REACTIVE 08/28/2019   LABRPR NON-REACTIVE 04/04/2019    STI Results GC GC CT CT  08/21/2021 10:57 AM Negative   Negative    02/06/2021 10:33 AM Negative   Negative    05/16/2020 10:07 AM  Negative   Negative    08/28/2019  4:04 PM Negative   Negative    09/21/2018 12:00 AM Negative   Negative    06/27/2018 12:00 AM Negative   Negative    01/02/2018 12:00 AM Negative   Negative    10/27/2017 12:00 AM Negative   Negative    08/03/2017 12:00 AM Negative   Negative    04/06/2017 12:00 AM Negative   Negative    08/13/2016 12:00 AM Negative   Negative    02/06/2016 12:00 AM Negative   Negative    11/05/2015 12:00 AM Negative   Negative    06/06/2015 12:00 AM Negative   Negative    12/26/2014 12:00 AM Negative   Negative    02/12/2013 11:13 AM  NEGATIVE   NEGATIVE   06/21/2010 12:52 AM   NEGATIVE (NOTE)  Testing performed using the BD ProbeTec Qx Chlamydia trachomatis and Neisseria gonorrhea amplified DNA assay.  Performed at:  [facility] [place] [address], [place] [address]               [id number]    07/12/2007  8:56 PM   NEGATIVE (NOTE)  Testing performed using the BD Probetec ET Chlamydia trachomatis and Neisseria gonorrhea amplified DNA assay.      Hepatitis B Lab Results  Component Value Date   HEPBSAB REACTIVE (A) 08/03/2017   HEPBSAG NEG 11/10/2006   HEPBCAB NEG 11/10/2006   Hepatitis C No results found for: "HEPCAB", "HCVRNAPCRQN" Hepatitis A Lab Results  Component Value Date   HAV REACTIVE (A) 01/01/2022   Lipids: Lab Results  Component Value Date   CHOL 219 (H) 02/06/2021   TRIG 92 02/06/2021  HDL 46 (L) 02/06/2021   CHOLHDL 4.8 02/06/2021   VLDL 25 04/06/2017   LDLCALC 153 (H) 02/06/2021    TARGET DATE:  The 6th of the month  Current HIV Regimen: Cabenuva  Assessment: Bessie presents today for their maintenance Cabenuva injections. Initial/past injections were tolerated well without issues. No problems with systemic effects of injections. Administered cabotegravir 656m/3mL in left upper outer quadrant of the gluteal muscle. Administered  rilpivirine 900 mg/311min the right upper outer quadrant of the gluteal muscle. Monitored patient for 10 minutes after injection. Injections were tolerated well without issue. Patient will follow up in 2 months for next injection.  [place], and her new job requires an updated viral load, quantiferon test, hepatitis C test, and CD4 count. Will check all of these today. She also requests RPR testing today. States she is [place] looking for a new clinic [place] injection if she has not found a clinic by then. Scheduled her with Dr. WaJuleen China Patient also received her meningitis booster vaccine today.   Plan: - Cabenuva injections administered - Check HIV RNA, hepatitis C antibody, RPR, quantiferon TB test, and CD4 count - Follow-up with Dr. WaJuleen Chinan 9/12  - Administer Menveo - Call with any issues or questions  AmAlfonse SprucePharmD, CPP Clinical Pharmacist Practitioner [place]

## 2022-02-24 LAB — T-HELPER CELLS (CD4) COUNT (NOT AT ARMC)
CD4 % Helper T Cell: 39 % (ref 33–65)
CD4 T Cell Abs: 1061 /uL (ref 400–1790)

## 2022-02-25 LAB — QUANTIFERON-TB GOLD PLUS
Mitogen-NIL: 8.97 IU/mL
NIL: 0.04 IU/mL
QuantiFERON-TB Gold Plus: NEGATIVE
TB1-NIL: 0.01 IU/mL
TB2-NIL: 0.01 IU/mL

## 2022-02-25 LAB — HIV-1 RNA QUANT-NO REFLEX-BLD
HIV 1 RNA Quant: <20 Copies/mL — ABNORMAL HIGH
HIV-1 RNA Quant, Log: <1.3 Log cps/mL — ABNORMAL HIGH

## 2022-02-25 LAB — RPR: RPR Ser Ql: NONREACTIVE

## 2022-02-25 LAB — HEPATITIS C ANTIBODY: Hepatitis C Ab: NONREACTIVE

## 2022-03-22 DIAGNOSIS — M542 Cervicalgia: Secondary | ICD-10-CM | POA: Diagnosis not present

## 2022-03-24 ENCOUNTER — Telehealth: Payer: Self-pay

## 2022-03-24 NOTE — Telephone Encounter (Signed)
Received MyChart scheduling request from patient for "breathing."  Called patient to further assess. States she had a sinus infection a few weeks ago and was treated with antibiotics, but has not yet fully recovered. Reports that her asthma has flared up and she was prescribed an Advair inhaler along with a 5 day course of prednisone, but is still experiencing coughing and shortness of breath.   Reports that her O2 sats are "perfect," but that she is doing breathing treatments at home.   She says she sent a MyChart scheduling request for her PCP, but soonest availability was not until the end of September. Recommended she call her PCP's office and describe her symptoms, as primary care often has sick visits reserved for sooner availability.   Advised that if she is not able to get in with her PCP soon that she should go to urgent care for further evaluation and treatment.  Patient verbalized understanding and has no further questions.   Beryle Flock, RN

## 2022-04-01 ENCOUNTER — Encounter: Payer: Self-pay | Admitting: Internal Medicine

## 2022-04-03 DIAGNOSIS — B9689 Other specified bacterial agents as the cause of diseases classified elsewhere: Secondary | ICD-10-CM | POA: Diagnosis not present

## 2022-04-03 DIAGNOSIS — N898 Other specified noninflammatory disorders of vagina: Secondary | ICD-10-CM | POA: Diagnosis not present

## 2022-04-03 DIAGNOSIS — J4521 Mild intermittent asthma with (acute) exacerbation: Secondary | ICD-10-CM | POA: Diagnosis not present

## 2022-04-03 DIAGNOSIS — J208 Acute bronchitis due to other specified organisms: Secondary | ICD-10-CM | POA: Diagnosis not present

## 2022-04-14 DIAGNOSIS — L732 Hidradenitis suppurativa: Secondary | ICD-10-CM | POA: Diagnosis not present

## 2022-04-14 DIAGNOSIS — D2371 Other benign neoplasm of skin of right lower limb, including hip: Secondary | ICD-10-CM | POA: Diagnosis not present

## 2022-04-14 DIAGNOSIS — L209 Atopic dermatitis, unspecified: Secondary | ICD-10-CM | POA: Diagnosis not present

## 2022-04-14 DIAGNOSIS — D2372 Other benign neoplasm of skin of left lower limb, including hip: Secondary | ICD-10-CM | POA: Diagnosis not present

## 2022-04-15 ENCOUNTER — Encounter: Payer: BC Managed Care – PPO | Admitting: Pharmacist

## 2022-04-22 DIAGNOSIS — M542 Cervicalgia: Secondary | ICD-10-CM | POA: Diagnosis not present

## 2022-04-22 DIAGNOSIS — S39012D Strain of muscle, fascia and tendon of lower back, subsequent encounter: Secondary | ICD-10-CM | POA: Diagnosis not present

## 2022-04-22 DIAGNOSIS — N921 Excessive and frequent menstruation with irregular cycle: Secondary | ICD-10-CM | POA: Diagnosis not present

## 2022-04-22 DIAGNOSIS — N898 Other specified noninflammatory disorders of vagina: Secondary | ICD-10-CM | POA: Diagnosis not present

## 2022-04-22 DIAGNOSIS — T385X5A Adverse effect of other estrogens and progestogens, initial encounter: Secondary | ICD-10-CM | POA: Diagnosis not present

## 2022-04-22 DIAGNOSIS — M7918 Myalgia, other site: Secondary | ICD-10-CM | POA: Diagnosis not present

## 2022-04-22 DIAGNOSIS — N938 Other specified abnormal uterine and vaginal bleeding: Secondary | ICD-10-CM | POA: Diagnosis not present

## 2022-04-27 ENCOUNTER — Encounter: Payer: BC Managed Care – PPO | Admitting: Internal Medicine

## 2022-04-30 DIAGNOSIS — M5412 Radiculopathy, cervical region: Secondary | ICD-10-CM | POA: Diagnosis not present

## 2022-05-04 ENCOUNTER — Ambulatory Visit (INDEPENDENT_AMBULATORY_CARE_PROVIDER_SITE_OTHER): Payer: BC Managed Care – PPO | Admitting: Pharmacist

## 2022-05-04 ENCOUNTER — Other Ambulatory Visit: Payer: Self-pay

## 2022-05-04 DIAGNOSIS — B20 Human immunodeficiency virus [HIV] disease: Secondary | ICD-10-CM

## 2022-05-04 MED ORDER — CABOTEGRAVIR & RILPIVIRINE ER 600 & 900 MG/3ML IM SUER
1.0000 | Freq: Once | INTRAMUSCULAR | Status: AC
  Administered 2022-05-04: 1 via INTRAMUSCULAR

## 2022-05-04 NOTE — Progress Notes (Signed)
HPI: [name] is a 41 y.o. female who presents to the [place] clinic for [place] administration.  Patient Active Problem List   Diagnosis Date Noted   Insomnia 10/15/2021   Not well controlled moderate persistent asthma 09/28/2021   Seasonal and perennial allergic rhinoconjunctivitis 09/28/2021   Other atopic dermatitis 07/20/2021   Hyperglycemia 08/28/2019   Obesity (BMI 35.0-39.9 without comorbidity) 07/26/2019   Costochondritis, acute 01/02/2018   Bipolar mixed affective disorder, moderate ([place]) 03/20/2017   Chronic tension headaches 01/23/2017   Hyperlipidemia 11/17/2015   Frequent sinus infections 06/30/2015   Hernia, abdominal 08/05/2014   LGSIL of cervix of undetermined significance 10/24/2013   Superficial Liver masses, right lobe; suspected dermoid recurrence.   05/07/2013   Ovarian cystic mass 05/07/2013   s/p Serosal repair of colon 12/04/2012 12/15/2012   Herpes genitalis in women 12/07/2012   Anxiety state, unspecified 12/07/2012   GERD (gastroesophageal reflux disease) 12/07/2012   Generalized anxiety disorder 12/07/2012   Pelvic mass in female s/p Robotic-assisted right salpingo-oophorectomy 12/04/2012   HEMORRHOIDS NOS, W/O COMPLICATIONS 57/08/7791   Depression 11/25/2006   Other allergic rhinitis 11/25/2006   ASTHMA, COUGH VARIANT 11/15/2006   Human immunodeficiency virus (HIV) disease ([place]) 11/10/2006   Urticaria 11/10/2006    Patient's Medications  New Prescriptions   No medications on file  Previous Medications   ALBUTEROL (PROVENTIL) (2.5 MG/3ML) 0.083% NEBULIZER SOLUTION    Take 3 mLs (2.5 mg total) by nebulization every 4 (four) hours as needed for wheezing or shortness of breath (coughing fits).   ALBUTEROL (VENTOLIN HFA) 108 (90 BASE) MCG/ACT INHALER    Inhale 2 puffs into the lungs every 4 (four) hours as needed for wheezing or shortness of breath (coughing fits).   AMPHETAMINE-DEXTROAMPHETAMINE (ADDERALL XR) 30 MG 24 HR CAPSULE     Take by mouth.   AZELASTINE (OPTIVAR) 0.05 % OPHTHALMIC SOLUTION    INSTILL 1 DROP IN BOTH EYES TWICE DAILY AS NEEDED FOR ITCHY OR WATERY EYES   CABOTEGRAVIR & RILPIVIRINE ER (CABENUVA) 600 & 900 MG/3ML INJECTION    Inject 1 kit into the muscle every 2 (two) months.   CETIRIZINE (ZYRTEC) 10 MG TABLET    Take 2 tablets (20 mg total) by mouth at bedtime.   CLINDAMYCIN (CLEOCIN T) 1 % EXTERNAL SOLUTION    USE DAILY FOR PREVENTION OR 2 TIMES DAILY WHEN ACTIVE LESIONS   DENTA 5000 PLUS 1.1 % CREA DENTAL CREAM    USE SMALL AMOUNT ON TEETH AND GUMS EVERY EVENING SPIT OUT EXCESS AND DO NOT RINSE   FLUOCINOLONE ACETONIDE BODY 0.01 % OIL    Apply to scalp once daily as needed for itching/rash.   FLUOXETINE (PROZAC) 40 MG CAPSULE    Take 40 mg by mouth daily.   FLUTICASONE (FLONASE) 50 MCG/ACT NASAL SPRAY    Place 1 spray into both nostrils daily.   FLUTICASONE-SALMETEROL,SENSOR, (AIRDUO DIGIHALER) 232-14 MCG/ACT AEPB    Inhale 1 puff into the lungs in the morning and at bedtime. Rinse mouth after each use.   HYDROCORTISONE 2.5 % CREAM    Place rectally.   KETOCONAZOLE (NIZORAL) 2 % CREAM    APPLY EXTERNALLY TO THE AFFECTED AREA EVERY DAY FOR 2 WEEKS   LEVONORGESTREL (KYLEENA IU)    by Intrauterine route.   METHOCARBAMOL (ROBAXIN) 500 MG TABLET    Take 1 tablet (500 mg total) by mouth 2 (two) times daily.   MONTELUKAST (SINGULAIR) 10 MG TABLET    TAKE 1 TABLET BY MOUTH  AT BEDTIME   OLOPATADINE-MOMETASONE (RYALTRIS) 665-25 MCG/ACT SUSP    Place 1-2 sprays into the nose in the morning and at bedtime.   OMEPRAZOLE (PRILOSEC) 40 MG CAPSULE    Take 1 capsule (40 mg total) by mouth daily. Nothing to eat or drink for 20-30 minutes.   ONDANSETRON (ZOFRAN-ODT) 4 MG DISINTEGRATING TABLET    DISSOLVE 1 TABLET(4 MG) ON THE TONGUE EVERY 8 HOURS AS NEEDED FOR NAUSEA OR VOMITING   OXYMETAZOLINE (AFRIN) 0.05 % NASAL SPRAY    Place 1 spray into both nostrils 2 (two) times daily. For <7 days at a time.    PHENTERMINE-TOPIRAMATE (QSYMIA) 3.75-23 MG CP24    Take 1 tablet by mouth daily.   QVAR REDIHALER 40 MCG/ACT INHALER    INHALE 2 PUFFS INTO THE LUNGS TWICE DAILY   RIMEGEPANT SULFATE (NURTEC) 75 MG TBDP    Take 1 tablet by mouth daily as needed (Maximum 1 tablet in 24 hours).   ROSUVASTATIN (CRESTOR) 20 MG TABLET    Take by mouth.   TIZANIDINE (ZANAFLEX) 4 MG TABLET    Take 4 mg by mouth 3 (three) times daily.   TRETINOIN (RETIN-A) 0.025 % CREAM       TRIAMCINOLONE CREAM (KENALOG) 0.1 %    Apply 1 application topically 2 (two) times daily.   VALACYCLOVIR (VALTREX) 1000 MG TABLET    TAKE 1 TABLET(1000 MG) BY MOUTH DAILY  Modified Medications   No medications on file  Discontinued Medications   No medications on file    Allergies: Allergies  Allergen Reactions   Latuda [Lurasidone Hcl] Other (See Comments)    Occular dystonia   Seroquel [Quetiapine Fumarate] Swelling    Tongue swells    Benzoin Other (See Comments)    blisters   Latex Rash and Other (See Comments)   Sulfamethoxazole-Trimethoprim Itching and Nausea And Vomiting    Past Medical History: Past Medical History:  Diagnosis Date   Allergy    Anemia    Anxiety    Anxiety state, unspecified 12/07/2012   Arthritis    Asthma    Bipolar disorder, unspecified ([place]) 12/07/2012   Depression    Diabetes mellitus without complication (HCC)    no meds currently- controlled with diet   GERD (gastroesophageal reflux disease)    GERD (gastroesophageal reflux disease) 12/07/2012   Headache(784.0)    migraines   Herpes genitalis in women 12/07/2012   HIV infection ([place])    Human immunodeficiency virus (HIV) disease ([place]) 12/07/2012   Neuromuscular disorder ([place])    carpal tunnel in both arms   SHINGLES 01/31/2009   Qualifier: Diagnosis of  By: Tomma Lightning MD, Claiborne Billings     Type II or unspecified type diabetes mellitus without mention of complication, not stated as uncontrolled 12/07/2012   Unspecified asthma(493.90) 12/07/2012     Social History: Social History   Socioeconomic History   Marital status: Single    Spouse name: Not on file   Number of children: 1   Years of education: 12   Highest education level: Some college, no degree  Occupational History   Occupation: Nutrition Social research officer, government: [employer]  Tobacco Use   Smoking status: Never   Smokeless tobacco: Never  Vaping Use   Vaping Use: Never used  Substance and Sexual Activity   Alcohol use: Yes    Alcohol/week: 0.0 standard drinks of alcohol    Comment: OCC.about once a month    Drug use: No   Sexual activity:  Never    Partners: Male    Birth control/protection: Implant  Other Topics Concern   Not on file  Social History Narrative   Patient lives alone.   Patient works at home.   Patient has hs education.   Patient has 1 child.   Patient drinks multiple energy drinks and coffees throughout day, daily.      Patient is right-handed. She lives with her daughter in a one level home. She drinks one cup of coffee a day. She works for [employer] in [place] and is very active at work.   Social Determinants of Health   Financial Resource Strain: Not on file  Food Insecurity: Not on file  Transportation Needs: Not on file  Physical Activity: Not on file  Stress: Not on file  Social Connections: Not on file    Labs: Lab Results  Component Value Date   HIV1RNAQUANT <20 (H) 02/23/2022   HIV1RNAQUANT NOT DETECTED 01/01/2022   HIV1RNAQUANT 21 (H) 08/20/2021   CD4TABS 1,061 02/23/2022   CD4TABS 1,434 08/20/2021   CD4TABS 1,521 05/15/2020    RPR and STI Lab Results  Component Value Date   LABRPR NON-REACTIVE 02/23/2022   LABRPR NON-REACTIVE 08/20/2021   LABRPR NON-REACTIVE 02/06/2021   LABRPR NON-REACTIVE 05/15/2020   LABRPR NON-REACTIVE 08/28/2019    STI Results GC GC CT CT  08/21/2021 10:57 AM Negative   Negative    02/06/2021 10:33 AM Negative   Negative    05/16/2020 10:07 AM  Negative   Negative    08/28/2019  4:04 PM Negative   Negative    09/21/2018 12:00 AM Negative   Negative    06/27/2018 12:00 AM Negative   Negative    01/02/2018 12:00 AM Negative   Negative    10/27/2017 12:00 AM Negative   Negative    08/03/2017 12:00 AM Negative   Negative    04/06/2017 12:00 AM Negative   Negative    08/13/2016 12:00 AM Negative   Negative    02/06/2016 12:00 AM Negative   Negative    11/05/2015 12:00 AM Negative   Negative    06/06/2015 12:00 AM Negative   Negative    12/26/2014 12:00 AM Negative   Negative    02/12/2013 11:13 AM  NEGATIVE   NEGATIVE   06/21/2010 12:52 AM   NEGATIVE (NOTE)  Testing performed using the BD ProbeTec Qx Chlamydia trachomatis and Neisseria gonorrhea amplified DNA assay.  Performed at:  [facility] [address], [place] [address]               [id number]    07/12/2007  8:56 PM   NEGATIVE (NOTE)  Testing performed using the BD Probetec ET Chlamydia trachomatis and Neisseria gonorrhea amplified DNA assay.      Hepatitis B Lab Results  Component Value Date   HEPBSAB REACTIVE (A) 08/03/2017   HEPBSAG NEG 11/10/2006   HEPBCAB NEG 11/10/2006   Hepatitis C Lab Results  Component Value Date   HEPCAB NON-REACTIVE 02/23/2022   Hepatitis A Lab Results  Component Value Date   HAV REACTIVE (A) 01/01/2022   Lipids: Lab Results  Component Value Date   CHOL 219 (H) 02/06/2021  TRIG 92 02/06/2021   HDL 46 (L) 02/06/2021   CHOLHDL 4.8 02/06/2021   VLDL 25 04/06/2017   LDLCALC 153 (H) 02/06/2021    TARGET DATE:  The 6th of the month  Current HIV Regimen: Cabenuva  Assessment: [name] presents today for their maintenance Cabenuva injections. Initial/past injections were tolerated well without issues. No problems with systemic effects of injections. Aubrei is past her injection window today; the last day within her window was  04/28/22. States she missed her appointment with Dr. Juleen China on 04/27/22 as her daughter was admitted to the emergency room. She called before her appointment and left a voicemail requesting reschedule. She states she never received a call back. Her appointment was thus incorrectly listed as a no show when it should have been listed as a cancelled appointment. Provided Cardell Peach our pharmacy card and asked her to call us directly for rescheduling so we can make sure she is rescheduled within her window. Will check viral load today to ensure she remains undetectable.   Administered cabotegravir 649m/3mL in left upper outer quadrant of the gluteal muscle. Administered rilpivirine 900 mg/32min the right upper outer quadrant of the gluteal muscle. Monitored patient for 10 minutes after injection. Injections were tolerated well without issue. Patient will follow up in 2 months for next injection.[place] the [place] clinics [place] wants to transition to will not accept new patients until January. They are supposed to call her in November for scheduling. Will schedule her with me in November as Dr. WaJuleen Chinas not at the clinic within her target window. Reviewed this should ideally be her last visit with [place] can see her in January if she cannot get an appointment with these clinics until February or March.  Plan: - Cabenuva injections administered - Check HIV RNA - Next injections scheduled for 11/8 with me  - Follow-up on transitioning to ID clinic in [place]- Call with any issues or questions  AmAlfonse SprucePharmD, CPP, BCOro Valleylinical Pharmacist Practitioner [place] Infectious Disease

## 2022-05-07 LAB — HIV-1 RNA QUANT-NO REFLEX-BLD
HIV 1 RNA Quant: NOT DETECTED Copies/mL
HIV-1 RNA Quant, Log: NOT DETECTED Log cps/mL

## 2022-05-12 DIAGNOSIS — T385X5A Adverse effect of other estrogens and progestogens, initial encounter: Secondary | ICD-10-CM | POA: Diagnosis not present

## 2022-05-12 DIAGNOSIS — N939 Abnormal uterine and vaginal bleeding, unspecified: Secondary | ICD-10-CM | POA: Diagnosis not present

## 2022-05-12 DIAGNOSIS — Z90721 Acquired absence of ovaries, unilateral: Secondary | ICD-10-CM | POA: Diagnosis not present

## 2022-05-12 DIAGNOSIS — N938 Other specified abnormal uterine and vaginal bleeding: Secondary | ICD-10-CM | POA: Diagnosis not present

## 2022-05-12 DIAGNOSIS — Z975 Presence of (intrauterine) contraceptive device: Secondary | ICD-10-CM | POA: Diagnosis not present

## 2022-05-17 ENCOUNTER — Emergency Department (HOSPITAL_BASED_OUTPATIENT_CLINIC_OR_DEPARTMENT_OTHER)
Admission: EM | Admit: 2022-05-17 | Discharge: 2022-05-18 | Disposition: A | Discharge status: Home / Self Care | Payer: BC Managed Care – PPO | Attending: Emergency Medicine | Admitting: Emergency Medicine

## 2022-05-17 ENCOUNTER — Other Ambulatory Visit: Payer: Self-pay

## 2022-05-17 ENCOUNTER — Encounter (HOSPITAL_BASED_OUTPATIENT_CLINIC_OR_DEPARTMENT_OTHER): Payer: Self-pay | Admitting: Pediatrics

## 2022-05-17 DIAGNOSIS — Z21 Asymptomatic human immunodeficiency virus [HIV] infection status: Secondary | ICD-10-CM | POA: Insufficient documentation

## 2022-05-17 DIAGNOSIS — R202 Paresthesia of skin: Secondary | ICD-10-CM | POA: Diagnosis not present

## 2022-05-17 DIAGNOSIS — Z91040 Latex allergy status: Secondary | ICD-10-CM | POA: Insufficient documentation

## 2022-05-17 DIAGNOSIS — M545 Low back pain, unspecified: Secondary | ICD-10-CM | POA: Insufficient documentation
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
## 2022-05-18 MED ORDER — PREDNISONE 50 MG PO TABS: 50.0000 mg | ORAL_TABLET | Freq: Every day | ORAL | 0 refills | Status: DC

## 2022-05-18 MED ORDER — PREDNISONE 50 MG PO TABS
60.0000 mg | ORAL_TABLET | Freq: Once | ORAL | Status: AC
  Administered 2022-05-18: 60 mg via ORAL
  Filled 2022-05-18: qty 1

## 2022-05-18 NOTE — ED Notes (Signed)
D/c paperwork reviewed with pt, including prescription. No questions or concerns at time of d/c. Pt ambulatory to steady gait to ED exit.

## 2022-05-18 NOTE — Discharge Instructions (Signed)
Your evaluation did not show any signs of pinched nerve.  Please continue your medications for chronic pain.  I have given you a prescription for short course of prednisone to see if it will help.  I recommend that you follow-up with the neurologist for further evaluation, return if symptoms are getting worse.

## 2022-05-18 NOTE — ED Provider Notes (Signed)
[facility] EMERGENCY DEPARTMENT Provider Note   CSN: [id number] Arrival date & time: 05/17/22  1801     History  Chief Complaint  Patient presents with   Back Pain   Leg Pain   Numbness    [name] is a 41 y.o. female.  The history is provided by the patient.  Back Pain Associated symptoms: leg pain   Leg Pain Associated symptoms: back pain   She has history of diabetes, hyperlipidemia, bipolar disorder, HIV disease and comes in complaining of low back pain and numbness in both legs and right arm.  She has chronic back pain but states that this is different.  She states her legs feel weak but generally weak, no specific activity that she has not been able to do because of the weakness.  She also has noted numbness in her right arm.  These new symptoms have been present for the last 3 days and are worsening.  She has not done anything to treat.  She denies any bowel or bladder dysfunction.  She is a chronic pain patient and is scheduled to receive cervical injections in 4 days.   Home Medications Prior to Admission medications   Medication Sig Start Date End Date Taking? Authorizing Provider  albuterol (PROVENTIL) (2.5 MG/3ML) 0.083% nebulizer solution Take 3 mLs (2.5 mg total) by nebulization every 4 (four) hours as needed for wheezing or shortness of breath (coughing fits). 07/16/21   Garnet Sierras, DO  albuterol (VENTOLIN HFA) 108 (90 Base) MCG/ACT inhaler Inhale 2 puffs into the lungs every 4 (four) hours as needed for wheezing or shortness of breath (coughing fits). 07/16/21   Garnet Sierras, DO  amphetamine-dextroamphetamine (ADDERALL XR) 30 MG 24 hr capsule Take by mouth. 08/28/21   [provider]  azelastine (OPTIVAR) 0.05 % ophthalmic solution INSTILL 1 DROP IN BOTH EYES TWICE DAILY AS NEEDED FOR ITCHY OR WATERY EYES 01/05/22   Garnet Sierras, DO  cabotegravir & rilpivirine ER (CABENUVA) 600 & 900 MG/3ML injection Inject 1 kit into the muscle every 2 (two)  months. 05/21/21   Esmond Plants, RPH-CPP  cetirizine (ZYRTEC) 10 MG tablet Take 2 tablets (20 mg total) by mouth at bedtime. Patient not taking: Reported on 09/28/2021 04/10/18   [name], FNP  clindamycin (CLEOCIN T) 1 % external solution USE DAILY FOR PREVENTION OR 2 TIMES DAILY WHEN ACTIVE LESIONS 05/28/20   [provider]  DENTA 5000 PLUS 1.1 % CREA dental cream USE SMALL AMOUNT ON TEETH AND GUMS EVERY EVENING SPIT OUT EXCESS AND DO NOT RINSE 03/03/17   [provider]  Fluocinolone Acetonide Body 0.01 % OIL Apply to scalp once daily as needed for itching/rash. 07/15/20   [provider]  FLUoxetine (PROZAC) 40 MG capsule Take 40 mg by mouth daily.    [provider]  fluticasone (FLONASE) 50 MCG/ACT nasal spray Place 1 spray into both nostrils daily. 10/15/21   Campbell Riches, MD  Fluticasone-Salmeterol,sensor, (AIRDUO DIGIHALER) [phone] MCG/ACT AEPB Inhale 1 puff into the lungs in the morning and at bedtime. Rinse mouth after each use. 09/28/21   Garnet Sierras, DO  hydrocortisone 2.5 % cream Place rectally. 05/28/19   [provider]  ketoconazole (NIZORAL) 2 % cream APPLY EXTERNALLY TO THE AFFECTED AREA EVERY DAY FOR 2 WEEKS Patient not taking: Reported on 09/28/2021 01/01/19   [provider]  Levonorgestrel (KYLEENA IU) by Intrauterine route.    [provider]  methocarbamol (  ROBAXIN) 500 MG tablet Take 1 tablet (500 mg total) by mouth 2 (two) times daily. 02/05/22   Azucena Cecil, PA-C  montelukast (SINGULAIR) 10 MG tablet TAKE 1 TABLET BY MOUTH AT BEDTIME 11/12/21   Campbell Riches, MD  Olopatadine-Mometasone (RYALTRIS) [phone] MCG/ACT SUSP Place 1-2 sprays into the nose in the morning and at bedtime. 09/28/21   Garnet Sierras, DO  omeprazole (PRILOSEC) 40 MG capsule Take 1 capsule (40 mg total) by mouth daily. Nothing to eat or drink for 20-30 minutes. 09/28/21   Garnet Sierras, DO  ondansetron (ZOFRAN-ODT) 4 MG  disintegrating tablet DISSOLVE 1 TABLET(4 MG) ON THE TONGUE EVERY 8 HOURS AS NEEDED FOR NAUSEA OR VOMITING Patient not taking: Reported on 09/28/2021 12/21/19   Pieter Partridge, DO  oxymetazoline (AFRIN) 0.05 % nasal spray Place 1 spray into both nostrils 2 (two) times daily. For <7 days at a time.    [provider]  Phentermine-Topiramate (QSYMIA) 3.75-23 MG CP24 Take 1 tablet by mouth daily. 11/19/21   Campbell Riches, MD  QVAR REDIHALER 40 MCG/ACT inhaler INHALE 2 PUFFS INTO THE LUNGS TWICE DAILY 05/29/20   Campbell Riches, MD  Rimegepant Sulfate (NURTEC) 75 MG TBDP Take 1 tablet by mouth daily as needed (Maximum 1 tablet in 24 hours). Patient not taking: Reported on 09/28/2021 10/02/19   Pieter Partridge, DO  rosuvastatin (CRESTOR) 20 MG tablet Take by mouth. 08/14/21   [provider]  tiZANidine (ZANAFLEX) 4 MG tablet Take 4 mg by mouth 3 (three) times daily. 11/26/20   [provider]  tretinoin (RETIN-A) 0.025 % cream  11/24/20   [provider]  triamcinolone cream (KENALOG) 0.1 % Apply 1 application topically 2 (two) times daily. 08/19/17   Scot Jun, FNP  valACYclovir (VALTREX) 1000 MG tablet TAKE 1 TABLET(1000 MG) BY MOUTH DAILY 07/08/20   Campbell Riches, MD      Allergies    Anette Guarneri [lurasidone hcl], Seroquel [quetiapine fumarate], Benzoin, Latex, and Sulfamethoxazole-trimethoprim    Review of Systems   Review of Systems  Musculoskeletal:  Positive for back pain.  All other systems reviewed and are negative. She has history of diabetes, hyperlipidemia, bipolar disorder, HIV disease  Physical Exam Updated Vital Signs BP (!) 140/79 (BP Location: Right Arm)   Pulse 73   Temp 97.6 F (36.4 C) (Oral)   Resp 16   Ht 5' 4" (1.626 m)   Wt 96.2 kg   SpO2 99%   BMI 36.39 kg/m  Physical Exam Vitals and nursing note reviewed.   41 year old female, resting comfortably and in no acute distress. Vital signs are significant for borderline  elevated blood pressure. Oxygen saturation is 99%, which is normal. Head is normocephalic and atraumatic. PERRLA, EOMI. Oropharynx is clear. Neck is nontender and supple without adenopathy or JVD. Back is nontender and there is no CVA tenderness.  Straight leg raise is negative. Lungs are clear without rales, wheezes, or rhonchi. Chest is nontender. Heart has regular rate and rhythm without murmur. Abdomen is soft, flat, nontender. Extremities have no cyanosis or edema, full range of motion is present. Skin is warm and dry without rash. Neurologic: Mental status is normal, cranial nerves are intact.  There is no pronator drift.  Strength is 5/5 in all muscle groups in all 4 extremities.  Muscle groups tested included elbow flexion, elbow extension, grip strength, hip flexion, knee flexion, knee extension, plantarflexion, dorsiflexion.  She notices slight decrease sensation  in the left calf compared with the right but normal bilateral sensation in the feet and thighs.  However, when I compare sensation in the thigh and calf on the left, she states it is the same.  There is no extinction on double simultaneous stimulation.  Sensation in the arms is normal.  Knee jerk and ankle jerk reflexes are 2+ bilaterally.  ED Results / Procedures / Treatments    Procedures Procedures  Cardiac monitor shows normal sinus rhythm, per my interpretation.  Medications Ordered in ED Medications  predniSONE (DELTASONE) tablet 60 mg (has no administration in time range)    ED Course/ Medical Decision Making/ A&P                           Medical Decision Making  Paresthesias without objective sensory loss.  Cause is unclear.  No loss of deep tendon reflexes to suggest Guillain-Barr syndrome.  No focal findings to suggest radiculopathy or transverse myelitis or cauda equina.  No indication for imaging today.  I will empirically try a short course of prednisone and I have referred her to neurology for further  outpatient work-up.  Return if symptoms are worsening.  Final Clinical Impression(s) / ED Diagnoses Final diagnoses:  Paresthesia of both legs  Paresthesia of right arm    Rx / DC Orders ED Discharge Orders          Ordered    predniSONE (DELTASONE) 50 MG tablet  Daily        05/18/22 4403              Delora Fuel, MD 47/42/[birth year] [phone]

## 2022-05-21 DIAGNOSIS — M7918 Myalgia, other site: Secondary | ICD-10-CM | POA: Diagnosis not present

## 2022-05-24 DIAGNOSIS — Z7409 Other reduced mobility: Secondary | ICD-10-CM | POA: Diagnosis not present

## 2022-05-24 DIAGNOSIS — M5441 Lumbago with sciatica, right side: Secondary | ICD-10-CM | POA: Diagnosis not present

## 2022-05-24 DIAGNOSIS — S39012A Strain of muscle, fascia and tendon of lower back, initial encounter: Secondary | ICD-10-CM | POA: Diagnosis not present

## 2022-05-24 DIAGNOSIS — M542 Cervicalgia: Secondary | ICD-10-CM | POA: Diagnosis not present

## 2022-05-31 DIAGNOSIS — Z7409 Other reduced mobility: Secondary | ICD-10-CM | POA: Diagnosis not present

## 2022-05-31 DIAGNOSIS — M542 Cervicalgia: Secondary | ICD-10-CM | POA: Diagnosis not present

## 2022-05-31 DIAGNOSIS — S39012A Strain of muscle, fascia and tendon of lower back, initial encounter: Secondary | ICD-10-CM | POA: Diagnosis not present

## 2022-05-31 DIAGNOSIS — M5441 Lumbago with sciatica, right side: Secondary | ICD-10-CM | POA: Diagnosis not present

## 2022-06-02 DIAGNOSIS — S39012A Strain of muscle, fascia and tendon of lower back, initial encounter: Secondary | ICD-10-CM | POA: Diagnosis not present

## 2022-06-02 DIAGNOSIS — M542 Cervicalgia: Secondary | ICD-10-CM | POA: Diagnosis not present

## 2022-06-02 DIAGNOSIS — Z7409 Other reduced mobility: Secondary | ICD-10-CM | POA: Diagnosis not present

## 2022-06-02 DIAGNOSIS — M5441 Lumbago with sciatica, right side: Secondary | ICD-10-CM | POA: Diagnosis not present

## 2022-06-08 DIAGNOSIS — B351 Tinea unguium: Secondary | ICD-10-CM | POA: Diagnosis not present

## 2022-06-08 DIAGNOSIS — M542 Cervicalgia: Secondary | ICD-10-CM | POA: Diagnosis not present

## 2022-06-08 DIAGNOSIS — M4826 Kissing spine, lumbar region: Secondary | ICD-10-CM | POA: Diagnosis not present

## 2022-06-08 DIAGNOSIS — M7918 Myalgia, other site: Secondary | ICD-10-CM | POA: Diagnosis not present

## 2022-06-09 DIAGNOSIS — Z7409 Other reduced mobility: Secondary | ICD-10-CM | POA: Diagnosis not present

## 2022-06-09 DIAGNOSIS — M5441 Lumbago with sciatica, right side: Secondary | ICD-10-CM | POA: Diagnosis not present

## 2022-06-09 DIAGNOSIS — M542 Cervicalgia: Secondary | ICD-10-CM | POA: Diagnosis not present

## 2022-06-09 DIAGNOSIS — S39012A Strain of muscle, fascia and tendon of lower back, initial encounter: Secondary | ICD-10-CM | POA: Diagnosis not present

## 2022-06-14 DIAGNOSIS — R102 Pelvic and perineal pain: Secondary | ICD-10-CM | POA: Diagnosis not present

## 2022-06-14 DIAGNOSIS — R350 Frequency of micturition: Secondary | ICD-10-CM | POA: Diagnosis not present

## 2022-06-14 DIAGNOSIS — F3162 Bipolar disorder, current episode mixed, moderate: Secondary | ICD-10-CM | POA: Diagnosis not present

## 2022-06-14 DIAGNOSIS — F411 Generalized anxiety disorder: Secondary | ICD-10-CM | POA: Diagnosis not present
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
## 2022-06-15 ENCOUNTER — Encounter: Payer: Self-pay | Admitting: Neurology

## 2022-06-15 ENCOUNTER — Ambulatory Visit (INDEPENDENT_AMBULATORY_CARE_PROVIDER_SITE_OTHER): Payer: BC Managed Care – PPO | Admitting: Neurology

## 2022-06-15 VITALS — BP 117/82 | HR 100 | Ht 64.0 in | Wt 212.6 lb

## 2022-06-15 DIAGNOSIS — M79601 Pain in right arm: Secondary | ICD-10-CM

## 2022-06-15 DIAGNOSIS — R202 Paresthesia of skin: Secondary | ICD-10-CM | POA: Diagnosis not present

## 2022-06-15 DIAGNOSIS — R200 Anesthesia of skin: Secondary | ICD-10-CM

## 2022-06-15 MED ORDER — WRIST SPLINT MISC: 0 refills | Status: DC

## 2022-06-15 MED ORDER — WRIST SPLINT/ELASTIC RIGHT MED MISC: 0 refills | Status: DC

## 2022-06-15 NOTE — Patient Instructions (Signed)
Will check nerve study of right arm.  Further recommendations pending results In meantime, buy a wrist splint and wear on your wrist at bedtime and as much during day as tolerated to see if symptoms improve.

## 2022-06-16 DIAGNOSIS — Z3009 Encounter for other general counseling and advice on contraception: Secondary | ICD-10-CM | POA: Diagnosis not present

## 2022-06-16 DIAGNOSIS — N939 Abnormal uterine and vaginal bleeding, unspecified: Secondary | ICD-10-CM | POA: Diagnosis not present

## 2022-06-17 ENCOUNTER — Encounter: Payer: Self-pay | Admitting: Neurology

## 2022-06-17 ENCOUNTER — Ambulatory Visit (INDEPENDENT_AMBULATORY_CARE_PROVIDER_SITE_OTHER): Payer: BC Managed Care – PPO | Admitting: Neurology

## 2022-06-17 DIAGNOSIS — Z3202 Encounter for pregnancy test, result negative: Secondary | ICD-10-CM | POA: Diagnosis not present

## 2022-06-17 DIAGNOSIS — M79601 Pain in right arm: Secondary | ICD-10-CM | POA: Diagnosis not present

## 2022-06-17 DIAGNOSIS — Z30432 Encounter for removal of intrauterine contraceptive device: Secondary | ICD-10-CM | POA: Diagnosis not present

## 2022-06-17 DIAGNOSIS — G5601 Carpal tunnel syndrome, right upper limb: Secondary | ICD-10-CM

## 2022-06-17 NOTE — Procedures (Signed)
  [facility]  [address], [address]  [place], [place] [address] Tel: [phone] Fax: [phone] Test Date:  06/17/2022  Patient: [name] DOB: 1981-05-04 Physician: Narda Amber, DO  Sex: Female Height: '5\' 4"'$  Ref Phys: Metta Clines, DO  ID#: [id number]   Technician:    History: This is a 41 year old female referred for right arm pain.  NCV & EMG Findings: Extensive electrodiagnostic testing of the right upper extremity shows: Right median sensory nerve shows prolonged distal peak latency (3.8 ms).  Right ulnar sensory response is within normal limits. Right median and ulnar motor responses are within normal limits. Needle electrode examination was prematurely terminated  due to pain.    Impression: This is an incomplete study as needle electrode testing was terminated due to pain and therefore unable to evaluate for cervical radiculopathy. Nerve conduction study is suggestive of a right median neuropathy at or distal to the wrist, consistent with a clinical diagnosis of carpal tunnel syndrome.  Overall, these findings are mild in degree electrically.   ___________________________ Narda Amber, DO    Nerve Conduction Studies Motor Nerve Results    Latency Amplitude F-Lat Segment Distance CV Comment  Site (ms) Norm (mV) Norm (ms)  (cm) (m/s) Norm   Right Median (APB) Motor  Wrist 3.7  < 3.9 11.7  > 6.0        Elbow 8.6 - 11.1 -  Elbow-Wrist 29 59  > 50   Right Ulnar (ADM) Motor  Wrist 2.3  < 3.1 13.4  > 7.0        Bel elbow 5.7 - 12.7 -  Bel elbow-Wrist 21 62  > 50   Ab elbow 7.4 - 12.2 -  Ab elbow-Bel elbow 10 59 -    Sensory Sites    Neg Peak Lat Amplitude (O-P) Segment Distance Velocity Comment  Site (ms) Norm (V) Norm  (cm) (ms)   Right Median Sensory  Wrist-Dig II *3.8  < 3.4 29  > 20 Wrist-Dig II 13    Right Ulnar Sensory  Wrist-Dig V 3.0  < 3.1 39  > 12 Wrist-Dig V 11     Electromyography   Side Muscle Ins.Act Fibs Fasc Recrt Amp Dur  Poly Activation Comment  Right FDI Nml Nml Nml Nml Nml Nml Nml Nml N/A      Waveforms:  Motor      Sensory

## 2022-06-18 ENCOUNTER — Other Ambulatory Visit: Payer: Self-pay

## 2022-06-18 MED ORDER — WRIST SPLINT/ELASTIC RIGHT MED MISC: 0 refills | Status: DC

## 2022-06-22 ENCOUNTER — Telehealth: Payer: Self-pay

## 2022-06-22 DIAGNOSIS — G5601 Carpal tunnel syndrome, right upper limb: Secondary | ICD-10-CM

## 2022-06-22 NOTE — Progress Notes (Unsigned)
HPI: [name] is a 41 y.o. female who presents to the [facility] for [place] administration.  Patient Active Problem List   Diagnosis Date Noted   Insomnia 10/15/2021   Not well controlled moderate persistent asthma 09/28/2021   Seasonal and perennial allergic rhinoconjunctivitis 09/28/2021   Other atopic dermatitis 07/20/2021   Hyperglycemia 08/28/2019   Obesity (BMI 35.0-39.9 without comorbidity) 07/26/2019   Costochondritis, acute 01/02/2018   Bipolar mixed affective disorder, moderate ([place]) 03/20/2017   Chronic tension headaches 01/23/2017   Hyperlipidemia 11/17/2015   Frequent sinus infections 06/30/2015   Hernia, abdominal 08/05/2014   LGSIL of cervix of undetermined significance 10/24/2013   Superficial Liver masses, right lobe; suspected dermoid recurrence.   05/07/2013   Ovarian cystic mass 05/07/2013   s/p Serosal repair of colon 12/04/2012 12/15/2012   Herpes genitalis in women 12/07/2012   Anxiety state, unspecified 12/07/2012   GERD (gastroesophageal reflux disease) 12/07/2012   Generalized anxiety disorder 12/07/2012   Pelvic mass in female s/p Robotic-assisted right salpingo-oophorectomy 12/04/2012   HEMORRHOIDS NOS, W/O COMPLICATIONS 45/62/5638   Depression 11/25/2006   Other allergic rhinitis 11/25/2006   ASTHMA, COUGH VARIANT 11/15/2006   Human immunodeficiency virus (HIV) disease ([place]) 11/10/2006   Urticaria 11/10/2006    Patient's Medications  New Prescriptions   No medications on file  Previous Medications   ALBUTEROL (PROVENTIL) (2.5 MG/3ML) 0.083% NEBULIZER SOLUTION    Take 3 mLs (2.5 mg total) by nebulization every 4 (four) hours as needed for wheezing or shortness of breath (coughing fits).   ALBUTEROL (VENTOLIN HFA) 108 (90 BASE) MCG/ACT INHALER    Inhale 2 puffs into the lungs every 4 (four) hours as needed for wheezing or shortness of breath (coughing fits).   AZELASTINE (OPTIVAR) 0.05 % OPHTHALMIC SOLUTION    INSTILL 1 DROP IN  BOTH EYES TWICE DAILY AS NEEDED FOR ITCHY OR WATERY EYES   CABOTEGRAVIR & RILPIVIRINE ER (CABENUVA) 600 & 900 MG/3ML INJECTION    Inject 1 kit into the muscle every 2 (two) months.   CETIRIZINE (ZYRTEC) 10 MG TABLET    Take 2 tablets (20 mg total) by mouth at bedtime.   DENTA 5000 PLUS 1.1 % CREA DENTAL CREAM    USE SMALL AMOUNT ON TEETH AND GUMS EVERY EVENING SPIT OUT EXCESS AND DO NOT RINSE   ELASTIC BANDAGES & SUPPORTS (WRIST SPLINT) MISC    Wear splint as much as possible, Or nightly   ELASTIC BANDAGES & SUPPORTS (WRIST SPLINT/ELASTIC RIGHT MED) MISC    Wear Splint nightly.   FLUOCINOLONE ACETONIDE BODY 0.01 % OIL    Apply to scalp once daily as needed for itching/rash.   FLUTICASONE (FLONASE) 50 MCG/ACT NASAL SPRAY    Place 1 spray into both nostrils daily.   FLUTICASONE-SALMETEROL,SENSOR, (AIRDUO DIGIHALER) 232-14 MCG/ACT AEPB    Inhale 1 puff into the lungs in the morning and at bedtime. Rinse mouth after each use.   HYDROCORTISONE 2.5 % CREAM    Place rectally.   KETOCONAZOLE (NIZORAL) 2 % CREAM       LEVONORGESTREL (KYLEENA IU)    by Intrauterine route.   METHOCARBAMOL (ROBAXIN) 500 MG TABLET    Take 1 tablet (500 mg total) by mouth 2 (two) times daily.   MONTELUKAST (SINGULAIR) 10 MG TABLET    TAKE 1 TABLET BY MOUTH AT BEDTIME   OLOPATADINE-MOMETASONE (RYALTRIS) 665-25 MCG/ACT SUSP    Place 1-2 sprays into the nose in the morning and at bedtime.   OMEPRAZOLE (PRILOSEC)  40 MG CAPSULE    Take 1 capsule (40 mg total) by mouth daily. Nothing to eat or drink for 20-30 minutes.   OXYMETAZOLINE (AFRIN) 0.05 % NASAL SPRAY    Place 1 spray into both nostrils 2 (two) times daily. For <7 days at a time.   PHENTERMINE-TOPIRAMATE (QSYMIA) 3.75-23 MG CP24    Take 1 tablet by mouth daily.   PREGABALIN (LYRICA) 25 MG CAPSULE    Take by mouth.   QVAR REDIHALER 40 MCG/ACT INHALER    INHALE 2 PUFFS INTO THE LUNGS TWICE DAILY   TRETINOIN (RETIN-A) 0.025 % CREAM       TRIAMCINOLONE CREAM (KENALOG) 0.1 %     Apply 1 application topically 2 (two) times daily.   VALACYCLOVIR (VALTREX) 1000 MG TABLET    TAKE 1 TABLET(1000 MG) BY MOUTH DAILY  Modified Medications   No medications on file  Discontinued Medications   No medications on file    Allergies: Allergies  Allergen Reactions   Latuda [Lurasidone Hcl] Other (See Comments)    Occular dystonia   Seroquel [Quetiapine Fumarate] Swelling    Tongue swells    Benzoin Other (See Comments)    blisters   Latex Rash and Other (See Comments)   Sulfamethoxazole-Trimethoprim Itching and Nausea And Vomiting    Past Medical History: Past Medical History:  Diagnosis Date   Allergy    Anemia    Anxiety    Anxiety state, unspecified 12/07/2012   Arthritis    Asthma    Bipolar disorder, unspecified ([place]) 12/07/2012   Depression    Diabetes mellitus without complication (HCC)    no meds currently- controlled with diet   GERD (gastroesophageal reflux disease)    GERD (gastroesophageal reflux disease) 12/07/2012   Headache(784.0)    migraines   Herpes genitalis in women 12/07/2012   HIV infection ([place])    Human immunodeficiency virus (HIV) disease ([place]) 12/07/2012   Neuromuscular disorder ([place])    carpal tunnel in both arms   SHINGLES 01/31/2009   Qualifier: Diagnosis of  By: Tomma Lightning MD, Claiborne Billings     Type II or unspecified type diabetes mellitus without mention of complication, not stated as uncontrolled 12/07/2012   Unspecified asthma(493.90) 12/07/2012    Social History: Social History   Socioeconomic History   Marital status: Single    Spouse name: Not on file   Number of children: 1   Years of education: 12   Highest education level: Some college, no degree  Occupational History   Occupation: Nutrition Social research officer, government: [employer]  Tobacco Use   Smoking status: Never   Smokeless tobacco: Never  Vaping Use   Vaping Use: Never used  Substance and Sexual Activity   Alcohol use: Yes    Alcohol/week: 0.0 standard drinks  of alcohol    Comment: OCC.about once a month    Drug use: No   Sexual activity: Never    Partners: Male    Birth control/protection: Implant  Other Topics Concern   Not on file  Social History Narrative   Patient lives alone.   Patient works at home.   Patient has hs education.   Patient has 1 child.   Patient drinks multiple energy drinks and coffees throughout day, daily.      Patient is right-handed. She lives with her daughter in a one level home. She drinks one cup of coffee a day. She works for [employer] in [place] and is  very active at work.   Social Determinants of Health   Financial Resource Strain: Not on file  Food Insecurity: Not on file  Transportation Needs: Not on file  Physical Activity: Not on file  Stress: Not on file  Social Connections: Not on file    Labs: Lab Results  Component Value Date   HIV1RNAQUANT Not Detected 05/04/2022   HIV1RNAQUANT <20 (H) 02/23/2022   HIV1RNAQUANT NOT DETECTED 01/01/2022   CD4TABS 1,061 02/23/2022   CD4TABS 1,434 08/20/2021   CD4TABS 1,521 05/15/2020    RPR and STI Lab Results  Component Value Date   LABRPR NON-REACTIVE 02/23/2022   LABRPR NON-REACTIVE 08/20/2021   LABRPR NON-REACTIVE 02/06/2021   LABRPR NON-REACTIVE 05/15/2020   LABRPR NON-REACTIVE 08/28/2019    STI Results GC GC CT CT  08/21/2021 10:57 AM Negative   Negative    02/06/2021 10:33 AM Negative   Negative    05/16/2020 10:07 AM Negative   Negative    08/28/2019  4:04 PM Negative   Negative    09/21/2018 12:00 AM Negative   Negative    06/27/2018 12:00 AM Negative   Negative    01/02/2018 12:00 AM Negative   Negative    10/27/2017 12:00 AM Negative   Negative    08/03/2017 12:00 AM Negative   Negative    04/06/2017 12:00 AM Negative   Negative    08/13/2016 12:00 AM Negative   Negative    02/06/2016 12:00 AM Negative   Negative    11/05/2015 12:00 AM Negative   Negative    06/06/2015 12:00 AM Negative   Negative     12/26/2014 12:00 AM Negative   Negative    02/12/2013 11:13 AM  NEGATIVE   NEGATIVE   06/21/2010 12:52 AM   NEGATIVE (NOTE)  Testing performed using the BD ProbeTec Qx Chlamydia trachomatis and Neisseria gonorrhea amplified DNA assay.  Performed at:  [facility] [address] [place], [place] [address]               [id number]    07/12/2007  8:56 PM   NEGATIVE (NOTE)  Testing performed using the BD Probetec ET Chlamydia trachomatis and Neisseria gonorrhea amplified DNA assay.      Hepatitis B Lab Results  Component Value Date   HEPBSAB REACTIVE (A) 08/03/2017   HEPBSAG NEG 11/10/2006   HEPBCAB NEG 11/10/2006   Hepatitis C Lab Results  Component Value Date   HEPCAB NON-REACTIVE 02/23/2022   Hepatitis A Lab Results  Component Value Date   HAV REACTIVE (A) 01/01/2022   Lipids: Lab Results  Component Value Date   CHOL 219 (H) 02/06/2021   TRIG 92 02/06/2021   HDL 46 (L) 02/06/2021   CHOLHDL 4.8 02/06/2021   VLDL 25 04/06/2017   LDLCALC 153 (H) 02/06/2021    TARGET DATE: 6th of each month   Assessment: [name] presents today for her maintenance Cabenuva injections. Past injections were tolerated well without issues.  Administered cabotegravir 658m/3mL in left upper outer quadrant of the gluteal muscle. Administered rilpivirine 900 mg/360min the right upper outer quadrant of the gluteal muscle. No issues with injections. She will follow up in 2 months for next set of injections.  Patient  offered COVID and influenza vaccines during today's visit and she stated she is a little concerned about getting them both at the same time. She says she would like to receive the flu by itself for the time being. Would like to get the COVID vaccine when she comes into the clinic for counseling on 06/29/22.   Patient reports she is getting IUD placement tomorrow.   Moving to [place]- conform she found a  provider in [place] otherwise she needs to establish with a provider here  - unable to establish with specific provider with [facility] start doing appointments with new provider until January  - Would be willing to start seeing Dr. Juleen China  Plan: [place] Reap injections administered - Next injections scheduled for 08/23/22 with Dr. Juleen China and 10/22/22 with Estill Bamberg  - Call with any issues or questions  Adria Dill, PharmD PGY-2 Infectious Diseases Resident  06/22/2022 6:33 PM

## 2022-06-22 NOTE — Telephone Encounter (Signed)
Referral added. Patient advised.

## 2022-06-22 NOTE — Telephone Encounter (Signed)
LMOVm for patient to give the office a call back.

## 2022-06-22 NOTE — Telephone Encounter (Signed)
-----   Message from Pieter Partridge, DO sent at 06/18/2022  6:37 AM EDT ----- Nerve study demonstrates evidence of mild carpal tunnel syndrome.  It is mild.  All I can recommend is wearing a wrist splint on the right wrist.  Otherwise, we can refer her to a hand specialist to see if she qualifies for injections into the carpal tunnel.

## 2022-06-23 ENCOUNTER — Ambulatory Visit (INDEPENDENT_AMBULATORY_CARE_PROVIDER_SITE_OTHER): Payer: BC Managed Care – PPO | Admitting: Pharmacist

## 2022-06-23 ENCOUNTER — Other Ambulatory Visit: Payer: Self-pay

## 2022-06-23 DIAGNOSIS — B20 Human immunodeficiency virus [HIV] disease: Secondary | ICD-10-CM | POA: Diagnosis not present

## 2022-06-23 MED ORDER — CABOTEGRAVIR & RILPIVIRINE ER 600 & 900 MG/3ML IM SUER
1.0000 | Freq: Once | INTRAMUSCULAR | Status: AC
  Administered 2022-06-23: 1 via INTRAMUSCULAR

## 2022-06-24 DIAGNOSIS — Z30430 Encounter for insertion of intrauterine contraceptive device: Secondary | ICD-10-CM | POA: Diagnosis not present

## 2022-06-24 DIAGNOSIS — Z30433 Encounter for removal and reinsertion of intrauterine contraceptive device: Secondary | ICD-10-CM | POA: Diagnosis not present

## 2022-06-25 DIAGNOSIS — M5412 Radiculopathy, cervical region: Secondary | ICD-10-CM | POA: Diagnosis not present

## 2022-06-25 DIAGNOSIS — F4310 Post-traumatic stress disorder, unspecified: Secondary | ICD-10-CM | POA: Diagnosis not present

## 2022-06-25 DIAGNOSIS — F331 Major depressive disorder, recurrent, moderate: Secondary | ICD-10-CM | POA: Diagnosis not present

## 2022-06-25 DIAGNOSIS — F411 Generalized anxiety disorder: Secondary | ICD-10-CM | POA: Diagnosis not present

## 2022-06-25 DIAGNOSIS — F909 Attention-deficit hyperactivity disorder, unspecified type: Secondary | ICD-10-CM | POA: Diagnosis not present

## 2022-06-29 ENCOUNTER — Ambulatory Visit: Payer: BC Managed Care – PPO

## 2022-06-29 ENCOUNTER — Other Ambulatory Visit: Payer: Self-pay

## 2022-06-29 ENCOUNTER — Ambulatory Visit (INDEPENDENT_AMBULATORY_CARE_PROVIDER_SITE_OTHER): Payer: BC Managed Care – PPO

## 2022-06-29 DIAGNOSIS — M79641 Pain in right hand: Secondary | ICD-10-CM | POA: Diagnosis not present

## 2022-06-29 DIAGNOSIS — G5601 Carpal tunnel syndrome, right upper limb: Secondary | ICD-10-CM | POA: Diagnosis not present

## 2022-06-29 DIAGNOSIS — Z23 Encounter for immunization: Secondary | ICD-10-CM | POA: Diagnosis not present

## 2022-07-13 ENCOUNTER — Ambulatory Visit: Payer: BC Managed Care – PPO

## 2022-07-13 NOTE — Progress Notes (Unsigned)
Therapist met with client as scheduled and discussed their progress. Therapist used active listening skills to provide client with opportunity to disclose previous challenges and successes since last session. Specific problem-solving skills were processed with client, including breaking down problems, brainstorming, evaluating, and choosing options. At times implementing a plan and evaluating and reevaluating results. Therapist assessed for SI/HI during session and will follow-up with client during the next session.  Client attended session with therapist as scheduled and presented alert; orient X4. On start of session, client affect appeared euthymic; affect was congruent with client report. Appearance was relaxed/casual, and attitude was appropriate. Client was receptive to rapport building by sharing information about him/herself, such as: expressing likes, dislikes, and strengths. Client was able to make connections between consequences, challenges and alternative thoughts of mental health recovery. Client progress toward therapeutic goal(s) are minimal at this time. Client denied SI/HI. 

## 2022-07-19 DIAGNOSIS — J019 Acute sinusitis, unspecified: Secondary | ICD-10-CM | POA: Diagnosis not present

## 2022-07-19 DIAGNOSIS — B370 Candidal stomatitis: Secondary | ICD-10-CM | POA: Diagnosis not present

## 2022-07-23 ENCOUNTER — Telehealth: Payer: Self-pay

## 2022-07-23 ENCOUNTER — Ambulatory Visit: Payer: BC Managed Care – PPO | Admitting: Internal Medicine

## 2022-07-23 NOTE — Telephone Encounter (Signed)
Called patient to reschedule appointment to next week. Advised she follow back up with PCP if she is not feeling better or visit Urgent Care.  Patient would like to know if something else could be called into pharmacy for her to start this weekend. Will forward message to provider. [id number] - [place], [place] - [address] AT [place], [address]

## 2022-07-23 NOTE — Telephone Encounter (Signed)
Patient called and stated she has had white patches on her tongue for several weeks, which had recently worsened. States she saw her PCP on Monday, who prescribed nystatin. Patient states that nystatin has not helped to resolve her symptoms, and that her PCP recommended that she call our office for an appointment if the nystatin did not help within a few days.  Patient denies any additional symptoms.  Scheduled with Dr. Juleen China this morning.  Binnie Kand, RN

## 2022-07-26 DIAGNOSIS — M4826 Kissing spine, lumbar region: Secondary | ICD-10-CM | POA: Diagnosis not present

## 2022-07-27 ENCOUNTER — Other Ambulatory Visit: Payer: Self-pay

## 2022-07-27 ENCOUNTER — Ambulatory Visit: Payer: BC Managed Care – PPO

## 2022-07-27 ENCOUNTER — Encounter: Payer: Self-pay | Admitting: Internal Medicine

## 2022-07-27 ENCOUNTER — Ambulatory Visit (INDEPENDENT_AMBULATORY_CARE_PROVIDER_SITE_OTHER): Payer: BC Managed Care – PPO | Admitting: Internal Medicine

## 2022-07-27 VITALS — BP 137/83 | HR 71 | Temp 98.3°F | Ht 64.0 in | Wt 207.0 lb

## 2022-07-27 DIAGNOSIS — M5412 Radiculopathy, cervical region: Secondary | ICD-10-CM | POA: Diagnosis not present

## 2022-07-27 DIAGNOSIS — B20 Human immunodeficiency virus [HIV] disease: Secondary | ICD-10-CM | POA: Diagnosis not present

## 2022-07-27 DIAGNOSIS — B009 Herpesviral infection, unspecified: Secondary | ICD-10-CM

## 2022-07-27 DIAGNOSIS — B370 Candidal stomatitis: Secondary | ICD-10-CM | POA: Diagnosis not present

## 2022-07-27 DIAGNOSIS — A6009 Herpesviral infection of other urogenital tract: Secondary | ICD-10-CM | POA: Diagnosis not present

## 2022-07-27 MED ORDER — FLUCONAZOLE 200 MG PO TABS: 200.0000 mg | ORAL_TABLET | Freq: Every day | ORAL | 0 refills | Status: AC

## 2022-07-27 MED ORDER — VALACYCLOVIR HCL 1 G PO TABS: ORAL_TABLET | ORAL | 1 refills | Status: DC

## 2022-07-27 NOTE — Assessment & Plan Note (Signed)
She has good long term control of her HIV and currently on Cabenuva injections every 2 months. She will return next month for scheduled injection and will check labs at that time.

## 2022-07-27 NOTE — Assessment & Plan Note (Signed)
I refilled her Valtrex that she uses for episodic treatment.

## 2022-07-27 NOTE — Progress Notes (Signed)
[facility]   CHIEF COMPLAINT    HIV follow up.    SUBJECTIVE:    [name] is a 41 y.o. female with PMHx as below who presents to the clinic for HIV follow up.   Patient has previously followed with Dr Johnnye Sima for HIV follow up.  She is currently on [place] since September 2022.  Her prior regimens per Dr Algis Downs last note has included Biktarvy and Atripla.  She is tolerating injections without issues and last received on 06/23/22.  She is scheduled for her next injection appointment with me on 08/23/22.    She called the office on 07/23/22 for white patches on her tongue for several weeks.  Her PCP prescribed Nystatin on 07/19/22 which had helped initially but then seems to have worsened again.  She is done with the Nystatin at this point.  Her PCP told her to call our office for an appointment.  She was initially scheduled for an appointment 07/23/22 but was then rescheduled for today.  She underwent lumbar bursal injection yesterday.  She is prescribed steroid inhalers for her asthma history.  She reports not using these on a daily basis and has not used them in about a month.  She reports that she does rinse her mouth after every use when she was taking.  She sees ENT in January for her sinus issues.  Please see A&P for the details of today's visit and status of the patient's medical problems.   Patient's Medications  New Prescriptions   FLUCONAZOLE (DIFLUCAN) 200 MG TABLET    Take 1 tablet (200 mg total) by mouth daily for 14 days.  Previous Medications   ALBUTEROL (PROVENTIL) (2.5 MG/3ML) 0.083% NEBULIZER SOLUTION    Take 3 mLs (2.5 mg total) by nebulization every 4 (four) hours as needed for wheezing or shortness of breath (coughing fits).   ALBUTEROL (VENTOLIN HFA) 108 (90 BASE) MCG/ACT INHALER    Inhale 2 puffs into the lungs every 4 (four) hours as needed for wheezing or shortness of breath (coughing fits).   AZELASTINE (OPTIVAR) 0.05 %  OPHTHALMIC SOLUTION    INSTILL 1 DROP IN BOTH EYES TWICE DAILY AS NEEDED FOR ITCHY OR WATERY EYES   CABOTEGRAVIR & RILPIVIRINE ER (CABENUVA) 600 & 900 MG/3ML INJECTION    Inject 1 kit into the muscle every 2 (two) months.   CETIRIZINE (ZYRTEC) 10 MG TABLET    Take 2 tablets (20 mg total) by mouth at bedtime.   DENTA 5000 PLUS 1.1 % CREA DENTAL CREAM    USE SMALL AMOUNT ON TEETH AND GUMS EVERY EVENING SPIT OUT EXCESS AND DO NOT RINSE   ELASTIC BANDAGES & SUPPORTS (WRIST SPLINT) MISC    Wear splint as much as possible, Or nightly   ELASTIC BANDAGES & SUPPORTS (WRIST SPLINT/ELASTIC RIGHT MED) MISC    Wear Splint nightly.   FLUOCINOLONE ACETONIDE BODY 0.01 % OIL    Apply to scalp once daily as needed for itching/rash.   FLUTICASONE (FLONASE) 50 MCG/ACT NASAL SPRAY    Place 1 spray into both nostrils daily.   FLUTICASONE-SALMETEROL,SENSOR, (AIRDUO DIGIHALER) 232-14 MCG/ACT AEPB    Inhale 1 puff into the lungs in the morning and at bedtime. Rinse mouth after each use.   HYDROCORTISONE 2.5 % CREAM    Place rectally.   KETOCONAZOLE (NIZORAL) 2 % CREAM       LEVONORGESTREL (KYLEENA IU)    by Intrauterine route.  METHOCARBAMOL (ROBAXIN) 500 MG TABLET    Take 1 tablet (500 mg total) by mouth 2 (two) times daily.   MONTELUKAST (SINGULAIR) 10 MG TABLET    TAKE 1 TABLET BY MOUTH AT BEDTIME   OLOPATADINE-MOMETASONE (RYALTRIS) 665-25 MCG/ACT SUSP    Place 1-2 sprays into the nose in the morning and at bedtime.   OMEPRAZOLE (PRILOSEC) 40 MG CAPSULE    Take 1 capsule (40 mg total) by mouth daily. Nothing to eat or drink for 20-30 minutes.   OXYMETAZOLINE (AFRIN) 0.05 % NASAL SPRAY    Place 1 spray into both nostrils 2 (two) times daily. For <7 days at a time.   PHENTERMINE-TOPIRAMATE (QSYMIA) 3.75-23 MG CP24    Take 1 tablet by mouth daily.   PREGABALIN (LYRICA) 25 MG CAPSULE    Take by mouth.   QVAR REDIHALER 40 MCG/ACT INHALER    INHALE 2 PUFFS INTO THE LUNGS TWICE DAILY   TRETINOIN (RETIN-A) 0.025 % CREAM        TRIAMCINOLONE CREAM (KENALOG) 0.1 %    Apply 1 application topically 2 (two) times daily.  Modified Medications   Modified Medication Previous Medication   VALACYCLOVIR (VALTREX) 1000 MG TABLET valACYclovir (VALTREX) 1000 MG tablet      TAKE 1 TABLET(1000 MG) BY MOUTH DAILY    TAKE 1 TABLET(1000 MG) BY MOUTH DAILY  Discontinued Medications   No medications on file      Past Medical History:  Diagnosis Date   Allergy    Anemia    Anxiety    Anxiety state, unspecified 12/07/2012   Arthritis    Asthma    Bipolar disorder, unspecified ([place]) 12/07/2012   Depression    Diabetes mellitus without complication (HCC)    no meds currently- controlled with diet   GERD (gastroesophageal reflux disease)    GERD (gastroesophageal reflux disease) 12/07/2012   Headache(784.0)    migraines   Herpes genitalis in women 12/07/2012   HIV infection ([place])    Human immunodeficiency virus (HIV) disease ([place]) 12/07/2012   Neuromuscular disorder ([place])    carpal tunnel in both arms   SHINGLES 01/31/2009   Qualifier: Diagnosis of  By: Tomma Lightning MD, Claiborne Billings     Type II or unspecified type diabetes mellitus without mention of complication, not stated as uncontrolled 12/07/2012   Unspecified asthma(493.90) 12/07/2012    Social History   Tobacco Use   Smoking status: Never   Smokeless tobacco: Never  Vaping Use   Vaping Use: Never used  Substance Use Topics   Alcohol use: Yes    Alcohol/week: 0.0 standard drinks of alcohol    Comment: OCC.about once a month    Drug use: No    Family History  Problem Relation Age of Onset   Arthritis/Rheumatoid Mother    Hypertension Mother    Arthritis/Rheumatoid Father    Cancer Maternal Grandmother        breasts   Cancer Paternal Grandmother        breast and kidney   Cancer Maternal Aunt        brand and lung    Allergies  Allergen Reactions   Latuda [Lurasidone Hcl] Other (See Comments)    Occular dystonia   Seroquel [Quetiapine Fumarate] Swelling     Tongue swells    Benzoin Other (See Comments)    blisters   Latex Rash and Other (See Comments)   Sulfamethoxazole-Trimethoprim Itching and Nausea And Vomiting    Review of Systems  All other systems reviewed  and are negative.    OBJECTIVE:    Vitals:   07/27/22 1054 07/27/22 1059  BP:  137/83  Pulse:  71  Temp:  98.3 F (36.8 C)  TempSrc:  Temporal  SpO2:  99%  Weight: 207 lb (93.9 kg)   Height: _0  (1.626 m)      Body mass index is 35.53 kg/m.  Physical Exam Constitutional:      Appearance: Normal appearance.  HENT:     Head: Normocephalic and atraumatic.     Mouth/Throat:     Comments: Tongue has white patches and fissure which could be consistent with thrush. Skin:    General: Skin is warm and dry.  Neurological:     General: No focal deficit present.     Mental Status: She is alert and oriented to person, place, and time.  Psychiatric:        Mood and Affect: Mood normal.        Behavior: Behavior normal.     Labs and Microbiology:    Latest Ref Rng & Units 08/20/2021    4:05 PM 07/17/2021    3:31 PM 02/06/2021   10:13 AM  CMP  Glucose 65 - 99 mg/dL 83  113  122   BUN 7 - 25 mg/dL _1 Creatinine 0.50 - 0.99 mg/dL 1.07  0.97  1.09   Sodium 135 - 146 mmol/L 140  140  136   Potassium 3.5 - 5.3 mmol/L 4.3  4.2  4.6   Chloride 98 - 110 mmol/L 103  103  103   CO2 20 - 32 mmol/L _2 Calcium 8.6 - 10.2 mg/dL 9.7  9.5  9.3   Total Protein 6.1 - 8.1 g/dL 7.0  6.4  6.7   Total Bilirubin 0.2 - 1.2 mg/dL 0.5  0.4  0.5   Alkaline Phos 44 - 121 IU/L  82    AST 10 - 30 U/L _3 ALT 6 - 29 U/L _4 Latest Ref Rng & Units 08/20/2021    4:05 PM 07/17/2021    3:31 PM 02/06/2021   10:13 AM  CBC  WBC 3.8 - 10.8 Thousand/uL 9.6  8.8  7.1   Hemoglobin 11.7 - 15.5 g/dL 14.3  13.6  14.6   Hematocrit 35.0 - 45.0 % 41.8  39.2  41.4   Platelets 140 - 400 Thousand/uL 339  340  332      Lab Results  Component Value Date    HIV1RNAQUANT Not Detected 05/04/2022   HIV1RNAQUANT <20 (H) 02/23/2022   HIV1RNAQUANT NOT DETECTED 01/01/2022   CD4TABS 1,061 02/23/2022   CD4TABS 1,434 08/20/2021   CD4TABS 1,521 05/15/2020    RPR and STI: Lab Results  Component Value Date   LABRPR NON-REACTIVE 02/23/2022   LABRPR NON-REACTIVE 08/20/2021   LABRPR NON-REACTIVE 02/06/2021   LABRPR NON-REACTIVE 05/15/2020   LABRPR NON-REACTIVE 08/28/2019    STI Results GC GC CT CT  08/21/2021 10:57 AM Negative   Negative    02/06/2021 10:33 AM Negative   Negative    05/16/2020 10:07 AM Negative   Negative    08/28/2019  4:04 PM Negative   Negative    09/21/2018 12:00 AM Negative   Negative    06/27/2018 12:00 AM Negative   Negative    01/02/2018 12:00 AM Negative   Negative    10/27/2017  12:00 AM Negative   Negative    08/03/2017 12:00 AM Negative   Negative    04/06/2017 12:00 AM Negative   Negative    08/13/2016 12:00 AM Negative   Negative    02/06/2016 12:00 AM Negative   Negative    11/05/2015 12:00 AM Negative   Negative    06/06/2015 12:00 AM Negative   Negative    12/26/2014 12:00 AM Negative   Negative    02/12/2013 11:13 AM  NEGATIVE   NEGATIVE   06/21/2010 12:52 AM   NEGATIVE (NOTE)  Testing performed using the BD ProbeTec Qx Chlamydia trachomatis and Neisseria gonorrhea amplified DNA assay.  Performed at:  [facility] [address] [place], [place] [address]               [id number]    07/12/2007  8:56 PM   NEGATIVE (NOTE)  Testing performed using the BD Probetec ET Chlamydia trachomatis and Neisseria gonorrhea amplified DNA assay.      Hepatitis B: Lab Results  Component Value Date   HEPBSAB REACTIVE (A) 08/03/2017   HEPBSAG NEG 11/10/2006   HEPBCAB NEG 11/10/2006   Hepatitis C: Lab Results  Component Value Date   HEPCAB NON-REACTIVE 02/23/2022   Hepatitis A: Lab Results  Component Value Date   HAV REACTIVE (A)  01/01/2022   Lipids: Lab Results  Component Value Date   CHOL 219 (H) 02/06/2021   TRIG 92 02/06/2021   HDL 46 (L) 02/06/2021   CHOLHDL 4.8 02/06/2021   VLDL 25 04/06/2017   LDLCALC 153 (H) 02/06/2021    I   ASSESSMENT & PLAN:    Human immunodeficiency virus (HIV) disease ([place]) She has good long term control of her HIV and currently on Cabenuva injections every 2 months. She will return next month for scheduled injection and will check labs at that time.   Thrush Suspect possibly due to use of her steroid inhaler although she reports not using recently.  She was prescribed Nystatin last week with overall inadequate response.  Today, reports has continued white plaques on her tongue.  Will treat with Fluconazole 223m daily x14 days to see if this leads to resolution.  She reports an episode of thrush several years ago treated successfully with Fluconazole so she feels comfortable with this plan.  If this does not lead to resolution, would consider ENT evaluation for alternate etiology vs attempt higher dose/different antifungal.   Herpes genitalis in women I refilled her Valtrex that she uses for episodic treatment.     ARaynelle Highlandfor Infectious Disease [facility] 07/27/2022, 11:24 AM

## 2022-07-27 NOTE — Assessment & Plan Note (Signed)
Suspect possibly due to use of her steroid inhaler although she reports not using recently.  She was prescribed Nystatin last week with overall inadequate response.  Today, reports has continued white plaques on her tongue.  Will treat with Fluconazole '200mg'$  daily x14 days to see if this leads to resolution.  She reports an episode of thrush several years ago treated successfully with Fluconazole so she feels comfortable with this plan.  If this does not lead to resolution, would consider ENT evaluation for alternate etiology vs attempt higher dose/different antifungal.

## 2022-08-04 DIAGNOSIS — M542 Cervicalgia: Secondary | ICD-10-CM | POA: Diagnosis not present

## 2022-08-04 DIAGNOSIS — M546 Pain in thoracic spine: Secondary | ICD-10-CM | POA: Diagnosis not present

## 2022-08-04 DIAGNOSIS — M533 Sacrococcygeal disorders, not elsewhere classified: Secondary | ICD-10-CM | POA: Diagnosis not present

## 2022-08-04 DIAGNOSIS — M79641 Pain in right hand: Secondary | ICD-10-CM | POA: Diagnosis not present

## 2022-08-04 DIAGNOSIS — M25531 Pain in right wrist: Secondary | ICD-10-CM | POA: Diagnosis not present

## 2022-08-05 DIAGNOSIS — M79641 Pain in right hand: Secondary | ICD-10-CM | POA: Diagnosis not present

## 2022-08-05 DIAGNOSIS — M79642 Pain in left hand: Secondary | ICD-10-CM | POA: Diagnosis not present

## 2022-08-18 DIAGNOSIS — J302 Other seasonal allergic rhinitis: Secondary | ICD-10-CM | POA: Diagnosis not present

## 2022-08-18 DIAGNOSIS — J0181 Other acute recurrent sinusitis: Secondary | ICD-10-CM | POA: Diagnosis not present

## 2022-08-23 ENCOUNTER — Ambulatory Visit (INDEPENDENT_AMBULATORY_CARE_PROVIDER_SITE_OTHER): Payer: BC Managed Care – PPO | Admitting: Internal Medicine

## 2022-08-23 ENCOUNTER — Encounter: Payer: Self-pay | Admitting: Internal Medicine

## 2022-08-23 ENCOUNTER — Other Ambulatory Visit: Payer: Self-pay

## 2022-08-23 VITALS — BP 130/89 | HR 82 | Temp 98.3°F | Ht 64.0 in | Wt 212.0 lb

## 2022-08-23 DIAGNOSIS — B370 Candidal stomatitis: Secondary | ICD-10-CM

## 2022-08-23 DIAGNOSIS — B20 Human immunodeficiency virus [HIV] disease: Secondary | ICD-10-CM | POA: Diagnosis not present

## 2022-08-23 MED ORDER — CABOTEGRAVIR & RILPIVIRINE ER 600 & 900 MG/3ML IM SUER
1.0000 | Freq: Once | INTRAMUSCULAR | Status: AC
  Administered 2022-08-23: 1 via INTRAMUSCULAR

## 2022-08-23 NOTE — Assessment & Plan Note (Signed)
Will give Cabenuva today and check labs.  Follow up in 2 months with pharmacy, 4 months with myself.

## 2022-08-23 NOTE — Patient Instructions (Signed)
Thank you for coming to see me today. It was a pleasure seeing you.  To Do: Cabenuva and labs today  If you have any questions or concerns, please do not hesitate to call the office at [phone].  Take Care,   Jule Ser

## 2022-08-23 NOTE — Addendum Note (Signed)
Addended by: Adelfa Koh on: 08/23/2022 11:05 AM   Modules accepted: Orders

## 2022-08-23 NOTE — Assessment & Plan Note (Signed)
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  For now, will hold off on further antifungal therapy and observe.

## 2022-08-23 NOTE — Progress Notes (Signed)
[facility]   CHIEF COMPLAINT    HIV follow up.    SUBJECTIVE:    [name] is a 42 y.o. female with PMHx as below who presents to the clinic for HIV follow up.   Patient has previously followed with Dr Johnnye Sima for HIV follow up. She is currently on [place] since September 2022. Her prior regimens per Dr Algis Downs last note has included Biktarvy and Atripla. She is tolerating injections without issues and last received on 06/23/22. She is scheduled for her next injection appointment this morning.  Her tongue is about the same after course of fluconazole but maybe slightly better.  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  Please see A&P for the details of today's visit and status of the patient's medical problems.   Patient's Medications  New Prescriptions   No medications on file  Previous Medications   ALBUTEROL (PROVENTIL) (2.5 MG/3ML) 0.083% NEBULIZER SOLUTION    Take 3 mLs (2.5 mg total) by nebulization every 4 (four) hours as needed for wheezing or shortness of breath (coughing fits).   ALBUTEROL (VENTOLIN HFA) 108 (90 BASE) MCG/ACT INHALER    Inhale 2 puffs into the lungs every 4 (four) hours as needed for wheezing or shortness of breath (coughing fits).   AZELASTINE (OPTIVAR) 0.05 % OPHTHALMIC SOLUTION    INSTILL 1 DROP IN BOTH EYES TWICE DAILY AS NEEDED FOR ITCHY OR WATERY EYES   CABOTEGRAVIR & RILPIVIRINE ER (CABENUVA) 600 & 900 MG/3ML INJECTION    Inject 1 kit into the muscle every 2 (two) months.   CETIRIZINE (ZYRTEC) 10 MG TABLET    Take 2 tablets (20 mg total) by mouth at bedtime.   DENTA 5000 PLUS 1.1 % CREA DENTAL CREAM    USE SMALL AMOUNT ON TEETH AND GUMS EVERY EVENING SPIT OUT EXCESS AND DO NOT RINSE   ELASTIC BANDAGES & SUPPORTS (WRIST SPLINT) MISC    Wear splint as much as possible, Or nightly   ELASTIC BANDAGES & SUPPORTS (WRIST SPLINT/ELASTIC RIGHT MED) MISC    Wear Splint nightly.   FLUOCINOLONE  ACETONIDE BODY 0.01 % OIL    Apply to scalp once daily as needed for itching/rash.   FLUOXETINE (PROZAC) 20 MG CAPSULE    TAKE ONE CAPSULE BY MOUTH DAILY ALONG WITH 40 MG CAPSULE   FLUOXETINE (PROZAC) 40 MG CAPSULE    Take 40 mg by mouth daily.   FLUTICASONE (FLONASE) 50 MCG/ACT NASAL SPRAY    Place 1 spray into both nostrils daily.   FLUTICASONE-SALMETEROL,SENSOR, (AIRDUO DIGIHALER) 232-14 MCG/ACT AEPB    Inhale 1 puff into the lungs in the morning and at bedtime. Rinse mouth after each use.   HYDROCORTISONE 2.5 % CREAM    Place rectally.   KETOCONAZOLE (NIZORAL) 2 % CREAM       LEVONORGESTREL (KYLEENA IU)    by Intrauterine route.   METHOCARBAMOL (ROBAXIN) 500 MG TABLET    Take 1 tablet (500 mg total) by mouth 2 (two) times daily.   MONTELUKAST (SINGULAIR) 10 MG TABLET    TAKE 1 TABLET BY MOUTH AT BEDTIME   OLOPATADINE-MOMETASONE (RYALTRIS) 665-25 MCG/ACT SUSP    Place 1-2 sprays into the nose in the morning and at bedtime.   OMEPRAZOLE (PRILOSEC) 40 MG CAPSULE    Take 1 capsule (40 mg total) by mouth daily. Nothing to eat or drink for 20-30 minutes.   OXYMETAZOLINE (AFRIN) 0.05 %  NASAL SPRAY    Place 1 spray into both nostrils 2 (two) times daily. For <7 days at a time.   PHENTERMINE-TOPIRAMATE (QSYMIA) 3.75-23 MG CP24    Take 1 tablet by mouth daily.   PREGABALIN (LYRICA) 25 MG CAPSULE    Take by mouth.   QVAR REDIHALER 40 MCG/ACT INHALER    INHALE 2 PUFFS INTO THE LUNGS TWICE DAILY   TRETINOIN (RETIN-A) 0.025 % CREAM       TRIAMCINOLONE CREAM (KENALOG) 0.1 %    Apply 1 application topically 2 (two) times daily.   VALACYCLOVIR (VALTREX) 1000 MG TABLET    TAKE 1 TABLET(1000 MG) BY MOUTH DAILY   VRAYLAR 1.5 MG CAPSULE    Take 1.5 mg by mouth daily.  Modified Medications   No medications on file  Discontinued Medications   No medications on file      Past Medical History:  Diagnosis Date   Allergy    Anemia    Anxiety    Anxiety state, unspecified 12/07/2012   Arthritis    Asthma     Bipolar disorder, unspecified ([place]) 12/07/2012   Depression    Diabetes mellitus without complication (HCC)    no meds currently- controlled with diet   GERD (gastroesophageal reflux disease)    GERD (gastroesophageal reflux disease) 12/07/2012   Headache(784.0)    migraines   Herpes genitalis in women 12/07/2012   HIV infection ([place])    Human immunodeficiency virus (HIV) disease ([place]) 12/07/2012   Neuromuscular disorder ([place])    carpal tunnel in both arms   SHINGLES 01/31/2009   Qualifier: Diagnosis of  By: Tomma Lightning MD, Claiborne Billings     Type II or unspecified type diabetes mellitus without mention of complication, not stated as uncontrolled 12/07/2012   Unspecified asthma(493.90) 12/07/2012    Social History   Tobacco Use   Smoking status: Never   Smokeless tobacco: Never  Vaping Use   Vaping Use: Never used  Substance Use Topics   Alcohol use: Yes    Alcohol/week: 0.0 standard drinks of alcohol    Comment: OCC.about once a month    Drug use: No    Family History  Problem Relation Age of Onset   Arthritis/Rheumatoid Mother    Hypertension Mother    Arthritis/Rheumatoid Father    Cancer Maternal Grandmother        breasts   Cancer Paternal Grandmother        breast and kidney   Cancer Maternal Aunt        brand and lung    Allergies  Allergen Reactions   Latuda [Lurasidone Hcl] Other (See Comments)    Occular dystonia   Seroquel [Quetiapine Fumarate] Swelling    Tongue swells    Benzoin Other (See Comments)    blisters   Latex Rash and Other (See Comments)   Sulfamethoxazole-Trimethoprim Itching and Nausea And Vomiting    Review of Systems  All other systems reviewed and are negative.    OBJECTIVE:    Vitals:   08/23/22 0915  BP: 130/89  Pulse: 82  Temp: 98.3 F (36.8 C)  TempSrc: Temporal  SpO2: 98%  Weight: 212 lb (96.2 kg)  Height: '5\' 4"'$  (1.626 m)     Body mass index is 36.39 kg/m.  Physical Exam Constitutional:      Appearance: Normal appearance.   HENT:     Mouth/Throat:     Comments: Her tongue has patches with  circumferential, white, polycyclic borders. Skin:    General:  Skin is warm and dry.  Neurological:     General: No focal deficit present.     Mental Status: She is alert and oriented to person, place, and time.     Labs and Microbiology:    Latest Ref Rng & Units 08/20/2021    4:05 PM 07/17/2021    3:31 PM 02/06/2021   10:13 AM  CMP  Glucose 65 - 99 mg/dL 83  113  122   BUN 7 - 25 mg/dL '21  18  17   '$ Creatinine 0.50 - 0.99 mg/dL 1.07  0.97  1.09   Sodium 135 - 146 mmol/L 140  140  136   Potassium 3.5 - 5.3 mmol/L 4.3  4.2  4.6   Chloride 98 - 110 mmol/L 103  103  103   CO2 20 - 32 mmol/L '30  24  26   '$ Calcium 8.6 - 10.2 mg/dL 9.7  9.5  9.3   Total Protein 6.1 - 8.1 g/dL 7.0  6.4  6.7   Total Bilirubin 0.2 - 1.2 mg/dL 0.5  0.4  0.5   Alkaline Phos 44 - 121 IU/L  82    AST 10 - 30 U/L '17  16  16   '$ ALT 6 - 29 U/L '18  18  16       '$ Latest Ref Rng & Units 08/20/2021    4:05 PM 07/17/2021    3:31 PM 02/06/2021   10:13 AM  CBC  WBC 3.8 - 10.8 Thousand/uL 9.6  8.8  7.1   Hemoglobin 11.7 - 15.5 g/dL 14.3  13.6  14.6   Hematocrit 35.0 - 45.0 % 41.8  39.2  41.4   Platelets 140 - 400 Thousand/uL 339  340  332      Lab Results  Component Value Date   HIV1RNAQUANT Not Detected 05/04/2022   HIV1RNAQUANT <20 (H) 02/23/2022   HIV1RNAQUANT NOT DETECTED 01/01/2022   CD4TABS 1,061 02/23/2022   CD4TABS 1,434 08/20/2021   CD4TABS 1,521 05/15/2020    RPR and STI: Lab Results  Component Value Date   LABRPR NON-REACTIVE 02/23/2022   LABRPR NON-REACTIVE 08/20/2021   LABRPR NON-REACTIVE 02/06/2021   LABRPR NON-REACTIVE 05/15/2020   LABRPR NON-REACTIVE 08/28/2019    STI Results GC GC CT CT  08/21/2021 10:57 AM Negative   Negative    02/06/2021 10:33 AM Negative   Negative    05/16/2020 10:07 AM Negative   Negative    08/28/2019  4:04 PM Negative   Negative    09/21/2018 12:00 AM Negative   Negative    06/27/2018 12:00  AM Negative   Negative    01/02/2018 12:00 AM Negative   Negative    10/27/2017 12:00 AM Negative   Negative    08/03/2017 12:00 AM Negative   Negative    04/06/2017 12:00 AM Negative   Negative    08/13/2016 12:00 AM Negative   Negative    02/06/2016 12:00 AM Negative   Negative    11/05/2015 12:00 AM Negative   Negative    06/06/2015 12:00 AM Negative   Negative    12/26/2014 12:00 AM Negative   Negative    02/12/2013 11:13 AM  NEGATIVE   NEGATIVE   06/21/2010 12:52 AM   NEGATIVE (NOTE)  Testing performed using the BD ProbeTec Qx Chlamydia trachomatis and Neisseria gonorrhea amplified DNA assay.  Performed at:  [facility]  [address] [place], [place] [address]               [id number]    07/12/2007  8:56 PM   NEGATIVE (NOTE)  Testing performed using the BD Probetec ET Chlamydia trachomatis and Neisseria gonorrhea amplified DNA assay.      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  For now, will hold off on further antifungal therapy and observe.   Human immunodeficiency virus (HIV) disease ([place]) Will give Cabenuva today and check labs.  Follow up in 2 months with pharmacy, 4 months with myself.    Orders Placed This Encounter  Procedures   T-helper cell (CD4)- ([facility] clinic only)   HIV-1 RNA quant-no reflex-bld       [facility] 08/23/2022, 9:34 AM

## 2022-08-25 LAB — HIV-1 RNA QUANT-NO REFLEX-BLD
HIV 1 RNA Quant: <20 Copies/mL — ABNORMAL HIGH
HIV-1 RNA Quant, Log: <1.3 Log cps/mL — ABNORMAL HIGH

## 2022-08-26 LAB — HELPER T-LYMPH-CD4 (ARMC ONLY)
% CD 4 Pos. Lymph.: 40.6 % (ref 30.8–58.5)
Absolute CD 4 Helper: 1137 /uL (ref 359–1519)
Basophils Absolute: 0.1 10*3/uL (ref 0.0–0.2)
Basos: 1 %
EOS (ABSOLUTE): 0.1 10*3/uL (ref 0.0–0.4)
Eos: 1 %
Hematocrit: 46.2 % (ref 34.0–46.6)
Hemoglobin: 14.9 g/dL (ref 11.1–15.9)
Immature Grans (Abs): 0.1 10*3/uL (ref 0.0–0.1)
Immature Granulocytes: 1 %
Lymphocytes Absolute: 2.8 10*3/uL (ref 0.7–3.1)
Lymphs: 26 %
MCH: 31.4 pg (ref 26.6–33.0)
MCHC: 32.3 g/dL (ref 31.5–35.7)
MCV: 98 fL — ABNORMAL HIGH (ref 79–97)
Monocytes Absolute: 0.6 10*3/uL (ref 0.1–0.9)
Monocytes: 5 %
Neutrophils Absolute: 7.2 10*3/uL — ABNORMAL HIGH (ref 1.4–7.0)
Neutrophils: 66 %
Platelets: 365 10*3/uL (ref 150–450)
RBC: 4.74 x10E6/uL (ref 3.77–5.28)
RDW: 13.2 % (ref 11.7–15.4)
WBC: 10.8 10*3/uL (ref 3.4–10.8)

## 2022-09-11 DIAGNOSIS — J029 Acute pharyngitis, unspecified: Secondary | ICD-10-CM | POA: Diagnosis not present

## 2022-09-23 DIAGNOSIS — L089 Local infection of the skin and subcutaneous tissue, unspecified: Secondary | ICD-10-CM | POA: Diagnosis not present

## 2022-10-01 ENCOUNTER — Other Ambulatory Visit (HOSPITAL_COMMUNITY): Payer: Self-pay

## 2022-10-04 DIAGNOSIS — J4521 Mild intermittent asthma with (acute) exacerbation: Secondary | ICD-10-CM | POA: Diagnosis not present

## 2022-10-04 DIAGNOSIS — B349 Viral infection, unspecified: Secondary | ICD-10-CM | POA: Diagnosis not present

## 2022-10-11 DIAGNOSIS — Z01411 Encounter for gynecological examination (general) (routine) with abnormal findings: Secondary | ICD-10-CM | POA: Diagnosis not present

## 2022-10-11 DIAGNOSIS — R350 Frequency of micturition: Secondary | ICD-10-CM | POA: Diagnosis not present

## 2022-10-11 DIAGNOSIS — N946 Dysmenorrhea, unspecified: Secondary | ICD-10-CM | POA: Diagnosis not present

## 2022-10-11 DIAGNOSIS — Z1151 Encounter for screening for human papillomavirus (HPV): Secondary | ICD-10-CM | POA: Diagnosis not present

## 2022-10-11 DIAGNOSIS — Z01419 Encounter for gynecological examination (general) (routine) without abnormal findings: Secondary | ICD-10-CM | POA: Diagnosis not present

## 2022-10-11 DIAGNOSIS — Z882 Allergy status to sulfonamides status: Secondary | ICD-10-CM | POA: Diagnosis not present

## 2022-10-15 DIAGNOSIS — M7918 Myalgia, other site: Secondary | ICD-10-CM | POA: Diagnosis not present

## 2022-10-15 DIAGNOSIS — M542 Cervicalgia: Secondary | ICD-10-CM | POA: Diagnosis not present

## 2022-10-15 DIAGNOSIS — M47812 Spondylosis without myelopathy or radiculopathy, cervical region: Secondary | ICD-10-CM | POA: Diagnosis not present

## 2022-10-15 DIAGNOSIS — G8929 Other chronic pain: Secondary | ICD-10-CM | POA: Diagnosis not present

## 2022-10-20 DIAGNOSIS — L732 Hidradenitis suppurativa: Secondary | ICD-10-CM | POA: Diagnosis not present

## 2022-10-20 DIAGNOSIS — L700 Acne vulgaris: Secondary | ICD-10-CM | POA: Diagnosis not present

## 2022-10-22 ENCOUNTER — Ambulatory Visit (INDEPENDENT_AMBULATORY_CARE_PROVIDER_SITE_OTHER): Payer: BC Managed Care – PPO

## 2022-10-22 ENCOUNTER — Other Ambulatory Visit: Payer: Self-pay

## 2022-10-22 ENCOUNTER — Ambulatory Visit (INDEPENDENT_AMBULATORY_CARE_PROVIDER_SITE_OTHER): Payer: BC Managed Care – PPO | Admitting: Pharmacist

## 2022-10-22 DIAGNOSIS — Z23 Encounter for immunization: Secondary | ICD-10-CM | POA: Diagnosis not present

## 2022-10-22 DIAGNOSIS — B20 Human immunodeficiency virus [HIV] disease: Secondary | ICD-10-CM | POA: Diagnosis not present

## 2022-10-22 MED ORDER — CABOTEGRAVIR & RILPIVIRINE ER 600 & 900 MG/3ML IM SUER
1.0000 | Freq: Once | INTRAMUSCULAR | Status: AC
  Administered 2022-10-22: 1 via INTRAMUSCULAR

## 2022-10-22 NOTE — Progress Notes (Signed)
HPI: [name] is a 42 y.o. female who presents to the [facility] for Klahr administration.  Patient Active Problem List   Diagnosis Date Noted   Thrush 07/27/2022   Insomnia 10/15/2021   Not well controlled moderate persistent asthma 09/28/2021   Seasonal and perennial allergic rhinoconjunctivitis 09/28/2021   Other atopic dermatitis 07/20/2021   Hyperglycemia 08/28/2019   Obesity (BMI 35.0-39.9 without comorbidity) 07/26/2019   Costochondritis, acute 01/02/2018   Chronic tension headaches 01/23/2017   Hyperlipidemia 11/17/2015   Frequent sinus infections 06/30/2015   Hernia, abdominal 08/05/2014   LGSIL of cervix of undetermined significance 10/24/2013   Superficial Liver masses, right lobe; suspected dermoid recurrence.   05/07/2013   Ovarian cystic mass 05/07/2013   s/p Serosal repair of colon 12/04/2012 12/15/2012   Herpes genitalis in women 12/07/2012   Anxiety state, unspecified 12/07/2012   GERD (gastroesophageal reflux disease) 12/07/2012   Generalized anxiety disorder 12/07/2012   Pelvic mass in female s/p Robotic-assisted right salpingo-oophorectomy 12/04/2012   HEMORRHOIDS NOS, W/O COMPLICATIONS 99991111   Depression 11/25/2006   Other allergic rhinitis 11/25/2006   ASTHMA, COUGH VARIANT 11/15/2006   Human immunodeficiency virus (HIV) disease ([place]) 11/10/2006   Urticaria 11/10/2006    Patient's Medications  New Prescriptions   No medications on file  Previous Medications   ALBUTEROL (PROVENTIL) (2.5 MG/3ML) 0.083% NEBULIZER SOLUTION    Take 3 mLs (2.5 mg total) by nebulization every 4 (four) hours as needed for wheezing or shortness of breath (coughing fits).   ALBUTEROL (VENTOLIN HFA) 108 (90 BASE) MCG/ACT INHALER    Inhale 2 puffs into the lungs every 4 (four) hours as needed for wheezing or shortness of breath (coughing fits).   AZELASTINE (OPTIVAR) 0.05 % OPHTHALMIC SOLUTION    INSTILL 1 DROP IN BOTH EYES TWICE DAILY AS NEEDED FOR ITCHY  OR WATERY EYES   CABOTEGRAVIR & RILPIVIRINE ER (CABENUVA) 600 & 900 MG/3ML INJECTION    Inject 1 kit into the muscle every 2 (two) months.   CETIRIZINE (ZYRTEC) 10 MG TABLET    Take 2 tablets (20 mg total) by mouth at bedtime.   DENTA 5000 PLUS 1.1 % CREA DENTAL CREAM    USE SMALL AMOUNT ON TEETH AND GUMS EVERY EVENING SPIT OUT EXCESS AND DO NOT RINSE   ELASTIC BANDAGES & SUPPORTS (WRIST SPLINT) MISC    Wear splint as much as possible, Or nightly   ELASTIC BANDAGES & SUPPORTS (WRIST SPLINT/ELASTIC RIGHT MED) MISC    Wear Splint nightly.   FLUOCINOLONE ACETONIDE BODY 0.01 % OIL    Apply to scalp once daily as needed for itching/rash.   FLUOXETINE (PROZAC) 20 MG CAPSULE    TAKE ONE CAPSULE BY MOUTH DAILY ALONG WITH 40 MG CAPSULE   FLUOXETINE (PROZAC) 40 MG CAPSULE    Take 40 mg by mouth daily.   FLUTICASONE (FLONASE) 50 MCG/ACT NASAL SPRAY    Place 1 spray into both nostrils daily.   FLUTICASONE-SALMETEROL,SENSOR, (AIRDUO DIGIHALER) 232-14 MCG/ACT AEPB    Inhale 1 puff into the lungs in the morning and at bedtime. Rinse mouth after each use.   HYDROCORTISONE 2.5 % CREAM    Place rectally.   KETOCONAZOLE (NIZORAL) 2 % CREAM       LEVONORGESTREL (KYLEENA IU)    by Intrauterine route.   METHOCARBAMOL (ROBAXIN) 500 MG TABLET    Take 1 tablet (500 mg total) by mouth 2 (two) times daily.   MONTELUKAST (SINGULAIR) 10 MG TABLET    TAKE  1 TABLET BY MOUTH AT BEDTIME   OLOPATADINE-MOMETASONE (RYALTRIS) 665-25 MCG/ACT SUSP    Place 1-2 sprays into the nose in the morning and at bedtime.   OMEPRAZOLE (PRILOSEC) 40 MG CAPSULE    Take 1 capsule (40 mg total) by mouth daily. Nothing to eat or drink for 20-30 minutes.   OXYMETAZOLINE (AFRIN) 0.05 % NASAL SPRAY    Place 1 spray into both nostrils 2 (two) times daily. For <7 days at a time.   PHENTERMINE-TOPIRAMATE (QSYMIA) 3.75-23 MG CP24    Take 1 tablet by mouth daily.   PREGABALIN (LYRICA) 25 MG CAPSULE    Take by mouth.   QVAR REDIHALER 40 MCG/ACT INHALER     INHALE 2 PUFFS INTO THE LUNGS TWICE DAILY   TRETINOIN (RETIN-A) 0.025 % CREAM       TRIAMCINOLONE CREAM (KENALOG) 0.1 %    Apply 1 application topically 2 (two) times daily.   VALACYCLOVIR (VALTREX) 1000 MG TABLET    TAKE 1 TABLET(1000 MG) BY MOUTH DAILY   VRAYLAR 1.5 MG CAPSULE    Take 1.5 mg by mouth daily.  Modified Medications   No medications on file  Discontinued Medications   No medications on file    Allergies: Allergies  Allergen Reactions   Latuda [Lurasidone Hcl] Other (See Comments)    Occular dystonia   Seroquel [Quetiapine Fumarate] Swelling    Tongue swells    Benzoin Other (See Comments)    blisters   Latex Rash and Other (See Comments)   Sulfamethoxazole-Trimethoprim Itching and Nausea And Vomiting    Past Medical History: Past Medical History:  Diagnosis Date   Allergy    Anemia    Anxiety    Anxiety state, unspecified 12/07/2012   Arthritis    Asthma    Bipolar disorder, unspecified ([place]) 12/07/2012   Depression    Diabetes mellitus without complication (HCC)    no meds currently- controlled with diet   GERD (gastroesophageal reflux disease)    GERD (gastroesophageal reflux disease) 12/07/2012   Headache(784.0)    migraines   Herpes genitalis in women 12/07/2012   HIV infection ([place])    Human immunodeficiency virus (HIV) disease ([place]) 12/07/2012   Neuromuscular disorder ([place])    carpal tunnel in both arms   SHINGLES 01/31/2009   Qualifier: Diagnosis of  By: Tomma Lightning MD, Claiborne Billings     Type II or unspecified type diabetes mellitus without mention of complication, not stated as uncontrolled 12/07/2012   Unspecified asthma(493.90) 12/07/2012    Social History: Social History   Socioeconomic History   Marital status: Single    Spouse name: Not on file   Number of children: 1   Years of education: 12   Highest education level: Some college, no degree  Occupational History   Occupation: Nutrition Social research officer, government: [employer]  Tobacco Use    Smoking status: Never   Smokeless tobacco: Never  Vaping Use   Vaping Use: Never used  Substance and Sexual Activity   Alcohol use: Yes    Alcohol/week: 0.0 standard drinks of alcohol    Comment: OCC.about once a month    Drug use: No   Sexual activity: Never    Partners: Male    Birth control/protection: Implant  Other Topics Concern   Not on file  Social History Narrative   Patient lives alone.   Patient works at home.   Patient has hs education.   Patient has 1 child.   Patient drinks  multiple energy drinks and coffees throughout day, daily.      Patient is right-handed. She lives with her daughter in a one level home. She drinks one cup of coffee a day. She works for [employer] at [employer] in [place] and is very active at work.   Social Determinants of Health   Financial Resource Strain: Not on file  Food Insecurity: Not on file  Transportation Needs: Not on file  Physical Activity: Not on file  Stress: Not on file  Social Connections: Not on file    Labs: Lab Results  Component Value Date   HIV1RNAQUANT <20 (H) 08/23/2022   HIV1RNAQUANT Not Detected 05/04/2022   HIV1RNAQUANT <20 (H) 02/23/2022   CD4TABS 1,061 02/23/2022   CD4TABS 1,434 08/20/2021   CD4TABS 1,521 05/15/2020    RPR and STI Lab Results  Component Value Date   LABRPR NON-REACTIVE 02/23/2022   LABRPR NON-REACTIVE 08/20/2021   LABRPR NON-REACTIVE 02/06/2021   LABRPR NON-REACTIVE 05/15/2020   LABRPR NON-REACTIVE 08/28/2019    STI Results GC GC CT CT  08/21/2021 10:57 AM Negative   Negative    02/06/2021 10:33 AM Negative   Negative    05/16/2020 10:07 AM Negative   Negative    08/28/2019  4:04 PM Negative   Negative    09/21/2018 12:00 AM Negative   Negative    06/27/2018 12:00 AM Negative   Negative    01/02/2018 12:00 AM Negative   Negative    10/27/2017 12:00 AM Negative   Negative    08/03/2017 12:00 AM Negative   Negative    04/06/2017 12:00 AM Negative   Negative     08/13/2016 12:00 AM Negative   Negative    02/06/2016 12:00 AM Negative   Negative    11/05/2015 12:00 AM Negative   Negative    06/06/2015 12:00 AM Negative   Negative    12/26/2014 12:00 AM Negative   Negative    02/12/2013 11:13 AM  NEGATIVE   NEGATIVE   06/21/2010 12:52 AM   NEGATIVE (NOTE)  Testing performed using the BD ProbeTec Qx Chlamydia trachomatis and Neisseria gonorrhea amplified DNA assay.  Performed at:  [facility] [address], [place] [address]               [id number]    07/12/2007  8:56 PM   NEGATIVE (NOTE)  Testing performed using the BD Probetec ET Chlamydia trachomatis and Neisseria gonorrhea amplified DNA assay.      Hepatitis B Lab Results  Component Value Date   HEPBSAB REACTIVE (A) 08/03/2017   HEPBSAG NEG 11/10/2006   HEPBCAB NEG 11/10/2006   Hepatitis C Lab Results  Component Value Date   HEPCAB NON-REACTIVE 02/23/2022   Hepatitis A Lab Results  Component Value Date   HAV REACTIVE (A) 01/01/2022   Lipids: Lab Results  Component Value Date   CHOL 219 (H) 02/06/2021   TRIG 92 02/06/2021   HDL 46 (L) 02/06/2021   CHOLHDL 4.8 02/06/2021   VLDL 25 04/06/2017   LDLCALC 153 (H) 02/06/2021    TARGET DATE: The 6th   Assessment: [name] presents today for her maintenance Cabenuva injections. Past injections were tolerated well without issues.  Patient planning to move to [place] with her fiance and is in search of an ID clinic in that area. STI testing not requested during today's visit given no new partners since last appointment. No HIV RNA testing today as patient has remained undetectable and had HIV RNA testing at last appointment. Offered education regarding the most recent COVID bivalent vaccine and the patient was agreeable to receive the vaccine today.   Administered cabotegravir '600mg'$ /78m in left upper outer quadrant of the gluteal muscle.  Administered rilpivirine 900 mg/349min the right upper outer quadrant of the gluteal muscle. No issues with injections. Her will follow up in 2 months for next set of injections.  Plan: - Cabenuva injections administered - Next injections scheduled for 4/30 with Dr. WaJuleen Chinand 7/10 with [facility] Angelivalent vaccine given today  - Call with any issues or questions   SyBilley GoslingPharmD PGY1 Pharmacy Resident 3/8/202410:13 AM

## 2022-10-25 DIAGNOSIS — F411 Generalized anxiety disorder: Secondary | ICD-10-CM | POA: Diagnosis not present

## 2022-10-25 DIAGNOSIS — R739 Hyperglycemia, unspecified: Secondary | ICD-10-CM | POA: Diagnosis not present

## 2022-10-25 DIAGNOSIS — F321 Major depressive disorder, single episode, moderate: Secondary | ICD-10-CM | POA: Diagnosis not present

## 2022-11-01 DIAGNOSIS — Z1231 Encounter for screening mammogram for malignant neoplasm of breast: Secondary | ICD-10-CM | POA: Diagnosis not present

## 2022-11-01 DIAGNOSIS — R921 Mammographic calcification found on diagnostic imaging of breast: Secondary | ICD-10-CM | POA: Diagnosis not present

## 2022-11-02 DIAGNOSIS — B001 Herpesviral vesicular dermatitis: Secondary | ICD-10-CM | POA: Diagnosis not present

## 2022-11-02 DIAGNOSIS — K130 Diseases of lips: Secondary | ICD-10-CM | POA: Diagnosis not present

## 2022-11-03 DIAGNOSIS — B029 Zoster without complications: Secondary | ICD-10-CM | POA: Diagnosis not present

## 2022-11-04 DIAGNOSIS — L600 Ingrowing nail: Secondary | ICD-10-CM | POA: Diagnosis not present

## 2022-11-18 DIAGNOSIS — R921 Mammographic calcification found on diagnostic imaging of breast: Secondary | ICD-10-CM | POA: Diagnosis not present

## 2022-12-01 DIAGNOSIS — R921 Mammographic calcification found on diagnostic imaging of breast: Secondary | ICD-10-CM | POA: Diagnosis not present

## 2022-12-08 DIAGNOSIS — R928 Other abnormal and inconclusive findings on diagnostic imaging of breast: Secondary | ICD-10-CM | POA: Diagnosis not present

## 2022-12-08 DIAGNOSIS — R921 Mammographic calcification found on diagnostic imaging of breast: Secondary | ICD-10-CM | POA: Diagnosis not present

## 2022-12-08 DIAGNOSIS — N6012 Diffuse cystic mastopathy of left breast: Secondary | ICD-10-CM | POA: Diagnosis not present

## 2022-12-13 DIAGNOSIS — Z8719 Personal history of other diseases of the digestive system: Secondary | ICD-10-CM | POA: Diagnosis not present

## 2022-12-13 DIAGNOSIS — N939 Abnormal uterine and vaginal bleeding, unspecified: Secondary | ICD-10-CM | POA: Diagnosis not present

## 2022-12-13 DIAGNOSIS — Z98891 History of uterine scar from previous surgery: Secondary | ICD-10-CM | POA: Diagnosis not present

## 2022-12-13 DIAGNOSIS — Z30431 Encounter for routine checking of intrauterine contraceptive device: Secondary | ICD-10-CM | POA: Diagnosis not present

## 2022-12-14 ENCOUNTER — Encounter: Payer: Self-pay | Admitting: Internal Medicine

## 2022-12-14 ENCOUNTER — Other Ambulatory Visit: Payer: Self-pay

## 2022-12-14 ENCOUNTER — Ambulatory Visit (INDEPENDENT_AMBULATORY_CARE_PROVIDER_SITE_OTHER): Payer: BC Managed Care – PPO | Admitting: Internal Medicine

## 2022-12-14 VITALS — BP 118/73 | HR 84 | Temp 98.3°F | Ht 64.0 in | Wt 216.0 lb

## 2022-12-14 DIAGNOSIS — B20 Human immunodeficiency virus [HIV] disease: Secondary | ICD-10-CM | POA: Diagnosis not present

## 2022-12-14 MED ORDER — CABOTEGRAVIR & RILPIVIRINE ER 600 & 900 MG/3ML IM SUER
1.0000 | Freq: Once | INTRAMUSCULAR | Status: AC
  Administered 2022-12-14: 1 via INTRAMUSCULAR

## 2022-12-14 NOTE — Addendum Note (Signed)
Addended by: Philippa Chester on: 12/14/2022 04:24 PM   Modules accepted: Orders

## 2022-12-14 NOTE — Assessment & Plan Note (Signed)
Patient presents today for routine HIV follow-up.  She is currently on [place] with her last injection being 10/22/2022 with pharmacy.  I saw her last in early January.  Her viral load at that time was less than 20.  Will give injection today and follow-up in 2 months with pharmacy.  Plan to repeat VL at that time.

## 2022-12-14 NOTE — Progress Notes (Signed)
[facility]   CHIEF COMPLAINT    HIV follow up.    SUBJECTIVE:    [name] is a 42 y.o. female with PMHx as below who presents to the clinic for HIV follow up.   Please see A&P for the details of today's visit and status of the patient's medical problems.   Patient's Medications  New Prescriptions   No medications on file  Previous Medications   ALBUTEROL (PROVENTIL) (2.5 MG/3ML) 0.083% NEBULIZER SOLUTION    Take 3 mLs (2.5 mg total) by nebulization every 4 (four) hours as needed for wheezing or shortness of breath (coughing fits).   ALBUTEROL (VENTOLIN HFA) 108 (90 BASE) MCG/ACT INHALER    Inhale 2 puffs into the lungs every 4 (four) hours as needed for wheezing or shortness of breath (coughing fits).   AZELASTINE (OPTIVAR) 0.05 % OPHTHALMIC SOLUTION    INSTILL 1 DROP IN BOTH EYES TWICE DAILY AS NEEDED FOR ITCHY OR WATERY EYES   CABOTEGRAVIR & RILPIVIRINE ER (CABENUVA) 600 & 900 MG/3ML INJECTION    Inject 1 kit into the muscle every 2 (two) months.   CETIRIZINE (ZYRTEC) 10 MG TABLET    Take 2 tablets (20 mg total) by mouth at bedtime.   DENTA 5000 PLUS 1.1 % CREA DENTAL CREAM    USE SMALL AMOUNT ON TEETH AND GUMS EVERY EVENING SPIT OUT EXCESS AND DO NOT RINSE   ELASTIC BANDAGES & SUPPORTS (WRIST SPLINT) MISC    Wear splint as much as possible, Or nightly   ELASTIC BANDAGES & SUPPORTS (WRIST SPLINT/ELASTIC RIGHT MED) MISC    Wear Splint nightly.   FLUOCINOLONE ACETONIDE BODY 0.01 % OIL    Apply to scalp once daily as needed for itching/rash.   FLUOXETINE (PROZAC) 20 MG CAPSULE    TAKE ONE CAPSULE BY MOUTH DAILY ALONG WITH 40 MG CAPSULE   FLUOXETINE (PROZAC) 40 MG CAPSULE    Take 40 mg by mouth daily.   FLUTICASONE (FLONASE) 50 MCG/ACT NASAL SPRAY    Place 1 spray into both nostrils daily.   FLUTICASONE-SALMETEROL,SENSOR, (AIRDUO DIGIHALER) 232-14 MCG/ACT AEPB    Inhale 1 puff into the lungs in the morning and at bedtime. Rinse mouth after  each use.   HYDROCORTISONE 2.5 % CREAM    Place rectally.   KETOCONAZOLE (NIZORAL) 2 % CREAM       LEVONORGESTREL (KYLEENA IU)    by Intrauterine route.   METHOCARBAMOL (ROBAXIN) 500 MG TABLET    Take 1 tablet (500 mg total) by mouth 2 (two) times daily.   MONTELUKAST (SINGULAIR) 10 MG TABLET    TAKE 1 TABLET BY MOUTH AT BEDTIME   OLOPATADINE-MOMETASONE (RYALTRIS) 665-25 MCG/ACT SUSP    Place 1-2 sprays into the nose in the morning and at bedtime.   OMEPRAZOLE (PRILOSEC) 40 MG CAPSULE    Take 1 capsule (40 mg total) by mouth daily. Nothing to eat or drink for 20-30 minutes.   OXYMETAZOLINE (AFRIN) 0.05 % NASAL SPRAY    Place 1 spray into both nostrils 2 (two) times daily. For <7 days at a time.   PHENTERMINE-TOPIRAMATE (QSYMIA) 3.75-23 MG CP24    Take 1 tablet by mouth daily.   PREGABALIN (LYRICA) 25 MG CAPSULE    Take by mouth.   QVAR REDIHALER 40 MCG/ACT INHALER    INHALE 2 PUFFS INTO THE LUNGS TWICE DAILY   TRETINOIN (RETIN-A) 0.025 % CREAM       TRIAMCINOLONE  CREAM (KENALOG) 0.1 %    Apply 1 application topically 2 (two) times daily.   VALACYCLOVIR (VALTREX) 1000 MG TABLET    TAKE 1 TABLET(1000 MG) BY MOUTH DAILY   VRAYLAR 1.5 MG CAPSULE    Take 1.5 mg by mouth daily.  Modified Medications   No medications on file  Discontinued Medications   No medications on file      Past Medical History:  Diagnosis Date   Allergy    Anemia    Anxiety    Anxiety state, unspecified 12/07/2012   Arthritis    Asthma    Bipolar disorder, unspecified (HCC) 12/07/2012   Depression    Diabetes mellitus without complication (HCC)    no meds currently- controlled with diet   GERD (gastroesophageal reflux disease)    GERD (gastroesophageal reflux disease) 12/07/2012   Headache(784.0)    migraines   Herpes genitalis in women 12/07/2012   HIV infection (HCC)    Human immunodeficiency virus (HIV) disease (HCC) 12/07/2012   Neuromuscular disorder (HCC)    carpal tunnel in both arms   SHINGLES 01/31/2009    Qualifier: Diagnosis of  By: Philipp Deputy MD, Tresa Endo     Type II or unspecified type diabetes mellitus without mention of complication, not stated as uncontrolled 12/07/2012   Unspecified asthma(493.90) 12/07/2012    Social History   Tobacco Use   Smoking status: Never   Smokeless tobacco: Never  Vaping Use   Vaping Use: Never used  Substance Use Topics   Alcohol use: Yes    Alcohol/week: 0.0 standard drinks of alcohol    Comment: OCC.about once a month    Drug use: No    Family History  Problem Relation Age of Onset   Arthritis/Rheumatoid Mother    Hypertension Mother    Arthritis/Rheumatoid Father    Cancer Maternal Grandmother        breasts   Cancer Paternal Grandmother        breast and kidney   Cancer Maternal Aunt        brand and lung    Allergies  Allergen Reactions   Latuda [Lurasidone Hcl] Other (See Comments)    Occular dystonia   Seroquel [Quetiapine Fumarate] Swelling    Tongue swells    Benzoin Other (See Comments)    blisters   Latex Rash and Other (See Comments)   Sulfamethoxazole-Trimethoprim Itching and Nausea And Vomiting    Review of Systems  Constitutional: Negative.   Gastrointestinal: Negative.      OBJECTIVE:    Vitals:   12/14/22 1438  BP: 118/73  Pulse: 84  Temp: 98.3 F (36.8 C)  TempSrc: Temporal  SpO2: 100%  Weight: 216 lb (98 kg)  Height: 5\' 4"  (1.626 m)     Body mass index is 37.08 kg/m.  Physical Exam Constitutional:      Appearance: Normal appearance.  Eyes:     Extraocular Movements: Extraocular movements intact.     Conjunctiva/sclera: Conjunctivae normal.  Pulmonary:     Effort: Pulmonary effort is normal. No respiratory distress.  Skin:    General: Skin is warm and dry.  Neurological:     General: No focal deficit present.     Mental Status: She is alert and oriented to person, place, and time.  Psychiatric:        Behavior: Behavior normal.     Labs and Microbiology:    Latest Ref Rng & Units  08/20/2021    4:05 PM 07/17/2021    3:31  PM 02/06/2021   10:13 AM  CMP  Glucose 65 - 99 mg/dL 83  191  478   BUN 7 - 25 mg/dL 21  18  17    Creatinine 0.50 - 0.99 mg/dL 2.95  6.21  3.08   Sodium 135 - 146 mmol/L 140  140  136   Potassium 3.5 - 5.3 mmol/L 4.3  4.2  4.6   Chloride 98 - 110 mmol/L 103  103  103   CO2 20 - 32 mmol/L 30  24  26    Calcium 8.6 - 10.2 mg/dL 9.7  9.5  9.3   Total Protein 6.1 - 8.1 g/dL 7.0  6.4  6.7   Total Bilirubin 0.2 - 1.2 mg/dL 0.5  0.4  0.5   Alkaline Phos 44 - 121 IU/L  82    AST 10 - 30 U/L 17  16  16    ALT 6 - 29 U/L 18  18  16        Latest Ref Rng & Units 08/23/2022    9:33 AM 08/20/2021    4:05 PM 07/17/2021    3:31 PM  CBC  WBC 3.4 - 10.8 x10E3/uL 10.8  9.6  8.8   Hemoglobin 11.1 - 15.9 g/dL 65.7  84.6  96.2   Hematocrit 34.0 - 46.6 % 46.2  41.8  39.2   Platelets 150 - 450 x10E3/uL 365  339  340      Lab Results  Component Value Date   HIV1RNAQUANT <20 (H) 08/23/2022   HIV1RNAQUANT Not Detected 05/04/2022   HIV1RNAQUANT <20 (H) 02/23/2022   CD4TABS 1,061 02/23/2022   CD4TABS 1,434 08/20/2021   CD4TABS 1,521 05/15/2020    RPR and STI: Lab Results  Component Value Date   LABRPR NON-REACTIVE 02/23/2022   LABRPR NON-REACTIVE 08/20/2021   LABRPR NON-REACTIVE 02/06/2021   LABRPR NON-REACTIVE 05/15/2020   LABRPR NON-REACTIVE 08/28/2019    STI Results GC GC CT CT  08/21/2021 10:57 AM Negative   Negative    02/06/2021 10:33 AM Negative   Negative    05/16/2020 10:07 AM Negative   Negative    08/28/2019  4:04 PM Negative   Negative    09/21/2018 12:00 AM Negative   Negative    06/27/2018 12:00 AM Negative   Negative    01/02/2018 12:00 AM Negative   Negative    10/27/2017 12:00 AM Negative   Negative    08/03/2017 12:00 AM Negative   Negative    04/06/2017 12:00 AM Negative   Negative    08/13/2016 12:00 AM Negative   Negative    02/06/2016 12:00 AM Negative   Negative    11/05/2015 12:00 AM Negative   Negative    06/06/2015 12:00 AM  Negative   Negative    12/26/2014 12:00 AM Negative   Negative    02/12/2013 11:13 AM  NEGATIVE   NEGATIVE   06/21/2010 12:52 AM   NEGATIVE (NOTE)  Testing performed using the BD ProbeTec Qx Chlamydia trachomatis and Neisseria gonorrhea amplified DNA assay.  Performed at:  [facility]               [address]                [place], [place] [address]               [id number]    07/12/2007  8:56 PM   NEGATIVE (NOTE)  Testing performed using the BD Probetec ET Chlamydia trachomatis and Neisseria gonorrhea amplified DNA assay.      Hepatitis B: Lab Results  Component Value Date   HEPBSAB REACTIVE (A) 08/03/2017   HEPBSAG NEG 11/10/2006   HEPBCAB NEG 11/10/2006   Hepatitis C: Lab Results  Component Value Date   HEPCAB NON-REACTIVE 02/23/2022   Hepatitis A: Lab Results  Component Value Date   HAV REACTIVE (A) 01/01/2022   Lipids: Lab Results  Component Value Date   CHOL 219 (H) 02/06/2021   TRIG 92 02/06/2021   HDL 46 (L) 02/06/2021   CHOLHDL 4.8 02/06/2021   VLDL 25 04/06/2017   LDLCALC 153 (H) 02/06/2021      ASSESSMENT & PLAN:    Human immunodeficiency virus (HIV) disease (HCC) Patient presents today for routine HIV follow-up.  She is currently on [place] with her last injection being 10/22/2022 with pharmacy.  I saw her last in early January.  Her viral load at that time was less than 20.  Will give injection today and follow-up in 2 months with pharmacy.  Plan to repeat VL at that time.    No orders of the defined types were placed in this encounter.     Vedia Coffer for Infectious Disease [facility] 12/14/2022, 3:04 PM

## 2022-12-15 DIAGNOSIS — N946 Dysmenorrhea, unspecified: Secondary | ICD-10-CM | POA: Diagnosis not present

## 2022-12-15 DIAGNOSIS — N8003 Adenomyosis of the uterus: Secondary | ICD-10-CM | POA: Diagnosis not present

## 2022-12-15 DIAGNOSIS — N939 Abnormal uterine and vaginal bleeding, unspecified: Secondary | ICD-10-CM | POA: Diagnosis not present

## 2022-12-16 ENCOUNTER — Telehealth: Payer: Self-pay | Admitting: Internal Medicine

## 2022-12-16 DIAGNOSIS — B20 Human immunodeficiency virus [HIV] disease: Secondary | ICD-10-CM

## 2022-12-16 NOTE — Telephone Encounter (Signed)
Left voicemail asking patient to return my call. If patient calls front desk - she just needs a lab appointment.   Oree Hislop Lesli Albee, CMA

## 2022-12-16 NOTE — Telephone Encounter (Signed)
Received phone call from patients OB/Gyn (Dr Barbee Cough) at [facility] regarding her abnormal uterine bleeding refractory to initial treatment and plan for starting [facility].  This medications has a theoretical risk of interacting with her Cabenuva injections.  Dr Barbee Cough discussed with clinical pharmacist at [facility] who recommended closer VL monitoring to ensure she remains undetectable. She will likely start the medication in about 2 weeks due to the need to obtain a prior authorization.  Will place lab order for viral load to be done in the next 1-2 weeks for patient.  She will RTC for next Cabenuva injection in July as scheduled and we will continue to monitor VL closely with starting Oriahnn.   Vedia Coffer for Infectious Disease [facility] 12/16/2022, 10:41 AM

## 2022-12-16 NOTE — Telephone Encounter (Signed)
Never heard of this medicine before - it is a moderate CYP3A4 inducer, and the main concern is risk for decreased rilpivirine concentrations. Definitely agree to monitor viral load very closely. I know there have not been any direct studies with these two medicines, but this drug is classified as being a similar inducer to rifabutin. Per Cabenuva's package insert, rifabutin is contraindicated with Cabenuva due to the concern for reduced rilpivirine concentrations. I think there may need to be a risk/benefit discussion soon about continuing Cabenuva with this new medication. Let me know your thoughts. Marchelle Folks

## 2022-12-17 NOTE — Telephone Encounter (Signed)
I agree with Marchelle Folks! If it has the potential to decrease rilpivirine levels, I would probably have her switch to orals as long as she is on this medication.

## 2022-12-20 DIAGNOSIS — N858 Other specified noninflammatory disorders of uterus: Secondary | ICD-10-CM | POA: Diagnosis not present

## 2022-12-20 DIAGNOSIS — K219 Gastro-esophageal reflux disease without esophagitis: Secondary | ICD-10-CM | POA: Diagnosis not present

## 2022-12-20 DIAGNOSIS — Z91040 Latex allergy status: Secondary | ICD-10-CM | POA: Diagnosis not present

## 2022-12-20 DIAGNOSIS — N939 Abnormal uterine and vaginal bleeding, unspecified: Secondary | ICD-10-CM | POA: Diagnosis not present

## 2022-12-20 DIAGNOSIS — Z8541 Personal history of malignant neoplasm of cervix uteri: Secondary | ICD-10-CM | POA: Diagnosis not present

## 2022-12-20 DIAGNOSIS — Z881 Allergy status to other antibiotic agents status: Secondary | ICD-10-CM | POA: Diagnosis not present

## 2022-12-20 DIAGNOSIS — Z30432 Encounter for removal of intrauterine contraceptive device: Secondary | ICD-10-CM | POA: Diagnosis not present

## 2022-12-20 DIAGNOSIS — Z888 Allergy status to other drugs, medicaments and biological substances status: Secondary | ICD-10-CM | POA: Diagnosis not present

## 2022-12-20 DIAGNOSIS — Z882 Allergy status to sulfonamides status: Secondary | ICD-10-CM | POA: Diagnosis not present

## 2022-12-20 DIAGNOSIS — Z21 Asymptomatic human immunodeficiency virus [HIV] infection status: Secondary | ICD-10-CM | POA: Diagnosis not present

## 2022-12-20 DIAGNOSIS — Q51810 Arcuate uterus: Secondary | ICD-10-CM | POA: Diagnosis not present

## 2022-12-20 DIAGNOSIS — N946 Dysmenorrhea, unspecified: Secondary | ICD-10-CM | POA: Diagnosis not present

## 2022-12-20 DIAGNOSIS — N8003 Adenomyosis of the uterus: Secondary | ICD-10-CM | POA: Diagnosis not present

## 2022-12-20 DIAGNOSIS — N9412 Deep dyspareunia: Secondary | ICD-10-CM | POA: Diagnosis not present

## 2022-12-20 DIAGNOSIS — J45909 Unspecified asthma, uncomplicated: Secondary | ICD-10-CM | POA: Diagnosis not present

## 2022-12-20 DIAGNOSIS — R102 Pelvic and perineal pain: Secondary | ICD-10-CM | POA: Diagnosis not present

## 2022-12-20 DIAGNOSIS — N840 Polyp of corpus uteri: Secondary | ICD-10-CM | POA: Diagnosis not present

## 2022-12-20 DIAGNOSIS — G44229 Chronic tension-type headache, not intractable: Secondary | ICD-10-CM | POA: Diagnosis not present

## 2022-12-20 NOTE — Telephone Encounter (Signed)
Attempted to contact patient for lab appointment needed for closer monitoring of VL. Triage will continue to reach out. Also will send patient a mychart message for scheduling.  Dr. Earlene Plater did you want triage to assist with sooner provider visit to discuss risk/benefits of medication?

## 2022-12-21 NOTE — Telephone Encounter (Signed)
Spoke with Dolores, she will come in this Thursday to discuss further with pharmacy and have repeat VL drawn.   Sandie Ano, RN

## 2022-12-21 NOTE — Telephone Encounter (Signed)
Thanks Megan!

## 2022-12-22 DIAGNOSIS — E669 Obesity, unspecified: Secondary | ICD-10-CM | POA: Diagnosis not present

## 2022-12-22 NOTE — Progress Notes (Signed)
12/22/2022  HPI: [name] is a 42 y.o. female who presents to the [facility] clinic today to follow up for monitoring following initiation of a new medication, Oriahnn from [place].  Patient Active Problem List   Diagnosis Date Noted   Thrush 07/27/2022   Insomnia 10/15/2021   Not well controlled moderate persistent asthma 09/28/2021   Seasonal and perennial allergic rhinoconjunctivitis 09/28/2021   Other atopic dermatitis 07/20/2021   Hyperglycemia 08/28/2019   Obesity (BMI 35.0-39.9 without comorbidity) 07/26/2019   Costochondritis, acute 01/02/2018   Chronic tension headaches 01/23/2017   Hyperlipidemia 11/17/2015   Frequent sinus infections 06/30/2015   Hernia, abdominal 08/05/2014   LGSIL of cervix of undetermined significance 10/24/2013   Superficial Liver masses, right lobe; suspected dermoid recurrence.   05/07/2013   Ovarian cystic mass 05/07/2013   s/p Serosal repair of colon 12/04/2012 12/15/2012   Herpes genitalis in women 12/07/2012   Anxiety state, unspecified 12/07/2012   GERD (gastroesophageal reflux disease) 12/07/2012   Generalized anxiety disorder 12/07/2012   Pelvic mass in female s/p Robotic-assisted right salpingo-oophorectomy 12/04/2012   HEMORRHOIDS NOS, W/O COMPLICATIONS 12/07/2006   Depression 11/25/2006   Other allergic rhinitis 11/25/2006   ASTHMA, COUGH VARIANT 11/15/2006   Human immunodeficiency virus (HIV) disease (HCC) 11/10/2006   Urticaria 11/10/2006    Patient's Medications  New Prescriptions   No medications on file  Previous Medications   ALBUTEROL (PROVENTIL) (2.5 MG/3ML) 0.083% NEBULIZER SOLUTION    Take 3 mLs (2.5 mg total) by nebulization every 4 (four) hours as needed for wheezing or shortness of breath (coughing fits).   ALBUTEROL (VENTOLIN HFA) 108 (90 BASE) MCG/ACT INHALER    Inhale 2 puffs into the lungs every 4 (four) hours as needed for wheezing or shortness of breath (coughing fits).   AZELASTINE (OPTIVAR) 0.05 % OPHTHALMIC  SOLUTION    INSTILL 1 DROP IN BOTH EYES TWICE DAILY AS NEEDED FOR ITCHY OR WATERY EYES   CABOTEGRAVIR & RILPIVIRINE ER (CABENUVA) 600 & 900 MG/3ML INJECTION    Inject 1 kit into the muscle every 2 (two) months.   CETIRIZINE (ZYRTEC) 10 MG TABLET    Take 2 tablets (20 mg total) by mouth at bedtime.   DENTA 5000 PLUS 1.1 % CREA DENTAL CREAM    USE SMALL AMOUNT ON TEETH AND GUMS EVERY EVENING SPIT OUT EXCESS AND DO NOT RINSE   ELASTIC BANDAGES & SUPPORTS (WRIST SPLINT) MISC    Wear splint as much as possible, Or nightly   ELASTIC BANDAGES & SUPPORTS (WRIST SPLINT/ELASTIC RIGHT MED) MISC    Wear Splint nightly.   FLUOCINOLONE ACETONIDE BODY 0.01 % OIL    Apply to scalp once daily as needed for itching/rash.   FLUOXETINE (PROZAC) 20 MG CAPSULE    TAKE ONE CAPSULE BY MOUTH DAILY ALONG WITH 40 MG CAPSULE   FLUOXETINE (PROZAC) 40 MG CAPSULE    Take 40 mg by mouth daily.   FLUTICASONE (FLONASE) 50 MCG/ACT NASAL SPRAY    Place 1 spray into both nostrils daily.   FLUTICASONE-SALMETEROL,SENSOR, (AIRDUO DIGIHALER) 232-14 MCG/ACT AEPB    Inhale 1 puff into the lungs in the morning and at bedtime. Rinse mouth after each use.   HYDROCORTISONE 2.5 % CREAM    Place rectally.   KETOCONAZOLE (NIZORAL) 2 % CREAM       LEVONORGESTREL (KYLEENA IU)    by Intrauterine route.   METHOCARBAMOL (ROBAXIN) 500 MG TABLET    Take 1 tablet (500 mg total) by mouth 2 (two)  times daily.   MONTELUKAST (SINGULAIR) 10 MG TABLET    TAKE 1 TABLET BY MOUTH AT BEDTIME   OLOPATADINE-MOMETASONE (RYALTRIS) 665-25 MCG/ACT SUSP    Place 1-2 sprays into the nose in the morning and at bedtime.   OMEPRAZOLE (PRILOSEC) 40 MG CAPSULE    Take 1 capsule (40 mg total) by mouth daily. Nothing to eat or drink for 20-30 minutes.   OXYMETAZOLINE (AFRIN) 0.05 % NASAL SPRAY    Place 1 spray into both nostrils 2 (two) times daily. For <7 days at a time.   PHENTERMINE-TOPIRAMATE (QSYMIA) 3.75-23 MG CP24    Take 1 tablet by mouth daily.   PREGABALIN (LYRICA) 25  MG CAPSULE    Take by mouth.   QVAR REDIHALER 40 MCG/ACT INHALER    INHALE 2 PUFFS INTO THE LUNGS TWICE DAILY   TRETINOIN (RETIN-A) 0.025 % CREAM       TRIAMCINOLONE CREAM (KENALOG) 0.1 %    Apply 1 application topically 2 (two) times daily.   VALACYCLOVIR (VALTREX) 1000 MG TABLET    TAKE 1 TABLET(1000 MG) BY MOUTH DAILY   VRAYLAR 1.5 MG CAPSULE    Take 1.5 mg by mouth daily.  Modified Medications   No medications on file  Discontinued Medications   No medications on file    Allergies: Allergies  Allergen Reactions   Latuda [Lurasidone Hcl] Other (See Comments)    Occular dystonia   Seroquel [Quetiapine Fumarate] Swelling    Tongue swells    Benzoin Other (See Comments)    blisters   Latex Rash and Other (See Comments)   Sulfamethoxazole-Trimethoprim Itching and Nausea And Vomiting    Past Medical History: Past Medical History:  Diagnosis Date   Allergy    Anemia    Anxiety    Anxiety state, unspecified 12/07/2012   Arthritis    Asthma    Bipolar disorder, unspecified (HCC) 12/07/2012   Depression    Diabetes mellitus without complication (HCC)    no meds currently- controlled with diet   GERD (gastroesophageal reflux disease)    GERD (gastroesophageal reflux disease) 12/07/2012   Headache(784.0)    migraines   Herpes genitalis in women 12/07/2012   HIV infection (HCC)    Human immunodeficiency virus (HIV) disease (HCC) 12/07/2012   Neuromuscular disorder (HCC)    carpal tunnel in both arms   SHINGLES 01/31/2009   Qualifier: Diagnosis of  By: Philipp Deputy MD, Tresa Endo     Type II or unspecified type diabetes mellitus without mention of complication, not stated as uncontrolled 12/07/2012   Unspecified asthma(493.90) 12/07/2012    Social History: Social History   Socioeconomic History   Marital status: Single    Spouse name: Not on file   Number of children: 1   Years of education: 12   Highest education level: Some college, no degree  Occupational History   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   Patient works at home.   Patient  has hs education.   Patient has 1 child.   Patient drinks multiple energy drinks and coffees throughout day, daily.      Patient is right-handed. She lives with her daughter in a one level home. She drinks one cup of coffee a day. She works for [employer] at [employer] in [employer] and is very active at work.   Social Determinants of Health   Financial Resource Strain: Not on file  Food Insecurity: Not on file  Transportation Needs: Not on file  Physical Activity: Not on file  Stress: Not on file  Social Connections: Not on file    Assessment: [name] is a 42 yo female presenting to ID clinic today for close HIV viral load monitoring. She was recently seen by OB/Gyn at [facility] for management of abnormal uterine bleeding. Dr. Barbee Cough plans to initiate Milinda Pointer, which interacts with Cabenuva. Milinda Pointer is a moderate CYP3A4 inducer. CYP3A4 Inducers (Oriahn) may decrease the serum concentration of Rilpivirine, leading to decreased efficacy. Increased monitoring is recommended. Rifabutin, a different moderate CYP3A4 inducer, is contraindicated with Cabenuva per the package insert.  Milinda Pointer has not been initiated as of yet. She reports that she has trialed various medications for heavy menstrual bleeding, including the [place] IUD, without success. She is currently taking progesterone in the interim prior to [place] initiation.   Discussed the DDI between  [place] with the patient. If her OB/Gyn plans to move forward with Milinda Pointer, we recommend discontinuation of Cabenuva due to the risk of decreased efficacy and developing drug resistance. [name] is not wanting to switch from [place] to oral therapy. We will reach out to Dr. Barbee Cough to discuss if there are any other options she could pursue at this time, such as Myfembree. Myfembree is a minor CYP3A4 inducer. This was discussed as a possible option in the office visit with Dr. Barbee Cough on 12/15/22.   Plan: - Obtain HIV RNA and CD4 count  - Follow up on 02/23/23 for Cabenuva injection  Irish Elders, PharmD PGY-1 [facility] Resident

## 2022-12-23 ENCOUNTER — Other Ambulatory Visit: Payer: Self-pay

## 2022-12-23 ENCOUNTER — Ambulatory Visit (INDEPENDENT_AMBULATORY_CARE_PROVIDER_SITE_OTHER): Payer: BC Managed Care – PPO | Admitting: Pharmacist

## 2022-12-23 DIAGNOSIS — B20 Human immunodeficiency virus [HIV] disease: Secondary | ICD-10-CM

## 2022-12-23 DIAGNOSIS — Z113 Encounter for screening for infections with a predominantly sexual mode of transmission: Secondary | ICD-10-CM | POA: Diagnosis not present

## 2022-12-24 ENCOUNTER — Telehealth: Payer: Self-pay

## 2022-12-24 LAB — T-HELPER CELLS (CD4) COUNT (NOT AT ARMC)
CD4 % Helper T Cell: 45 % (ref 33–65)
CD4 T Cell Abs: 1729 /uL (ref 400–1790)

## 2022-12-24 NOTE — Telephone Encounter (Signed)
We have not reached out to her OB. It sounds like [name] is still undergoing prior auth and may take a while. I was deferring to Dr. Earlene Plater to speak with her OB since he previously discussed the plan with them. If that will cause any delay, pharmacy is happy to reach out! - Marchelle Folks

## 2022-12-24 NOTE — Telephone Encounter (Signed)
[name] came to [facility] clinic on 12/24/22 to discuss new therapy initiation of abnormal uterine bleeding, which is managed by Dr. Barbee Cough, an OB/Gyn at [facility]. Dr. Barbee Cough plans to initiate [name], which interacts with Cabenuva. [name] is a moderate CYP3A4 inducer. CYP3A4 inducers ([name]) may decrease the serum concentration of [name], leading to decreased efficacy. [name], a different moderate CYP3A4 inducer, is contraindicated with Cabenuva per the package insert. Dr. Barbee Cough had discussed other therapy options, such as [name] and [name]. [name] is a minor CYP3A4 inducer. This could be a safer option for [name] if Dr. Barbee Cough would like to trial this instead.   If her OB/Gyn plans to move forward with [name], we recommend discontinuation of Cabenuva due to the risk of decreased efficacy and developing drug resistance. [name] is not wanting to switch from [name] to oral therapy at this time.   Irish Elders, PharmD PGY-1 [facility] Pharm[facility] Resident

## 2022-12-24 NOTE — Telephone Encounter (Signed)
Thank you!    Stephanie

## 2022-12-24 NOTE — Telephone Encounter (Signed)
I called to speak with triage at Dr. Ladonna Snide office. I requested that she not go through with [place] and use Myfembree first d/t more favorable action with Cabenuva.   I gave them provider line's phone number and let them know that Dr. Earlene Plater would be back Monday if further discussion is needed.   Sounds like pharmacy team counseled the patient well yesterday to avoid combination of Orhiann + Cabenuva.

## 2022-12-25 LAB — HIV-1 RNA QUANT-NO REFLEX-BLD
HIV 1 RNA Quant: 98 Copies/mL — ABNORMAL HIGH
HIV-1 RNA Quant, Log: 1.99 Log cps/mL — ABNORMAL HIGH

## 2022-12-26 ENCOUNTER — Encounter: Payer: Self-pay | Admitting: Internal Medicine

## 2022-12-26 DIAGNOSIS — B20 Human immunodeficiency virus [HIV] disease: Secondary | ICD-10-CM

## 2022-12-27 ENCOUNTER — Telehealth: Payer: Self-pay

## 2022-12-27 NOTE — Telephone Encounter (Signed)
Called patient regarding the viral load blip that was seen this month. Educated that this can happen and there is nothing to be concerned about.  Scheduled a follow up lab appointment for 01/24/23 to recheck her viral load.  Irish Elders, PharmD PGY-1 [facility] Pharm[facility] Resident

## 2023-01-07 DIAGNOSIS — G629 Polyneuropathy, unspecified: Secondary | ICD-10-CM | POA: Diagnosis not present

## 2023-01-07 DIAGNOSIS — N946 Dysmenorrhea, unspecified: Secondary | ICD-10-CM | POA: Diagnosis not present

## 2023-01-07 DIAGNOSIS — N921 Excessive and frequent menstruation with irregular cycle: Secondary | ICD-10-CM | POA: Diagnosis not present

## 2023-01-24 ENCOUNTER — Other Ambulatory Visit: Payer: BC Managed Care – PPO

## 2023-01-24 ENCOUNTER — Other Ambulatory Visit: Payer: Self-pay

## 2023-01-24 DIAGNOSIS — B20 Human immunodeficiency virus [HIV] disease: Secondary | ICD-10-CM | POA: Diagnosis not present

## 2023-01-24 DIAGNOSIS — E669 Obesity, unspecified: Secondary | ICD-10-CM | POA: Diagnosis not present

## 2023-01-27 LAB — HIV-1 RNA QUANT-NO REFLEX-BLD
HIV 1 RNA Quant: NOT DETECTED Copies/mL
HIV-1 RNA Quant, Log: NOT DETECTED Log cps/mL

## 2023-02-23 ENCOUNTER — Other Ambulatory Visit: Payer: Self-pay

## 2023-02-23 ENCOUNTER — Ambulatory Visit: Payer: BC Managed Care – PPO | Admitting: Pharmacist

## 2023-02-23 DIAGNOSIS — B20 Human immunodeficiency virus [HIV] disease: Secondary | ICD-10-CM

## 2023-02-23 MED ORDER — CABOTEGRAVIR & RILPIVIRINE ER 600 & 900 MG/3ML IM SUER
1.0000 | Freq: Once | INTRAMUSCULAR | Status: AC
Start: 2023-02-23 — End: 2023-02-23
  Administered 2023-02-23: 1 via INTRAMUSCULAR

## 2023-02-23 NOTE — Progress Notes (Signed)
HPI: [name] is a 42 y.o. female who presents to the [facility] pharmacy clinic for [place] administration.  Patient Active Problem List   Diagnosis Date Noted   Thrush 07/27/2022   Insomnia 10/15/2021   Not well controlled moderate persistent asthma 09/28/2021   Seasonal and perennial allergic rhinoconjunctivitis 09/28/2021   Other atopic dermatitis 07/20/2021   Hyperglycemia 08/28/2019   Obesity (BMI 35.0-39.9 without comorbidity) 07/26/2019   Costochondritis, acute 01/02/2018   Chronic tension headaches 01/23/2017   Hyperlipidemia 11/17/2015   Frequent sinus infections 06/30/2015   Hernia, abdominal 08/05/2014   LGSIL of cervix of undetermined significance 10/24/2013   Superficial Liver masses, right lobe; suspected dermoid recurrence.   05/07/2013   Ovarian cystic mass 05/07/2013   s/p Serosal repair of colon 12/04/2012 12/15/2012   Herpes genitalis in women 12/07/2012   Anxiety state, unspecified 12/07/2012   GERD (gastroesophageal reflux disease) 12/07/2012   Generalized anxiety disorder 12/07/2012   Pelvic mass in female s/p Robotic-assisted right salpingo-oophorectomy 12/04/2012   HEMORRHOIDS NOS, W/O COMPLICATIONS 12/07/2006   Depression 11/25/2006   Other allergic rhinitis 11/25/2006   ASTHMA, COUGH VARIANT 11/15/2006   Human immunodeficiency virus (HIV) disease (HCC) 11/10/2006   Urticaria 11/10/2006    Patient's Medications  New Prescriptions   No medications on file  Previous Medications   ALBUTEROL (PROVENTIL) (2.5 MG/3ML) 0.083% NEBULIZER SOLUTION    Take 3 mLs (2.5 mg total) by nebulization every 4 (four) hours as needed for wheezing or shortness of breath (coughing fits).   ALBUTEROL (VENTOLIN HFA) 108 (90 BASE) MCG/ACT INHALER    Inhale 2 puffs into the lungs every 4 (four) hours as needed for wheezing or shortness of breath (coughing fits).   AZELASTINE (OPTIVAR) 0.05 % OPHTHALMIC SOLUTION    INSTILL 1 DROP IN BOTH EYES TWICE DAILY AS NEEDED FOR ITCHY  OR WATERY EYES   CABOTEGRAVIR & RILPIVIRINE ER (CABENUVA) 600 & 900 MG/3ML INJECTION    Inject 1 kit into the muscle every 2 (two) months.   CETIRIZINE (ZYRTEC) 10 MG TABLET    Take 2 tablets (20 mg total) by mouth at bedtime.   DENTA 5000 PLUS 1.1 % CREA DENTAL CREAM    USE SMALL AMOUNT ON TEETH AND GUMS EVERY EVENING SPIT OUT EXCESS AND DO NOT RINSE   ELASTIC BANDAGES & SUPPORTS (WRIST SPLINT) MISC    Wear splint as much as possible, Or nightly   ELASTIC BANDAGES & SUPPORTS (WRIST SPLINT/ELASTIC RIGHT MED) MISC    Wear Splint nightly.   FLUOCINOLONE ACETONIDE BODY 0.01 % OIL    Apply to scalp once daily as needed for itching/rash.   FLUOXETINE (PROZAC) 20 MG CAPSULE    TAKE ONE CAPSULE BY MOUTH DAILY ALONG WITH 40 MG CAPSULE   FLUOXETINE (PROZAC) 40 MG CAPSULE    Take 40 mg by mouth daily.   FLUTICASONE (FLONASE) 50 MCG/ACT NASAL SPRAY    Place 1 spray into both nostrils daily.   FLUTICASONE-SALMETEROL,SENSOR, (AIRDUO DIGIHALER) 232-14 MCG/ACT AEPB    Inhale 1 puff into the lungs in the morning and at bedtime. Rinse mouth after each use.   HYDROCORTISONE 2.5 % CREAM    Place rectally.   KETOCONAZOLE (NIZORAL) 2 % CREAM       LEVONORGESTREL (KYLEENA IU)    by Intrauterine route.   METHOCARBAMOL (ROBAXIN) 500 MG TABLET    Take 1 tablet (500 mg total) by mouth 2 (two) times daily.   MONTELUKAST (SINGULAIR) 10 MG TABLET    TAKE  1 TABLET BY MOUTH AT BEDTIME   OLOPATADINE-MOMETASONE (RYALTRIS) 665-25 MCG/ACT SUSP    Place 1-2 sprays into the nose in the morning and at bedtime.   OMEPRAZOLE (PRILOSEC) 40 MG CAPSULE    Take 1 capsule (40 mg total) by mouth daily. Nothing to eat or drink for 20-30 minutes.   OXYMETAZOLINE (AFRIN) 0.05 % NASAL SPRAY    Place 1 spray into both nostrils 2 (two) times daily. For <7 days at a time.   PHENTERMINE-TOPIRAMATE (QSYMIA) 3.75-23 MG CP24    Take 1 tablet by mouth daily.   PREGABALIN (LYRICA) 25 MG CAPSULE    Take by mouth.   QVAR REDIHALER 40 MCG/ACT INHALER     INHALE 2 PUFFS INTO THE LUNGS TWICE DAILY   TRETINOIN (RETIN-A) 0.025 % CREAM       TRIAMCINOLONE CREAM (KENALOG) 0.1 %    Apply 1 application topically 2 (two) times daily.   VALACYCLOVIR (VALTREX) 1000 MG TABLET    TAKE 1 TABLET(1000 MG) BY MOUTH DAILY   VRAYLAR 1.5 MG CAPSULE    Take 1.5 mg by mouth daily.  Modified Medications   No medications on file  Discontinued Medications   No medications on file    Allergies: Allergies  Allergen Reactions   Latuda [Lurasidone Hcl] Other (See Comments)    Occular dystonia   Seroquel [Quetiapine Fumarate] Swelling    Tongue swells    Benzoin Other (See Comments)    blisters   Latex Rash and Other (See Comments)   Sulfamethoxazole-Trimethoprim Itching and Nausea And Vomiting    Past Medical History: Past Medical History:  Diagnosis Date   Allergy    Anemia    Anxiety    Anxiety state, unspecified 12/07/2012   Arthritis    Asthma    Bipolar disorder, unspecified (HCC) 12/07/2012   Depression    Diabetes mellitus without complication (HCC)    no meds currently- controlled with diet   GERD (gastroesophageal reflux disease)    GERD (gastroesophageal reflux disease) 12/07/2012   Headache(784.0)    migraines   Herpes genitalis in women 12/07/2012   HIV infection (HCC)    Human immunodeficiency virus (HIV) disease (HCC) 12/07/2012   Neuromuscular disorder (HCC)    carpal tunnel in both arms   SHINGLES 01/31/2009   Qualifier: Diagnosis of  By: Philipp Deputy MD, Tresa Endo     Type II or unspecified type diabetes mellitus without mention of complication, not stated as uncontrolled 12/07/2012   Unspecified asthma(493.90) 12/07/2012    Social History: Social History   Socioeconomic History   Marital status: Single    Spouse name: Not on file   Number of children: 1   Years of education: 12   Highest education level: Some college, no degree  Occupational History   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   Patient works at home.   Patient has hs education.   Patient has 1 child.   Patient drinks  multiple energy drinks and coffees throughout day, daily.      Patient is right-handed. She lives with her daughter in a one level home. She drinks one cup of coffee a day. She works for [employer] at [employer] in [employer] and is very active at work.   Social Determinants of Health   Financial Resource Strain: Not on file  Food Insecurity: Not on file  Transportation Needs: Not on file  Physical Activity: Not on file  Stress: Not on file  Social Connections: Not on file    Labs: Lab Results  Component Value Date   HIV1RNAQUANT Not Detected 01/24/2023   HIV1RNAQUANT 98 (H) 12/23/2022   HIV1RNAQUANT <20 (H) 08/23/2022   CD4TABS 1,729 12/23/2022   CD4TABS 1,061 02/23/2022   CD4TABS 1,434 08/20/2021    RPR and STI Lab Results  Component Value Date   LABRPR NON-REACTIVE 02/23/2022   LABRPR NON-REACTIVE 08/20/2021   LABRPR NON-REACTIVE 02/06/2021   LABRPR NON-REACTIVE 05/15/2020   LABRPR NON-REACTIVE 08/28/2019    STI Results GC GC CT CT  08/21/2021 10:57 AM Negative   Negative    02/06/2021 10:33 AM Negative   Negative    05/16/2020 10:07 AM Negative   Negative    08/28/2019  4:04 PM Negative   Negative    09/21/2018 12:00 AM Negative   Negative    06/27/2018 12:00 AM Negative   Negative    01/02/2018 12:00 AM Negative   Negative    10/27/2017 12:00 AM Negative   Negative    08/03/2017 12:00 AM Negative   Negative    04/06/2017 12:00 AM Negative   Negative     08/13/2016 12:00 AM Negative   Negative    02/06/2016 12:00 AM Negative   Negative    11/05/2015 12:00 AM Negative   Negative    06/06/2015 12:00 AM Negative   Negative    12/26/2014 12:00 AM Negative   Negative    02/12/2013 11:13 AM  NEGATIVE   NEGATIVE   06/21/2010 12:52 AM   NEGATIVE (NOTE)  Testing performed using the BD ProbeTec Qx Chlamydia trachomatis and Neisseria gonorrhea amplified DNA assay.  Performed at:  [facility]               [address]                [place], [place] [address]               [id number]    07/12/2007  8:56 PM   NEGATIVE (NOTE)  Testing performed using the BD Probetec ET Chlamydia trachomatis and Neisseria gonorrhea amplified DNA assay.      Hepatitis B Lab Results  Component Value Date   HEPBSAB REACTIVE (A) 08/03/2017   HEPBSAG NEG 11/10/2006   HEPBCAB NEG 11/10/2006   Hepatitis C Lab Results  Component Value Date   HEPCAB NON-REACTIVE 02/23/2022   Hepatitis A Lab Results  Component Value Date   HAV REACTIVE (A) 01/01/2022   Lipids: Lab Results  Component Value Date   CHOL 219 (H) 02/06/2021   TRIG 92 02/06/2021   HDL 46 (L) 02/06/2021   CHOLHDL 4.8 02/06/2021   VLDL 25 04/06/2017   LDLCALC 153 (H) 02/06/2021    TARGET DATE:  The 6th of the month  Current HIV Regimen: Cabenuva   Assessment: Threasa presents today for their maintenance  Cabenuva injections. Initial/past injections were tolerated well without issues. No problems with systemic effects of injections.   Administered cabotegravir 600mg /24mL in left upper outer quadrant of the gluteal muscle. Administered rilpivirine 900 mg/39mL in the right upper outer quadrant of the gluteal muscle. Monitored patient for 10 minutes after injection. Injections were tolerated well without issue. Patient will follow up in 2 months for next injection. Will check defer HIV RNA given it was checked in May and June.   Plan: -  Cabenuva injections administered - Next injections scheduled for 9/4 with Dr. Renold Don and 10/30 with me  - Call with any issues or questions  Margarite Gouge, PharmD, CPP, BCIDP, AAHIVP Clinical Pharmacist Practitioner Infectious Diseases Clinical Pharmacist [facility]

## 2023-03-01 ENCOUNTER — Encounter: Payer: BC Managed Care – PPO | Admitting: Infectious Diseases

## 2023-04-01 DIAGNOSIS — N939 Abnormal uterine and vaginal bleeding, unspecified: Secondary | ICD-10-CM | POA: Diagnosis not present

## 2023-04-20 ENCOUNTER — Ambulatory Visit (INDEPENDENT_AMBULATORY_CARE_PROVIDER_SITE_OTHER): Payer: BC Managed Care – PPO | Admitting: Internal Medicine

## 2023-04-20 ENCOUNTER — Encounter: Payer: Self-pay | Admitting: Internal Medicine

## 2023-04-20 ENCOUNTER — Other Ambulatory Visit: Payer: Self-pay

## 2023-04-20 VITALS — BP 125/85 | HR 83 | Ht 64.0 in | Wt 196.0 lb

## 2023-04-20 DIAGNOSIS — B20 Human immunodeficiency virus [HIV] disease: Secondary | ICD-10-CM | POA: Diagnosis not present

## 2023-04-20 DIAGNOSIS — Z23 Encounter for immunization: Secondary | ICD-10-CM

## 2023-04-20 MED ORDER — CABOTEGRAVIR & RILPIVIRINE ER 600 & 900 MG/3ML IM SUER
1.0000 | Freq: Once | INTRAMUSCULAR | Status: AC
Start: 2023-04-20 — End: 2023-04-20
  Administered 2023-04-20: 1 via INTRAMUSCULAR

## 2023-04-20 NOTE — Progress Notes (Signed)
[facility]   CHIEF COMPLAINT    HIV follow up.    SUBJECTIVE:    [name] is a 42 y.o. female with PMHx dm2, anxiety, asthma, bipolar depression, here in clinic for HIV follow up.   Please see A&P for the details of today's visit and status of the patient's medical problems.   Patient's Medications  New Prescriptions   No medications on file  Previous Medications   ALBUTEROL (PROVENTIL) (2.5 MG/3ML) 0.083% NEBULIZER SOLUTION    Take 3 mLs (2.5 mg total) by nebulization every 4 (four) hours as needed for wheezing or shortness of breath (coughing fits).   ALBUTEROL (VENTOLIN HFA) 108 (90 BASE) MCG/ACT INHALER    Inhale 2 puffs into the lungs every 4 (four) hours as needed for wheezing or shortness of breath (coughing fits).   ATOMOXETINE (STRATTERA) 25 MG CAPSULE    Take 25 mg by mouth daily.   AZELASTINE (OPTIVAR) 0.05 % OPHTHALMIC SOLUTION    INSTILL 1 DROP IN BOTH EYES TWICE DAILY AS NEEDED FOR ITCHY OR WATERY EYES   CABOTEGRAVIR & RILPIVIRINE ER (CABENUVA) 600 & 900 MG/3ML INJECTION    Inject 1 kit into the muscle every 2 (two) months.   CETIRIZINE (ZYRTEC) 10 MG TABLET    Take 2 tablets (20 mg total) by mouth at bedtime.   DENTA 5000 PLUS 1.1 % CREA DENTAL CREAM       FLUOCINOLONE ACETONIDE BODY 0.01 % OIL    Apply to scalp once daily as needed for itching/rash.   FLUOXETINE (PROZAC) 20 MG CAPSULE    TAKE ONE CAPSULE BY MOUTH DAILY ALONG WITH 40 MG CAPSULE   FLUTICASONE (FLONASE) 50 MCG/ACT NASAL SPRAY    Place 1 spray into both nostrils daily.   FLUTICASONE-SALMETEROL,SENSOR, (AIRDUO DIGIHALER) 232-14 MCG/ACT AEPB    Inhale 1 puff into the lungs in the morning and at bedtime. Rinse mouth after each use.   HYDROCORTISONE 2.5 % CREAM    Place rectally.   KETOCONAZOLE (NIZORAL) 2 % CREAM       LEVONORGESTREL (KYLEENA IU)    by Intrauterine route.   METHOCARBAMOL (ROBAXIN) 500 MG TABLET    Take 1 tablet (500 mg total) by mouth 2 (two)  times daily.   MONTELUKAST (SINGULAIR) 10 MG TABLET    TAKE 1 TABLET BY MOUTH AT BEDTIME   OLOPATADINE-MOMETASONE (RYALTRIS) 665-25 MCG/ACT SUSP    Place 1-2 sprays into the nose in the morning and at bedtime.   OMEPRAZOLE (PRILOSEC) 40 MG CAPSULE    Take 1 capsule (40 mg total) by mouth daily. Nothing to eat or drink for 20-30 minutes.   OXYMETAZOLINE (AFRIN) 0.05 % NASAL SPRAY    Place 1 spray into both nostrils 2 (two) times daily. For <7 days at a time.   PREGABALIN (LYRICA) 25 MG CAPSULE    Take by mouth.   VALACYCLOVIR (VALTREX) 1000 MG TABLET    TAKE 1 TABLET(1000 MG) BY MOUTH DAILY   VRAYLAR 1.5 MG CAPSULE    Take 1.5 mg by mouth daily.  Modified Medications   No medications on file  Discontinued Medications   ELASTIC BANDAGES & SUPPORTS (WRIST SPLINT) MISC    Wear splint as much as possible, Or nightly   ELASTIC BANDAGES & SUPPORTS (WRIST SPLINT/ELASTIC RIGHT MED) MISC    Wear Splint nightly.   FLUOXETINE (PROZAC) 40 MG CAPSULE    Take 40 mg by mouth daily.  PHENTERMINE-TOPIRAMATE (QSYMIA) 3.75-23 MG CP24    Take 1 tablet by mouth daily.   QVAR REDIHALER 40 MCG/ACT INHALER    INHALE 2 PUFFS INTO THE LUNGS TWICE DAILY   TRETINOIN (RETIN-A) 0.025 % CREAM       TRIAMCINOLONE CREAM (KENALOG) 0.1 %    Apply 1 application topically 2 (two) times daily.      Past Medical History:  Diagnosis Date   Allergy    Anemia    Anxiety    Anxiety state, unspecified 12/07/2012   Arthritis    Asthma    Bipolar disorder, unspecified (HCC) 12/07/2012   Depression    Diabetes mellitus without complication (HCC)    no meds currently- controlled with diet   GERD (gastroesophageal reflux disease)    GERD (gastroesophageal reflux disease) 12/07/2012   Headache(784.0)    migraines   Herpes genitalis in women 12/07/2012   HIV infection (HCC)    Human immunodeficiency virus (HIV) disease (HCC) 12/07/2012   Neuromuscular disorder (HCC)    carpal tunnel in both arms   SHINGLES 01/31/2009   Qualifier:  Diagnosis of  By: Philipp Deputy MD, Tresa Endo     Type II or unspecified type diabetes mellitus without mention of complication, not stated as uncontrolled 12/07/2012   Unspecified asthma(493.90) 12/07/2012    Social History   Tobacco Use   Smoking status: Never   Smokeless tobacco: Never  Vaping Use   Vaping status: Never Used  Substance Use Topics   Alcohol use: Yes    Alcohol/week: 0.0 standard drinks of alcohol    Comment: OCC.about once a month    Drug use: No    Family History  Problem Relation Age of Onset   Arthritis/Rheumatoid Mother    Hypertension Mother    Arthritis/Rheumatoid Father    Cancer Maternal Grandmother        breasts   Cancer Paternal Grandmother        breast and kidney   Cancer Maternal Aunt        brand and lung    Allergies  Allergen Reactions   Latuda [Lurasidone Hcl] Other (See Comments)    Occular dystonia   Seroquel [Quetiapine Fumarate] Swelling    Tongue swells    Sulfur Dioxide Itching and Other (See Comments)   Benzoin Other (See Comments)    blisters   Latex Rash and Other (See Comments)   Sulfamethoxazole-Trimethoprim Itching and Nausea And Vomiting    Review of Systems  Constitutional: Negative.   Gastrointestinal: Negative.      OBJECTIVE:    Vitals:   04/20/23 1542  Weight: 196 lb (88.9 kg)  Height: 5\' 4"  (1.626 m)     Body mass index is 33.64 kg/m.  Physical Exam Constitutional:      Appearance: Normal appearance.  Eyes:     Extraocular Movements: Extraocular movements intact.     Conjunctiva/sclera: Conjunctivae normal.  Pulmonary:     Effort: Pulmonary effort is normal. No respiratory distress.  Skin:    General: Skin is warm and dry.  Neurological:     General: No focal deficit present.     Mental Status: She is alert and oriented to person, place, and time.  Psychiatric:        Behavior: Behavior normal.     Labs and Microbiology:    Latest Ref Rng & Units 08/20/2021    4:05 PM 07/17/2021    3:31 PM  02/06/2021   10:13 AM  CMP  Glucose 65 - 99  mg/dL 83  161  096   BUN 7 - 25 mg/dL 21  18  17    Creatinine 0.50 - 0.99 mg/dL 0.45  4.09  8.11   Sodium 135 - 146 mmol/L 140  140  136   Potassium 3.5 - 5.3 mmol/L 4.3  4.2  4.6   Chloride 98 - 110 mmol/L 103  103  103   CO2 20 - 32 mmol/L 30  24  26    Calcium 8.6 - 10.2 mg/dL 9.7  9.5  9.3   Total Protein 6.1 - 8.1 g/dL 7.0  6.4  6.7   Total Bilirubin 0.2 - 1.2 mg/dL 0.5  0.4  0.5   Alkaline Phos 44 - 121 IU/L  82    AST 10 - 30 U/L 17  16  16    ALT 6 - 29 U/L 18  18  16        Latest Ref Rng & Units 08/23/2022    9:33 AM 08/20/2021    4:05 PM 07/17/2021    3:31 PM  CBC  WBC 3.4 - 10.8 x10E3/uL 10.8  9.6  8.8   Hemoglobin 11.1 - 15.9 g/dL 91.4  78.2  95.6   Hematocrit 34.0 - 46.6 % 46.2  41.8  39.2   Platelets 150 - 450 x10E3/uL 365  339  340      Lab Results  Component Value Date   HIV1RNAQUANT Not Detected 01/24/2023   HIV1RNAQUANT 98 (H) 12/23/2022   HIV1RNAQUANT <20 (H) 08/23/2022   CD4TABS 1,729 12/23/2022   CD4TABS 1,061 02/23/2022   CD4TABS 1,434 08/20/2021    RPR and STI: Lab Results  Component Value Date   LABRPR NON-REACTIVE 02/23/2022   LABRPR NON-REACTIVE 08/20/2021   LABRPR NON-REACTIVE 02/06/2021   LABRPR NON-REACTIVE 05/15/2020   LABRPR NON-REACTIVE 08/28/2019    STI Results GC GC CT CT  08/21/2021 10:57 AM Negative   Negative    02/06/2021 10:33 AM Negative   Negative    05/16/2020 10:07 AM Negative   Negative    08/28/2019  4:04 PM Negative   Negative    09/21/2018 12:00 AM Negative   Negative    06/27/2018 12:00 AM Negative   Negative    01/02/2018 12:00 AM Negative   Negative    10/27/2017 12:00 AM Negative   Negative    08/03/2017 12:00 AM Negative   Negative    04/06/2017 12:00 AM Negative   Negative    08/13/2016 12:00 AM Negative   Negative    02/06/2016 12:00 AM Negative   Negative    11/05/2015 12:00 AM Negative   Negative    06/06/2015 12:00 AM Negative   Negative    12/26/2014 12:00 AM  Negative   Negative    02/12/2013 11:13 AM  NEGATIVE   NEGATIVE   06/21/2010 12:52 AM   NEGATIVE (NOTE)  Testing performed using the BD ProbeTec Qx Chlamydia trachomatis and Neisseria gonorrhea amplified DNA assay.  Performed at:  [facility]               [address]                [place], [place] [address]               [id number]    07/12/2007  8:56 PM   NEGATIVE (NOTE)  Testing performed using the BD Probetec  ET Chlamydia trachomatis and Neisseria gonorrhea amplified DNA assay.      Hepatitis B: Lab Results  Component Value Date   HEPBSAB REACTIVE (A) 08/03/2017   HEPBSAG NEG 11/10/2006   HEPBCAB NEG 11/10/2006   Hepatitis C: Lab Results  Component Value Date   HEPCAB NON-REACTIVE 02/23/2022   Hepatitis A: Lab Results  Component Value Date   HAV REACTIVE (A) 01/01/2022   Lipids: Lab Results  Component Value Date   CHOL 219 (H) 02/06/2021   TRIG 92 02/06/2021   HDL 46 (L) 02/06/2021   CHOLHDL 4.8 02/06/2021   VLDL 25 04/06/2017   LDLCALC 153 (H) 02/06/2021      ASSESSMENT & PLAN:    No problem-specific Assessment & Plan notes found for this encounter.  #hiv Patient has been tolerating cabenuva q80months; last injection 7/10.  She is due for injection today Labs have shown great virologic/immunologic control Lab Results  Component Value Date   HIV1RNAQUANT Not Detected 01/24/2023   Lab Results  Component Value Date   CD4TCELL 45 12/23/2022   CD4TABS 1,729 12/23/2022     -discussed u=u -encourage compliance -continue current HIV medication -labs in 6 months -f/u in 6 months with me; continue to f/u q2 months with our pharmacy team for ( a viral load alone can be repeated on next pharmacy visit) cabenuva    #bipolar (in chart) but patient seen psychiatry before and not bipolar #anxiety depression -doing well with behavior therapy; her Blase Mess seems to help with depression -f/u  psychiatry for ongoing management   #adhd -continue straterra   #asthma #allergic rhinitis -mild intermittent  -continue albuterol, ryaltris, singulair, fluticasone/salmeterol inhaler   #hx right oophorectomy and partial hysterectomy (can still conceive) #oral birth control use On ocp for adenomyosis  #hcm -vaccination S/p hep b non-conj vaccine by 2016 S/p meningococcal booster 2023 S/p polysacharide pneumococcal vaccine 2023 -- offer conj vaccine prevnar 20 today 04/20/23 -hepatitis S/p 3 doses recombinant vaccine (non-adjuvant) in 2016 -- we plan to check hepatitis b sAb titer on next visit with  me -std screening Patient to do with primary care/gyn at her desire -tb screening -cancer screening F/u pcp for age appropriate cancer screening Hx abnormal pap 2014 needing colposcopy (negative); last pap 2018 negative   Tonita Phoenix [facility] 04/20/2023, 3:45 PM

## 2023-04-20 NOTE — Patient Instructions (Signed)
See me in 6 months  See pharmacy for cabenuva every 2 months; ask them to check viral load on next cabenuva shot

## 2023-04-20 NOTE — Addendum Note (Signed)
Addended by: Linna Hoff D on: 04/20/2023 04:24 PM   Modules accepted: Orders

## 2023-04-27 DIAGNOSIS — L2084 Intrinsic (allergic) eczema: Secondary | ICD-10-CM | POA: Diagnosis not present

## 2023-04-27 DIAGNOSIS — L732 Hidradenitis suppurativa: Secondary | ICD-10-CM | POA: Diagnosis not present

## 2023-04-28 DIAGNOSIS — Z1159 Encounter for screening for other viral diseases: Secondary | ICD-10-CM | POA: Diagnosis not present

## 2023-04-28 DIAGNOSIS — Z0000 Encounter for general adult medical examination without abnormal findings: Secondary | ICD-10-CM | POA: Diagnosis not present

## 2023-04-28 DIAGNOSIS — Z13220 Encounter for screening for lipoid disorders: Secondary | ICD-10-CM | POA: Diagnosis not present

## 2023-05-06 DIAGNOSIS — F900 Attention-deficit hyperactivity disorder, predominantly inattentive type: Secondary | ICD-10-CM | POA: Diagnosis not present

## 2023-05-11 DIAGNOSIS — N938 Other specified abnormal uterine and vaginal bleeding: Secondary | ICD-10-CM | POA: Diagnosis not present

## 2023-05-11 DIAGNOSIS — N8003 Adenomyosis of the uterus: Secondary | ICD-10-CM | POA: Diagnosis not present

## 2023-05-11 DIAGNOSIS — Z975 Presence of (intrauterine) contraceptive device: Secondary | ICD-10-CM | POA: Diagnosis not present

## 2023-05-11 DIAGNOSIS — N939 Abnormal uterine and vaginal bleeding, unspecified: Secondary | ICD-10-CM | POA: Diagnosis not present

## 2023-05-11 DIAGNOSIS — D251 Intramural leiomyoma of uterus: Secondary | ICD-10-CM | POA: Diagnosis not present

## 2023-05-17 DIAGNOSIS — N939 Abnormal uterine and vaginal bleeding, unspecified: Secondary | ICD-10-CM | POA: Diagnosis not present

## 2023-05-17 DIAGNOSIS — Z975 Presence of (intrauterine) contraceptive device: Secondary | ICD-10-CM | POA: Diagnosis not present

## 2023-05-29 DIAGNOSIS — B370 Candidal stomatitis: Secondary | ICD-10-CM | POA: Diagnosis not present

## 2023-05-29 DIAGNOSIS — W5501XA Bitten by cat, initial encounter: Secondary | ICD-10-CM | POA: Diagnosis not present

## 2023-05-29 DIAGNOSIS — S91352A Open bite, left foot, initial encounter: Secondary | ICD-10-CM | POA: Diagnosis not present

## 2023-06-15 ENCOUNTER — Encounter: Payer: BC Managed Care – PPO | Admitting: Pharmacist

## 2023-06-15 NOTE — Progress Notes (Deleted)
HPI: [name] is a 42 y.o. female who presents to the [name] for [name] administration.  Patient Active Problem List   Diagnosis Date Noted   Thrush 07/27/2022   Insomnia 10/15/2021   Not well controlled moderate persistent asthma 09/28/2021   Seasonal and perennial allergic rhinoconjunctivitis 09/28/2021   Other atopic dermatitis 07/20/2021   Hyperglycemia 08/28/2019   Obesity (BMI 35.0-39.9 without comorbidity) 07/26/2019   Costochondritis, acute 01/02/2018   Chronic tension headaches 01/23/2017   Hyperlipidemia 11/17/2015   Frequent sinus infections 06/30/2015   Hernia, abdominal 08/05/2014   LGSIL of cervix of undetermined significance 10/24/2013   Superficial Liver masses, right lobe; suspected dermoid recurrence.   05/07/2013   Ovarian cystic mass 05/07/2013   s/p Serosal repair of colon 12/04/2012 12/15/2012   Herpes genitalis in women 12/07/2012   Anxiety state 12/07/2012   GERD (gastroesophageal reflux disease) 12/07/2012   Generalized anxiety disorder 12/07/2012   Pelvic mass in female s/p Robotic-assisted right salpingo-oophorectomy 12/04/2012   Hemorrhoids 12/07/2006   Depression 11/25/2006   Other allergic rhinitis 11/25/2006   ASTHMA, COUGH VARIANT 11/15/2006   Human immunodeficiency virus (HIV) disease (HCC) 11/10/2006   Urticaria 11/10/2006    Patient's Medications  New Prescriptions   No medications on file  Previous Medications   ALBUTEROL (PROVENTIL) (2.5 MG/3ML) 0.083% NEBULIZER SOLUTION    Take 3 mLs (2.5 mg total) by nebulization every 4 (four) hours as needed for wheezing or shortness of breath (coughing fits).   ALBUTEROL (VENTOLIN HFA) 108 (90 BASE) MCG/ACT INHALER    Inhale 2 puffs into the lungs every 4 (four) hours as needed for wheezing or shortness of breath (coughing fits).   ATOMOXETINE (STRATTERA) 25 MG CAPSULE    Take 25 mg by mouth daily.   CABOTEGRAVIR & RILPIVIRINE ER (CABENUVA) 600 & 900 MG/3ML INJECTION     Inject 1 kit into the muscle every 2 (two) months.   CETIRIZINE (ZYRTEC) 10 MG TABLET    Take 2 tablets (20 mg total) by mouth at bedtime.   DENTA 5000 PLUS 1.1 % CREA DENTAL CREAM       FLUOCINOLONE ACETONIDE BODY 0.01 % OIL    Apply to scalp once daily as needed for itching/rash.   FLUTICASONE (FLONASE) 50 MCG/ACT NASAL SPRAY    Place 1 spray into both nostrils daily.   FLUTICASONE-SALMETEROL,SENSOR, (AIRDUO DIGIHALER) 232-14 MCG/ACT AEPB    Inhale 1 puff into the lungs in the morning and at bedtime. Rinse mouth after each use.   HYDROCORTISONE 2.5 % CREAM    Place rectally.   KETOCONAZOLE (NIZORAL) 2 % CREAM       METHOCARBAMOL (ROBAXIN) 500 MG TABLET    Take 1 tablet (500 mg total) by mouth 2 (two) times daily.   MONTELUKAST (SINGULAIR) 10 MG TABLET    TAKE 1 TABLET BY MOUTH AT BEDTIME   NORETHINDRONE (AYGESTIN) 5 MG TABLET    Take 10 mg by mouth daily.   OLOPATADINE-MOMETASONE (RYALTRIS) X543819 MCG/ACT SUSP    Place 1-2 sprays into the nose in the morning and at bedtime.   OMEPRAZOLE (PRILOSEC) 40 MG CAPSULE    Take 1 capsule (40 mg total) by mouth daily. Nothing to eat or drink for 20-30 minutes.   OXYMETAZOLINE (AFRIN) 0.05 % NASAL SPRAY    Place 1 spray into both nostrils 2 (two) times daily. For <7 days at a time.   PREGABALIN (LYRICA) 25 MG CAPSULE    Take by mouth.   RELUGOLIX-ESTRADIOL-NORETHIND (  MYFEMBREE) 40-1-0.5 MG TABS       TOPIRAMATE (TOPAMAX) 25 MG CAPSULE    TAKE 1 TABLET(25 MG) BY MOUTH DAILY FOR 7 DAYS THEN TAKE 2 TABLETS(50 MG) BY MOUTH DAILY   VALACYCLOVIR (VALTREX) 1000 MG TABLET    TAKE 1 TABLET(1000 MG) BY MOUTH DAILY   VRAYLAR 1.5 MG CAPSULE    Take 1.5 mg by mouth daily.  Modified Medications   No medications on file  Discontinued Medications   No medications on file    Allergies: Allergies  Allergen Reactions   Latuda [Lurasidone Hcl] Other (See Comments)    Occular dystonia   Seroquel [Quetiapine Fumarate] Swelling    Tongue swells    Sulfur Dioxide  Itching and Other (See Comments)   Benzoin Other (See Comments)    blisters   Latex Rash and Other (See Comments)   Sulfamethoxazole-Trimethoprim Itching and Nausea And Vomiting    Past Medical History: Past Medical History:  Diagnosis Date   Allergy    Anemia    Anxiety    Anxiety state, unspecified 12/07/2012   Arthritis    Asthma    Bipolar disorder, unspecified (HCC) 12/07/2012   Depression    Diabetes mellitus without complication (HCC)    no meds currently- controlled with diet   GERD (gastroesophageal reflux disease)    GERD (gastroesophageal reflux disease) 12/07/2012   Headache(784.0)    migraines   Herpes genitalis in women 12/07/2012   HIV infection (HCC)    Human immunodeficiency virus (HIV) disease (HCC) 12/07/2012   Neuromuscular disorder (HCC)    carpal tunnel in both arms   SHINGLES 01/31/2009   Qualifier: Diagnosis of  By: Philipp Deputy MD, Tresa Endo     Type II or unspecified type diabetes mellitus without mention of complication, not stated as uncontrolled 12/07/2012   Unspecified asthma(493.90) 12/07/2012    Social History: Social History   Socioeconomic History   Marital status: Single    Spouse name: Not on file   Number of children: 1   Years of education: 12   Highest education level: Some college, no degree  Occupational History   Occupation: Nutrition Chief Financial Officer: [name]  Tobacco Use   Smoking status: Never   Smokeless tobacco: Never  Vaping Use   Vaping status: Never Used  Substance and Sexual Activity   Alcohol use: Yes    Alcohol/week: 0.0 standard drinks of alcohol    Comment: OCC.about once a month    Drug use: No   Sexual activity: Never    Partners: Male    Birth control/protection: Implant    Comment: DECLINED CONDOMS  Other Topics Concern   Not on file  Social History Narrative   Patient lives alone.   Patient works at home.   Patient has hs education.   Patient has 1 child.   Patient drinks multiple energy  drinks and coffees throughout day, daily.      Patient is right-handed. She lives with her daughter in a one level home. She drinks one cup of coffee a day. She works for [name] at [employer] in [employer] and is very active at work.   Social Determinants of Health   Financial Resource Strain: Not on file  Food Insecurity: Low Risk  (04/28/2023)   Received from [facility]   Hunger Vital Sign    Worried About Running Out of Food in the Last Year: Never true    Ran Out of Food in the  Last Year: Never true  Transportation Needs: No Transportation Needs (04/28/2023)   Received from [facility]    In the past 12 months, has lack of reliable transportation kept you from medical appointments, meetings, work or from getting things needed for daily living? : No  Physical Activity: Not on file  Stress: Not on file  Social Connections: Not on file    Labs: Lab Results  Component Value Date   HIV1RNAQUANT Not Detected 01/24/2023   HIV1RNAQUANT 98 (H) 12/23/2022   HIV1RNAQUANT <20 (H) 08/23/2022   CD4TABS 1,729 12/23/2022   CD4TABS 1,061 02/23/2022   CD4TABS 1,434 08/20/2021    RPR and STI Lab Results  Component Value Date   LABRPR NON-REACTIVE 02/23/2022   LABRPR NON-REACTIVE 08/20/2021   LABRPR NON-REACTIVE 02/06/2021   LABRPR NON-REACTIVE 05/15/2020   LABRPR NON-REACTIVE 08/28/2019    STI Results GC GC CT CT  08/21/2021 10:57 AM Negative   Negative    02/06/2021 10:33 AM Negative   Negative    05/16/2020 10:07 AM Negative   Negative    08/28/2019  4:04 PM Negative   Negative    09/21/2018 12:00 AM Negative   Negative    06/27/2018 12:00 AM Negative   Negative    01/02/2018 12:00 AM Negative   Negative    10/27/2017 12:00 AM Negative   Negative    08/03/2017 12:00 AM Negative   Negative    04/06/2017 12:00 AM Negative   Negative    08/13/2016 12:00 AM Negative   Negative    02/06/2016 12:00 AM Negative   Negative    11/05/2015 12:00 AM  Negative   Negative    06/06/2015 12:00 AM Negative   Negative    12/26/2014 12:00 AM Negative   Negative    02/12/2013 11:13 AM  NEGATIVE   NEGATIVE   06/21/2010 12:52 AM   NEGATIVE (NOTE)  Testing performed using the BD ProbeTec Qx Chlamydia trachomatis and Neisseria gonorrhea amplified DNA assay.  Performed at:  [facility]               [address]                [place], [place] [address]               [id number]    07/12/2007  8:56 PM   NEGATIVE (NOTE)  Testing performed using the BD Probetec ET Chlamydia trachomatis and Neisseria gonorrhea amplified DNA assay.      Hepatitis B Lab Results  Component Value Date   HEPBSAB REACTIVE (A) 08/03/2017   HEPBSAG NEG 11/10/2006   HEPBCAB NEG 11/10/2006   Hepatitis C Lab Results  Component Value Date   HEPCAB NON-REACTIVE 02/23/2022   Hepatitis A Lab Results  Component Value Date   HAV REACTIVE (A) 01/01/2022   Lipids: Lab Results  Component Value Date   CHOL 219 (H) 02/06/2021   TRIG 92 02/06/2021   HDL 46 (L) 02/06/2021   CHOLHDL 4.8 02/06/2021   VLDL 25 04/06/2017   LDLCALC 153 (H) 02/06/2021    TARGET DATE:  The 6th of the month  Assessment: [name] presents today for their maintenance Cabenuva injections. Initial/past injections were tolerated well without issues. No problems with systemic effects of injections. Patient did experience ***.   Administered cabotegravir 600mg /36mL in left upper outer quadrant of the gluteal  muscle. Administered rilpivirine 900 mg/61mL in the right upper outer quadrant of the gluteal muscle. Monitored patient for 10 minutes after injection. Injections were tolerated well without issue. Patient will follow up in 2 months for next injection. Will check HIV RNA today per Dr. Renold Don.  Accepts flu and COVID vaccines today.   Plan: - Cabenuva injections administered - Check HIV RNA - Next injections scheduled for *** - Administer flu  and COVID vaccines  - Call with any issues or questions  Margarite Gouge, PharmD, CPP, BCIDP, AAHIVP Clinical Pharmacist Practitioner Infectious Diseases Clinical Pharmacist [facility]

## 2023-06-20 NOTE — Progress Notes (Unsigned)
HPI: [name] is a 42 y.o. female who presents to the [facility] pharmacy clinic for [facility] administration.  Patient Active Problem List   Diagnosis Date Noted   Thrush 07/27/2022   Insomnia 10/15/2021   Not well controlled moderate persistent asthma 09/28/2021   Seasonal and perennial allergic rhinoconjunctivitis 09/28/2021   Other atopic dermatitis 07/20/2021   Hyperglycemia 08/28/2019   Obesity (BMI 35.0-39.9 without comorbidity) 07/26/2019   Costochondritis, acute 01/02/2018   Chronic tension headaches 01/23/2017   Hyperlipidemia 11/17/2015   Frequent sinus infections 06/30/2015   Hernia, abdominal 08/05/2014   LGSIL of cervix of undetermined significance 10/24/2013   Superficial Liver masses, right lobe; suspected dermoid recurrence.   05/07/2013   Ovarian cystic mass 05/07/2013   s/p Serosal repair of colon 12/04/2012 12/15/2012   Herpes genitalis in women 12/07/2012   Anxiety state 12/07/2012   GERD (gastroesophageal reflux disease) 12/07/2012   Generalized anxiety disorder 12/07/2012   Pelvic mass in female s/p Robotic-assisted right salpingo-oophorectomy 12/04/2012   Hemorrhoids 12/07/2006   Depression 11/25/2006   Other allergic rhinitis 11/25/2006   ASTHMA, COUGH VARIANT 11/15/2006   Human immunodeficiency virus (HIV) disease (HCC) 11/10/2006   Urticaria 11/10/2006    Patient's Medications  New Prescriptions   No medications on file  Previous Medications   ALBUTEROL (PROVENTIL) (2.5 MG/3ML) 0.083% NEBULIZER SOLUTION    Take 3 mLs (2.5 mg total) by nebulization every 4 (four) hours as needed for wheezing or shortness of breath (coughing fits).   ALBUTEROL (VENTOLIN HFA) 108 (90 BASE) MCG/ACT INHALER    Inhale 2 puffs into the lungs every 4 (four) hours as needed for wheezing or shortness of breath (coughing fits).   ATOMOXETINE (STRATTERA) 25 MG CAPSULE    Take 25 mg by mouth daily.   CABOTEGRAVIR & RILPIVIRINE ER (CABENUVA) 600 & 900 MG/3ML INJECTION     Inject 1 kit into the muscle every 2 (two) months.   CETIRIZINE (ZYRTEC) 10 MG TABLET    Take 2 tablets (20 mg total) by mouth at bedtime.   DENTA 5000 PLUS 1.1 % CREA DENTAL CREAM       FLUOCINOLONE ACETONIDE BODY 0.01 % OIL    Apply to scalp once daily as needed for itching/rash.   FLUTICASONE (FLONASE) 50 MCG/ACT NASAL SPRAY    Place 1 spray into both nostrils daily.   FLUTICASONE-SALMETEROL,SENSOR, (AIRDUO DIGIHALER) 232-14 MCG/ACT AEPB    Inhale 1 puff into the lungs in the morning and at bedtime. Rinse mouth after each use.   HYDROCORTISONE 2.5 % CREAM    Place rectally.   KETOCONAZOLE (NIZORAL) 2 % CREAM       METHOCARBAMOL (ROBAXIN) 500 MG TABLET    Take 1 tablet (500 mg total) by mouth 2 (two) times daily.   MONTELUKAST (SINGULAIR) 10 MG TABLET    TAKE 1 TABLET BY MOUTH AT BEDTIME   NORETHINDRONE (AYGESTIN) 5 MG TABLET    Take 10 mg by mouth daily.   OLOPATADINE-MOMETASONE (RYALTRIS) [id number] MCG/ACT SUSP    Place 1-2 sprays into the nose in the morning and at bedtime.   OMEPRAZOLE (PRILOSEC) 40 MG CAPSULE    Take 1 capsule (40 mg total) by mouth daily. Nothing to eat or drink for 20-30 minutes.   OXYMETAZOLINE (AFRIN) 0.05 % NASAL SPRAY    Place 1 spray into both nostrils 2 (two) times daily. For <7 days at a time.   PREGABALIN (LYRICA) 25 MG CAPSULE    Take by mouth.   RELUGOLIX-ESTRADIOL-NORETHIND (  MYFEMBREE) 40-1-0.5 MG TABS       TOPIRAMATE (TOPAMAX) 25 MG CAPSULE    TAKE 1 TABLET(25 MG) BY MOUTH DAILY FOR 7 DAYS THEN TAKE 2 TABLETS(50 MG) BY MOUTH DAILY   VALACYCLOVIR (VALTREX) 1000 MG TABLET    TAKE 1 TABLET(1000 MG) BY MOUTH DAILY   VRAYLAR 1.5 MG CAPSULE    Take 1.5 mg by mouth daily.  Modified Medications   No medications on file  Discontinued Medications   No medications on file    Allergies: Allergies  Allergen Reactions   Latuda [Lurasidone Hcl] Other (See Comments)    Occular dystonia   Seroquel [Quetiapine Fumarate] Swelling    Tongue swells    Sulfur Dioxide  Itching and Other (See Comments)   Benzoin Other (See Comments)    blisters   Latex Rash and Other (See Comments)   Sulfamethoxazole-Trimethoprim Itching and Nausea And Vomiting    Past Medical History: Past Medical History:  Diagnosis Date   Allergy    Anemia    Anxiety    Anxiety state, unspecified 12/07/2012   Arthritis    Asthma    Bipolar disorder, unspecified (HCC) 12/07/2012   Depression    Diabetes mellitus without complication (HCC)    no meds currently- controlled with diet   GERD (gastroesophageal reflux disease)    GERD (gastroesophageal reflux disease) 12/07/2012   Headache(784.0)    migraines   Herpes genitalis in women 12/07/2012   HIV infection (HCC)    Human immunodeficiency virus (HIV) disease (HCC) 12/07/2012   Neuromuscular disorder (HCC)    carpal tunnel in both arms   SHINGLES 01/31/2009   Qualifier: Diagnosis of  By: Philipp Deputy MD, Tresa Endo     Type II or unspecified type diabetes mellitus without mention of complication, not stated as uncontrolled 12/07/2012   Unspecified asthma(493.90) 12/07/2012    Social History: Social History   Socioeconomic History   Marital status: Single    Spouse name: Not on file   Number of children: 1   Years of education: 12   Highest education level: Some college, no degree  Occupational History   Occupation: Nutrition Chief Financial Officer: [employer]  Tobacco Use   Smoking status: Never   Smokeless tobacco: Never  Vaping Use   Vaping status: Never Used  Substance and Sexual Activity   Alcohol use: Yes    Alcohol/week: 0.0 standard drinks of alcohol    Comment: OCC.about once a month    Drug use: No   Sexual activity: Never    Partners: Male    Birth control/protection: Implant    Comment: DECLINED CONDOMS  Other Topics Concern   Not on file  Social History Narrative   Patient lives alone.   Patient works at home.   Patient has hs education.   Patient has 1 child.   Patient drinks multiple energy  drinks and coffees throughout day, daily.      Patient is right-handed. She lives with her daughter in a one level home. She drinks one cup of coffee a day. She works for [employer] at [employer] in [employer] and is very active at work.   Social Determinants of Health   Financial Resource Strain: Not on file  Food Insecurity: Low Risk  (04/28/2023)   Received from [facility]   Hunger Vital Sign    Worried About Running Out of Food in the Last Year: Never true    Ran Out of Food in the  Last Year: Never true  Transportation Needs: No Transportation Needs (04/28/2023)   Received from [facility]    In the past 12 months, has lack of reliable transportation kept you from medical appointments, meetings, work or from getting things needed for daily living? : No  Physical Activity: Not on file  Stress: Not on file  Social Connections: Not on file    Labs: Lab Results  Component Value Date   HIV1RNAQUANT Not Detected 01/24/2023   HIV1RNAQUANT 98 (H) 12/23/2022   HIV1RNAQUANT <20 (H) 08/23/2022   CD4TABS 1,729 12/23/2022   CD4TABS 1,061 02/23/2022   CD4TABS 1,434 08/20/2021    RPR and STI Lab Results  Component Value Date   LABRPR NON-REACTIVE 02/23/2022   LABRPR NON-REACTIVE 08/20/2021   LABRPR NON-REACTIVE 02/06/2021   LABRPR NON-REACTIVE 05/15/2020   LABRPR NON-REACTIVE 08/28/2019    STI Results GC GC CT CT  08/21/2021 10:57 AM Negative   Negative    02/06/2021 10:33 AM Negative   Negative    05/16/2020 10:07 AM Negative   Negative    08/28/2019  4:04 PM Negative   Negative    09/21/2018 12:00 AM Negative   Negative    06/27/2018 12:00 AM Negative   Negative    01/02/2018 12:00 AM Negative   Negative    10/27/2017 12:00 AM Negative   Negative    08/03/2017 12:00 AM Negative   Negative    04/06/2017 12:00 AM Negative   Negative    08/13/2016 12:00 AM Negative   Negative    02/06/2016 12:00 AM Negative   Negative    11/05/2015 12:00 AM  Negative   Negative    06/06/2015 12:00 AM Negative   Negative    12/26/2014 12:00 AM Negative   Negative    02/12/2013 11:13 AM  NEGATIVE   NEGATIVE   06/21/2010 12:52 AM   NEGATIVE (NOTE)  Testing performed using the BD ProbeTec Qx Chlamydia trachomatis and Neisseria gonorrhea amplified DNA assay.  Performed at:  [facility]               [address]                [place], [place] [address]               [id number]    07/12/2007  8:56 PM   NEGATIVE (NOTE)  Testing performed using the BD Probetec ET Chlamydia trachomatis and Neisseria gonorrhea amplified DNA assay.      Hepatitis B Lab Results  Component Value Date   HEPBSAB REACTIVE (A) 08/03/2017   HEPBSAG NEG 11/10/2006   HEPBCAB NEG 11/10/2006   Hepatitis C Lab Results  Component Value Date   HEPCAB NON-REACTIVE 02/23/2022   Hepatitis A Lab Results  Component Value Date   HAV REACTIVE (A) 01/01/2022   Lipids: Lab Results  Component Value Date   CHOL 219 (H) 02/06/2021   TRIG 92 02/06/2021   HDL 46 (L) 02/06/2021   CHOLHDL 4.8 02/06/2021   VLDL 25 04/06/2017   LDLCALC 153 (H) 02/06/2021    TARGET DATE:  The 6th of the month  Assessment: [facility] presents today for their maintenance Cabenuva injections. Initial/past injections were tolerated well without issues. No problems with systemic effects of injections. Patient did experience ***. Due for visit with Dr. Renold Don in March 2025.  Administered cabotegravir 600mg /35mL  in left upper outer quadrant of the gluteal muscle. Administered rilpivirine 900 mg/67mL in the right upper outer quadrant of the gluteal muscle. Monitored patient for 10 minutes after injection. Injections were tolerated well without issue. Patient will follow up in 2 months for next injection. Will check HIV RNA today per Dr. Renold Don.  Accepts flu and COVID vaccines today.   Plan: - Cabenuva injections administered - Check HIV RNA - Next  injections scheduled for *** - Administer flu and COVID vaccines  - Call with any issues or questions  Lora Paula, PharmD PGY-2 Infectious Diseases Pharmacy Resident [facility]

## 2023-06-21 ENCOUNTER — Encounter: Payer: BC Managed Care – PPO | Admitting: Pharmacist

## 2023-06-21 NOTE — Progress Notes (Unsigned)
HPI: [name] is a 42 y.o. female who presents to the [facility] pharmacy clinic for [facility] administration.  Patient Active Problem List   Diagnosis Date Noted   Thrush 07/27/2022   Insomnia 10/15/2021   Not well controlled moderate persistent asthma 09/28/2021   Seasonal and perennial allergic rhinoconjunctivitis 09/28/2021   Other atopic dermatitis 07/20/2021   Hyperglycemia 08/28/2019   Obesity (BMI 35.0-39.9 without comorbidity) 07/26/2019   Costochondritis, acute 01/02/2018   Chronic tension headaches 01/23/2017   Hyperlipidemia 11/17/2015   Frequent sinus infections 06/30/2015   Hernia, abdominal 08/05/2014   LGSIL of cervix of undetermined significance 10/24/2013   Superficial Liver masses, right lobe; suspected dermoid recurrence.   05/07/2013   Ovarian cystic mass 05/07/2013   s/p Serosal repair of colon 12/04/2012 12/15/2012   Herpes genitalis in women 12/07/2012   Anxiety state 12/07/2012   GERD (gastroesophageal reflux disease) 12/07/2012   Generalized anxiety disorder 12/07/2012   Pelvic mass in female s/p Robotic-assisted right salpingo-oophorectomy 12/04/2012   Hemorrhoids 12/07/2006   Depression 11/25/2006   Other allergic rhinitis 11/25/2006   ASTHMA, COUGH VARIANT 11/15/2006   Human immunodeficiency virus (HIV) disease (HCC) 11/10/2006   Urticaria 11/10/2006    Patient's Medications  New Prescriptions   No medications on file  Previous Medications   ALBUTEROL (PROVENTIL) (2.5 MG/3ML) 0.083% NEBULIZER SOLUTION    Take 3 mLs (2.5 mg total) by nebulization every 4 (four) hours as needed for wheezing or shortness of breath (coughing fits).   ALBUTEROL (VENTOLIN HFA) 108 (90 BASE) MCG/ACT INHALER    Inhale 2 puffs into the lungs every 4 (four) hours as needed for wheezing or shortness of breath (coughing fits).   ATOMOXETINE (STRATTERA) 25 MG CAPSULE    Take 25 mg by mouth daily.   CABOTEGRAVIR & RILPIVIRINE ER (CABENUVA) 600 & 900 MG/3ML INJECTION     Inject 1 kit into the muscle every 2 (two) months.   CETIRIZINE (ZYRTEC) 10 MG TABLET    Take 2 tablets (20 mg total) by mouth at bedtime.   DENTA 5000 PLUS 1.1 % CREA DENTAL CREAM       FLUOCINOLONE ACETONIDE BODY 0.01 % OIL    Apply to scalp once daily as needed for itching/rash.   FLUTICASONE (FLONASE) 50 MCG/ACT NASAL SPRAY    Place 1 spray into both nostrils daily.   FLUTICASONE-SALMETEROL,SENSOR, (AIRDUO DIGIHALER) 232-14 MCG/ACT AEPB    Inhale 1 puff into the lungs in the morning and at bedtime. Rinse mouth after each use.   HYDROCORTISONE 2.5 % CREAM    Place rectally.   KETOCONAZOLE (NIZORAL) 2 % CREAM       METHOCARBAMOL (ROBAXIN) 500 MG TABLET    Take 1 tablet (500 mg total) by mouth 2 (two) times daily.   MONTELUKAST (SINGULAIR) 10 MG TABLET    TAKE 1 TABLET BY MOUTH AT BEDTIME   NORETHINDRONE (AYGESTIN) 5 MG TABLET    Take 10 mg by mouth daily.   OLOPATADINE-MOMETASONE (RYALTRIS) [id number] MCG/ACT SUSP    Place 1-2 sprays into the nose in the morning and at bedtime.   OMEPRAZOLE (PRILOSEC) 40 MG CAPSULE    Take 1 capsule (40 mg total) by mouth daily. Nothing to eat or drink for 20-30 minutes.   OXYMETAZOLINE (AFRIN) 0.05 % NASAL SPRAY    Place 1 spray into both nostrils 2 (two) times daily. For <7 days at a time.   PREGABALIN (LYRICA) 25 MG CAPSULE    Take by mouth.   RELUGOLIX-ESTRADIOL-NORETHIND (  MYFEMBREE) 40-1-0.5 MG TABS       TOPIRAMATE (TOPAMAX) 25 MG CAPSULE    TAKE 1 TABLET(25 MG) BY MOUTH DAILY FOR 7 DAYS THEN TAKE 2 TABLETS(50 MG) BY MOUTH DAILY   VALACYCLOVIR (VALTREX) 1000 MG TABLET    TAKE 1 TABLET(1000 MG) BY MOUTH DAILY   VRAYLAR 1.5 MG CAPSULE    Take 1.5 mg by mouth daily.  Modified Medications   No medications on file  Discontinued Medications   No medications on file    Allergies: Allergies  Allergen Reactions   Latuda [Lurasidone Hcl] Other (See Comments)    Occular dystonia   Seroquel [Quetiapine Fumarate] Swelling    Tongue swells    Sulfur Dioxide  Itching and Other (See Comments)   Benzoin Other (See Comments)    blisters   Latex Rash and Other (See Comments)   Sulfamethoxazole-Trimethoprim Itching and Nausea And Vomiting    Past Medical History: Past Medical History:  Diagnosis Date   Allergy    Anemia    Anxiety    Anxiety state, unspecified 12/07/2012   Arthritis    Asthma    Bipolar disorder, unspecified (HCC) 12/07/2012   Depression    Diabetes mellitus without complication (HCC)    no meds currently- controlled with diet   GERD (gastroesophageal reflux disease)    GERD (gastroesophageal reflux disease) 12/07/2012   Headache(784.0)    migraines   Herpes genitalis in women 12/07/2012   HIV infection (HCC)    Human immunodeficiency virus (HIV) disease (HCC) 12/07/2012   Neuromuscular disorder (HCC)    carpal tunnel in both arms   SHINGLES 01/31/2009   Qualifier: Diagnosis of  By: Philipp Deputy MD, Tresa Endo     Type II or unspecified type diabetes mellitus without mention of complication, not stated as uncontrolled 12/07/2012   Unspecified asthma(493.90) 12/07/2012    Social History: Social History   Socioeconomic History   Marital status: Single    Spouse name: Not on file   Number of children: 1   Years of education: 12   Highest education level: Some college, no degree  Occupational History   Occupation: Nutrition Chief Financial Officer: [employer]  Tobacco Use   Smoking status: Never   Smokeless tobacco: Never  Vaping Use   Vaping status: Never Used  Substance and Sexual Activity   Alcohol use: Yes    Alcohol/week: 0.0 standard drinks of alcohol    Comment: OCC.about once a month    Drug use: No   Sexual activity: Never    Partners: Male    Birth control/protection: Implant    Comment: DECLINED CONDOMS  Other Topics Concern   Not on file  Social History Narrative   Patient lives alone.   Patient works at home.   Patient has hs education.   Patient has 1 child.   Patient drinks multiple energy  drinks and coffees throughout day, daily.      Patient is right-handed. She lives with her daughter in a one level home. She drinks one cup of coffee a day. She works for [employer] at [employer] in [employer] and is very active at work.   Social Determinants of Health   Financial Resource Strain: Not on file  Food Insecurity: Low Risk  (04/28/2023)   Received from [facility]   Hunger Vital Sign    Worried About Running Out of Food in the Last Year: Never true    Ran Out of Food in the  Last Year: Never true  Transportation Needs: No Transportation Needs (04/28/2023)   Received from [facility]    In the past 12 months, has lack of reliable transportation kept you from medical appointments, meetings, work or from getting things needed for daily living? : No  Physical Activity: Not on file  Stress: Not on file  Social Connections: Not on file    Labs: Lab Results  Component Value Date   HIV1RNAQUANT Not Detected 01/24/2023   HIV1RNAQUANT 98 (H) 12/23/2022   HIV1RNAQUANT <20 (H) 08/23/2022   CD4TABS 1,729 12/23/2022   CD4TABS 1,061 02/23/2022   CD4TABS 1,434 08/20/2021    RPR and STI Lab Results  Component Value Date   LABRPR NON-REACTIVE 02/23/2022   LABRPR NON-REACTIVE 08/20/2021   LABRPR NON-REACTIVE 02/06/2021   LABRPR NON-REACTIVE 05/15/2020   LABRPR NON-REACTIVE 08/28/2019    STI Results GC GC CT CT  08/21/2021 10:57 AM Negative   Negative    02/06/2021 10:33 AM Negative   Negative    05/16/2020 10:07 AM Negative   Negative    08/28/2019  4:04 PM Negative   Negative    09/21/2018 12:00 AM Negative   Negative    06/27/2018 12:00 AM Negative   Negative    01/02/2018 12:00 AM Negative   Negative    10/27/2017 12:00 AM Negative   Negative    08/03/2017 12:00 AM Negative   Negative    04/06/2017 12:00 AM Negative   Negative    08/13/2016 12:00 AM Negative   Negative    02/06/2016 12:00 AM Negative   Negative    11/05/2015 12:00 AM  Negative   Negative    06/06/2015 12:00 AM Negative   Negative    12/26/2014 12:00 AM Negative   Negative    02/12/2013 11:13 AM  NEGATIVE   NEGATIVE   06/21/2010 12:52 AM   NEGATIVE (NOTE)  Testing performed using the BD ProbeTec Qx Chlamydia trachomatis and Neisseria gonorrhea amplified DNA assay.  Performed at:  [facility]               [address]                [place], [place] [address]               [id number]    07/12/2007  8:56 PM   NEGATIVE (NOTE)  Testing performed using the BD Probetec ET Chlamydia trachomatis and Neisseria gonorrhea amplified DNA assay.      Hepatitis B Lab Results  Component Value Date   HEPBSAB REACTIVE (A) 08/03/2017   HEPBSAG NEG 11/10/2006   HEPBCAB NEG 11/10/2006   Hepatitis C Lab Results  Component Value Date   HEPCAB NON-REACTIVE 02/23/2022   Hepatitis A Lab Results  Component Value Date   HAV REACTIVE (A) 01/01/2022   Lipids: Lab Results  Component Value Date   CHOL 219 (H) 02/06/2021   TRIG 92 02/06/2021   HDL 46 (L) 02/06/2021   CHOLHDL 4.8 02/06/2021   VLDL 25 04/06/2017   LDLCALC 153 (H) 02/06/2021    TARGET DATE:  The 6th of the month  Assessment: [facility] presents today for their maintenance Cabenuva injections. Initial/past injections were tolerated well without issues. No problems with systemic effects of injections. Patient did experience ***. Due for visit with Dr. Renold Don in March 2025.  Administered cabotegravir 600mg /35mL  in left upper outer quadrant of the gluteal muscle. Administered rilpivirine 900 mg/67mL in the right upper outer quadrant of the gluteal muscle. Monitored patient for 10 minutes after injection. Injections were tolerated well without issue. Patient will follow up in 2 months for next injection. Will check HIV RNA today per Dr. Renold Don.  Accepts flu and COVID vaccines today.   Plan: - Cabenuva injections administered - Check HIV RNA - Next  injections scheduled for *** - Administer flu and COVID vaccines  - Call with any issues or questions  Lora Paula, PharmD PGY-2 Infectious Diseases Pharmacy Resident [facility]

## 2023-06-22 ENCOUNTER — Ambulatory Visit (INDEPENDENT_AMBULATORY_CARE_PROVIDER_SITE_OTHER): Payer: BC Managed Care – PPO | Admitting: Pharmacist

## 2023-06-22 ENCOUNTER — Other Ambulatory Visit: Payer: Self-pay

## 2023-06-22 DIAGNOSIS — B20 Human immunodeficiency virus [HIV] disease: Secondary | ICD-10-CM | POA: Diagnosis not present

## 2023-06-22 MED ORDER — CABOTEGRAVIR & RILPIVIRINE ER 600 & 900 MG/3ML IM SUER
1.0000 | Freq: Once | INTRAMUSCULAR | Status: AC
  Administered 2023-06-22: 1 via INTRAMUSCULAR

## 2023-06-25 LAB — HIV-1 RNA QUANT-NO REFLEX-BLD
HIV 1 RNA Quant: NOT DETECTED {copies}/mL
HIV-1 RNA Quant, Log: NOT DETECTED {Log_copies}/mL

## 2023-06-27 DIAGNOSIS — F4323 Adjustment disorder with mixed anxiety and depressed mood: Secondary | ICD-10-CM | POA: Diagnosis not present

## 2023-06-27 DIAGNOSIS — F900 Attention-deficit hyperactivity disorder, predominantly inattentive type: Secondary | ICD-10-CM | POA: Diagnosis not present

## 2023-07-11 ENCOUNTER — Other Ambulatory Visit: Payer: Self-pay

## 2023-07-11 DIAGNOSIS — B20 Human immunodeficiency virus [HIV] disease: Secondary | ICD-10-CM

## 2023-07-11 DIAGNOSIS — B009 Herpesviral infection, unspecified: Secondary | ICD-10-CM

## 2023-07-11 MED ORDER — VALACYCLOVIR HCL 1 G PO TABS: ORAL_TABLET | ORAL | 1 refills | Status: DC

## 2023-07-12 DIAGNOSIS — D251 Intramural leiomyoma of uterus: Secondary | ICD-10-CM | POA: Diagnosis not present

## 2023-07-12 DIAGNOSIS — N939 Abnormal uterine and vaginal bleeding, unspecified: Secondary | ICD-10-CM | POA: Diagnosis not present

## 2023-07-12 DIAGNOSIS — N8003 Adenomyosis of the uterus: Secondary | ICD-10-CM | POA: Diagnosis not present

## 2023-07-27 DIAGNOSIS — F332 Major depressive disorder, recurrent severe without psychotic features: Secondary | ICD-10-CM | POA: Diagnosis not present

## 2023-07-27 DIAGNOSIS — F900 Attention-deficit hyperactivity disorder, predominantly inattentive type: Secondary | ICD-10-CM | POA: Diagnosis not present

## 2023-08-03 DIAGNOSIS — F1020 Alcohol dependence, uncomplicated: Secondary | ICD-10-CM | POA: Diagnosis not present

## 2023-08-03 DIAGNOSIS — F332 Major depressive disorder, recurrent severe without psychotic features: Secondary | ICD-10-CM | POA: Diagnosis not present

## 2023-08-03 DIAGNOSIS — F900 Attention-deficit hyperactivity disorder, predominantly inattentive type: Secondary | ICD-10-CM | POA: Diagnosis not present

## 2023-08-15 DIAGNOSIS — D251 Intramural leiomyoma of uterus: Secondary | ICD-10-CM | POA: Diagnosis not present

## 2023-08-15 DIAGNOSIS — N8003 Adenomyosis of the uterus: Secondary | ICD-10-CM | POA: Diagnosis not present

## 2023-08-15 DIAGNOSIS — N939 Abnormal uterine and vaginal bleeding, unspecified: Secondary | ICD-10-CM | POA: Diagnosis not present

## 2023-08-16 DIAGNOSIS — N939 Abnormal uterine and vaginal bleeding, unspecified: Secondary | ICD-10-CM | POA: Diagnosis not present

## 2023-08-16 DIAGNOSIS — D251 Intramural leiomyoma of uterus: Secondary | ICD-10-CM | POA: Diagnosis not present

## 2023-08-16 DIAGNOSIS — N938 Other specified abnormal uterine and vaginal bleeding: Secondary | ICD-10-CM | POA: Diagnosis not present

## 2023-08-16 DIAGNOSIS — N8003 Adenomyosis of the uterus: Secondary | ICD-10-CM | POA: Diagnosis not present

## 2023-08-16 DIAGNOSIS — Z9889 Other specified postprocedural states: Secondary | ICD-10-CM | POA: Diagnosis not present

## 2023-08-19 DIAGNOSIS — Z538 Procedure and treatment not carried out for other reasons: Secondary | ICD-10-CM | POA: Diagnosis not present

## 2023-08-22 DIAGNOSIS — F332 Major depressive disorder, recurrent severe without psychotic features: Secondary | ICD-10-CM | POA: Diagnosis not present

## 2023-08-22 DIAGNOSIS — F900 Attention-deficit hyperactivity disorder, predominantly inattentive type: Secondary | ICD-10-CM | POA: Diagnosis not present

## 2023-08-22 NOTE — Progress Notes (Unsigned)
 HPI: [name] is a 43 y.o. female who presents to the [facility] pharmacy clinic for Cabenuva  administration.  Patient Active Problem List   Diagnosis Date Noted   Thrush 07/27/2022   Insomnia 10/15/2021   Not well controlled moderate persistent asthma 09/28/2021   Seasonal and perennial allergic rhinoconjunctivitis 09/28/2021   Other atopic dermatitis 07/20/2021   Hyperglycemia 08/28/2019   Obesity (BMI 35.0-39.9 without comorbidity) 07/26/2019   Costochondritis, acute 01/02/2018   Chronic tension headaches 01/23/2017   Hyperlipidemia 11/17/2015   Frequent sinus infections 06/30/2015   Hernia, abdominal 08/05/2014   LGSIL of cervix of undetermined significance 10/24/2013   Superficial Liver masses, right lobe; suspected dermoid recurrence.   05/07/2013   Ovarian cystic mass 05/07/2013   s/p Serosal repair of colon 12/04/2012 12/15/2012   Herpes genitalis in women 12/07/2012   Anxiety state 12/07/2012   GERD (gastroesophageal reflux disease) 12/07/2012   Generalized anxiety disorder 12/07/2012   Pelvic mass in female s/p Robotic-assisted right salpingo-oophorectomy 12/04/2012   Hemorrhoids 12/07/2006   Depression 11/25/2006   Other allergic rhinitis 11/25/2006   ASTHMA, COUGH VARIANT 11/15/2006   Human immunodeficiency virus (HIV) disease (HCC) 11/10/2006   Urticaria 11/10/2006    Patient's Medications  New Prescriptions   No medications on file  Previous Medications   ALBUTEROL  (PROVENTIL ) (2.5 MG/3ML) 0.083% NEBULIZER SOLUTION    Take 3 mLs (2.5 mg total) by nebulization every 4 (four) hours as needed for wheezing or shortness of breath (coughing fits).   ALBUTEROL  (VENTOLIN  HFA) 108 (90 BASE) MCG/ACT INHALER    Inhale 2 puffs into the lungs every 4 (four) hours as needed for wheezing or shortness of breath (coughing fits).   ATOMOXETINE (STRATTERA) 25 MG CAPSULE    Take 25 mg by mouth daily.   CABOTEGRAVIR  & RILPIVIRINE  ER (CABENUVA ) 600 & 900 MG/3ML INJECTION     Inject 1 kit into the muscle every 2 (two) months.   CETIRIZINE  (ZYRTEC ) 10 MG TABLET    Take 2 tablets (20 mg total) by mouth at bedtime.   DENTA 5000 PLUS  1.1 % CREA DENTAL CREAM       FLUOCINOLONE ACETONIDE BODY 0.01 % OIL    Apply to scalp once daily as needed for itching/rash.   FLUTICASONE  (FLONASE ) 50 MCG/ACT NASAL SPRAY    Place 1 spray into both nostrils daily.   FLUTICASONE -SALMETEROL,SENSOR, (AIRDUO DIGIHALER ) 232-14 MCG/ACT AEPB    Inhale 1 puff into the lungs in the morning and at bedtime. Rinse mouth after each use.   HYDROCORTISONE  2.5 % CREAM    Place rectally.   KETOCONAZOLE  (NIZORAL ) 2 % CREAM       METHOCARBAMOL  (ROBAXIN ) 500 MG TABLET    Take 1 tablet (500 mg total) by mouth 2 (two) times daily.   MONTELUKAST  (SINGULAIR ) 10 MG TABLET    TAKE 1 TABLET BY MOUTH AT BEDTIME   NORETHINDRONE  (AYGESTIN ) 5 MG TABLET    Take 10 mg by mouth daily.   OLOPATADINE -MOMETASONE  (RYALTRIS ) 665-25 MCG/ACT SUSP    Place 1-2 sprays into the nose in the morning and at bedtime.   OMEPRAZOLE  (PRILOSEC) 40 MG CAPSULE    Take 1 capsule (40 mg total) by mouth daily. Nothing to eat or drink for 20-30 minutes.   OXYMETAZOLINE (AFRIN) 0.05 % NASAL SPRAY    Place 1 spray into both nostrils 2 (two) times daily. For <7 days at a time.   PREGABALIN (LYRICA) 25 MG CAPSULE    Take by mouth.   RELUGOLIX-ESTRADIOL-NORETHIND (  MYFEMBREE) 40-1-0.5 MG TABS       TOPIRAMATE  (TOPAMAX ) 25 MG CAPSULE    TAKE 1 TABLET(25 MG) BY MOUTH DAILY FOR 7 DAYS THEN TAKE 2 TABLETS(50 MG) BY MOUTH DAILY   VALACYCLOVIR  (VALTREX ) 1000 MG TABLET    TAKE 1 TABLET(1000 MG) BY MOUTH DAILY   VRAYLAR 1.5 MG CAPSULE    Take 1.5 mg by mouth daily.  Modified Medications   No medications on file  Discontinued Medications   No medications on file    Allergies: Allergies  Allergen Reactions   Latuda  [Lurasidone  Hcl] Other (See Comments)    Occular dystonia   Seroquel [Quetiapine Fumarate] Swelling    Tongue swells    Sulfur Dioxide  Itching and Other (See Comments)   Benzoin Other (See Comments)    blisters   Latex Rash and Other (See Comments)   Sulfamethoxazole-Trimethoprim Itching and Nausea And Vomiting    Labs: Lab Results  Component Value Date   HIV1RNAQUANT Not Detected 06/22/2023   HIV1RNAQUANT Not Detected 01/24/2023   HIV1RNAQUANT 98 (H) 12/23/2022   CD4TABS 1,729 12/23/2022   CD4TABS 1,061 02/23/2022   CD4TABS 1,434 08/20/2021    RPR and STI Lab Results  Component Value Date   LABRPR NON-REACTIVE 02/23/2022   LABRPR NON-REACTIVE 08/20/2021   LABRPR NON-REACTIVE 02/06/2021   LABRPR NON-REACTIVE 05/15/2020   LABRPR NON-REACTIVE 08/28/2019    STI Results GC GC CT CT  08/21/2021 10:57 AM Negative   Negative    02/06/2021 10:33 AM Negative   Negative    05/16/2020 10:07 AM Negative   Negative    08/28/2019  4:04 PM Negative   Negative    09/21/2018 12:00 AM Negative   Negative    06/27/2018 12:00 AM Negative   Negative    01/02/2018 12:00 AM Negative   Negative    10/27/2017 12:00 AM Negative   Negative    08/03/2017 12:00 AM Negative   Negative    04/06/2017 12:00 AM Negative   Negative    08/13/2016 12:00 AM Negative   Negative    02/06/2016 12:00 AM Negative   Negative    11/05/2015 12:00 AM Negative   Negative    06/06/2015 12:00 AM Negative   Negative    12/26/2014 12:00 AM Negative   Negative    02/12/2013 11:13 AM  NEGATIVE   NEGATIVE   06/21/2010 12:52 AM   NEGATIVE (NOTE)  Testing performed using the BD ProbeTec Qx Chlamydia trachomatis and Neisseria gonorrhea amplified DNA assay.  Performed at:  [facility]               [address]                [place], [place] [address]               [id number]    07/12/2007  8:56 PM   NEGATIVE (NOTE)  Testing performed using the BD Probetec ET Chlamydia trachomatis and Neisseria gonorrhea amplified DNA assay.      Hepatitis B Lab Results  Component Value Date   HEPBSAB  REACTIVE (A) 08/03/2017   HEPBSAG NEG 11/10/2006   HEPBCAB NEG 11/10/2006   Hepatitis C Lab Results  Component Value Date   HEPCAB NON-REACTIVE 02/23/2022   Hepatitis A Lab Results  Component Value Date   HAV REACTIVE (A) 01/01/2022   Lipids: Lab Results  Component Value Date   CHOL 219 (H) 02/06/2021   TRIG 92 02/06/2021   HDL 46 (L) 02/06/2021   CHOLHDL 4.8 02/06/2021   VLDL 25 04/06/2017   LDLCALC 153 (H) 02/06/2021    TARGET DATE: The 6th of the month  Assessment: [name] presents today for her maintenance Cabenuva  injections. Past injections were tolerated well without issues. Due for visit with Dr. Overton in March 2025. He is booked/out of office during her window. We will schedule with Alan and get her in with Dr. Overton for the following appointment. She was undetectable in November. *** STI testing today. Declines flu and COVID vaccines today.   Note LDL in 2020 of 153, but now >200 on 04/28/23. Discussed with her the impressive results of the REPRIEVE trial, showing a 35% reduction in cardiac events, even in patients with low cardiac risk. Patient is taking rosuvastatin outpatient, added to medication list  Administered cabotegravir  600mg /14mL in left upper outer quadrant of the gluteal muscle. Administered rilpivirine  900 mg/3mL in the right upper outer quadrant of the gluteal muscle. No issues with injections. *** will follow up in 2 months for next set of injections.  Plan: - Cabenuva  injections administered - *** statin therapy - Next injections scheduled for 10/18/23 with Alan, and *** with Dr. Overton.  - Call with any issues or questions  Con Laughter, PharmD PGY-2 Infectious Diseases Pharmacy Resident [facility]

## 2023-08-23 ENCOUNTER — Encounter: Payer: BC Managed Care – PPO | Admitting: Pharmacist

## 2023-08-29 ENCOUNTER — Other Ambulatory Visit: Payer: Self-pay

## 2023-08-29 ENCOUNTER — Ambulatory Visit: Payer: BC Managed Care – PPO | Admitting: Internal Medicine

## 2023-08-29 VITALS — BP 132/84 | HR 84 | Resp 16 | Ht 64.0 in | Wt 205.0 lb

## 2023-08-29 DIAGNOSIS — Z23 Encounter for immunization: Secondary | ICD-10-CM | POA: Diagnosis not present

## 2023-08-29 DIAGNOSIS — B20 Human immunodeficiency virus [HIV] disease: Secondary | ICD-10-CM | POA: Diagnosis not present

## 2023-08-29 MED ORDER — CABOTEGRAVIR & RILPIVIRINE ER 600 & 900 MG/3ML IM SUER
1.0000 | Freq: Once | INTRAMUSCULAR | Status: AC
  Administered 2023-08-29: 1 via INTRAMUSCULAR

## 2023-08-29 NOTE — Progress Notes (Signed)
 [facility]   CHIEF COMPLAINT    HIV follow up.    SUBJECTIVE:    [name] is a 43 y.o. female with PMHx dm2, anxiety, asthma, bipolar depression, here in clinic for HIV follow up.    08/29/23 id clinic f/u: Please see A&P for the details of today's visit and status of the patient's medical problems.   Patient's Medications  New Prescriptions   No medications on file  Previous Medications   ALBUTEROL  (PROVENTIL ) (2.5 MG/3ML) 0.083% NEBULIZER SOLUTION    Take 3 mLs (2.5 mg total) by nebulization every 4 (four) hours as needed for wheezing or shortness of breath (coughing fits).   ALBUTEROL  (VENTOLIN  HFA) 108 (90 BASE) MCG/ACT INHALER    Inhale 2 puffs into the lungs every 4 (four) hours as needed for wheezing or shortness of breath (coughing fits).   ATOMOXETINE (STRATTERA) 25 MG CAPSULE    Take 25 mg by mouth daily.   CABOTEGRAVIR  & RILPIVIRINE  ER (CABENUVA ) 600 & 900 MG/3ML INJECTION    Inject 1 kit into the muscle every 2 (two) months.   CETIRIZINE  (ZYRTEC ) 10 MG TABLET    Take 2 tablets (20 mg total) by mouth at bedtime.   DENTA 5000 PLUS  1.1 % CREA DENTAL CREAM       FLUOCINOLONE ACETONIDE BODY 0.01 % OIL    Apply to scalp once daily as needed for itching/rash.   FLUTICASONE  (FLONASE ) 50 MCG/ACT NASAL SPRAY    Place 1 spray into both nostrils daily.   FLUTICASONE -SALMETEROL,SENSOR, (AIRDUO DIGIHALER ) 232-14 MCG/ACT AEPB    Inhale 1 puff into the lungs in the morning and at bedtime. Rinse mouth after each use.   HYDROCORTISONE  2.5 % CREAM    Place rectally.   KETOCONAZOLE  (NIZORAL ) 2 % CREAM       METHOCARBAMOL  (ROBAXIN ) 500 MG TABLET    Take 1 tablet (500 mg total) by mouth 2 (two) times daily.   MONTELUKAST  (SINGULAIR ) 10 MG TABLET    TAKE 1 TABLET BY MOUTH AT BEDTIME   NORETHINDRONE  (AYGESTIN ) 5 MG TABLET    Take 10 mg by mouth daily.   OLOPATADINE -MOMETASONE  (RYALTRIS ) 665-25 MCG/ACT SUSP    Place 1-2 sprays into the nose in the  morning and at bedtime.   OMEPRAZOLE  (PRILOSEC) 40 MG CAPSULE    Take 1 capsule (40 mg total) by mouth daily. Nothing to eat or drink for 20-30 minutes.   OXYMETAZOLINE (AFRIN) 0.05 % NASAL SPRAY    Place 1 spray into both nostrils 2 (two) times daily. For <7 days at a time.   PREGABALIN (LYRICA) 25 MG CAPSULE    Take by mouth.   RELUGOLIX-ESTRADIOL-NORETHIND (MYFEMBREE) 40-1-0.5 MG TABS       ROSUVASTATIN (CRESTOR) 20 MG TABLET    Take 20 mg by mouth at bedtime.   TOPIRAMATE  (TOPAMAX ) 25 MG CAPSULE    TAKE 1 TABLET(25 MG) BY MOUTH DAILY FOR 7 DAYS THEN TAKE 2 TABLETS(50 MG) BY MOUTH DAILY   VALACYCLOVIR  (VALTREX ) 1000 MG TABLET    TAKE 1 TABLET(1000 MG) BY MOUTH DAILY   VRAYLAR 1.5 MG CAPSULE    Take 1.5 mg by mouth daily.  Modified Medications   No medications on file  Discontinued Medications   No medications on file      Past Medical History:  Diagnosis Date   Allergy    Anemia    Anxiety    Anxiety state, unspecified 12/07/2012  Arthritis    Asthma    Bipolar disorder, unspecified (HCC) 12/07/2012   Depression    Diabetes mellitus without complication (HCC)    no meds currently- controlled with diet   GERD (gastroesophageal reflux disease)    GERD (gastroesophageal reflux disease) 12/07/2012   Headache(784.0)    migraines   Herpes genitalis in women 12/07/2012   HIV infection (HCC)    Human immunodeficiency virus (HIV) disease (HCC) 12/07/2012   Neuromuscular disorder (HCC)    carpal tunnel in both arms   SHINGLES 01/31/2009   Qualifier: Diagnosis of  By: Janna MD, Burnard     Type II or unspecified type diabetes mellitus without mention of complication, not stated as uncontrolled 12/07/2012   Unspecified asthma(493.90) 12/07/2012    Social History   Tobacco Use   Smoking status: Never   Smokeless tobacco: Never  Vaping Use   Vaping status: Never Used  Substance Use Topics   Alcohol  use: Yes    Alcohol /week: 0.0 standard drinks of alcohol     Comment: OCC.about once a  month    Drug use: No    Family History  Problem Relation Age of Onset   Arthritis/Rheumatoid Mother    Hypertension Mother    Arthritis/Rheumatoid Father    Cancer Maternal Grandmother        breasts   Cancer Paternal Grandmother        breast and kidney   Cancer Maternal Aunt        brand and lung    Allergies  Allergen Reactions   Latuda  [Lurasidone  Hcl] Other (See Comments)    Occular dystonia   Seroquel [Quetiapine Fumarate] Swelling    Tongue swells    Sulfur Dioxide Itching and Other (See Comments)   Benzoin Other (See Comments)    blisters   Latex Rash and Other (See Comments)   Sulfamethoxazole-Trimethoprim Itching and Nausea And Vomiting    Review of Systems  Constitutional: Negative.   Gastrointestinal: Negative.      OBJECTIVE:    Vitals:   08/29/23 1433  BP: 132/84  Pulse: 84  Resp: 16  Weight: 205 lb (93 kg)  Height: 5' 4 (1.626 m)      There is no height or weight on file to calculate BMI.  Physical Exam Constitutional:      Appearance: Normal appearance.  Eyes:     Extraocular Movements: Extraocular movements intact.     Conjunctiva/sclera: Conjunctivae normal.  Pulmonary:     Effort: Pulmonary effort is normal. No respiratory distress.  Skin:    General: Skin is warm and dry.  Neurological:     General: No focal deficit present.     Mental Status: She is alert and oriented to person, place, and time.  Psychiatric:        Behavior: Behavior normal.     Labs and Microbiology:    Latest Ref Rng & Units 08/20/2021    4:05 PM 07/17/2021    3:31 PM 02/06/2021   10:13 AM  CMP  Glucose 65 - 99 mg/dL 83  886  877   BUN 7 - 25 mg/dL 21  18  17    Creatinine 0.50 - 0.99 mg/dL 8.92  9.02  8.90   Sodium 135 - 146 mmol/L 140  140  136   Potassium 3.5 - 5.3 mmol/L 4.3  4.2  4.6   Chloride 98 - 110 mmol/L 103  103  103   CO2 20 - 32 mmol/L 30  24  26  Calcium 8.6 - 10.2 mg/dL 9.7  9.5  9.3   Total Protein 6.1 - 8.1 g/dL 7.0  6.4  6.7    Total Bilirubin 0.2 - 1.2 mg/dL 0.5  0.4  0.5   Alkaline Phos 44 - 121 IU/L  82    AST 10 - 30 U/L 17  16  16    ALT 6 - 29 U/L 18  18  16        Latest Ref Rng & Units 08/23/2022    9:33 AM 08/20/2021    4:05 PM 07/17/2021    3:31 PM  CBC  WBC 3.4 - 10.8 x10E3/uL 10.8  9.6  8.8   Hemoglobin 11.1 - 15.9 g/dL 85.0  85.6  86.3   Hematocrit 34.0 - 46.6 % 46.2  41.8  39.2   Platelets 150 - 450 x10E3/uL 365  339  340      Lab Results  Component Value Date   HIV1RNAQUANT Not Detected 06/22/2023   HIV1RNAQUANT Not Detected 01/24/2023   HIV1RNAQUANT 98 (H) 12/23/2022   CD4TABS 1,729 12/23/2022   CD4TABS 1,061 02/23/2022   CD4TABS 1,434 08/20/2021    RPR and STI: Lab Results  Component Value Date   LABRPR NON-REACTIVE 02/23/2022   LABRPR NON-REACTIVE 08/20/2021   LABRPR NON-REACTIVE 02/06/2021   LABRPR NON-REACTIVE 05/15/2020   LABRPR NON-REACTIVE 08/28/2019    STI Results GC GC CT CT  08/21/2021 10:57 AM Negative   Negative    02/06/2021 10:33 AM Negative   Negative    05/16/2020 10:07 AM Negative   Negative    08/28/2019  4:04 PM Negative   Negative    09/21/2018 12:00 AM Negative   Negative    06/27/2018 12:00 AM Negative   Negative    01/02/2018 12:00 AM Negative   Negative    10/27/2017 12:00 AM Negative   Negative    08/03/2017 12:00 AM Negative   Negative    04/06/2017 12:00 AM Negative   Negative    08/13/2016 12:00 AM Negative   Negative    02/06/2016 12:00 AM Negative   Negative    11/05/2015 12:00 AM Negative   Negative    06/06/2015 12:00 AM Negative   Negative    12/26/2014 12:00 AM Negative   Negative    02/12/2013 11:13 AM  NEGATIVE   NEGATIVE   06/21/2010 12:52 AM   NEGATIVE (NOTE)  Testing performed using the BD ProbeTec Qx Chlamydia trachomatis and Neisseria gonorrhea amplified DNA assay.  Performed at:  [facility]               [address]                [place], [place] [address]                [id number]    07/12/2007  8:56 PM   NEGATIVE (NOTE)  Testing performed using the BD Probetec ET Chlamydia trachomatis and Neisseria gonorrhea amplified DNA assay.      Hepatitis B: Lab Results  Component Value Date   HEPBSAB REACTIVE (A) 08/03/2017   HEPBSAG NEG 11/10/2006   HEPBCAB NEG 11/10/2006   Hepatitis C: Lab Results  Component Value Date   HEPCAB NON-REACTIVE 02/23/2022   Hepatitis A: Lab Results  Component Value Date   HAV REACTIVE (A) 01/01/2022   Lipids: Lab Results  Component Value Date  CHOL 219 (H) 02/06/2021   TRIG 92 02/06/2021   HDL 46 (L) 02/06/2021   CHOLHDL 4.8 02/06/2021   VLDL 25 04/06/2017   LDLCALC 153 (H) 02/06/2021      ASSESSMENT & PLAN:    No problem-specific Assessment & Plan notes found for this encounter.  #hiv ?dx'ed 2008 obgyn Heterosexual transmission Cd4 nadir <10; oi thrush Therapy: Lamivudine/tenofovir /isentress  2013 --> Tivicay /truvada /prezista -norvir  --> Genvoya/prezista  --> biktarvy  --> cabenuva   Patient has been tolerating cabenuva  q103months; last injection 06/22/23, a little late for this injection. Missed pharmacy visit a week prior  Preivous labs have shown great virologic/immunologic control Lab Results  Component Value Date   HIV1RNAQUANT Not Detected 06/22/2023   Lab Results  Component Value Date   CD4TCELL 45 12/23/2022   CD4TABS 1,729 12/23/2022     -discussed u=u -encourage compliance -continue current HIV medication -labs in 6 months -f/u in 6 months with me; continue to f/u q2 months with our pharmacy team for ( a viral load alone can be repeated on next pharmacy visit) cabenuva  -cabenuva  injection administered today   #bipolar (in chart) but patient seen psychiatry before and not bipolar #anxiety depression #adhd -doing well with behavior therapy; her edwin seems to help with depression -f/u psychiatry for ongoing management   #asthma #allergic rhinitis -mild intermittent  -continue  albuterol , ryaltris , singulair , fluticasone /salmeterol inhaler   #hx right oophorectomy and partial hysterectomy (can still conceive) #oral birth control use On ocp for adenomyosis   #hcm -vaccination S/p hep b non-conj vaccine by 2016 S/p meningococcal booster 2023 S/p polysacharide pneumococcal vaccine 2023 -- offer conj vaccine prevnar 20 today 04/20/23 -hepatitis S/p 3 doses recombinant vaccine (non-adjuvant) in 2016 -- we plan to check hepatitis b sAb titer on next visit with  me -std screening Patient to do with primary care/gyn at her desire -tb screening Will do on next visit with me -cancer screening F/u pcp for age appropriate cancer screening Hx abnormal pap 2014 needing colposcopy (negative); last pap 2018 negative   Constance DASEN [facility] 08/29/2023, 2:27 PM

## 2023-08-29 NOTE — Patient Instructions (Addendum)
 Please see pharmacy 2 months from now  You can see pharmacy in 4 months as well or if appointment with me already made we can keep that and we'll do labs at that time   Flu shot today

## 2023-08-30 DIAGNOSIS — D251 Intramural leiomyoma of uterus: Secondary | ICD-10-CM | POA: Diagnosis not present

## 2023-08-30 DIAGNOSIS — N8003 Adenomyosis of the uterus: Secondary | ICD-10-CM | POA: Diagnosis not present

## 2023-08-30 DIAGNOSIS — G43919 Migraine, unspecified, intractable, without status migrainosus: Secondary | ICD-10-CM | POA: Diagnosis not present

## 2023-08-30 DIAGNOSIS — N939 Abnormal uterine and vaginal bleeding, unspecified: Secondary | ICD-10-CM | POA: Diagnosis not present

## 2023-09-07 DIAGNOSIS — F332 Major depressive disorder, recurrent severe without psychotic features: Secondary | ICD-10-CM | POA: Diagnosis not present

## 2023-09-07 DIAGNOSIS — F900 Attention-deficit hyperactivity disorder, predominantly inattentive type: Secondary | ICD-10-CM | POA: Diagnosis not present

## 2023-09-14 DIAGNOSIS — F332 Major depressive disorder, recurrent severe without psychotic features: Secondary | ICD-10-CM | POA: Diagnosis not present

## 2023-09-14 DIAGNOSIS — F900 Attention-deficit hyperactivity disorder, predominantly inattentive type: Secondary | ICD-10-CM | POA: Diagnosis not present

## 2023-09-19 ENCOUNTER — Telehealth: Payer: Self-pay

## 2023-09-19 DIAGNOSIS — G43909 Migraine, unspecified, not intractable, without status migrainosus: Secondary | ICD-10-CM | POA: Diagnosis not present

## 2023-09-19 DIAGNOSIS — J0140 Acute pansinusitis, unspecified: Secondary | ICD-10-CM | POA: Diagnosis not present

## 2023-09-19 DIAGNOSIS — R519 Headache, unspecified: Secondary | ICD-10-CM | POA: Diagnosis not present

## 2023-09-19 DIAGNOSIS — B370 Candidal stomatitis: Secondary | ICD-10-CM | POA: Diagnosis not present

## 2023-09-19 NOTE — Telephone Encounter (Signed)
Patient called office today c/o white spots in mouth and sore throat. Was told by urgent care that it could be thrush and requested she see ID provider. Has history of thrush. Last treated in November.  Negative for Covid+flu today.  Scheduled for appt tomorrow.  Juanita Laster, RMA

## 2023-09-20 ENCOUNTER — Ambulatory Visit: Payer: BC Managed Care – PPO | Admitting: Internal Medicine

## 2023-09-29 DIAGNOSIS — R051 Acute cough: Secondary | ICD-10-CM | POA: Diagnosis not present

## 2023-09-29 DIAGNOSIS — T59811A Toxic effect of smoke, accidental (unintentional), initial encounter: Secondary | ICD-10-CM | POA: Diagnosis not present

## 2023-10-17 ENCOUNTER — Encounter: Payer: BC Managed Care – PPO | Admitting: Pharmacist

## 2023-10-17 DIAGNOSIS — J0100 Acute maxillary sinusitis, unspecified: Secondary | ICD-10-CM | POA: Diagnosis not present

## 2023-10-18 ENCOUNTER — Encounter: Payer: BC Managed Care – PPO | Admitting: Pharmacist

## 2023-10-18 ENCOUNTER — Other Ambulatory Visit: Payer: Self-pay

## 2023-10-18 ENCOUNTER — Ambulatory Visit (INDEPENDENT_AMBULATORY_CARE_PROVIDER_SITE_OTHER): Payer: BC Managed Care – PPO | Admitting: Pharmacist

## 2023-10-18 DIAGNOSIS — B20 Human immunodeficiency virus [HIV] disease: Secondary | ICD-10-CM

## 2023-10-18 MED ORDER — CABOTEGRAVIR & RILPIVIRINE ER 600 & 900 MG/3ML IM SUER
1.0000 | Freq: Once | INTRAMUSCULAR | Status: AC
  Administered 2023-10-18: 1 via INTRAMUSCULAR

## 2023-10-18 NOTE — Progress Notes (Signed)
 HPI: [name] is a 43 y.o. female who presents to the [facility] pharmacy clinic for [place] administration.  Patient Active Problem List   Diagnosis Date Noted   Thrush 07/27/2022   Insomnia 10/15/2021   Not well controlled moderate persistent asthma 09/28/2021   Seasonal and perennial allergic rhinoconjunctivitis 09/28/2021   Other atopic dermatitis 07/20/2021   Hyperglycemia 08/28/2019   Obesity (BMI 35.0-39.9 without comorbidity) 07/26/2019   Costochondritis, acute 01/02/2018   Chronic tension headaches 01/23/2017   Hyperlipidemia 11/17/2015   Frequent sinus infections 06/30/2015   Hernia, abdominal 08/05/2014   LGSIL of cervix of undetermined significance 10/24/2013   Superficial Liver masses, right lobe; suspected dermoid recurrence.   05/07/2013   Ovarian cystic mass 05/07/2013   s/p Serosal repair of colon 12/04/2012 12/15/2012   Herpes genitalis in women 12/07/2012   Anxiety state 12/07/2012   GERD (gastroesophageal reflux disease) 12/07/2012   Generalized anxiety disorder 12/07/2012   Pelvic mass in female s/p Robotic-assisted right salpingo-oophorectomy 12/04/2012   Hemorrhoids 12/07/2006   Depression 11/25/2006   Other allergic rhinitis 11/25/2006   ASTHMA, COUGH VARIANT 11/15/2006   Human immunodeficiency virus (HIV) disease (HCC) 11/10/2006   Urticaria 11/10/2006    Patient's Medications  New Prescriptions   No medications on file  Previous Medications   ALBUTEROL (PROVENTIL) (2.5 MG/3ML) 0.083% NEBULIZER SOLUTION    Take 3 mLs (2.5 mg total) by nebulization every 4 (four) hours as needed for wheezing or shortness of breath (coughing fits).   ALBUTEROL (VENTOLIN HFA) 108 (90 BASE) MCG/ACT INHALER    Inhale 2 puffs into the lungs every 4 (four) hours as needed for wheezing or shortness of breath (coughing fits).   ATOMOXETINE (STRATTERA) 80 MG CAPSULE    Take 80 mg by mouth daily.   CABOTEGRAVIR & RILPIVIRINE ER (CABENUVA) 600 & 900 MG/3ML INJECTION     Inject 1 kit into the muscle every 2 (two) months.   CETIRIZINE (ZYRTEC) 10 MG TABLET    Take 2 tablets (20 mg total) by mouth at bedtime.   CLONIDINE (CATAPRES - DOSED IN MG/24 HR) 0.1 MG/24HR PATCH    Place 0.1 mg onto the skin once a week.   DENTA 5000 PLUS 1.1 % CREA DENTAL CREAM       FLUOCINOLONE ACETONIDE BODY 0.01 % OIL    Apply to scalp once daily as needed for itching/rash.   FLUTICASONE (FLONASE) 50 MCG/ACT NASAL SPRAY    Place 1 spray into both nostrils daily.   FLUTICASONE-SALMETEROL,SENSOR, (AIRDUO DIGIHALER) 232-14 MCG/ACT AEPB    Inhale 1 puff into the lungs in the morning and at bedtime. Rinse mouth after each use.   HYDROCORTISONE 2.5 % CREAM    Place rectally.   KETOCONAZOLE (NIZORAL) 2 % CREAM       METHOCARBAMOL (ROBAXIN) 500 MG TABLET    Take 1 tablet (500 mg total) by mouth 2 (two) times daily.   MONTELUKAST (SINGULAIR) 10 MG TABLET    TAKE 1 TABLET BY MOUTH AT BEDTIME   NORETHINDRONE (AYGESTIN) 5 MG TABLET    Take 10 mg by mouth daily.   OLOPATADINE-MOMETASONE (RYALTRIS) [id number] MCG/ACT SUSP    Place 1-2 sprays into the nose in the morning and at bedtime.   OMEPRAZOLE (PRILOSEC) 40 MG CAPSULE    Take 1 capsule (40 mg total) by mouth daily. Nothing to eat or drink for 20-30 minutes.   OXYMETAZOLINE (AFRIN) 0.05 % NASAL SPRAY    Place 1 spray into both nostrils 2 (two)  times daily. For <7 days at a time.   PREGABALIN (LYRICA) 25 MG CAPSULE    Take by mouth.   RELUGOLIX-ESTRADIOL-NORETHIND (MYFEMBREE) 40-1-0.5 MG TABS       ROSUVASTATIN (CRESTOR) 20 MG TABLET    Take 20 mg by mouth at bedtime.   TOPIRAMATE (TOPAMAX) 25 MG CAPSULE    TAKE 1 TABLET(25 MG) BY MOUTH DAILY FOR 7 DAYS THEN TAKE 2 TABLETS(50 MG) BY MOUTH DAILY   VALACYCLOVIR (VALTREX) 1000 MG TABLET    TAKE 1 TABLET(1000 MG) BY MOUTH DAILY   VORTIOXETINE HBR (TRINTELLIX) 10 MG TABS TABLET    Take 10 mg by mouth daily.   VRAYLAR 1.5 MG CAPSULE    Take 1.5 mg by mouth daily.  Modified Medications   No medications on  file  Discontinued Medications   No medications on file    Allergies: Allergies  Allergen Reactions   Latuda [Lurasidone Hcl] Other (See Comments)    Occular dystonia   Seroquel [Quetiapine Fumarate] Swelling    Tongue swells    Sulfur Dioxide Itching and Other (See Comments)   Benzoin Other (See Comments)    blisters   Latex Rash and Other (See Comments)   Sulfamethoxazole-Trimethoprim Itching and Nausea And Vomiting    Past Medical History: Past Medical History:  Diagnosis Date   Allergy    Anemia    Anxiety    Anxiety state, unspecified 12/07/2012   Arthritis    Asthma    Bipolar disorder, unspecified (HCC) 12/07/2012   Depression    Diabetes mellitus without complication (HCC)    no meds currently- controlled with diet   GERD (gastroesophageal reflux disease)    GERD (gastroesophageal reflux disease) 12/07/2012   Headache(784.0)    migraines   Herpes genitalis in women 12/07/2012   HIV infection (HCC)    Human immunodeficiency virus (HIV) disease (HCC) 12/07/2012   Neuromuscular disorder (HCC)    carpal tunnel in both arms   SHINGLES 01/31/2009   Qualifier: Diagnosis of  By: Philipp Deputy MD, Tresa Endo     Type II or unspecified type diabetes mellitus without mention of complication, not stated as uncontrolled 12/07/2012   Unspecified asthma(493.90) 12/07/2012    Social History: Social History   Socioeconomic History   Marital status: Single    Spouse name: Not on file   Number of children: 1   Years of education: 12   Highest education level: Some college, no degree  Occupational History   Occupation: Nutrition Chief Financial Officer: [employer]  Tobacco Use   Smoking status: Never   Smokeless tobacco: Never  Vaping Use   Vaping status: Never Used  Substance and Sexual Activity   Alcohol use: Yes    Alcohol/week: 0.0 standard drinks of alcohol    Comment: OCC.about once a month    Drug use: No   Sexual activity: Never    Partners: Male    Birth  control/protection: Implant    Comment: DECLINED CONDOMS  Other Topics Concern   Not on file  Social History Narrative   Patient lives alone.   Patient works at home.   Patient has hs education.   Patient has 1 child.   Patient drinks multiple energy drinks and coffees throughout day, daily.      Patient is right-handed. She lives with her daughter in a one level home. She drinks one cup of coffee a day. She works for [employer] at [employer] in [employer] and is very  active at work.   Social Drivers of Corporate investment banker Strain: Not on file  Food Insecurity: Low Risk  (08/15/2023)   Received from [facility]   Hunger Vital Sign    Worried About Running Out of Food in the Last Year: Never true    Ran Out of Food in the Last Year: Never true  Transportation Needs: No Transportation Needs (08/15/2023)   Received from [facility]    In the past 12 months, has lack of reliable transportation kept you from medical appointments, meetings, work or from getting things needed for daily living? : No  Physical Activity: Not on file  Stress: Not on file  Social Connections: Not on file    Labs: Lab Results  Component Value Date   HIV1RNAQUANT Not Detected 06/22/2023   HIV1RNAQUANT Not Detected 01/24/2023   HIV1RNAQUANT 98 (H) 12/23/2022   CD4TABS 1,729 12/23/2022   CD4TABS 1,061 02/23/2022   CD4TABS 1,434 08/20/2021    RPR and STI Lab Results  Component Value Date   LABRPR NON-REACTIVE 02/23/2022   LABRPR NON-REACTIVE 08/20/2021   LABRPR NON-REACTIVE 02/06/2021   LABRPR NON-REACTIVE 05/15/2020   LABRPR NON-REACTIVE 08/28/2019    STI Results GC GC CT CT  08/21/2021 10:57 AM Negative   Negative    02/06/2021 10:33 AM Negative   Negative    05/16/2020 10:07 AM Negative   Negative    08/28/2019  4:04 PM Negative   Negative    09/21/2018 12:00 AM Negative   Negative    06/27/2018 12:00 AM Negative   Negative    01/02/2018 12:00 AM  Negative   Negative    10/27/2017 12:00 AM Negative   Negative    08/03/2017 12:00 AM Negative   Negative    04/06/2017 12:00 AM Negative   Negative    08/13/2016 12:00 AM Negative   Negative    02/06/2016 12:00 AM Negative   Negative    11/05/2015 12:00 AM Negative   Negative    06/06/2015 12:00 AM Negative   Negative    12/26/2014 12:00 AM Negative   Negative    02/12/2013 11:13 AM  NEGATIVE   NEGATIVE   06/21/2010 12:52 AM   NEGATIVE (NOTE)  Testing performed using the BD ProbeTec Qx Chlamydia trachomatis and Neisseria gonorrhea amplified DNA assay.  Performed at:  [facility]               [address]                [place], [place] [address]               [id number]    07/12/2007  8:56 PM   NEGATIVE (NOTE)  Testing performed using the BD Probetec ET Chlamydia trachomatis and Neisseria gonorrhea amplified DNA assay.      Hepatitis B Lab Results  Component Value Date   HEPBSAB REACTIVE (A) 08/03/2017   HEPBSAG NEG 11/10/2006   HEPBCAB NEG 11/10/2006   Hepatitis C Lab Results  Component Value Date   HEPCAB NON-REACTIVE 02/23/2022   Hepatitis A Lab Results  Component Value Date   HAV REACTIVE (A) 01/01/2022   Lipids: Lab Results  Component Value Date   CHOL 219 (H) 02/06/2021   TRIG 92 02/06/2021   HDL 46 (L) 02/06/2021   CHOLHDL 4.8 02/06/2021   VLDL 25  04/06/2017   LDLCALC 153 (H) 02/06/2021    TARGET DATE:  The 6th of the month  Assessment: [name] presents today for their maintenance Cabenuva injections. Initial/past injections were tolerated well without issues. No problems with systemic effects of injections. Patient did experience knot on her right side which is not usual for her. Avoided area during administration today.   Administered cabotegravir 600mg /69mL in left upper outer quadrant of the gluteal muscle. Administered rilpivirine 900 mg/77mL in the right upper outer quadrant of the gluteal  muscle. Monitored patient for 10 minutes after injection. Injections were tolerated well without issue. Patient will follow up in 2 months for next injection. Will check HIV RNA today per Dr. Riley Churches states she is experiencing significant environmental allergies this week and has been taking steroids for several days. Recommended multiple OTC options that she can purchase for cheaper prices through [facility] in our building. She states she has noticed a trend between her viral blips and taking steroids. Will continue to monitor for such connections.  States her fiance is moving to [place] next month and she would like him to initiate PrEP preferably with [employer]. He can establish as a patient here and make an appointment with me. She states she would love to become pregnant in the future - would have to wait until December this year due to her recent uterine artery embolization. Discussed concerns about continuing Cabenuva and shared we have utilized this off-label strategy in the clinic. Reviewed a possible plan for her and will follow up with Dr. Renold Don.   Plan: Renaldo Harrison injections administered - Check HIV RNA - Next injections scheduled for 4/29 with me  - Call with any issues or questions  Margarite Gouge, PharmD, CPP, BCIDP, AAHIVP Clinical Pharmacist Practitioner Infectious Diseases Clinical Pharmacist [facility]

## 2023-10-21 LAB — HIV-1 RNA QUANT-NO REFLEX-BLD
HIV 1 RNA Quant: 22 {copies}/mL — ABNORMAL HIGH
HIV-1 RNA Quant, Log: 1.34 {Log_copies}/mL — ABNORMAL HIGH

## 2023-11-03 DIAGNOSIS — N8003 Adenomyosis of the uterus: Secondary | ICD-10-CM | POA: Diagnosis not present

## 2023-11-03 DIAGNOSIS — Z9889 Other specified postprocedural states: Secondary | ICD-10-CM | POA: Diagnosis not present

## 2023-11-03 DIAGNOSIS — N939 Abnormal uterine and vaginal bleeding, unspecified: Secondary | ICD-10-CM | POA: Diagnosis not present

## 2023-11-12 DIAGNOSIS — Z9889 Other specified postprocedural states: Secondary | ICD-10-CM | POA: Diagnosis not present

## 2023-11-12 DIAGNOSIS — N939 Abnormal uterine and vaginal bleeding, unspecified: Secondary | ICD-10-CM | POA: Diagnosis not present

## 2023-11-12 DIAGNOSIS — Z79899 Other long term (current) drug therapy: Secondary | ICD-10-CM | POA: Diagnosis not present

## 2023-11-12 DIAGNOSIS — N8003 Adenomyosis of the uterus: Secondary | ICD-10-CM | POA: Diagnosis not present

## 2023-11-12 DIAGNOSIS — N938 Other specified abnormal uterine and vaginal bleeding: Secondary | ICD-10-CM | POA: Diagnosis not present

## 2023-11-22 DIAGNOSIS — N8003 Adenomyosis of the uterus: Secondary | ICD-10-CM | POA: Diagnosis not present

## 2023-11-22 DIAGNOSIS — D251 Intramural leiomyoma of uterus: Secondary | ICD-10-CM | POA: Diagnosis not present

## 2023-11-22 DIAGNOSIS — Z9889 Other specified postprocedural states: Secondary | ICD-10-CM | POA: Diagnosis not present

## 2023-11-22 DIAGNOSIS — N939 Abnormal uterine and vaginal bleeding, unspecified: Secondary | ICD-10-CM | POA: Diagnosis not present

## 2023-11-29 DIAGNOSIS — M7918 Myalgia, other site: Secondary | ICD-10-CM | POA: Diagnosis not present

## 2023-12-12 ENCOUNTER — Telehealth: Payer: Self-pay

## 2023-12-12 ENCOUNTER — Other Ambulatory Visit (HOSPITAL_COMMUNITY): Payer: Self-pay

## 2023-12-12 NOTE — Progress Notes (Unsigned)
 HPI: [name] is a 43 y.o. female who presents to the [facility] pharmacy clinic for Cabenuva  administration.  Patient Active Problem List   Diagnosis Date Noted   Thrush 07/27/2022   Insomnia 10/15/2021   Not well controlled moderate persistent asthma 09/28/2021   Seasonal and perennial allergic rhinoconjunctivitis 09/28/2021   Other atopic dermatitis 07/20/2021   Hyperglycemia 08/28/2019   Obesity (BMI 35.0-39.9 without comorbidity) 07/26/2019   Costochondritis, acute 01/02/2018   Chronic tension headaches 01/23/2017   Hyperlipidemia 11/17/2015   Frequent sinus infections 06/30/2015   Hernia, abdominal 08/05/2014   LGSIL of cervix of undetermined significance 10/24/2013   Superficial Liver masses, right lobe; suspected dermoid recurrence.   05/07/2013   Ovarian cystic mass 05/07/2013   s/p Serosal repair of colon 12/04/2012 12/15/2012   Herpes genitalis in women 12/07/2012   Anxiety state 12/07/2012   GERD (gastroesophageal reflux disease) 12/07/2012   Generalized anxiety disorder 12/07/2012   Pelvic mass in female s/p Robotic-assisted right salpingo-oophorectomy 12/04/2012   Hemorrhoids 12/07/2006   Depression 11/25/2006   Other allergic rhinitis 11/25/2006   ASTHMA, COUGH VARIANT 11/15/2006   Human immunodeficiency virus (HIV) disease (HCC) 11/10/2006   Urticaria 11/10/2006    Patient's Medications  New Prescriptions   No medications on file  Previous Medications   ALBUTEROL  (PROVENTIL ) (2.5 MG/3ML) 0.083% NEBULIZER SOLUTION    Take 3 mLs (2.5 mg total) by nebulization every 4 (four) hours as needed for wheezing or shortness of breath (coughing fits).   ALBUTEROL  (VENTOLIN  HFA) 108 (90 BASE) MCG/ACT INHALER    Inhale 2 puffs into the lungs every 4 (four) hours as needed for wheezing or shortness of breath (coughing fits).   ATOMOXETINE (STRATTERA) 80 MG CAPSULE    Take 80 mg by mouth daily.   CABOTEGRAVIR  & RILPIVIRINE  ER (CABENUVA ) 600 & 900 MG/3ML INJECTION     Inject 1 kit into the muscle every 2 (two) months.   CETIRIZINE  (ZYRTEC ) 10 MG TABLET    Take 2 tablets (20 mg total) by mouth at bedtime.   CLONIDINE (CATAPRES - DOSED IN MG/24 HR) 0.1 MG/24HR PATCH    Place 0.1 mg onto the skin once a week.   DENTA 5000 PLUS 1.1 % CREA DENTAL CREAM       FLUOCINOLONE ACETONIDE BODY 0.01 % OIL    Apply to scalp once daily as needed for itching/rash.   FLUTICASONE  (FLONASE ) 50 MCG/ACT NASAL SPRAY    Place 1 spray into both nostrils daily.   FLUTICASONE -SALMETEROL,SENSOR, (AIRDUO DIGIHALER ) 232-14 MCG/ACT AEPB    Inhale 1 puff into the lungs in the morning and at bedtime. Rinse mouth after each use.   HYDROCORTISONE  2.5 % CREAM    Place rectally.   KETOCONAZOLE  (NIZORAL ) 2 % CREAM       METHOCARBAMOL  (ROBAXIN ) 500 MG TABLET    Take 1 tablet (500 mg total) by mouth 2 (two) times daily.   MONTELUKAST  (SINGULAIR ) 10 MG TABLET    TAKE 1 TABLET BY MOUTH AT BEDTIME   NORETHINDRONE  (AYGESTIN ) 5 MG TABLET    Take 10 mg by mouth daily.   OLOPATADINE -MOMETASONE  (RYALTRIS ) 665-25 MCG/ACT SUSP    Place 1-2 sprays into the nose in the morning and at bedtime.   OMEPRAZOLE  (PRILOSEC) 40 MG CAPSULE    Take 1 capsule (40 mg total) by mouth daily. Nothing to eat or drink for 20-30 minutes.   OXYMETAZOLINE (AFRIN) 0.05 % NASAL SPRAY    Place 1 spray into both nostrils 2 (two)  times daily. For <7 days at a time.   PREGABALIN (LYRICA) 25 MG CAPSULE    Take by mouth.   RELUGOLIX-ESTRADIOL-NORETHIND (MYFEMBREE) 40-1-0.5 MG TABS       ROSUVASTATIN (CRESTOR) 20 MG TABLET    Take 20 mg by mouth at bedtime.   TOPIRAMATE  (TOPAMAX ) 25 MG CAPSULE    TAKE 1 TABLET(25 MG) BY MOUTH DAILY FOR 7 DAYS THEN TAKE 2 TABLETS(50 MG) BY MOUTH DAILY   VALACYCLOVIR  (VALTREX ) 1000 MG TABLET    TAKE 1 TABLET(1000 MG) BY MOUTH DAILY   VORTIOXETINE HBR (TRINTELLIX) 10 MG TABS TABLET    Take 10 mg by mouth daily.   VRAYLAR 1.5 MG CAPSULE    Take 1.5 mg by mouth daily.  Modified Medications   No medications on  file  Discontinued Medications   No medications on file    Allergies: Allergies  Allergen Reactions   Latuda  [Lurasidone  Hcl] Other (See Comments)    Occular dystonia   Seroquel [Quetiapine Fumarate] Swelling    Tongue swells    Sulfur Dioxide Itching and Other (See Comments)   Benzoin Other (See Comments)    blisters   Latex Rash and Other (See Comments)   Sulfamethoxazole-Trimethoprim Itching and Nausea And Vomiting    Labs: Lab Results  Component Value Date   HIV1RNAQUANT 22 (H) 10/18/2023   HIV1RNAQUANT Not Detected 06/22/2023   HIV1RNAQUANT Not Detected 01/24/2023   CD4TABS 1,729 12/23/2022   CD4TABS 1,061 02/23/2022   CD4TABS 1,434 08/20/2021    RPR and STI Lab Results  Component Value Date   LABRPR NON-REACTIVE 02/23/2022   LABRPR NON-REACTIVE 08/20/2021   LABRPR NON-REACTIVE 02/06/2021   LABRPR NON-REACTIVE 05/15/2020   LABRPR NON-REACTIVE 08/28/2019    STI Results GC GC CT CT  08/21/2021 10:57 AM Negative   Negative    02/06/2021 10:33 AM Negative   Negative    05/16/2020 10:07 AM Negative   Negative    08/28/2019  4:04 PM Negative   Negative    09/21/2018 12:00 AM Negative   Negative    06/27/2018 12:00 AM Negative   Negative    01/02/2018 12:00 AM Negative   Negative    10/27/2017 12:00 AM Negative   Negative    08/03/2017 12:00 AM Negative   Negative    04/06/2017 12:00 AM Negative   Negative    08/13/2016 12:00 AM Negative   Negative    02/06/2016 12:00 AM Negative   Negative    11/05/2015 12:00 AM Negative   Negative    06/06/2015 12:00 AM Negative   Negative    12/26/2014 12:00 AM Negative   Negative    02/12/2013 11:13 AM  NEGATIVE   NEGATIVE   06/21/2010 12:52 AM   NEGATIVE (NOTE)  Testing performed using the BD ProbeTec Qx Chlamydia trachomatis and Neisseria gonorrhea amplified DNA assay.  Performed at:  [facility]               [address]                [place], [place]  [address]               [id number]    07/12/2007  8:56 PM   NEGATIVE (NOTE)  Testing performed using the BD Probetec ET Chlamydia trachomatis and Neisseria gonorrhea amplified DNA assay.      Hepatitis B  Lab Results  Component Value Date   HEPBSAB REACTIVE (A) 08/03/2017   HEPBSAG NEG 11/10/2006   HEPBCAB NEG 11/10/2006   Hepatitis C Lab Results  Component Value Date   HEPCAB NON-REACTIVE 02/23/2022   Hepatitis A Lab Results  Component Value Date   HAV REACTIVE (A) 01/01/2022   Lipids: Lab Results  Component Value Date   CHOL 219 (H) 02/06/2021   TRIG 92 02/06/2021   HDL 46 (L) 02/06/2021   CHOLHDL 4.8 02/06/2021   VLDL 25 04/06/2017   LDLCALC 153 (H) 02/06/2021    TARGET DATE: 6th   Assessment: Donneisha presents today for 2 month maintenance Cabenuva  injections. Past injections were tolerated well without issues. Last HIV RNA was 22 and considered undetectable in 10/2023. Last CD4 was 1729 in 12/2022, will defer CD4 count until next lab check. Expresses that she has been experiencing what she believes may be oral thrush on and off for the past year. States that she has been prescribed nystatin and it has not been helping but has used fluconazole  in the past, unsure if this had helped her. She has taken steroids recently due to allergies and asthma. Discussed that this can increase the risk of acquiring thrush. Discussed that we can let her provider Dr. Shereen Dike know about her concerns and see if he would like to see her.   Administered cabotegravir  600mg /63mL in left upper outer quadrant of the gluteal muscle. Administered rilpivirine  900 mg/3mL in the right upper outer quadrant of the gluteal muscle. No issues with injections. Elorah will follow up in 2 months for next set of injections.  Immunizations: eligible for shingles vaccination. She agrees to receiving it today.   Plan: - Cabenuva  injections administered - Shingrix vaccination administered to right deltoid, due for next  vaccine in 2-6 months - Next injections scheduled for 02/14/2024 with Mylinda Asa - Call with any issues or questions  Barbra Boone, PharmD PGY1 Acute Care Pharmacy Resident  12/13/2023 4:15 PM

## 2023-12-12 NOTE — Telephone Encounter (Signed)
 Pharmacy Patient Advocate Encounter- Cabenuva  BIV-Medical Benefit:  J code: [id number]  CPT code: 19147  Dx Code: B20  NO PA is required through [facility]. Authorization# [id number]  Notification will be in Media

## 2023-12-12 NOTE — Telephone Encounter (Signed)
 Submitted a Prior Authorization request to [facility] for Cabenuva  via  [employer] Portal. Will update once we receive a response.  J Code: [id number] CPT:B20  PA ID: [id number]

## 2023-12-13 ENCOUNTER — Ambulatory Visit (INDEPENDENT_AMBULATORY_CARE_PROVIDER_SITE_OTHER): Admitting: Pharmacist

## 2023-12-13 ENCOUNTER — Other Ambulatory Visit: Payer: Self-pay

## 2023-12-13 DIAGNOSIS — B20 Human immunodeficiency virus [HIV] disease: Secondary | ICD-10-CM | POA: Diagnosis not present

## 2023-12-13 DIAGNOSIS — Z23 Encounter for immunization: Secondary | ICD-10-CM

## 2023-12-13 MED ORDER — CABOTEGRAVIR & RILPIVIRINE ER 600 & 900 MG/3ML IM SUER
1.0000 | Freq: Once | INTRAMUSCULAR | Status: AC
  Administered 2023-12-13: 1 via INTRAMUSCULAR

## 2023-12-27 NOTE — Progress Notes (Signed)
 The 10-year ASCVD risk score (Arnett DK, et al., 2019) is: 10.6%   Values used to calculate the score:     Age: 43 years     Sex: Female     Is Non-Hispanic African American: Yes     Diabetic: Yes     Tobacco smoker: No     Systolic Blood Pressure: 136 mmHg     Is BP treated: No     HDL Cholesterol: 31 mg/dL     Total Cholesterol: 251 mg/dL  Arlon Bergamo, BSN, RN

## 2023-12-29 DIAGNOSIS — K219 Gastro-esophageal reflux disease without esophagitis: Secondary | ICD-10-CM | POA: Diagnosis not present

## 2023-12-29 DIAGNOSIS — R1033 Periumbilical pain: Secondary | ICD-10-CM | POA: Diagnosis not present

## 2023-12-29 DIAGNOSIS — K5900 Constipation, unspecified: Secondary | ICD-10-CM | POA: Diagnosis not present

## 2024-01-24 DIAGNOSIS — E782 Mixed hyperlipidemia: Secondary | ICD-10-CM | POA: Diagnosis not present

## 2024-01-24 DIAGNOSIS — K5900 Constipation, unspecified: Secondary | ICD-10-CM | POA: Diagnosis not present

## 2024-01-24 DIAGNOSIS — K219 Gastro-esophageal reflux disease without esophagitis: Secondary | ICD-10-CM | POA: Diagnosis not present

## 2024-01-24 DIAGNOSIS — R1030 Lower abdominal pain, unspecified: Secondary | ICD-10-CM | POA: Diagnosis not present

## 2024-02-13 NOTE — Progress Notes (Unsigned)
 HPI: [name] is a 43 y.o. female who presents to the [facility] pharmacy clinic for Cabenuva  administration.  Patient Active Problem List   Diagnosis Date Noted   Thrush 07/27/2022   Insomnia 10/15/2021   Not well controlled moderate persistent asthma 09/28/2021   Seasonal and perennial allergic rhinoconjunctivitis 09/28/2021   Other atopic dermatitis 07/20/2021   Hyperglycemia 08/28/2019   Obesity (BMI 35.0-39.9 without comorbidity) 07/26/2019   Costochondritis, acute 01/02/2018   Chronic tension headaches 01/23/2017   Hyperlipidemia 11/17/2015   Frequent sinus infections 06/30/2015   Hernia, abdominal 08/05/2014   LGSIL of cervix of undetermined significance 10/24/2013   Superficial Liver masses, right lobe; suspected dermoid recurrence.   05/07/2013   Ovarian cystic mass 05/07/2013   s/p Serosal repair of colon 12/04/2012 12/15/2012   Herpes genitalis in women 12/07/2012   Anxiety state 12/07/2012   GERD (gastroesophageal reflux disease) 12/07/2012   Generalized anxiety disorder 12/07/2012   Pelvic mass in female s/p Robotic-assisted right salpingo-oophorectomy 12/04/2012   Hemorrhoids 12/07/2006   Depression 11/25/2006   Other allergic rhinitis 11/25/2006   ASTHMA, COUGH VARIANT 11/15/2006   Human immunodeficiency virus (HIV) disease (HCC) 11/10/2006   Urticaria 11/10/2006    Patient's Medications  New Prescriptions   No medications on file  Previous Medications   ALBUTEROL  (PROVENTIL ) (2.5 MG/3ML) 0.083% NEBULIZER SOLUTION    Take 3 mLs (2.5 mg total) by nebulization every 4 (four) hours as needed for wheezing or shortness of breath (coughing fits).   ALBUTEROL  (VENTOLIN  HFA) 108 (90 BASE) MCG/ACT INHALER    Inhale 2 puffs into the lungs every 4 (four) hours as needed for wheezing or shortness of breath (coughing fits).   ATOMOXETINE (STRATTERA) 80 MG CAPSULE    Take 80 mg by mouth daily.   CABOTEGRAVIR  & RILPIVIRINE  ER (CABENUVA ) 600 & 900 MG/3ML INJECTION     Inject 1 kit into the muscle every 2 (two) months.   CETIRIZINE  (ZYRTEC ) 10 MG TABLET    Take 2 tablets (20 mg total) by mouth at bedtime.   CLONIDINE (CATAPRES - DOSED IN MG/24 HR) 0.1 MG/24HR PATCH    Place 0.1 mg onto the skin once a week.   DENTA 5000 PLUS 1.1 % CREA DENTAL CREAM       FLUOCINOLONE ACETONIDE BODY 0.01 % OIL    Apply to scalp once daily as needed for itching/rash.   FLUTICASONE  (FLONASE ) 50 MCG/ACT NASAL SPRAY    Place 1 spray into both nostrils daily.   FLUTICASONE -SALMETEROL,SENSOR, (AIRDUO DIGIHALER ) 232-14 MCG/ACT AEPB    Inhale 1 puff into the lungs in the morning and at bedtime. Rinse mouth after each use.   HYDROCORTISONE  2.5 % CREAM    Place rectally.   KETOCONAZOLE  (NIZORAL ) 2 % CREAM       METHOCARBAMOL  (ROBAXIN ) 500 MG TABLET    Take 1 tablet (500 mg total) by mouth 2 (two) times daily.   MONTELUKAST  (SINGULAIR ) 10 MG TABLET    TAKE 1 TABLET BY MOUTH AT BEDTIME   NORETHINDRONE  (AYGESTIN ) 5 MG TABLET    Take 10 mg by mouth daily.   OLOPATADINE -MOMETASONE  (RYALTRIS ) 665-25 MCG/ACT SUSP    Place 1-2 sprays into the nose in the morning and at bedtime.   OMEPRAZOLE  (PRILOSEC) 40 MG CAPSULE    Take 1 capsule (40 mg total) by mouth daily. Nothing to eat or drink for 20-30 minutes.   OXYMETAZOLINE (AFRIN) 0.05 % NASAL SPRAY    Place 1 spray into both nostrils 2 (two)  times daily. For <7 days at a time.   PREGABALIN (LYRICA) 25 MG CAPSULE    Take by mouth.   RELUGOLIX-ESTRADIOL-NORETHIND (MYFEMBREE) 40-1-0.5 MG TABS       ROSUVASTATIN (CRESTOR) 20 MG TABLET    Take 20 mg by mouth at bedtime.   TOPIRAMATE  (TOPAMAX ) 25 MG CAPSULE    TAKE 1 TABLET(25 MG) BY MOUTH DAILY FOR 7 DAYS THEN TAKE 2 TABLETS(50 MG) BY MOUTH DAILY   VALACYCLOVIR  (VALTREX ) 1000 MG TABLET    TAKE 1 TABLET(1000 MG) BY MOUTH DAILY   VORTIOXETINE HBR (TRINTELLIX) 10 MG TABS TABLET    Take 10 mg by mouth daily.   VRAYLAR 1.5 MG CAPSULE    Take 1.5 mg by mouth daily.  Modified Medications   No medications on  file  Discontinued Medications   No medications on file    Allergies: Allergies  Allergen Reactions   Latuda  [Lurasidone  Hcl] Other (See Comments)    Occular dystonia   Seroquel [Quetiapine Fumarate] Swelling    Tongue swells    Sulfur Dioxide Itching and Other (See Comments)   Benzoin Other (See Comments)    blisters   Latex Rash and Other (See Comments)   Sulfamethoxazole-Trimethoprim Itching and Nausea And Vomiting    Past Medical History: Past Medical History:  Diagnosis Date   Allergy    Anemia    Anxiety    Anxiety state, unspecified 12/07/2012   Arthritis    Asthma    Bipolar disorder, unspecified (HCC) 12/07/2012   Depression    Diabetes mellitus without complication (HCC)    no meds currently- controlled with diet   GERD (gastroesophageal reflux disease)    GERD (gastroesophageal reflux disease) 12/07/2012   Headache(784.0)    migraines   Herpes genitalis in women 12/07/2012   HIV infection (HCC)    Human immunodeficiency virus (HIV) disease (HCC) 12/07/2012   Neuromuscular disorder (HCC)    carpal tunnel in both arms   SHINGLES 01/31/2009   Qualifier: Diagnosis of  By: Janna MD, Burnard     Type II or unspecified type diabetes mellitus without mention of complication, not stated as uncontrolled 12/07/2012   Unspecified asthma(493.90) 12/07/2012    Social History: Social History   Socioeconomic History   Marital status: Single    Spouse name: Not on file   Number of children: 1   Years of education: 12   Highest education level: Some college, no degree  Occupational History   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   Patient works at home.   Patient has hs education.   Patient has 1 child.   Patient drinks multiple energy drinks and coffees throughout day, daily.      Patient is right-handed. She lives with her daughter in a one level home. She drinks one cup of coffee a day. She works for [employer] at [employer] in [employer] and is very  active at work.   Social Drivers of Corporate investment banker Strain: Not on file  Food Insecurity: Low Risk  (08/15/2023)   Received from [facility]   Hunger Vital Sign    Within the past 12 months, you worried that your food would run out before you got money to buy more: Never true    Within the past 12 months, the food you bought just didn't last and you didn't have money to get more. : Never true  Transportation Needs: No Transportation Needs (08/15/2023)   Received from [facility]    In the past 12 months, has lack of reliable transportation kept you from medical appointments, meetings, work or from getting things needed for daily living? : No  Physical Activity: Not on file  Stress: Not on file  Social Connections: Not on file    Labs: Lab Results  Component Value Date   HIV1RNAQUANT 22 (H) 10/18/2023   HIV1RNAQUANT Not Detected 06/22/2023   HIV1RNAQUANT Not Detected 01/24/2023   CD4TABS 1,729 12/23/2022   CD4TABS 1,061 02/23/2022   CD4TABS 1,434 08/20/2021    RPR and STI Lab Results  Component Value Date   LABRPR NON-REACTIVE 02/23/2022   LABRPR NON-REACTIVE 08/20/2021   LABRPR NON-REACTIVE 02/06/2021   LABRPR NON-REACTIVE 05/15/2020   LABRPR NON-REACTIVE 08/28/2019    STI Results GC GC CT CT  08/21/2021 10:57 AM Negative   Negative    02/06/2021 10:33 AM Negative   Negative    05/16/2020 10:07 AM Negative   Negative    08/28/2019  4:04 PM Negative    Negative    09/21/2018 12:00 AM Negative   Negative    06/27/2018 12:00 AM Negative   Negative    01/02/2018 12:00 AM Negative   Negative    10/27/2017 12:00 AM Negative   Negative    08/03/2017 12:00 AM Negative   Negative    04/06/2017 12:00 AM Negative   Negative    08/13/2016 12:00 AM Negative   Negative    02/06/2016 12:00 AM Negative   Negative    11/05/2015 12:00 AM Negative   Negative    06/06/2015 12:00 AM Negative   Negative    12/26/2014 12:00 AM Negative   Negative    02/12/2013 11:13 AM  NEGATIVE   NEGATIVE   06/21/2010 12:52 AM   NEGATIVE (NOTE)  Testing performed using the BD ProbeTec Qx Chlamydia trachomatis and Neisseria gonorrhea amplified DNA assay.  Performed at:  [facility]               [address]                [place], [place] [address]               [id number]    07/12/2007  8:56 PM   NEGATIVE (NOTE)  Testing performed using the BD Probetec ET Chlamydia trachomatis and Neisseria gonorrhea amplified DNA assay.      Hepatitis B Lab Results  Component Value Date   HEPBSAB REACTIVE (A) 08/03/2017   HEPBSAG NEG 11/10/2006   HEPBCAB NEG 11/10/2006   Hepatitis C Lab Results  Component Value Date   HEPCAB NON-REACTIVE 02/23/2022   Hepatitis A Lab Results  Component Value Date   HAV REACTIVE (A) 01/01/2022   Lipids: Lab Results  Component Value Date   CHOL  219 (H) 02/06/2021   TRIG 92 02/06/2021   HDL 46 (L) 02/06/2021   CHOLHDL 4.8 02/06/2021   VLDL 25 04/06/2017   LDLCALC 153 (H) 02/06/2021    TARGET DATE:  The 6th of the month  Assessment: [name] presents today for their maintenance Cabenuva  injections. Initial/past injections were tolerated well without issues. No problems with systemic effects of injections. She met with Rosaline today to renew PCAP. Needs to follow up with Dr. Overton, but their schedules conflicted during next injection window. Will schedule her with Dr. Overton for  November appointment.   Administered cabotegravir  600mg /49mL in left upper outer quadrant of the gluteal muscle. Administered rilpivirine  900 mg/3mL in the right upper outer quadrant of the gluteal muscle. Monitored patient for 10 minutes after injection. Injections were tolerated well without issue. Patient will follow up in 2 months for next injection. Will defer HIV RNA until next visit.  Accepts 2/2 Shingrix  vaccine today.   Plan: - Cabenuva  injections administered - Next injections scheduled for 9/2 with me  - Administer 2/2 Shingrix  vaccine  - Call with any issues or questions  Alan Geralds, PharmD, CPP, BCIDP, AAHIVP Clinical Pharmacist Practitioner Infectious Diseases Clinical Pharmacist [facility]

## 2024-02-14 ENCOUNTER — Ambulatory Visit

## 2024-02-14 ENCOUNTER — Other Ambulatory Visit: Payer: Self-pay

## 2024-02-14 ENCOUNTER — Ambulatory Visit: Admitting: Pharmacist

## 2024-02-14 DIAGNOSIS — B20 Human immunodeficiency virus [HIV] disease: Secondary | ICD-10-CM | POA: Diagnosis not present

## 2024-02-14 DIAGNOSIS — Z23 Encounter for immunization: Secondary | ICD-10-CM

## 2024-02-14 MED ORDER — CABOTEGRAVIR & RILPIVIRINE ER 600 & 900 MG/3ML IM SUER
1.0000 | Freq: Once | INTRAMUSCULAR | Status: AC
  Administered 2024-02-14: 1 via INTRAMUSCULAR

## 2024-02-21 ENCOUNTER — Ambulatory Visit

## 2024-02-21 ENCOUNTER — Other Ambulatory Visit: Payer: Self-pay

## 2024-02-21 MED ORDER — SODIUM FLUORIDE 1.1 % DT CREA
TOPICAL_CREAM | DENTAL | 3 refills | Status: AC
  Filled 2024-02-21: qty 51, 30d supply, fill #0
  Filled 2024-04-27: qty 51, 30d supply, fill #1

## 2024-03-01 ENCOUNTER — Other Ambulatory Visit: Payer: Self-pay

## 2024-03-02 ENCOUNTER — Other Ambulatory Visit: Payer: Self-pay

## 2024-03-07 ENCOUNTER — Other Ambulatory Visit: Payer: Self-pay | Admitting: Medical Genetics

## 2024-03-08 ENCOUNTER — Other Ambulatory Visit

## 2024-03-08 DIAGNOSIS — Z006 Encounter for examination for normal comparison and control in clinical research program: Secondary | ICD-10-CM

## 2024-03-24 LAB — GENECONNECT MOLECULAR SCREEN: Genetic Analysis Overall Interpretation: NEGATIVE

## 2024-04-13 NOTE — Progress Notes (Signed)
 HPI: [name] is a 43 y.o. female who presents to the [facility] pharmacy clinic for Cabenuva  administration.  Referring ID Physician: Dr. Overton  Patient Active Problem List   Diagnosis Date Noted   Thrush 07/27/2022   Insomnia 10/15/2021   Not well controlled moderate persistent asthma 09/28/2021   Seasonal and perennial allergic rhinoconjunctivitis 09/28/2021   Other atopic dermatitis 07/20/2021   Hyperglycemia 08/28/2019   Obesity (BMI 35.0-39.9 without comorbidity) 07/26/2019   Costochondritis, acute 01/02/2018   Chronic tension headaches 01/23/2017   Hyperlipidemia 11/17/2015   Frequent sinus infections 06/30/2015   Hernia, abdominal 08/05/2014   LGSIL of cervix of undetermined significance 10/24/2013   Superficial Liver masses, right lobe; suspected dermoid recurrence.   05/07/2013   Ovarian cystic mass 05/07/2013   s/p Serosal repair of colon 12/04/2012 12/15/2012   Herpes genitalis in women 12/07/2012   Anxiety state 12/07/2012   GERD (gastroesophageal reflux disease) 12/07/2012   Generalized anxiety disorder 12/07/2012   Pelvic mass in female s/p Robotic-assisted right salpingo-oophorectomy 12/04/2012   Hemorrhoids 12/07/2006   Depression 11/25/2006   Other allergic rhinitis 11/25/2006   ASTHMA, COUGH VARIANT 11/15/2006   Human immunodeficiency virus (HIV) disease (HCC) 11/10/2006   Urticaria 11/10/2006    Patient's Medications  New Prescriptions   No medications on file  Previous Medications   ALBUTEROL  (PROVENTIL ) (2.5 MG/3ML) 0.083% NEBULIZER SOLUTION    Take 3 mLs (2.5 mg total) by nebulization every 4 (four) hours as needed for wheezing or shortness of breath (coughing fits).   ALBUTEROL  (VENTOLIN  HFA) 108 (90 BASE) MCG/ACT INHALER    Inhale 2 puffs into the lungs every 4 (four) hours as needed for wheezing or shortness of breath (coughing fits).   ATOMOXETINE (STRATTERA) 80 MG CAPSULE    Take 80 mg by mouth daily.   CABOTEGRAVIR  & RILPIVIRINE  ER (CABENUVA )  600 & 900 MG/3ML INJECTION    Inject 1 kit into the muscle every 2 (two) months.   CETIRIZINE  (ZYRTEC ) 10 MG TABLET    Take 2 tablets (20 mg total) by mouth at bedtime.   CLONIDINE (CATAPRES - DOSED IN MG/24 HR) 0.1 MG/24HR PATCH    Place 0.1 mg onto the skin once a week.   DENTA 5000 PLUS  1.1 % CREA DENTAL CREAM       FLUOCINOLONE ACETONIDE BODY 0.01 % OIL    Apply to scalp once daily as needed for itching/rash.   FLUTICASONE  (FLONASE ) 50 MCG/ACT NASAL SPRAY    Place 1 spray into both nostrils daily.   FLUTICASONE -SALMETEROL,SENSOR, (AIRDUO DIGIHALER ) 232-14 MCG/ACT AEPB    Inhale 1 puff into the lungs in the morning and at bedtime. Rinse mouth after each use.   HYDROCORTISONE  2.5 % CREAM    Place rectally.   KETOCONAZOLE  (NIZORAL ) 2 % CREAM       METHOCARBAMOL  (ROBAXIN ) 500 MG TABLET    Take 1 tablet (500 mg total) by mouth 2 (two) times daily.   MONTELUKAST  (SINGULAIR ) 10 MG TABLET    TAKE 1 TABLET BY MOUTH AT BEDTIME   NORETHINDRONE  (AYGESTIN ) 5 MG TABLET    Take 10 mg by mouth daily.   OLOPATADINE -MOMETASONE  (RYALTRIS ) 665-25 MCG/ACT SUSP    Place 1-2 sprays into the nose in the morning and at bedtime.   OMEPRAZOLE  (PRILOSEC) 40 MG CAPSULE    Take 1 capsule (40 mg total) by mouth daily. Nothing to eat or drink for 20-30 minutes.   OXYMETAZOLINE (AFRIN) 0.05 % NASAL SPRAY    Place 1  spray into both nostrils 2 (two) times daily. For <7 days at a time.   PREGABALIN (LYRICA) 25 MG CAPSULE    Take by mouth.   RELUGOLIX-ESTRADIOL-NORETHIND (MYFEMBREE) 40-1-0.5 MG TABS       ROSUVASTATIN (CRESTOR) 20 MG TABLET    Take 20 mg by mouth at bedtime.   SODIUM FLUORIDE  (PREVIDENT  5000 PLUS) 1.1 % CREA DENTAL CREAM    Use pea size of the toothpaste and brush every evening.Spit out excess and do NOT rinse with water.   TOPIRAMATE  (TOPAMAX ) 25 MG CAPSULE    TAKE 1 TABLET(25 MG) BY MOUTH DAILY FOR 7 DAYS THEN TAKE 2 TABLETS(50 MG) BY MOUTH DAILY   VALACYCLOVIR  (VALTREX ) 1000 MG TABLET    TAKE 1 TABLET(1000  MG) BY MOUTH DAILY   VORTIOXETINE HBR (TRINTELLIX) 10 MG TABS TABLET    Take 10 mg by mouth daily.   VRAYLAR 1.5 MG CAPSULE    Take 1.5 mg by mouth daily.  Modified Medications   No medications on file  Discontinued Medications   No medications on file    Allergies: Allergies  Allergen Reactions   Latuda  [Lurasidone  Hcl] Other (See Comments)    Occular dystonia   Seroquel [Quetiapine Fumarate] Swelling    Tongue swells    Sulfur Dioxide Itching and Other (See Comments)   Benzoin Other (See Comments)    blisters   Latex Rash and Other (See Comments)   Sulfamethoxazole-Trimethoprim Itching and Nausea And Vomiting    Past Medical History: Past Medical History:  Diagnosis Date   Allergy    Anemia    Anxiety    Anxiety state, unspecified 12/07/2012   Arthritis    Asthma    Bipolar disorder, unspecified (HCC) 12/07/2012   Depression    Diabetes mellitus without complication (HCC)    no meds currently- controlled with diet   GERD (gastroesophageal reflux disease)    GERD (gastroesophageal reflux disease) 12/07/2012   Headache(784.0)    migraines   Herpes genitalis in women 12/07/2012   HIV infection (HCC)    Human immunodeficiency virus (HIV) disease (HCC) 12/07/2012   Neuromuscular disorder (HCC)    carpal tunnel in both arms   SHINGLES 01/31/2009   Qualifier: Diagnosis of  By: Janna MD, Burnard     Type II or unspecified type diabetes mellitus without mention of complication, not stated as uncontrolled 12/07/2012   Unspecified asthma(493.90) 12/07/2012    Social History: Social History   Socioeconomic History   Marital status: Single    Spouse name: Not on file   Number of children: 1   Years of education: 12   Highest education level: Some college, no degree  Occupational History   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   Patient works at home.   Patient has hs education.   Patient has 1 child.   Patient drinks multiple energy drinks and coffees throughout day, daily.  Patient is right-handed. She lives with her daughter in a one level home. She drinks one cup of coffee a day. She works for [employer] at [employer] in [employer] and is very active at work.   Social Drivers of Corporate investment banker Strain: Not on file  Food Insecurity: Low Risk  (02/28/2024)   Received from [facility]   Hunger Vital Sign    Within the past 12 months, you worried that your food would run out before you got money to buy more: Never true    Within the past 12 months, the food you bought just didn't last and you didn't have money to get more. : Never true  Transportation Needs: No Transportation Needs (02/28/2024)   Received from [facility]    In the past 12 months, has lack of reliable transportation kept you from medical appointments, meetings, work or from getting things needed for daily living? : No  Physical Activity: Not on file  Stress: Not on file  Social Connections: Not on file    Labs: Lab Results  Component Value Date   HIV1RNAQUANT 22 (H) 10/18/2023   HIV1RNAQUANT Not Detected 06/22/2023   HIV1RNAQUANT Not Detected 01/24/2023   CD4TABS 1,729 12/23/2022   CD4TABS 1,061 02/23/2022   CD4TABS 1,434 08/20/2021    RPR and STI Lab Results  Component Value Date   LABRPR NON-REACTIVE 02/23/2022   LABRPR NON-REACTIVE 08/20/2021   LABRPR NON-REACTIVE 02/06/2021   LABRPR NON-REACTIVE 05/15/2020   LABRPR NON-REACTIVE 08/28/2019     STI Results GC GC CT CT  08/21/2021 10:57 AM Negative   Negative    02/06/2021 10:33 AM Negative   Negative    05/16/2020 10:07 AM Negative   Negative    08/28/2019  4:04 PM Negative   Negative    09/21/2018 12:00 AM Negative   Negative    06/27/2018 12:00 AM Negative   Negative    01/02/2018 12:00 AM Negative   Negative    10/27/2017 12:00 AM Negative   Negative    08/03/2017 12:00 AM Negative   Negative    04/06/2017 12:00 AM Negative   Negative    08/13/2016 12:00 AM Negative   Negative    02/06/2016 12:00 AM Negative   Negative    11/05/2015 12:00 AM Negative   Negative    06/06/2015 12:00 AM Negative   Negative    12/26/2014 12:00 AM Negative   Negative    02/12/2013 11:13 AM  NEGATIVE   NEGATIVE   06/21/2010 12:52 AM   NEGATIVE (NOTE)  Testing performed using the BD ProbeTec Qx Chlamydia trachomatis and Neisseria gonorrhea amplified DNA assay.  Performed at:  [facility]               [address]                [place], [place] [address]               [id number]    07/12/2007  8:56 PM   NEGATIVE (NOTE)  Testing performed using the BD Probetec ET Chlamydia trachomatis and Neisseria gonorrhea amplified DNA assay.      Hepatitis B Lab Results  Component Value Date   HEPBSAB REACTIVE (A) 08/03/2017   HEPBSAG NEG 11/10/2006   HEPBCAB NEG 11/10/2006   Hepatitis C Lab Results  Component  Value Date   HEPCAB NON-REACTIVE 02/23/2022   Hepatitis A Lab Results  Component Value Date   HAV REACTIVE (A) 01/01/2022   Lipids: Lab Results  Component Value Date   CHOL 219 (H) 02/06/2021   TRIG 92 02/06/2021   HDL 46 (L) 02/06/2021   CHOLHDL 4.8 02/06/2021   VLDL 25 04/06/2017   LDLCALC 153 (H) 02/06/2021    TARGET DATE:  The 6th  of the month  Assessment: [name] presents today for their maintenance Cabenuva  injections. Initial/past injections were tolerated well without issues. No problems with systemic  effects of injections.   Administered cabotegravir  600mg /69mL in left upper outer quadrant of the gluteal muscle. Administered rilpivirine  900 mg/3mL in the right upper outer quadrant of the gluteal muscle. Monitored patient for 10 minutes after injection. Injections were tolerated well without issue. Patient will follow up in 2 months for next injection. Will check HIV RNA today along with routine annual CD4  count and lipid panel. CBC and CMP assessed in May this year through [facility].   Eligible for flu vaccine today which she politely defers as she is currently getting over a sinus infection. Will meet with flu clinic on Monday to receive flu shot.   Plan: - Cabenuva  injections administered - Check HIV RNA, CD4 count, and lipid panel - Follow up on 04/23/24 for flu shot  - Next injections scheduled for 10/31 with Dr. Overton and 12/30 with me  - Call with any issues or questions  Alan Geralds, PharmD, CPP, BCIDP, AAHIVP Clinical Pharmacist Practitioner Infectious Diseases Clinical Pharmacist [facility]

## 2024-04-13 NOTE — Progress Notes (Deleted)
 HPI: [name] is a 43 y.o. female who presents to the [facility] pharmacy clinic for Cabenuva  administration.  Referring ID Physician: Dr. Overton  Patient Active Problem List   Diagnosis Date Noted   Thrush 07/27/2022   Insomnia 10/15/2021   Not well controlled moderate persistent asthma 09/28/2021   Seasonal and perennial allergic rhinoconjunctivitis 09/28/2021   Other atopic dermatitis 07/20/2021   Hyperglycemia 08/28/2019   Obesity (BMI 35.0-39.9 without comorbidity) 07/26/2019   Costochondritis, acute 01/02/2018   Chronic tension headaches 01/23/2017   Hyperlipidemia 11/17/2015   Frequent sinus infections 06/30/2015   Hernia, abdominal 08/05/2014   LGSIL of cervix of undetermined significance 10/24/2013   Superficial Liver masses, right lobe; suspected dermoid recurrence.   05/07/2013   Ovarian cystic mass 05/07/2013   s/p Serosal repair of colon 12/04/2012 12/15/2012   Herpes genitalis in women 12/07/2012   Anxiety state 12/07/2012   GERD (gastroesophageal reflux disease) 12/07/2012   Generalized anxiety disorder 12/07/2012   Pelvic mass in female s/p Robotic-assisted right salpingo-oophorectomy 12/04/2012   Hemorrhoids 12/07/2006   Depression 11/25/2006   Other allergic rhinitis 11/25/2006   ASTHMA, COUGH VARIANT 11/15/2006   Human immunodeficiency virus (HIV) disease (HCC) 11/10/2006   Urticaria 11/10/2006    Patient's Medications  New Prescriptions   No medications on file  Previous Medications   ALBUTEROL  (PROVENTIL ) (2.5 MG/3ML) 0.083% NEBULIZER SOLUTION    Take 3 mLs (2.5 mg total) by nebulization every 4 (four) hours as needed for wheezing or shortness of breath (coughing fits).   ALBUTEROL  (VENTOLIN  HFA) 108 (90 BASE) MCG/ACT INHALER    Inhale 2 puffs into the lungs every 4 (four) hours as needed for wheezing or shortness of breath (coughing fits).   ATOMOXETINE (STRATTERA) 80 MG CAPSULE    Take 80 mg by mouth daily.   CABOTEGRAVIR  & RILPIVIRINE  ER (CABENUVA )  600 & 900 MG/3ML INJECTION    Inject 1 kit into the muscle every 2 (two) months.   CETIRIZINE  (ZYRTEC ) 10 MG TABLET    Take 2 tablets (20 mg total) by mouth at bedtime.   CLONIDINE (CATAPRES - DOSED IN MG/24 HR) 0.1 MG/24HR PATCH    Place 0.1 mg onto the skin once a week.   DENTA 5000 PLUS  1.1 % CREA DENTAL CREAM       FLUOCINOLONE ACETONIDE BODY 0.01 % OIL    Apply to scalp once daily as needed for itching/rash.   FLUTICASONE  (FLONASE ) 50 MCG/ACT NASAL SPRAY    Place 1 spray into both nostrils daily.   FLUTICASONE -SALMETEROL,SENSOR, (AIRDUO DIGIHALER ) 232-14 MCG/ACT AEPB    Inhale 1 puff into the lungs in the morning and at bedtime. Rinse mouth after each use.   HYDROCORTISONE  2.5 % CREAM    Place rectally.   KETOCONAZOLE  (NIZORAL ) 2 % CREAM       METHOCARBAMOL  (ROBAXIN ) 500 MG TABLET    Take 1 tablet (500 mg total) by mouth 2 (two) times daily.   MONTELUKAST  (SINGULAIR ) 10 MG TABLET    TAKE 1 TABLET BY MOUTH AT BEDTIME   NORETHINDRONE  (AYGESTIN ) 5 MG TABLET    Take 10 mg by mouth daily.   OLOPATADINE -MOMETASONE  (RYALTRIS ) 665-25 MCG/ACT SUSP    Place 1-2 sprays into the nose in the morning and at bedtime.   OMEPRAZOLE  (PRILOSEC) 40 MG CAPSULE    Take 1 capsule (40 mg total) by mouth daily. Nothing to eat or drink for 20-30 minutes.   OXYMETAZOLINE (AFRIN) 0.05 % NASAL SPRAY    Place 1  spray into both nostrils 2 (two) times daily. For <7 days at a time.   PREGABALIN (LYRICA) 25 MG CAPSULE    Take by mouth.   RELUGOLIX-ESTRADIOL-NORETHIND (MYFEMBREE) 40-1-0.5 MG TABS       ROSUVASTATIN (CRESTOR) 20 MG TABLET    Take 20 mg by mouth at bedtime.   SODIUM FLUORIDE  (PREVIDENT  5000 PLUS) 1.1 % CREA DENTAL CREAM    Use pea size of the toothpaste and brush every evening.Spit out excess and do NOT rinse with water.   TOPIRAMATE  (TOPAMAX ) 25 MG CAPSULE    TAKE 1 TABLET(25 MG) BY MOUTH DAILY FOR 7 DAYS THEN TAKE 2 TABLETS(50 MG) BY MOUTH DAILY   VALACYCLOVIR  (VALTREX ) 1000 MG TABLET    TAKE 1 TABLET(1000  MG) BY MOUTH DAILY   VORTIOXETINE HBR (TRINTELLIX) 10 MG TABS TABLET    Take 10 mg by mouth daily.   VRAYLAR 1.5 MG CAPSULE    Take 1.5 mg by mouth daily.  Modified Medications   No medications on file  Discontinued Medications   No medications on file    Allergies: Allergies  Allergen Reactions   Latuda  [Lurasidone  Hcl] Other (See Comments)    Occular dystonia   Seroquel [Quetiapine Fumarate] Swelling    Tongue swells    Sulfur Dioxide Itching and Other (See Comments)   Benzoin Other (See Comments)    blisters   Latex Rash and Other (See Comments)   Sulfamethoxazole-Trimethoprim Itching and Nausea And Vomiting    Past Medical History: Past Medical History:  Diagnosis Date   Allergy    Anemia    Anxiety    Anxiety state, unspecified 12/07/2012   Arthritis    Asthma    Bipolar disorder, unspecified (HCC) 12/07/2012   Depression    Diabetes mellitus without complication (HCC)    no meds currently- controlled with diet   GERD (gastroesophageal reflux disease)    GERD (gastroesophageal reflux disease) 12/07/2012   Headache(784.0)    migraines   Herpes genitalis in women 12/07/2012   HIV infection (HCC)    Human immunodeficiency virus (HIV) disease (HCC) 12/07/2012   Neuromuscular disorder (HCC)    carpal tunnel in both arms   SHINGLES 01/31/2009   Qualifier: Diagnosis of  By: Janna MD, Burnard     Type II or unspecified type diabetes mellitus without mention of complication, not stated as uncontrolled 12/07/2012   Unspecified asthma(493.90) 12/07/2012    Social History: Social History   Socioeconomic History   Marital status: Single    Spouse name: Not on file   Number of children: 1   Years of education: 12   Highest education level: Some college, no degree  Occupational History   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   Patient works at home.   Patient has hs education.   Patient has 1 child.   Patient drinks multiple energy drinks and coffees throughout day, daily.  Patient is right-handed. She lives with her daughter in a one level home. She drinks one cup of coffee a day. She works for [employer] at [employer] in [employer] and is very active at work.   Social Drivers of Corporate investment banker Strain: Not on file  Food Insecurity: Low Risk  (02/28/2024)   Received from [facility]   Hunger Vital Sign    Within the past 12 months, you worried that your food would run out before you got money to buy more: Never true    Within the past 12 months, the food you bought just didn't last and you didn't have money to get more. : Never true  Transportation Needs: No Transportation Needs (02/28/2024)   Received from [facility]    In the past 12 months, has lack of reliable transportation kept you from medical appointments, meetings, work or from getting things needed for daily living? : No  Physical Activity: Not on file  Stress: Not on file  Social Connections: Not on file    Labs: Lab Results  Component Value Date   HIV1RNAQUANT 22 (H) 10/18/2023   HIV1RNAQUANT Not Detected 06/22/2023   HIV1RNAQUANT Not Detected 01/24/2023   CD4TABS 1,729 12/23/2022   CD4TABS 1,061 02/23/2022   CD4TABS 1,434 08/20/2021    RPR and STI Lab Results  Component Value Date   LABRPR NON-REACTIVE 02/23/2022   LABRPR NON-REACTIVE 08/20/2021   LABRPR NON-REACTIVE 02/06/2021   LABRPR NON-REACTIVE 05/15/2020   LABRPR NON-REACTIVE 08/28/2019     STI Results GC GC CT CT  08/21/2021 10:57 AM Negative   Negative    02/06/2021 10:33 AM Negative   Negative    05/16/2020 10:07 AM Negative   Negative    08/28/2019  4:04 PM Negative   Negative    09/21/2018 12:00 AM Negative   Negative    06/27/2018 12:00 AM Negative   Negative    01/02/2018 12:00 AM Negative   Negative    10/27/2017 12:00 AM Negative   Negative    08/03/2017 12:00 AM Negative   Negative    04/06/2017 12:00 AM Negative   Negative    08/13/2016 12:00 AM Negative   Negative    02/06/2016 12:00 AM Negative   Negative    11/05/2015 12:00 AM Negative   Negative    06/06/2015 12:00 AM Negative   Negative    12/26/2014 12:00 AM Negative   Negative    02/12/2013 11:13 AM  NEGATIVE   NEGATIVE   06/21/2010 12:52 AM   NEGATIVE (NOTE)  Testing performed using the BD ProbeTec Qx Chlamydia trachomatis and Neisseria gonorrhea amplified DNA assay.  Performed at:  [facility]               [address]                [place], [place] [address]               [id number]    07/12/2007  8:56 PM   NEGATIVE (NOTE)  Testing performed using the BD Probetec ET Chlamydia trachomatis and Neisseria gonorrhea amplified DNA assay.      Hepatitis B Lab Results  Component Value Date   HEPBSAB REACTIVE (A) 08/03/2017   HEPBSAG NEG 11/10/2006   HEPBCAB NEG 11/10/2006   Hepatitis C Lab Results  Component  Value Date   HEPCAB NON-REACTIVE 02/23/2022   Hepatitis A Lab Results  Component Value Date   HAV REACTIVE (A) 01/01/2022   Lipids: Lab Results  Component Value Date   CHOL 219 (H) 02/06/2021   TRIG 92 02/06/2021   HDL 46 (L) 02/06/2021   CHOLHDL 4.8 02/06/2021   VLDL 25 04/06/2017   LDLCALC 153 (H) 02/06/2021    TARGET DATE:  The 6th  of the month  Assessment: [name] presents today for their maintenance Cabenuva  injections. Initial/past injections were tolerated well without issues. No problems with systemic  effects of injections.   Administered cabotegravir  600mg /76mL in left upper outer quadrant of the gluteal muscle. Administered rilpivirine  900 mg/3mL in the right upper outer quadrant of the gluteal muscle. Monitored patient for 10 minutes after injection. Injections were tolerated well without issue. Patient will follow up in 2 months for next injection. Will check HIV RNA today along with routine annual CD4  count and lipid panel. CBC and CMP assessed in May this year through [facility].   Eligible for flu vaccine today which she ***.   Plan: - Cabenuva  injections administered - Check HIV RNA, CD4 count, and lipid panel - Next injections scheduled for *** with Dr. Overton and *** with me  - Call with any issues or questions  Alan Geralds, PharmD, CPP, BCIDP, AAHIVP Clinical Pharmacist Practitioner Infectious Diseases Clinical Pharmacist [facility]

## 2024-04-17 ENCOUNTER — Encounter: Admitting: Pharmacist

## 2024-04-18 ENCOUNTER — Other Ambulatory Visit: Payer: Self-pay

## 2024-04-18 ENCOUNTER — Ambulatory Visit (INDEPENDENT_AMBULATORY_CARE_PROVIDER_SITE_OTHER): Admitting: Pharmacist

## 2024-04-18 DIAGNOSIS — B20 Human immunodeficiency virus [HIV] disease: Secondary | ICD-10-CM | POA: Diagnosis not present

## 2024-04-18 MED ORDER — CABOTEGRAVIR & RILPIVIRINE ER 600 & 900 MG/3ML IM SUER
1.0000 | Freq: Once | INTRAMUSCULAR | Status: AC
  Administered 2024-04-18: 1 via INTRAMUSCULAR

## 2024-04-19 LAB — T-HELPER CELLS (CD4) COUNT (NOT AT ARMC)
CD4 % Helper T Cell: 40 % (ref 33–65)
CD4 T Cell Abs: 1044 /uL (ref 400–1790)

## 2024-04-20 LAB — LIPID PANEL
Cholesterol: 205 mg/dL — ABNORMAL HIGH (ref <200)
HDL: 37 mg/dL — ABNORMAL LOW (ref >=50)
LDL Cholesterol (Calc): 140 mg/dL — ABNORMAL HIGH
Non-HDL Cholesterol (Calc): 168 mg/dL — ABNORMAL HIGH (ref <130)
Total CHOL/HDL Ratio: 5.5 (calc) — ABNORMAL HIGH (ref <5.0)
Triglycerides: 152 mg/dL — ABNORMAL HIGH (ref <150)

## 2024-04-20 LAB — HIV-1 RNA QUANT-NO REFLEX-BLD
HIV 1 RNA Quant: NOT DETECTED {copies}/mL
HIV-1 RNA Quant, Log: NOT DETECTED {Log_copies}/mL

## 2024-04-23 ENCOUNTER — Ambulatory Visit

## 2024-05-01 ENCOUNTER — Other Ambulatory Visit: Payer: Self-pay

## 2024-05-29 ENCOUNTER — Telehealth: Payer: Self-pay

## 2024-05-29 NOTE — Telephone Encounter (Signed)
 [name] with Dr. Orland ( [facility]- Podiatry) office called stating Dr. Orland is wanting to prescribe Terbinafine  for the patient. They would like to know if there is a drug interaction with the Cabenuva .  Per Alan ok for the patient to take the medications together. Informed [name] and she verbalized understanding.  [name] ONEIDA Ligas, CMA

## 2024-06-04 ENCOUNTER — Other Ambulatory Visit (HOSPITAL_COMMUNITY): Payer: Self-pay

## 2024-06-08 ENCOUNTER — Telehealth: Payer: Self-pay

## 2024-06-08 ENCOUNTER — Other Ambulatory Visit (HOSPITAL_COMMUNITY): Payer: Self-pay

## 2024-06-08 NOTE — Telephone Encounter (Signed)
 Pharmacy Patient Advocate Encounter- Cabenuva  BIV-Medical Benefit:  J code: [id number]  CPT code: 03627  Dx Code: B20  No PA is required through BCBS[place] : Plan may require chart notes be submitted with claims. Authorization# [id number]  Notification will be in Media

## 2024-06-15 ENCOUNTER — Encounter: Admitting: Internal Medicine

## 2024-06-19 ENCOUNTER — Ambulatory Visit

## 2024-06-19 ENCOUNTER — Other Ambulatory Visit: Payer: Self-pay

## 2024-06-20 ENCOUNTER — Ambulatory Visit (INDEPENDENT_AMBULATORY_CARE_PROVIDER_SITE_OTHER): Admitting: Internal Medicine

## 2024-06-20 ENCOUNTER — Other Ambulatory Visit: Payer: Self-pay

## 2024-06-20 ENCOUNTER — Encounter: Payer: Self-pay | Admitting: Internal Medicine

## 2024-06-20 VITALS — BP 124/84 | HR 70 | Temp 98.3°F | Resp 16 | Wt 221.0 lb

## 2024-06-20 DIAGNOSIS — Z1159 Encounter for screening for other viral diseases: Secondary | ICD-10-CM

## 2024-06-20 DIAGNOSIS — Z111 Encounter for screening for respiratory tuberculosis: Secondary | ICD-10-CM

## 2024-06-20 DIAGNOSIS — R635 Abnormal weight gain: Secondary | ICD-10-CM

## 2024-06-20 DIAGNOSIS — J4520 Mild intermittent asthma, uncomplicated: Secondary | ICD-10-CM

## 2024-06-20 DIAGNOSIS — B20 Human immunodeficiency virus [HIV] disease: Secondary | ICD-10-CM | POA: Diagnosis not present

## 2024-06-20 MED ORDER — CABOTEGRAVIR & RILPIVIRINE ER 600 & 900 MG/3ML IM SUER
1.0000 | Freq: Once | INTRAMUSCULAR | Status: AC
  Administered 2024-06-20: 1 via INTRAMUSCULAR

## 2024-06-20 NOTE — Addendum Note (Signed)
 Addended by: CASIMIR JUVENAL SAUNDERS on: 06/20/2024 01:13 PM   Modules accepted: Orders

## 2024-06-20 NOTE — Patient Instructions (Signed)
 I have referred you to wellness center for weight issue    Blood test for hepatitis immunity testing today    For fllu shot, please let front desk know when you would like to come back for it    Follow up with pharmacy every 2 months and we can check in again in 8-12 months

## 2024-06-20 NOTE — Progress Notes (Signed)
 [facility]   CHIEF COMPLAINT    HIV follow up.    SUBJECTIVE:    [name] is a 43 y.o. female with PMHx dm2, anxiety, asthma, bipolar depression, here in clinic for HIV follow up.   06/20/24 id clinic f/u See a&p No concern today  Patient's Medications  New Prescriptions   No medications on file  Previous Medications   ALBUTEROL  (PROVENTIL ) (2.5 MG/3ML) 0.083% NEBULIZER SOLUTION    Take 3 mLs (2.5 mg total) by nebulization every 4 (four) hours as needed for wheezing or shortness of breath (coughing fits).   ALBUTEROL  (VENTOLIN  HFA) 108 (90 BASE) MCG/ACT INHALER    Inhale 2 puffs into the lungs every 4 (four) hours as needed for wheezing or shortness of breath (coughing fits).   ATOMOXETINE (STRATTERA) 80 MG CAPSULE    Take 80 mg by mouth daily.   CABOTEGRAVIR  & RILPIVIRINE  ER (CABENUVA ) 600 & 900 MG/3ML INJECTION    Inject 1 kit into the muscle every 2 (two) months.   CETIRIZINE  (ZYRTEC ) 10 MG TABLET    Take 2 tablets (20 mg total) by mouth at bedtime.   CLONIDINE (CATAPRES - DOSED IN MG/24 HR) 0.1 MG/24HR PATCH    Place 0.1 mg onto the skin once a week.   DENTA 5000 PLUS  1.1 % CREA DENTAL CREAM       FLUOCINOLONE ACETONIDE BODY 0.01 % OIL    Apply to scalp once daily as needed for itching/rash.   FLUTICASONE  (FLONASE ) 50 MCG/ACT NASAL SPRAY    Place 1 spray into both nostrils daily.   FLUTICASONE -SALMETEROL,SENSOR, (AIRDUO DIGIHALER ) 232-14 MCG/ACT AEPB    Inhale 1 puff into the lungs in the morning and at bedtime. Rinse mouth after each use.   HYDROCORTISONE  2.5 % CREAM    Place rectally.   KETOCONAZOLE  (NIZORAL ) 2 % CREAM       METHOCARBAMOL  (ROBAXIN ) 500 MG TABLET    Take 1 tablet (500 mg total) by mouth 2 (two) times daily.   MONTELUKAST  (SINGULAIR ) 10 MG TABLET    TAKE 1 TABLET BY MOUTH AT BEDTIME   NORETHINDRONE  (AYGESTIN ) 5 MG TABLET    Take 10 mg by mouth daily.   OLOPATADINE -MOMETASONE  (RYALTRIS ) 665-25 MCG/ACT SUSP    Place  1-2 sprays into the nose in the morning and at bedtime.   OMEPRAZOLE  (PRILOSEC) 40 MG CAPSULE    Take 1 capsule (40 mg total) by mouth daily. Nothing to eat or drink for 20-30 minutes.   OXYMETAZOLINE (AFRIN) 0.05 % NASAL SPRAY    Place 1 spray into both nostrils 2 (two) times daily. For <7 days at a time.   PREGABALIN (LYRICA) 25 MG CAPSULE    Take by mouth.   RELUGOLIX-ESTRADIOL-NORETHIND (MYFEMBREE) 40-1-0.5 MG TABS       ROSUVASTATIN (CRESTOR) 20 MG TABLET    Take 20 mg by mouth at bedtime.   SODIUM FLUORIDE  (PREVIDENT  5000 PLUS) 1.1 % CREA DENTAL CREAM    Use pea size of the toothpaste and brush every evening.Spit out excess and do NOT rinse with water.   TOPIRAMATE  (TOPAMAX ) 25 MG CAPSULE    TAKE 1 TABLET(25 MG) BY MOUTH DAILY FOR 7 DAYS THEN TAKE 2 TABLETS(50 MG) BY MOUTH DAILY   VALACYCLOVIR  (VALTREX ) 1000 MG TABLET    TAKE 1 TABLET(1000 MG) BY MOUTH DAILY   VORTIOXETINE HBR (TRINTELLIX) 10 MG TABS TABLET    Take 10 mg by mouth daily.  VRAYLAR 1.5 MG CAPSULE    Take 1.5 mg by mouth daily.  Modified Medications   No medications on file  Discontinued Medications   No medications on file      Past Medical History:  Diagnosis Date   Allergy    Anemia    Anxiety    Anxiety state, unspecified 12/07/2012   Arthritis    Asthma    Bipolar disorder, unspecified (HCC) 12/07/2012   Depression    Diabetes mellitus without complication (HCC)    no meds currently- controlled with diet   GERD (gastroesophageal reflux disease)    GERD (gastroesophageal reflux disease) 12/07/2012   Headache(784.0)    migraines   Herpes genitalis in women 12/07/2012   HIV infection (HCC)    Human immunodeficiency virus (HIV) disease (HCC) 12/07/2012   Neuromuscular disorder (HCC)    carpal tunnel in both arms   SHINGLES 01/31/2009   Qualifier: Diagnosis of  By: Janna MD, Burnard     Type II or unspecified type diabetes mellitus without mention of complication, not stated as uncontrolled 12/07/2012    Unspecified asthma(493.90) 12/07/2012    Social History   Tobacco Use   Smoking status: Never   Smokeless tobacco: Never  Vaping Use   Vaping status: Never Used  Substance Use Topics   Alcohol  use: Yes    Alcohol /week: 0.0 standard drinks of alcohol     Comment: OCC.about once a month    Drug use: No    Family History  Problem Relation Age of Onset   Arthritis/Rheumatoid Mother    Hypertension Mother    Arthritis/Rheumatoid Father    Cancer Maternal Grandmother        breasts   Cancer Paternal Grandmother        breast and kidney   Cancer Maternal Aunt        brand and lung    Allergies  Allergen Reactions   Latuda  [Lurasidone  Hcl] Other (See Comments)    Occular dystonia   Seroquel [Quetiapine Fumarate] Swelling    Tongue swells    Sulfur Dioxide Itching and Other (See Comments)   Benzoin Other (See Comments)    blisters   Latex Rash and Other (See Comments)   Sulfamethoxazole-Trimethoprim Itching and Nausea And Vomiting    Review of Systems  Constitutional: Negative.   Gastrointestinal: Negative.      OBJECTIVE:    There were no vitals filed for this visit.     There is no height or weight on file to calculate BMI.  Physical Exam Constitutional:      Appearance: Normal appearance.  Eyes:     Extraocular Movements: Extraocular movements intact.     Conjunctiva/sclera: Conjunctivae normal.  Pulmonary:     Effort: Pulmonary effort is normal. No respiratory distress.  Skin:    General: Skin is warm and dry.  Neurological:     General: No focal deficit present.     Mental Status: She is alert and oriented to person, place, and time.  Psychiatric:        Behavior: Behavior normal.     Labs and Microbiology:    Latest Ref Rng & Units 08/20/2021    4:05 PM 07/17/2021    3:31 PM 02/06/2021   10:13 AM  CMP  Glucose 65 - 99 mg/dL 83  886  877   BUN 7 - 25 mg/dL 21  18  17    Creatinine 0.50 - 0.99 mg/dL 8.92  9.02  8.90   Sodium 135 -  146 mmol/L 140   140  136   Potassium 3.5 - 5.3 mmol/L 4.3  4.2  4.6   Chloride 98 - 110 mmol/L 103  103  103   CO2 20 - 32 mmol/L 30  24  26    Calcium 8.6 - 10.2 mg/dL 9.7  9.5  9.3   Total Protein 6.1 - 8.1 g/dL 7.0  6.4  6.7   Total Bilirubin 0.2 - 1.2 mg/dL 0.5  0.4  0.5   Alkaline Phos 44 - 121 IU/L  82    AST 10 - 30 U/L 17  16  16    ALT 6 - 29 U/L 18  18  16        Latest Ref Rng & Units 08/23/2022    9:33 AM 08/20/2021    4:05 PM 07/17/2021    3:31 PM  CBC  WBC 3.4 - 10.8 x10E3/uL 10.8  9.6  8.8   Hemoglobin 11.1 - 15.9 g/dL 85.0  85.6  86.3   Hematocrit 34.0 - 46.6 % 46.2  41.8  39.2   Platelets 150 - 450 x10E3/uL 365  339  340      Lab Results  Component Value Date   HIV1RNAQUANT NOT DETECTED 04/18/2024   HIV1RNAQUANT 22 (H) 10/18/2023   HIV1RNAQUANT Not Detected 06/22/2023   CD4TABS 1,044 04/18/2024   CD4TABS 1,729 12/23/2022   CD4TABS 1,061 02/23/2022    RPR and STI: Lab Results  Component Value Date   LABRPR NON-REACTIVE 02/23/2022   LABRPR NON-REACTIVE 08/20/2021   LABRPR NON-REACTIVE 02/06/2021   LABRPR NON-REACTIVE 05/15/2020   LABRPR NON-REACTIVE 08/28/2019    STI Results GC GC CT CT  08/21/2021 10:57 AM Negative   Negative    02/06/2021 10:33 AM Negative   Negative    05/16/2020 10:07 AM Negative   Negative    08/28/2019  4:04 PM Negative   Negative    09/21/2018 12:00 AM Negative   Negative    06/27/2018 12:00 AM Negative   Negative    01/02/2018 12:00 AM Negative   Negative    10/27/2017 12:00 AM Negative   Negative    08/03/2017 12:00 AM Negative   Negative    04/06/2017 12:00 AM Negative   Negative    08/13/2016 12:00 AM Negative   Negative    02/06/2016 12:00 AM Negative   Negative    11/05/2015 12:00 AM Negative   Negative    06/06/2015 12:00 AM Negative   Negative    12/26/2014 12:00 AM Negative   Negative    02/12/2013 11:13 AM  NEGATIVE   NEGATIVE   06/21/2010 12:52 AM   NEGATIVE (NOTE)  Testing performed using the BD ProbeTec Qx Chlamydia  trachomatis and Neisseria gonorrhea amplified DNA assay.  Performed at:  [facility]               [address]                [place], [place] [address]               [id number]    07/12/2007  8:56 PM   NEGATIVE (NOTE)  Testing performed using the BD Probetec ET Chlamydia trachomatis and Neisseria gonorrhea amplified DNA assay.      Hepatitis B: Lab Results  Component Value Date   HEPBSAB REACTIVE (A) 08/03/2017   HEPBSAG NEG 11/10/2006  HEPBCAB NEG 11/10/2006   Hepatitis C: Lab Results  Component Value Date   HEPCAB NON-REACTIVE 02/23/2022   Hepatitis A: Lab Results  Component Value Date   HAV REACTIVE (A) 01/01/2022   Lipids: Lab Results  Component Value Date   CHOL 205 (H) 04/18/2024   TRIG 152 (H) 04/18/2024   HDL 37 (L) 04/18/2024   CHOLHDL 5.5 (H) 04/18/2024   VLDL 25 04/06/2017   LDLCALC 140 (H) 04/18/2024      ASSESSMENT & PLAN:    No problem-specific Assessment & Plan notes found for this encounter.  #hiv ?dx'ed 2008 obgyn Heterosexual transmission Cd4 nadir <10; oi thrush Therapy: Lamivudine/tenofovir /isentress  2013 --> Tivicay /truvada /prezista -norvir  --> Genvoya/prezista  --> biktarvy  --> cabenuva   Patient has been tolerating cabenuva  q2months; last injection 06/22/23, a little late for this injection. Missed pharmacy visit a week prior  Preivous labs have shown great virologic/immunologic control Lab Results  Component Value Date   HIV1RNAQUANT NOT DETECTED 04/18/2024   Lab Results  Component Value Date   CD4TCELL 40 04/18/2024   CD4TABS 1,044 04/18/2024   -------- 06/20/24 id clinic assessment. Continues to be well controlled on cabenuva  based on recent labs.  No labs today and will do 10/2024  -discussed u=u -encourage compliance -continue current HIV medication cabenuva  -labs 10/2024 -f/u in 12 months with me; continue to f/u q2 months with our pharmacy team for ( a viral load  alone can be repeated on next pharmacy visit) cabenuva  -cabenuva  injection administered today   #bipolar (in chart) but patient seen psychiatry before and not bipolar #anxiety depression #adhd -doing well with behavior therapy; her edwin seems to help with depression -f/u psychiatry for ongoing management   #weight gain [place] she is active at work on her feet all day Tsh normal  -will refer to nutrition/wellness   #asthma #allergic rhinitis -mild intermittent  -continue albuterol , ryaltris , singulair , fluticasone /salmeterol inhaler   #hx right oophorectomy and partial hysterectomy (can still conceive) #oral birth control use On ocp for adenomyosis   #hcm -vaccination S/p hep b non-conj vaccine by 2016 S/p meningococcal booster 2023 S/p polysacharide pneumococcal vaccine 2023 -- offer conj vaccine prevnar 20 today 04/20/23 Flu shot -- patient said she wants flu shot but not on days of her work because she usually feel bad; she'll schedule nurse visit to do that on her off day -hepatitis S/p 3 doses recombinant vaccine (non-adjuvant) in 2016  06/20/24 hep b sAb titer today and hep c ab testing -std screening Patient to do with primary care/gyn at her desire -tb screening 2023 quantiferon gold negative -cancer screening F/u pcp for age appropriate cancer screening Hx abnormal pap 2014 needing colposcopy (negative); last pap 2018 negative   Constance DASEN [facility] 06/20/2024, 11:16 AM

## 2024-06-21 ENCOUNTER — Encounter: Admitting: Internal Medicine

## 2024-06-21 LAB — HEPATITIS C ANTIBODY: Hepatitis C Ab: NONREACTIVE

## 2024-06-21 LAB — HEPATITIS B SURFACE ANTIBODY, QUANTITATIVE: Hep B S AB Quant (Post): 43 m[IU]/mL (ref >=10)

## 2024-06-22 ENCOUNTER — Other Ambulatory Visit: Payer: Self-pay | Admitting: Internal Medicine

## 2024-06-22 DIAGNOSIS — B20 Human immunodeficiency virus [HIV] disease: Secondary | ICD-10-CM

## 2024-06-22 DIAGNOSIS — B009 Herpesviral infection, unspecified: Secondary | ICD-10-CM

## 2024-08-01 ENCOUNTER — Ambulatory Visit

## 2024-08-01 ENCOUNTER — Other Ambulatory Visit: Payer: Self-pay

## 2024-08-03 NOTE — Progress Notes (Signed)
 "  HPI: [name] is a 43 y.o. female who presents to the [facility] pharmacy clinic for Cabenuva  administration.  Referring ID Physician: Dr. Overton  Patient Active Problem List   Diagnosis Date Noted   Thrush 07/27/2022   Insomnia 10/15/2021   Not well controlled moderate persistent asthma 09/28/2021   Seasonal and perennial allergic rhinoconjunctivitis 09/28/2021   Other atopic dermatitis 07/20/2021   Hyperglycemia 08/28/2019   Obesity (BMI 35.0-39.9 without comorbidity) 07/26/2019   Costochondritis, acute 01/02/2018   Chronic tension headaches 01/23/2017   Hyperlipidemia 11/17/2015   Frequent sinus infections 06/30/2015   Hernia, abdominal 08/05/2014   LGSIL of cervix of undetermined significance 10/24/2013   Superficial Liver masses, right lobe; suspected dermoid recurrence.   05/07/2013   Ovarian cystic mass 05/07/2013   s/p Serosal repair of colon 12/04/2012 12/15/2012   Herpes genitalis in women 12/07/2012   Anxiety state 12/07/2012   GERD (gastroesophageal reflux disease) 12/07/2012   Generalized anxiety disorder 12/07/2012   Pelvic mass in female s/p Robotic-assisted right salpingo-oophorectomy 12/04/2012   Hemorrhoids 12/07/2006   Depression 11/25/2006   Other allergic rhinitis 11/25/2006   ASTHMA, COUGH VARIANT 11/15/2006   Human immunodeficiency virus (HIV) disease (HCC) 11/10/2006   Urticaria 11/10/2006    Patient's Medications  New Prescriptions   No medications on file  Previous Medications   ALBUTEROL  (PROVENTIL ) (2.5 MG/3ML) 0.083% NEBULIZER SOLUTION    Take 3 mLs (2.5 mg total) by nebulization every 4 (four) hours as needed for wheezing or shortness of breath (coughing fits).   ALBUTEROL  (VENTOLIN  HFA) 108 (90 BASE) MCG/ACT INHALER    Inhale 2 puffs into the lungs every 4 (four) hours as needed for wheezing or shortness of breath (coughing fits).   ATOMOXETINE (STRATTERA) 80 MG CAPSULE    Take 80 mg by mouth daily.   CABOTEGRAVIR  & RILPIVIRINE  ER (CABENUVA )  600 & 900 MG/3ML INJECTION    Inject 1 kit into the muscle every 2 (two) months.   CETIRIZINE  (ZYRTEC ) 10 MG TABLET    Take 2 tablets (20 mg total) by mouth at bedtime.   CLONIDINE (CATAPRES - DOSED IN MG/24 HR) 0.1 MG/24HR PATCH    Place 0.1 mg onto the skin once a week.   DENTA 5000 PLUS  1.1 % CREA DENTAL CREAM       FLUOCINOLONE ACETONIDE BODY 0.01 % OIL    Apply to scalp once daily as needed for itching/rash.   FLUTICASONE  (FLONASE ) 50 MCG/ACT NASAL SPRAY    Place 1 spray into both nostrils daily.   FLUTICASONE -SALMETEROL,SENSOR, (AIRDUO DIGIHALER ) 232-14 MCG/ACT AEPB    Inhale 1 puff into the lungs in the morning and at bedtime. Rinse mouth after each use.   HYDROCORTISONE  2.5 % CREAM    Place rectally.   KETOCONAZOLE (NIZORAL) 2 % CREAM       METHOCARBAMOL  (ROBAXIN ) 500 MG TABLET    Take 1 tablet (500 mg total) by mouth 2 (two) times daily.   MONTELUKAST  (SINGULAIR ) 10 MG TABLET    TAKE 1 TABLET BY MOUTH AT BEDTIME   NORETHINDRONE  (AYGESTIN ) 5 MG TABLET    Take 10 mg by mouth daily.   OLOPATADINE-MOMETASONE  (RYALTRIS ) 665-25 MCG/ACT SUSP    Place 1-2 sprays into the nose in the morning and at bedtime.   OMEPRAZOLE  (PRILOSEC) 40 MG CAPSULE    Take 1 capsule (40 mg total) by mouth daily. Nothing to eat or drink for 20-30 minutes.   OXYMETAZOLINE (AFRIN) 0.05 % NASAL SPRAY    Place  1 spray into both nostrils 2 (two) times daily. For <7 days at a time.   PREGABALIN (LYRICA) 25 MG CAPSULE    Take by mouth.   RELUGOLIX-ESTRADIOL-NORETHIND (MYFEMBREE) 40-1-0.5 MG TABS       ROSUVASTATIN (CRESTOR) 20 MG TABLET    Take 20 mg by mouth at bedtime.   SODIUM FLUORIDE  (PREVIDENT 5000 PLUS) 1.1 % CREA DENTAL CREAM    Use pea size of the toothpaste and brush every evening.Spit out excess and do NOT rinse with water.   TOPIRAMATE  (TOPAMAX ) 25 MG CAPSULE    TAKE 1 TABLET(25 MG) BY MOUTH DAILY FOR 7 DAYS THEN TAKE 2 TABLETS(50 MG) BY MOUTH DAILY   VALACYCLOVIR  (VALTREX ) 1000 MG TABLET    TAKE 1 TABLET(1000  MG) BY MOUTH DAILY   VORTIOXETINE HBR (TRINTELLIX) 10 MG TABS TABLET    Take 10 mg by mouth daily.   VRAYLAR 1.5 MG CAPSULE    Take 1.5 mg by mouth daily.  Modified Medications   No medications on file  Discontinued Medications   No medications on file    Allergies: Allergies[1]  Past Medical History: Past Medical History:  Diagnosis Date   Allergy    Anemia    Anxiety    Anxiety state, unspecified 12/07/2012   Arthritis    Asthma    Bipolar disorder, unspecified (HCC) 12/07/2012   Depression    Diabetes mellitus without complication (HCC)    no meds currently- controlled with diet   GERD (gastroesophageal reflux disease)    GERD (gastroesophageal reflux disease) 12/07/2012   Headache(784.0)    migraines   Herpes genitalis in women 12/07/2012   HIV infection (HCC)    Human immunodeficiency virus (HIV) disease (HCC) 12/07/2012   Neuromuscular disorder (HCC)    carpal tunnel in both arms   SHINGLES 01/31/2009   Qualifier: Diagnosis of  By: Janna MD, Burnard     Type II or unspecified type diabetes mellitus without mention of complication, not stated as uncontrolled 12/07/2012   Unspecified asthma(493.90) 12/07/2012    Social History: Social History   Socioeconomic History   Marital status: Single    Spouse name: Not on file   Number of children: 1   Years of education: 12   Highest education level: Some college, no degree  Occupational History   Occupation: Nutrition Chief Financial Officer: [employer]  Tobacco Use   Smoking status: Never   Smokeless tobacco: Never  Vaping Use   Vaping status: Never Used  Substance and Sexual Activity   Alcohol use: Yes    Alcohol/week: 0.0 standard drinks of alcohol    Comment: OCC.about once a month    Drug use: No   Sexual activity: Never    Partners: Male    Birth control/protection: Implant    Comment: DECLINED CONDOMS  Other Topics Concern   Not on file  Social History Narrative   Patient lives alone.    Patient works at home.   Patient has hs education.   Patient has 1 child.   Patient drinks multiple energy drinks and coffees throughout day, daily.      Patient is right-handed. She lives with her daughter in a one level home. She drinks one cup of coffee a day. She works for [employer] at [employer] in [place] and is very active at work.   Social Drivers of Health   Tobacco Use: Low Risk (07/06/2024)   Received from [facility]   Patient  History    Smoking Tobacco Use: Never    Smokeless Tobacco Use: Never    Passive Exposure: Never  Financial Resource Strain: Not on file  Food Insecurity: Low Risk (02/28/2024)   Received from [facility]   Epic    Within the past 12 months, you worried that your food would run out before you got money to buy more: Never true    Within the past 12 months, the food you bought just didn't last and you didn't have money to get more. : Never true  Transportation Needs: No Transportation Needs (02/28/2024)   Received from [facility]    In the past 12 months, has lack of reliable transportation kept you from medical appointments, meetings, work or from getting things needed for daily living? : No  Physical Activity: Not on file  Stress: Not on file  Social Connections: Not on file  Depression (PHQ2-9): Low Risk (06/20/2024)   Depression (PHQ2-9)    PHQ-2 Score: 0  Alcohol Screen: Not on file  Housing: Low Risk (03/20/2024)   Received from [facility]   Epic    What is your living situation today?: I have a steady place to live    Think about the place you live. Do you have problems with any of the following? Choose all that apply:: None/None on this list  Recent Concern: Housing - Medium Risk (02/28/2024)   Received from [facility]   Epic    What is your living situation today?: I have a place to live today, but I am worried about losing it in the future    Think about the place you live. Do you have problems  with any of the following? Choose all that apply:: None/None on this list  Utilities: Medium Risk (02/28/2024)   Received from [facility]   Utilities    In the past 12 months has the electric, gas, oil, or water company threatened to shut off services in your home? : Yes  Health Literacy: Not on file    Labs: Lab Results  Component Value Date   HIV1RNAQUANT NOT DETECTED 04/18/2024   HIV1RNAQUANT 22 (H) 10/18/2023   HIV1RNAQUANT Not Detected 06/22/2023   CD4TABS 1,044 04/18/2024   CD4TABS 1,729 12/23/2022   CD4TABS 1,061 02/23/2022    RPR and STI Lab Results  Component Value Date   LABRPR NON-REACTIVE 02/23/2022   LABRPR NON-REACTIVE 08/20/2021   LABRPR NON-REACTIVE 02/06/2021   LABRPR NON-REACTIVE 05/15/2020   LABRPR NON-REACTIVE 08/28/2019    STI Results GC GC CT CT  08/21/2021 10:57 AM Negative   Negative    02/06/2021 10:33 AM Negative   Negative    05/16/2020 10:07 AM Negative   Negative    08/28/2019  4:04 PM Negative   Negative    09/21/2018 12:00 AM Negative   Negative    06/27/2018 12:00 AM Negative   Negative    01/02/2018 12:00 AM Negative   Negative    10/27/2017 12:00 AM Negative   Negative    08/03/2017 12:00 AM Negative   Negative    04/06/2017 12:00 AM Negative   Negative    08/13/2016 12:00 AM Negative   Negative    02/06/2016 12:00 AM Negative   Negative    11/05/2015 12:00 AM Negative   Negative    06/06/2015 12:00 AM Negative   Negative    12/26/2014 12:00 AM Negative   Negative    02/12/2013 11:13 AM  NEGATIVE   NEGATIVE  06/21/2010 12:52 AM   NEGATIVE (NOTE)  Testing performed using the BD ProbeTec Qx Chlamydia trachomatis and Neisseria gonorrhea amplified DNA assay.  Performed at:  [facility]               [address]                [place], [place] [address]               [id number]    07/12/2007  8:56 PM   NEGATIVE (NOTE)  Testing performed using the BD Probetec ET Chlamydia  trachomatis and Neisseria gonorrhea amplified DNA assay.      Hepatitis B Lab Results  Component Value Date   HEPBSAB REACTIVE (A) 08/03/2017   HEPBSAG NEG 11/10/2006   HEPBCAB NEG 11/10/2006   Hepatitis C Lab Results  Component Value Date   HEPCAB NON-REACTIVE 06/20/2024   Hepatitis A Lab Results  Component Value Date   HAV REACTIVE (A) 01/01/2022   Lipids: Lab Results  Component Value Date   CHOL 205 (H) 04/18/2024   TRIG 152 (H) 04/18/2024   HDL 37 (L) 04/18/2024   CHOLHDL 5.5 (H) 04/18/2024   VLDL 25 04/06/2017   LDLCALC 140 (H) 04/18/2024    TARGET DATE:  The 6th of the month  Assessment: [name] presents today for their maintenance Cabenuva  injections. Initial/past injections were tolerated well without issues. No problems with systemic effects of injections.   Administered cabotegravir  600mg /72mL in left upper outer quadrant of the gluteal muscle. Administered rilpivirine  900 mg/3mL in the right upper outer quadrant of the gluteal muscle. Monitored patient for 10 minutes after injection. Injections were tolerated well without issue. Patient will follow up in 2 months for next injection.  Eligible for flu vaccine which she accepts today.  Plan: - Administer Cabenuva  injections  - Next injections scheduled for 10/12/24 with me  - Administer flu vaccine  - Call with any issues or questions  Alan Geralds, PharmD, CPP, BCIDP, AAHIVP Clinical Pharmacist Practitioner Infectious Diseases Clinical Pharmacist [facility]      [1]  Allergies Allergen Reactions   Latuda  [Lurasidone  Hcl] Other (See Comments)    Occular dystonia   Seroquel [Quetiapine Fumarate] Swelling    Tongue swells    Sulfur Dioxide Itching and Other (See Comments)   Benzoin Other (See Comments)    blisters   Latex Rash and Other (See Comments)   Sulfamethoxazole-Trimethoprim Itching and Nausea And Vomiting   "

## 2024-08-14 ENCOUNTER — Other Ambulatory Visit: Payer: Self-pay

## 2024-08-14 ENCOUNTER — Ambulatory Visit (INDEPENDENT_AMBULATORY_CARE_PROVIDER_SITE_OTHER): Payer: Self-pay | Admitting: Pharmacist

## 2024-08-14 DIAGNOSIS — B20 Human immunodeficiency virus [HIV] disease: Secondary | ICD-10-CM

## 2024-08-14 DIAGNOSIS — Z23 Encounter for immunization: Secondary | ICD-10-CM

## 2024-08-14 MED ORDER — CABOTEGRAVIR & RILPIVIRINE ER 600 & 900 MG/3ML IM SUER
1.0000 | Freq: Once | INTRAMUSCULAR | Status: AC
  Administered 2024-08-14: 1 via INTRAMUSCULAR

## 2024-09-05 ENCOUNTER — Ambulatory Visit

## 2024-10-12 ENCOUNTER — Encounter: Payer: Self-pay | Admitting: Pharmacist
